# Patient Record
Sex: Female | Born: 1960 | Race: White | Hispanic: No | Marital: Married | Smoking: Former smoker
Health system: Southern US, Community
[De-identification: ages and names within clinical notes are randomized; demographics above are authoritative.]

## PROBLEM LIST (undated history)

## (undated) DIAGNOSIS — Z9221 Personal history of antineoplastic chemotherapy: Secondary | ICD-10-CM

## (undated) DIAGNOSIS — F419 Anxiety disorder, unspecified: Secondary | ICD-10-CM

## (undated) DIAGNOSIS — F4310 Post-traumatic stress disorder, unspecified: Secondary | ICD-10-CM

## (undated) DIAGNOSIS — E039 Hypothyroidism, unspecified: Secondary | ICD-10-CM

## (undated) DIAGNOSIS — Z923 Personal history of irradiation: Secondary | ICD-10-CM

## (undated) DIAGNOSIS — F329 Major depressive disorder, single episode, unspecified: Secondary | ICD-10-CM

## (undated) DIAGNOSIS — J449 Chronic obstructive pulmonary disease, unspecified: Secondary | ICD-10-CM

## (undated) DIAGNOSIS — R112 Nausea with vomiting, unspecified: Secondary | ICD-10-CM

## (undated) DIAGNOSIS — Z9889 Other specified postprocedural states: Secondary | ICD-10-CM

## (undated) DIAGNOSIS — E785 Hyperlipidemia, unspecified: Secondary | ICD-10-CM

## (undated) DIAGNOSIS — Z803 Family history of malignant neoplasm of breast: Secondary | ICD-10-CM

## (undated) DIAGNOSIS — Z800 Family history of malignant neoplasm of digestive organs: Secondary | ICD-10-CM

## (undated) HISTORY — DX: Family history of malignant neoplasm of digestive organs: Z80.0

## (undated) HISTORY — DX: Chronic obstructive pulmonary disease, unspecified: J44.9

## (undated) HISTORY — DX: Hyperlipidemia, unspecified: E78.5

## (undated) HISTORY — DX: Hypothyroidism, unspecified: E03.9

## (undated) HISTORY — DX: Post-traumatic stress disorder, unspecified: F43.10

## (undated) HISTORY — PX: BREAST LUMPECTOMY: SHX2

## (undated) HISTORY — DX: Anxiety disorder, unspecified: F41.9

## (undated) HISTORY — DX: Family history of malignant neoplasm of breast: Z80.3

## (undated) HISTORY — DX: Major depressive disorder, single episode, unspecified: F32.9

## (undated) HISTORY — PX: APPENDECTOMY: SHX54

---

## 1996-01-14 DIAGNOSIS — E039 Hypothyroidism, unspecified: Secondary | ICD-10-CM

## 1996-01-14 HISTORY — DX: Hypothyroidism, unspecified: E03.9

## 1996-10-13 DIAGNOSIS — F329 Major depressive disorder, single episode, unspecified: Secondary | ICD-10-CM

## 1996-10-13 DIAGNOSIS — F419 Anxiety disorder, unspecified: Secondary | ICD-10-CM

## 1996-10-13 DIAGNOSIS — F32A Depression, unspecified: Secondary | ICD-10-CM

## 1996-10-13 HISTORY — DX: Anxiety disorder, unspecified: F41.9

## 1996-10-13 HISTORY — DX: Major depressive disorder, single episode, unspecified: F32.9

## 1996-10-13 HISTORY — DX: Depression, unspecified: F32.A

## 1998-09-15 DIAGNOSIS — E785 Hyperlipidemia, unspecified: Secondary | ICD-10-CM

## 1998-09-15 HISTORY — DX: Hyperlipidemia, unspecified: E78.5

## 1999-10-14 ENCOUNTER — Encounter: Payer: Self-pay | Admitting: Family Medicine

## 1999-10-14 LAB — CONVERTED CEMR LAB: Pap Smear: NORMAL

## 1999-11-04 ENCOUNTER — Other Ambulatory Visit: Admission: RE | Admit: 1999-11-04 | Discharge: 1999-11-04 | Payer: Self-pay | Admitting: Family Medicine

## 2001-01-13 ENCOUNTER — Encounter: Payer: Self-pay | Admitting: Family Medicine

## 2001-01-13 LAB — CONVERTED CEMR LAB: Pap Smear: NORMAL

## 2001-01-15 ENCOUNTER — Other Ambulatory Visit: Admission: RE | Admit: 2001-01-15 | Discharge: 2001-01-15 | Payer: Self-pay | Admitting: Family Medicine

## 2002-01-13 ENCOUNTER — Encounter: Payer: Self-pay | Admitting: Family Medicine

## 2002-01-13 LAB — CONVERTED CEMR LAB: Pap Smear: NORMAL

## 2002-01-17 ENCOUNTER — Other Ambulatory Visit: Admission: RE | Admit: 2002-01-17 | Discharge: 2002-01-17 | Payer: Self-pay | Admitting: Family Medicine

## 2003-03-16 ENCOUNTER — Encounter: Payer: Self-pay | Admitting: Family Medicine

## 2003-03-16 LAB — HM MAMMOGRAPHY: HM Mammogram: NORMAL

## 2003-03-16 LAB — CONVERTED CEMR LAB: Pap Smear: NORMAL

## 2003-03-26 ENCOUNTER — Other Ambulatory Visit: Admission: RE | Admit: 2003-03-26 | Discharge: 2003-03-26 | Payer: Self-pay | Admitting: Family Medicine

## 2004-04-15 ENCOUNTER — Encounter: Payer: Self-pay | Admitting: Family Medicine

## 2004-04-15 LAB — CONVERTED CEMR LAB: Pap Smear: NORMAL

## 2004-04-21 ENCOUNTER — Other Ambulatory Visit: Admission: RE | Admit: 2004-04-21 | Discharge: 2004-04-21 | Payer: Self-pay | Admitting: Family Medicine

## 2004-10-19 ENCOUNTER — Ambulatory Visit: Payer: Self-pay | Admitting: Family Medicine

## 2005-04-21 ENCOUNTER — Ambulatory Visit: Payer: Self-pay | Admitting: Family Medicine

## 2005-08-12 ENCOUNTER — Ambulatory Visit: Payer: Self-pay | Admitting: Family Medicine

## 2005-08-22 ENCOUNTER — Ambulatory Visit: Payer: Self-pay | Admitting: Family Medicine

## 2005-09-14 ENCOUNTER — Ambulatory Visit: Payer: Self-pay | Admitting: Family Medicine

## 2005-09-15 DIAGNOSIS — J449 Chronic obstructive pulmonary disease, unspecified: Secondary | ICD-10-CM

## 2005-09-15 HISTORY — DX: Chronic obstructive pulmonary disease, unspecified: J44.9

## 2005-09-26 ENCOUNTER — Ambulatory Visit: Payer: Self-pay | Admitting: Family Medicine

## 2005-10-10 ENCOUNTER — Ambulatory Visit: Payer: Self-pay | Admitting: Family Medicine

## 2005-10-10 ENCOUNTER — Other Ambulatory Visit: Admission: RE | Admit: 2005-10-10 | Discharge: 2005-10-10 | Payer: Self-pay | Admitting: Family Medicine

## 2005-10-10 ENCOUNTER — Encounter: Payer: Self-pay | Admitting: Family Medicine

## 2005-10-13 ENCOUNTER — Encounter: Payer: Self-pay | Admitting: Family Medicine

## 2005-10-13 LAB — CONVERTED CEMR LAB: Pap Smear: NORMAL

## 2006-03-21 ENCOUNTER — Ambulatory Visit: Payer: Self-pay | Admitting: Family Medicine

## 2006-06-14 ENCOUNTER — Ambulatory Visit: Payer: Self-pay | Admitting: Family Medicine

## 2006-06-29 ENCOUNTER — Ambulatory Visit: Payer: Self-pay | Admitting: Family Medicine

## 2006-07-26 ENCOUNTER — Ambulatory Visit: Payer: Self-pay | Admitting: Family Medicine

## 2006-10-17 ENCOUNTER — Ambulatory Visit: Payer: Self-pay | Admitting: Family Medicine

## 2006-11-07 ENCOUNTER — Encounter: Payer: Self-pay | Admitting: Family Medicine

## 2006-11-07 DIAGNOSIS — F329 Major depressive disorder, single episode, unspecified: Secondary | ICD-10-CM | POA: Insufficient documentation

## 2006-11-07 DIAGNOSIS — E039 Hypothyroidism, unspecified: Secondary | ICD-10-CM | POA: Insufficient documentation

## 2006-11-07 DIAGNOSIS — J449 Chronic obstructive pulmonary disease, unspecified: Secondary | ICD-10-CM | POA: Insufficient documentation

## 2006-11-07 DIAGNOSIS — R519 Headache, unspecified: Secondary | ICD-10-CM | POA: Insufficient documentation

## 2006-11-07 DIAGNOSIS — E785 Hyperlipidemia, unspecified: Secondary | ICD-10-CM | POA: Insufficient documentation

## 2006-11-07 DIAGNOSIS — F411 Generalized anxiety disorder: Secondary | ICD-10-CM | POA: Insufficient documentation

## 2006-11-07 DIAGNOSIS — J4489 Other specified chronic obstructive pulmonary disease: Secondary | ICD-10-CM | POA: Insufficient documentation

## 2006-11-07 DIAGNOSIS — R51 Headache: Secondary | ICD-10-CM

## 2006-11-07 DIAGNOSIS — F32A Depression, unspecified: Secondary | ICD-10-CM | POA: Insufficient documentation

## 2007-01-01 ENCOUNTER — Telehealth (INDEPENDENT_AMBULATORY_CARE_PROVIDER_SITE_OTHER): Payer: Self-pay | Admitting: *Deleted

## 2007-03-25 ENCOUNTER — Inpatient Hospital Stay (HOSPITAL_COMMUNITY): Admission: EM | Admit: 2007-03-25 | Discharge: 2007-03-26 | Payer: Self-pay | Admitting: Emergency Medicine

## 2007-03-25 ENCOUNTER — Ambulatory Visit: Payer: Self-pay | Admitting: Internal Medicine

## 2007-03-26 ENCOUNTER — Encounter: Payer: Self-pay | Admitting: Family Medicine

## 2007-03-29 ENCOUNTER — Encounter (INDEPENDENT_AMBULATORY_CARE_PROVIDER_SITE_OTHER): Payer: Self-pay | Admitting: *Deleted

## 2007-03-29 ENCOUNTER — Telehealth (INDEPENDENT_AMBULATORY_CARE_PROVIDER_SITE_OTHER): Payer: Self-pay | Admitting: Internal Medicine

## 2007-06-18 ENCOUNTER — Telehealth: Payer: Self-pay | Admitting: Family Medicine

## 2007-06-21 ENCOUNTER — Telehealth: Payer: Self-pay | Admitting: Family Medicine

## 2007-06-28 ENCOUNTER — Ambulatory Visit: Payer: Self-pay | Admitting: Family Medicine

## 2007-07-04 LAB — CONVERTED CEMR LAB
ALT: 17 units/L (ref 0–35)
AST: 26 units/L (ref 0–37)
Albumin: 4.2 g/dL (ref 3.5–5.2)
Alkaline Phosphatase: 88 units/L (ref 39–117)
BUN: 10 mg/dL (ref 6–23)
Basophils Absolute: 0.1 10*3/uL (ref 0.0–0.1)
Basophils Relative: 1 % (ref 0–1)
CO2: 28 meq/L (ref 19–32)
Calcium: 9.2 mg/dL (ref 8.4–10.5)
Chloride: 105 meq/L (ref 96–112)
Creatinine, Ser: 0.8 mg/dL (ref 0.40–1.20)
Eosinophils Absolute: 0.1 10*3/uL — ABNORMAL LOW (ref 0.2–0.7)
Eosinophils Relative: 1 % (ref 0–5)
FSH: 31.1 milliintl units/mL
Folate: 17.2 ng/mL
Free T4: 1.13 ng/dL (ref 0.89–1.80)
Glucose, Bld: 72 mg/dL (ref 70–99)
HCT: 41.8 % (ref 36.0–46.0)
Hemoglobin: 13.8 g/dL (ref 12.0–15.0)
Iron: 45 ug/dL (ref 42–145)
LH: 13.1 milliintl units/mL
Lymphocytes Relative: 35 % (ref 12–46)
Lymphs Abs: 1.9 10*3/uL (ref 0.7–4.0)
MCHC: 33 g/dL (ref 30.0–36.0)
MCV: 95.2 fL (ref 78.0–100.0)
Monocytes Absolute: 0.3 10*3/uL (ref 0.1–1.0)
Monocytes Relative: 6 % (ref 3–12)
Neutro Abs: 3.2 10*3/uL (ref 1.7–7.7)
Neutrophils Relative %: 57 % (ref 43–77)
Platelets: 279 10*3/uL (ref 150–400)
Potassium: 4.4 meq/L (ref 3.5–5.3)
Prolactin: 3.7 ng/mL
RBC: 4.39 M/uL (ref 3.87–5.11)
RDW: 12.5 % (ref 11.5–15.5)
Sodium: 141 meq/L (ref 135–145)
T3, Free: 2.8 pg/mL (ref 2.3–4.2)
TSH: 2.091 microintl units/mL (ref 0.350–5.50)
Total Bilirubin: 0.2 mg/dL — ABNORMAL LOW (ref 0.3–1.2)
Total Protein: 7.3 g/dL (ref 6.0–8.3)
Vitamin B-12: 441 pg/mL (ref 211–911)
WBC: 5.5 10*3/uL (ref 4.0–10.5)

## 2007-08-13 ENCOUNTER — Telehealth: Payer: Self-pay | Admitting: Family Medicine

## 2007-08-21 ENCOUNTER — Telehealth: Payer: Self-pay | Admitting: Family Medicine

## 2007-09-18 ENCOUNTER — Telehealth: Payer: Self-pay | Admitting: Family Medicine

## 2007-10-11 ENCOUNTER — Telehealth: Payer: Self-pay | Admitting: Family Medicine

## 2007-11-05 ENCOUNTER — Telehealth: Payer: Self-pay | Admitting: Family Medicine

## 2007-11-30 ENCOUNTER — Telehealth: Payer: Self-pay | Admitting: Family Medicine

## 2007-12-11 ENCOUNTER — Encounter: Payer: Self-pay | Admitting: Family Medicine

## 2007-12-11 ENCOUNTER — Ambulatory Visit: Payer: Self-pay | Admitting: Family Medicine

## 2007-12-11 ENCOUNTER — Other Ambulatory Visit: Admission: RE | Admit: 2007-12-11 | Discharge: 2007-12-11 | Payer: Self-pay | Admitting: Family Medicine

## 2007-12-11 LAB — CONVERTED CEMR LAB: Pap Smear: NORMAL

## 2007-12-12 LAB — CONVERTED CEMR LAB
ALT: 16 units/L (ref 0–35)
AST: 21 units/L (ref 0–37)
Albumin: 4.2 g/dL (ref 3.5–5.2)
Alkaline Phosphatase: 81 units/L (ref 39–117)
BUN: 10 mg/dL (ref 6–23)
Bilirubin, Direct: 0.1 mg/dL (ref 0.0–0.3)
CO2: 22 meq/L (ref 19–32)
Calcium: 8.9 mg/dL (ref 8.4–10.5)
Chloride: 108 meq/L (ref 96–112)
Cholesterol: 183 mg/dL (ref 0–200)
Creatinine, Ser: 0.66 mg/dL (ref 0.40–1.20)
Glucose, Bld: 79 mg/dL (ref 70–99)
HDL: 59 mg/dL (ref >39)
Indirect Bilirubin: 0.3 mg/dL (ref 0.0–0.9)
LDL Cholesterol: 110 mg/dL — ABNORMAL HIGH (ref 0–99)
Potassium: 4 meq/L (ref 3.5–5.3)
Sodium: 141 meq/L (ref 135–145)
TSH: 0.616 microintl units/mL (ref 0.350–5.50)
Total Bilirubin: 0.4 mg/dL (ref 0.3–1.2)
Total CHOL/HDL Ratio: 3.1
Total Protein: 6.9 g/dL (ref 6.0–8.3)
Triglycerides: 72 mg/dL (ref <150)
VLDL: 14 mg/dL (ref 0–40)

## 2007-12-17 ENCOUNTER — Encounter (INDEPENDENT_AMBULATORY_CARE_PROVIDER_SITE_OTHER): Payer: Self-pay | Admitting: *Deleted

## 2007-12-24 ENCOUNTER — Telehealth: Payer: Self-pay | Admitting: Family Medicine

## 2008-01-01 ENCOUNTER — Telehealth: Payer: Self-pay | Admitting: Family Medicine

## 2008-01-29 ENCOUNTER — Telehealth: Payer: Self-pay | Admitting: Family Medicine

## 2008-02-28 ENCOUNTER — Telehealth: Payer: Self-pay | Admitting: Family Medicine

## 2008-04-10 ENCOUNTER — Telehealth: Payer: Self-pay | Admitting: Internal Medicine

## 2008-05-01 ENCOUNTER — Telehealth: Payer: Self-pay | Admitting: Family Medicine

## 2008-05-09 ENCOUNTER — Telehealth: Payer: Self-pay | Admitting: Internal Medicine

## 2008-05-19 ENCOUNTER — Ambulatory Visit: Payer: Self-pay | Admitting: Family Medicine

## 2008-06-13 ENCOUNTER — Telehealth: Payer: Self-pay | Admitting: Internal Medicine

## 2008-06-17 ENCOUNTER — Telehealth: Payer: Self-pay | Admitting: Family Medicine

## 2008-07-01 ENCOUNTER — Telehealth: Payer: Self-pay | Admitting: Family Medicine

## 2008-07-16 ENCOUNTER — Telehealth: Payer: Self-pay | Admitting: Family Medicine

## 2008-08-19 ENCOUNTER — Telehealth: Payer: Self-pay | Admitting: Family Medicine

## 2008-09-10 ENCOUNTER — Telehealth: Payer: Self-pay | Admitting: Family Medicine

## 2008-09-18 ENCOUNTER — Telehealth: Payer: Self-pay | Admitting: Family Medicine

## 2008-10-17 ENCOUNTER — Telehealth: Payer: Self-pay | Admitting: Family Medicine

## 2008-10-24 ENCOUNTER — Telehealth: Payer: Self-pay | Admitting: Family Medicine

## 2008-11-04 ENCOUNTER — Ambulatory Visit: Payer: Self-pay | Admitting: Family Medicine

## 2008-11-04 DIAGNOSIS — N951 Menopausal and female climacteric states: Secondary | ICD-10-CM | POA: Insufficient documentation

## 2008-11-17 ENCOUNTER — Telehealth: Payer: Self-pay | Admitting: Family Medicine

## 2008-12-01 ENCOUNTER — Telehealth: Payer: Self-pay | Admitting: Family Medicine

## 2008-12-17 ENCOUNTER — Telehealth (INDEPENDENT_AMBULATORY_CARE_PROVIDER_SITE_OTHER): Payer: Self-pay | Admitting: *Deleted

## 2008-12-17 ENCOUNTER — Telehealth: Payer: Self-pay | Admitting: Family Medicine

## 2009-03-09 ENCOUNTER — Telehealth: Payer: Self-pay | Admitting: Family Medicine

## 2009-05-05 ENCOUNTER — Telehealth: Payer: Self-pay | Admitting: Family Medicine

## 2009-05-05 ENCOUNTER — Ambulatory Visit: Payer: Self-pay | Admitting: Family Medicine

## 2009-05-05 DIAGNOSIS — N390 Urinary tract infection, site not specified: Secondary | ICD-10-CM | POA: Insufficient documentation

## 2009-05-05 LAB — CONVERTED CEMR LAB
Bilirubin Urine: NEGATIVE
Glucose, Urine, Semiquant: NEGATIVE
Ketones, urine, test strip: NEGATIVE
Nitrite: NEGATIVE
Protein, U semiquant: 100
Specific Gravity, Urine: 1.02
Urobilinogen, UA: 0.2
pH: 6

## 2009-05-06 ENCOUNTER — Encounter: Payer: Self-pay | Admitting: Family Medicine

## 2009-05-07 ENCOUNTER — Ambulatory Visit: Payer: Self-pay | Admitting: Family Medicine

## 2009-05-07 ENCOUNTER — Other Ambulatory Visit: Admission: RE | Admit: 2009-05-07 | Discharge: 2009-05-07 | Payer: Self-pay | Admitting: Family Medicine

## 2009-05-07 ENCOUNTER — Encounter: Payer: Self-pay | Admitting: Family Medicine

## 2009-05-07 DIAGNOSIS — R55 Syncope and collapse: Secondary | ICD-10-CM | POA: Insufficient documentation

## 2009-05-07 LAB — HM PAP SMEAR

## 2009-05-07 LAB — CONVERTED CEMR LAB: Pap Smear: NORMAL

## 2009-05-11 LAB — CONVERTED CEMR LAB
ALT: 12 units/L (ref 0–35)
AST: 18 units/L (ref 0–37)
Albumin: 3.7 g/dL (ref 3.5–5.2)
Alkaline Phosphatase: 98 units/L (ref 39–117)
BUN: 12 mg/dL (ref 6–23)
Basophils Absolute: 0.1 10*3/uL (ref 0.0–0.1)
Basophils Relative: 1 % (ref 0.0–3.0)
Bilirubin, Direct: 0 mg/dL (ref 0.0–0.3)
CO2: 31 meq/L (ref 19–32)
Calcium: 9 mg/dL (ref 8.4–10.5)
Chloride: 103 meq/L (ref 96–112)
Cholesterol: 243 mg/dL — ABNORMAL HIGH (ref 0–200)
Creatinine, Ser: 0.8 mg/dL (ref 0.4–1.2)
Direct LDL: 161.1 mg/dL
Eosinophils Absolute: 0.1 10*3/uL (ref 0.0–0.7)
Eosinophils Relative: 1.6 % (ref 0.0–5.0)
Free T4: 0.8 ng/dL (ref 0.6–1.6)
GFR calc non Af Amer: 81.04 mL/min (ref >60)
Glucose, Bld: 82 mg/dL (ref 70–99)
HCT: 41 % (ref 36.0–46.0)
HDL: 61.1 mg/dL (ref >39.00)
Hemoglobin: 13.6 g/dL (ref 12.0–15.0)
Lymphocytes Relative: 23.3 % (ref 12.0–46.0)
Lymphs Abs: 1.7 10*3/uL (ref 0.7–4.0)
MCHC: 33.3 g/dL (ref 30.0–36.0)
MCV: 95.7 fL (ref 78.0–100.0)
Monocytes Absolute: 0.4 10*3/uL (ref 0.1–1.0)
Monocytes Relative: 5.9 % (ref 3.0–12.0)
Neutro Abs: 4.8 10*3/uL (ref 1.4–7.7)
Neutrophils Relative %: 68.2 % (ref 43.0–77.0)
Platelets: 309 10*3/uL (ref 150.0–400.0)
Potassium: 4.4 meq/L (ref 3.5–5.1)
RBC: 4.28 M/uL (ref 3.87–5.11)
RDW: 12.3 % (ref 11.5–14.6)
Sodium: 138 meq/L (ref 135–145)
TSH: 0.4 microintl units/mL (ref 0.35–5.50)
Total Bilirubin: 0.4 mg/dL (ref 0.3–1.2)
Total CHOL/HDL Ratio: 4
Total Protein: 6.9 g/dL (ref 6.0–8.3)
Triglycerides: 116 mg/dL (ref 0.0–149.0)
VLDL: 23.2 mg/dL (ref 0.0–40.0)
WBC: 7.1 10*3/uL (ref 4.5–10.5)

## 2009-05-18 ENCOUNTER — Telehealth: Payer: Self-pay | Admitting: Family Medicine

## 2009-05-19 ENCOUNTER — Telehealth: Payer: Self-pay | Admitting: Family Medicine

## 2009-06-04 ENCOUNTER — Telehealth: Payer: Self-pay | Admitting: Family Medicine

## 2009-06-11 ENCOUNTER — Telehealth: Payer: Self-pay | Admitting: Family Medicine

## 2009-06-19 ENCOUNTER — Telehealth: Payer: Self-pay | Admitting: Family Medicine

## 2009-06-22 ENCOUNTER — Telehealth: Payer: Self-pay | Admitting: Family Medicine

## 2009-06-23 ENCOUNTER — Encounter (INDEPENDENT_AMBULATORY_CARE_PROVIDER_SITE_OTHER): Payer: Self-pay | Admitting: *Deleted

## 2009-07-17 ENCOUNTER — Encounter: Payer: Self-pay | Admitting: Family Medicine

## 2009-08-28 ENCOUNTER — Encounter: Payer: Self-pay | Admitting: Family Medicine

## 2009-11-17 ENCOUNTER — Telehealth: Payer: Self-pay | Admitting: Family Medicine

## 2009-11-23 ENCOUNTER — Telehealth: Payer: Self-pay | Admitting: Family Medicine

## 2009-11-26 ENCOUNTER — Ambulatory Visit: Payer: Self-pay | Admitting: Family Medicine

## 2009-11-26 LAB — CONVERTED CEMR LAB
Bilirubin Urine: NEGATIVE
Blood in Urine, dipstick: NEGATIVE
Glucose, Urine, Semiquant: NEGATIVE
Ketones, urine, test strip: NEGATIVE
Nitrite: NEGATIVE
Protein, U semiquant: NEGATIVE
Specific Gravity, Urine: 1.015
Urobilinogen, UA: 0.2
pH: 6

## 2009-11-27 ENCOUNTER — Encounter: Payer: Self-pay | Admitting: Family Medicine

## 2009-12-15 ENCOUNTER — Telehealth: Payer: Self-pay | Admitting: Family Medicine

## 2010-01-07 ENCOUNTER — Emergency Department (HOSPITAL_COMMUNITY): Admission: EM | Admit: 2010-01-07 | Discharge: 2010-01-07 | Payer: Self-pay | Admitting: Emergency Medicine

## 2010-03-23 ENCOUNTER — Encounter (INDEPENDENT_AMBULATORY_CARE_PROVIDER_SITE_OTHER): Payer: Self-pay | Admitting: *Deleted

## 2010-03-31 ENCOUNTER — Emergency Department (HOSPITAL_COMMUNITY): Admission: EM | Admit: 2010-03-31 | Discharge: 2010-03-31 | Payer: Self-pay | Admitting: Emergency Medicine

## 2010-06-16 ENCOUNTER — Ambulatory Visit: Payer: Self-pay | Admitting: Family Medicine

## 2010-06-17 LAB — CONVERTED CEMR LAB
Direct LDL: 159.2 mg/dL
Free T4: 0.98 ng/dL (ref 0.60–1.60)
TSH: 0.37 microintl units/mL (ref 0.35–5.50)

## 2010-09-14 NOTE — Progress Notes (Signed)
Summary: please call patient re fiorinal script  Phone Note Call from Patient Call back at Home Phone [phone] Call back at [phone]   Caller: Patient Call For: dr Almedia Cordell Summary of Call: Pt is asking for you to call her regarding her fiorinal prescription. Initial call taken by: Lowella Petties,  June 17, 2008 4:43 PM  Follow-up for Phone Call        Discussed with pt Dr Karle Starch message.Marland KitchenMarland KitchenFiorinal is tricky medicine and is habit forming....we've discussed this before. Try to use as little as possible. Follow-up by: Shaune Leeks MD,  June 18, 2008 7:44 AM

## 2010-09-14 NOTE — Assessment & Plan Note (Signed)
Summary: CPX   Vital Signs:  Patient Profile:   50 Years Old Female Height:     63 inches Weight:      108 pounds Temp:     98.4 degrees F tympanic Pulse rate:   88 / minute Pulse rhythm:   regular BP sitting:   100 / 60  (left arm) Cuff size:   regular  Vitals Entered ByMarland Kitchen Providence Crosby (December 11, 2007 1:57 PM)                 Chief Complaint:  check up// .  History of Present Illness: Here for Comp Exam, hasn't had labs...has stopped therapy due to feeling going over and over just wasn't helpful. Is stressed "to hte gills" with fast pulse rate.  Typicallly has had rates in 90s, hasn't had any chest pain. She has lost a lot of weight and doesn't eat right althoug she is doing better. She is not having nausea and vomiting now. Had been having diarrhea every other day, now improved. Still has occas nervousness but doing better. Her migraines are still there.  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 Does lots with [employer] which allows her to sing a lot...now two years since accident of her son. Having some problems with her mother  driving her crazy...can't handle the controlloing activity of her mother.     Prior Medications Reviewed Using: Patient Recall  Current Allergies (reviewed today): ! * CODIENE   Family History:    Father Unknown    Mother A 41 Htn     1/2Brother A 32  Social History:    ReMarried lives with husb   Risk Factors:  Passive smoke exposure:  no Drug use:  no HIV high-risk behavior:  no Caffeine use:  3 drinks per day Alcohol use:  no Exercise:  no Seatbelt use:  100 %   Review of Systems  General      Complains of weight loss.      probably sec depress and chronic N/V  Eyes      Denies blurring, discharge, double vision, eye irritation, eye pain, halos, itching, light sensitivity, red eye, vision loss-1 eye, and vision loss-both eyes.  ENT      Denies decreased hearing, difficulty swallowing, ear discharge, earache,  hoarseness, nasal congestion, nosebleeds, postnasal drainage, ringing in ears, sinus pressure, and sore throat.  CV      Denies bluish discoloration of lips or nails, chest pain or discomfort, difficulty breathing at night, difficulty breathing while lying down, fainting, fatigue, leg cramps with exertion, lightheadness, near fainting, palpitations, shortness of breath with exertion, swelling of feet, swelling of hands, and weight gain.      tachycardia  Resp      Denies chest discomfort, chest pain with inspiration, cough, coughing up blood, excessive snoring, hypersomnolence, morning headaches, pleuritic, shortness of breath, sputum productive, and wheezing.      allergies  GI      Denies abdominal pain, bloody stools, change in bowel habits, constipation, dark tarry stools, diarrhea, excessive appetite, gas, hemorrhoids, indigestion, loss of appetite, nausea, vomiting, vomiting blood, and yellowish skin color.      resolved diarrhea  GU      Denies abnormal vaginal bleeding, decreased libido, discharge, dysuria, genital sores, hematuria, incontinence, nocturia, urinary frequency, and urinary hesitancy.  MS      Denies joint pain, joint redness, joint swelling, loss of strength, low back pain, mid back pain, muscle aches, muscle ,  cramps, muscle weakness, stiffness, and thoracic pain.  Derm      Denies changes in color of skin, changes in nail beds, dryness, excessive perspiration, flushing, hair loss, insect bite(s), itching, lesion(s), poor wound healing, and rash.  Neuro      Complains of headaches.      feq h/a's daily  migraines around her period.     Impression & Recommendations:  Problem # 1:  HEALTH MAINTENANCE EXAM (ICD-V70.0) Assessment: Comment Only  Problem # 2:  HYPOTHYROIDISM (ICD-244.9) Assessment: Unchanged Will recheck today. Her updated medication list for this problem includes:    Synthroid 88 Mcg Tabs (Levothyroxine sodium) .Marland Kitchen... Take 1 tablet by mouth  once a day  Orders: Venipuncture (32440) TLB-TSH (Thyroid Stimulating Hormone) (84443-TSH)  Labs Reviewed: TSH: 2.091 (06/28/2007)      Problem # 3:  HYPERLIPIDEMIA (ICD-272.4) Assessment: Unchanged Will recheck Orders: Venipuncture (10272) TLB-Hepatic/Liver Function Pnl (80076-HEPATIC) TLB-Lipid Panel (80061-LIPID)  Labs Reviewed: SGOT: 26 (06/28/2007)   SGPT: 17 (06/28/2007)   Problem # 4:  HEADACHE (ICD-784.0) Assessment: Unchanged  Her updated medication list for this problem includes:    Fiorinal 50-325-40 Mg Caps (Butalbital-aspirin-caffeine) .Marland Kitchen... Take 1 capsule by mouth every four hours  Orders: TLB-BMP (Basic Metabolic Panel-BMET) (80048-METABOL)   Problem # 5:  DEPRESSION (ICD-311) Assessment: Improved Cont meds. Her updated medication list for this problem includes:    Diazepam 10 Mg Tabs (Diazepam) .Marland Kitchen... Take 1 tablet by mouth twice a day    Prozac 20 Mg Caps (Fluoxetine hcl) ..... One tab by mouth qd   Problem # 6:  ANXIETY (ICD-300.00) Assessment: Unchanged Stable. Her updated medication list for this problem includes:    Diazepam 10 Mg Tabs (Diazepam) .Marland Kitchen... Take 1 tablet by mouth twice a day    Prozac 20 Mg Caps (Fluoxetine hcl) ..... One tab by mouth qd   Complete Medication List: 1)  Diazepam 10 Mg Tabs (Diazepam) .... Take 1 tablet by mouth twice a day 2)  Fiorinal 50-325-40 Mg Caps (Butalbital-aspirin-caffeine) .... Take 1 capsule by mouth every four hours 3)  Synthroid 88 Mcg Tabs (Levothyroxine sodium) .... Take 1 tablet by mouth once a day 4)  Beconase Aq 42 Mcg/spray Susp (Beclomethasone diprop monohyd) .... Prn 5)  Prozac 20 Mg Caps (Fluoxetine hcl) .... One tab by mouth qd   Patient Instructions: 1)  RTC as needed. 2)  Will report labs tomm via phone tree.    ]

## 2010-09-14 NOTE — Progress Notes (Signed)
  Phone Note Refill Request Message from:  Fax from Pharmacy on September 18, 2007 2:39 PM  Refills Requested: Medication #1:  Benny Lennert 50-325-40 MG CAPS Take 1 capsule by mouth every four hours   Last Refilled: 08/21/2007 #30 AND NO REFILLS   Method Requested: Fax to Local Pharmacy Initial call taken by: Providence Crosby,  September 18, 2007 2:40 PM

## 2010-09-14 NOTE — Letter (Signed)
Summary: Discharge Summary  Discharge Summary   Imported By: Eleonore Chiquito 03/27/2007 10:26:02  _____________________________________________________________________  External Attachment:    Type:   Image     Comment:   External Document  Appended Document: Discharge Summary      Current Allergies: ! * CODIENE  Past Surgical History:    appendectomy 9 yoa    NSVDx1    Abd U/S 10/18/2006 nml    HOSP Atypical CP Depression 8/10-8/11/08        Complete Medication List: 1)  Diazepam 10 Mg Tabs (Diazepam) .... Take 1 tablet by mouth twice a day 2)  Fiorinal 50-325-40 Mg Caps (Butalbital-aspirin-caffeine) .... Take 1 capsule by mouth every four hours 3)  Synthroid 88 Mcg Tabs (Levothyroxine sodium) .... Take 1 tablet by mouth once a day 4)  Beconase Aq 42 Mcg/spray Susp (Beclomethasone diprop monohyd) .... Prn 5)  Prozac 40 Mg Caps (Fluoxetine hcl) .... Take 1 capsule by mouth once a day

## 2010-09-14 NOTE — Progress Notes (Signed)
Summary: Pt having another bladder infections  Phone Note Call from Patient   Caller: Patient Call For: [phone] Reason for Call: Acute Illness Details for Reason: Bladder infection  Summary of Call: Pt called, says bladder infection, was in the office on 2 to 3 weeks ago.  Says she been feeling better, says the feeling is coming back.   pts says she does not feel like coming into the office. Pharmacy [facility] in [place].  Call back # [phone] Initial call taken by: Daine Gip,  May 18, 2009 12:21 PM  Follow-up for Phone Call        Due to frequency of recurrence, would like her to see Urology. Treat infection with Bactrim DS one two times a day for ten days.  Follow-up by: Shaune Leeks MD,  May 18, 2009 12:44 PM  Additional Follow-up for Phone Call Additional follow up Details #1::        Patient notified that rx has been sent to the pharmacy and that Shirlee Limerick will be in touch with her to schedule the appt with the urologist. Additional Follow-up by: Sydell Axon LPN,  May 18, 2009 12:58 PM    Additional Follow-up for Phone Call Additional follow up Details #2::    Appt made with Dr Isabel Caprice on 06/05/2009 at 10:45. Carlton Adam  May 18, 2009 3:32 PM  Follow-up by: Carlton Adam,  May 18, 2009 3:32 PM  New/Updated Medications: BACTRIM DS 800-160 MG TABS (SULFAMETHOXAZOLE-TRIMETHOPRIM) one tab by mouth two times a day Prescriptions: BACTRIM DS 800-160 MG TABS (SULFAMETHOXAZOLE-TRIMETHOPRIM) one tab by mouth two times a day  #20 x 0   Entered and Authorized by:   Shaune Leeks MD   Signed by:   Shaune Leeks MD on 05/18/2009   Method used:   Electronically to        [facility] [address] (retail)       [address]       [place], [place]  [address]       Ph: [phone]       Fax: [phone]   RxID:   [id number]

## 2010-09-14 NOTE — Progress Notes (Signed)
  Phone Note From Pharmacy   Caller: [facility] [place]// [phone] Summary of Call: refill for prozac generic 40 mg #90 with 2 refills faxed to above pharmacy/ signed per dr Bellah Alia Initial call taken by: Providence Crosby,  June 18, 2007 10:35 AM

## 2010-09-14 NOTE — Progress Notes (Signed)
Summary: RX VALIUM  Phone Note Refill Request Message from:  [facility] on Dec 24, 2007 12:18 PM  Refills Requested: Medication #1:  DIAZEPAM 10 MG TABS Take 1 tablet by mouth twice a day   Last Refilled: 11/28/2007 #[facility] [address]// [phone]  Initial call taken by: Providence Crosby,  Dec 24, 2007 12:19 PM  Follow-up for Phone Call        PRESCRIBTION CALLED TO PHARMACY Follow-up by: Providence Crosby,  Dec 24, 2007 1:39 PM      Prescriptions: DIAZEPAM 10 MG TABS (DIAZEPAM) Take 1 tablet by mouth twice a day  #60 x 1   Entered and Authorized by:   Shaune Leeks MD   Signed by:   Shaune Leeks MD on 12/24/2007   Method used:   Printed then faxed to ...         RxID:   [id number]  Shaune Leeks MD  Dec 24, 2007 1:19 PM

## 2010-09-14 NOTE — Progress Notes (Signed)
Summary: Change in Prozac  Phone Note From Pharmacy Call back at [phone]   Caller: [facility] Call For: Dr. Hetty Ely  Summary of Call: Pt has been getting Prozac 40 mg prescribed to take daily.  Pt has been taking it every other day.  Pt now request that it be changed to Prozac 20 mg, one daily.  Pharmacist is calling to request a new rx if this is okay with you. Initial call taken by: Sydell Axon,  September 18, 2007 11:58 AM  Follow-up for Phone Call        Prozac 20mg  once daily 30/12 RF Follow-up by: Shaune Leeks MD,  September 18, 2007 2:04 PM  Additional Follow-up for Phone Call Additional follow up Details #1::        Rx called to pharmacy as instructed. Additional Follow-up by: Sydell Axon,  September 18, 2007 2:59 PM    New/Updated Medications: PROZAC 20 MG  CAPS (FLUOXETINE HCL) one tab by mouth qd   Prescriptions: PROZAC 20 MG  CAPS (FLUOXETINE HCL) one tab by mouth qd  #30 x 12   Entered and Authorized by:   Shaune Leeks MD   Signed by:   Shaune Leeks MD on 09/18/2007   Method used:   Print then Give to Patient   RxID:   [id number]

## 2010-09-14 NOTE — Progress Notes (Signed)
Summary: refill request on fiorinal  Phone Note Refill Request Message from:  Fax from Pharmacy on Dec 17, 2008 8:52 AM  Refills Requested: Medication #1:  FIORINAL 50-325-40 MG CAPS Take 1 capsule by mouth every four hours   Last Refilled: 11/17/2008 #50. [facility] EA#[phone]  Initial call taken by: Silas Sacramento CMA,  Dec 17, 2008 8:53 AM  Follow-up for Phone Call        med called in. Follow-up by: Silas Sacramento CMA,  Dec 17, 2008 2:06 PM      Prescriptions: Benny Lennert 50-325-40 MG CAPS (BUTALBITAL-ASPIRIN-CAFFEINE) Take 1 capsule by mouth every four hours  #50 x 2   Entered and Authorized by:   Shaune Leeks MD   Signed by:   Shaune Leeks MD on 12/17/2008   Method used:   Telephoned to ...       [facility] [address]* (retail)       [address]       [place], [place]  [address]       Ph: [phone]       Fax: [phone]   RxID:   [id number]

## 2010-09-14 NOTE — Progress Notes (Signed)
Summary: ? UTI  Phone Note Call from Patient Call back at Home Phone [phone]   Caller: Patient Call For: Shaune Leeks MD Summary of Call: Pt states she has a UTI, urine is full of blood.  She has an appt with you on thursday for a physical and doesnt want to come in today and again on thursday.  She is asking that an antibiotic be called to [facility] in [place].  She said that you know that she gets these all the time. Initial call taken by: Lowella Petties CMA,  May 05, 2009 10:02 AM  Follow-up for Phone Call        I at least need a urine but would prefer to see the pt. Resistant bugs are becoming a problem. Follow-up by: Shaune Leeks MD,  May 05, 2009 10:26 AM  Additional Follow-up for Phone Call Additional follow up Details #1::        Advised pt, she will come in for urine sample. Additional Follow-up by: Lowella Petties CMA,  May 05, 2009 11:14 AM

## 2010-09-14 NOTE — Progress Notes (Signed)
Summary: pt reports sulfa allergy  Phone Note From Pharmacy   Caller: [facility] [address] [phone]* Summary of Call: Bactrim was sent in for the pt but she reported a sulfa allergy to the pharmacist at [facility], please change to something else. Initial call taken by: Lowella Petties CMA,  May 18, 2009 4:44 PM   New Allergies: ! BACTRIM DS (SULFAMETHOXAZOLE-TRIMETHOPRIM) New/Updated Medications: MACROBID 100 MG CAPS (NITROFURANTOIN MONOHYD MACRO) one tab by mouth two times a day New Allergies: ! BACTRIM DS (SULFAMETHOXAZOLE-TRIMETHOPRIM)Prescriptions: MACROBID 100 MG CAPS (NITROFURANTOIN MONOHYD MACRO) one tab by mouth two times a day  #20 x 0   Entered and Authorized by:   Shaune Leeks MD   Signed by:   Shaune Leeks MD on 05/18/2009   Method used:   Electronically to        [facility] [address]* (retail)       [address]       [place], [place]  [address]       Ph: [phone]       Fax: [phone]   RxID:   [id number]

## 2010-09-14 NOTE — Progress Notes (Signed)
Summary: refill fiorinal  Phone Note Refill Request Message from:  [facility] on September 18, 2008 12:29 PM  Refills Requested: Medication #1:  FIORINAL 50-325-40 MG CAPS Take 1 capsule by mouth every four hours   Last Refilled: 08/19/2008 [facility] [address]  [phone]  Initial call taken by: Providence Crosby,  September 18, 2008 12:29 PM  Follow-up for Phone Call        patient prescrib called to pharmacy Follow-up by: Providence Crosby,  September 18, 2008 3:03 PM      Prescriptions: Benny Lennert 45-409-81 MG CAPS (BUTALBITAL-ASPIRIN-CAFFEINE) Take 1 capsule by mouth every four hours  #50 x 0   Entered and Authorized by:   Shaune Leeks MD   Signed by:   Shaune Leeks MD on 09/18/2008   Method used:   Telephoned to ...       [facility] [address]* (retail)       [address]       [place], [place]  [address]       Ph: [phone]       Fax: [phone]   RxID:   [id number]

## 2010-09-14 NOTE — Letter (Signed)
Summary: Results Follow up Letter  [place] at [facility]  [address] [place], [place] [address]   Phone: [phone]  Fax: [phone]    06/23/2009 MRN: [id number]  Jessica Barnes [address] [place], [place]  [address]  Dear Ms. Escoto,  The following are the results of your recent test(s):  Test         Result    Pap Smear:        Normal __X___  Not Normal _____ Comments:Please repeat in one year. ______________________________________________________ Cholesterol: LDL(Bad cholesterol):         Your goal is less than:         HDL (Good cholesterol):       Your goal is more than: Comments:  ______________________________________________________ Mammogram:        Normal _____  Not Normal _____ Comments:  ___________________________________________________________________ Hemoccult:        Normal _____  Not normal _______ Comments:    _____________________________________________________________________ Other Tests:    We routinely do not discuss normal results over the telephone.  If you desire a copy of the results, or you have any questions about this information we can discuss them at your next office visit.   Sincerely,     Laurita Quint, MD

## 2010-09-14 NOTE — Progress Notes (Signed)
Summary: refill fiorinal  Phone Note Refill Request Message from:  Fax from Pharmacy on August 19, 2008 3:37 PM  Refills Requested: Medication #1:  Jessica Barnes 50-325-40 MG CAPS Take 1 capsule by mouth every four hours   Last Refilled: 07/16/[facility] [address] [phone] CARE OF ALREADY  Initial call taken by: Providence Crosby,  August 19, 2008 3:38 PM

## 2010-09-14 NOTE — Progress Notes (Signed)
Summary: has another UTI  Phone Note Call from Patient Call back at Home Phone [phone]   Caller: Patient Call For: Shaune Leeks MD Summary of Call: Pt states she has another UTI and is asking that something be called to [facility] in [place].  She didnt go to her urology appt because she had to have oral surgery done.  She is taking amox now prior to having a root canal next week.  She said an infection wont show up on the urinalysis because she is taking the amox.  She has burning with urination, urgency, discomfort.  Also, she is asking if she can switch from fiorinal to fioricet to avoid the asa content, she says she had discussed this with you at last visit. Initial call taken by: Lowella Petties CMA,  June 11, 2009 11:26 AM  Follow-up for Phone Call        Will prescribe Cipro  two times a day for ten days for UTI (Since she just had Marcobid at the beginning of the month) and I EXPECT HER TO GO TO UROLOGY WITHIN THE MONTH. She was given Fioricet last time. Will refill with Fioricet, but not due until 4 Nov.  Follow-up by: Shaune Leeks MD,  June 11, 2009 1:38 PM  Additional Follow-up for Phone Call Additional follow up Details #1::        Advised pt.  She said the pharmacy did give her fiorinal last time, she says that could have been their mistake. Additional Follow-up by: Lowella Petties CMA,  June 11, 2009 1:47 PM    New/Updated Medications: CIPROFLOXACIN HCL 500 MG TABS (CIPROFLOXACIN HCL) one tab by mouth two times a day for ten days for recurrent UTI Prescriptions: CIPROFLOXACIN HCL 500 MG TABS (CIPROFLOXACIN HCL) one tab by mouth two times a day for ten days for recurrent UTI  #20 x 0   Entered and Authorized by:   Shaune Leeks MD   Signed by:   Shaune Leeks MD on 06/11/2009   Method used:   Electronically to        [facility] [address]* (retail)       [address]       [place], [place]  [address]       Ph:  [phone]       Fax: [phone]   RxID:   [id number]

## 2010-09-14 NOTE — Letter (Signed)
Summary: Jessica Barnes letter  [place] at [facility]  [address] [place], [place] [address]   Phone: [phone]  Fax: [phone]       03/23/2010 MRN: [id number]  Jessica Barnes [address] [place], [place]  [address]  Dear Ms. Jessica [facility] [place] [place] announce the retirement of Arta Silence, M.D., from full-time practice at the [facility] office effective February 11, 2010 and his plans of returning part-time.  It is important to Dr. Hetty Ely and to our practice that you understand that [facility] has seven physicians in our office for your health care needs.  We will continue to offer the same exceptional care that you have today.    Dr. Hetty Ely has spoken to many of you about his plans for retirement and returning part-time in the fall.   We will continue to work with you through the transition to schedule appointments for you in the office and meet the high standards that  is committed to.   Again, it is with great pleasure that we share the news that Dr. Hetty Ely will return to [facility] at [facility] in October of 2011 with a reduced schedule.    If you have any questions, or would like to request an appointment with one of our physicians, please call us at [phone] and press the option for Scheduling an appointment.  We take pleasure in providing you with excellent patient care and look forward to seeing you at your next office visit.  Our [facility] Physicians are:  Tillman Abide, M.D. Laurita Quint, M.D. Roxy Manns, M.D. Kerby Nora, M.D. Hannah Beat, M.D. Ruthe Mannan, M.D. We proudly welcomed Raechel Ache, M.D. and Eustaquio Boyden, M.D. to the practice in July/August 2011.  Sincerely,   [facility]

## 2010-09-14 NOTE — Assessment & Plan Note (Signed)
Summary: PERSONAL/CLE   Vital Signs:  Patient Profile:   50 Years Old Female Height:     63 inches Weight:      110 pounds Temp:     98.3 degrees F oral Pulse rate:   60 / minute Pulse rhythm:   regular BP sitting:   100 / 60  (left arm) Cuff size:   regular  Vitals Entered By: Providence Crosby (May 19, 2008 12:21 PM)                 Chief Complaint:  personnal.  History of Present Illness: Pt here for Anxiety, having a bit of relapse from  posttraumatic stress disorder and will be seen Wednesday and feels she needs medication change. She is either very anxiuos or very low on her  Prozac. She takes Xanax when gets anxious and then has episodes of being real down. (Is going to be a grandmother) She goes throught periods of anger not being able to get over this on her own. There are days she doesn't get out of bed, others when she doesn't do much. Her husband has been working a lot but had yesterday free and the two of them got to spend time together so yesterday was a good day. She is not trustful enough to see a psychiatrist and wants to change/adjust meds. She has been on Prozac for a long time and is invested in that,  she is possibly not trustful enough to try something else but is willing to possibly add something.    Prior Medications Reviewed Using: Patient Recall  Current Allergies (reviewed today): ! * CODIENE      Physical Exam  General:     Well-developed,well-nourished,in no acute distress; alert,appropriate and cooperative throughout examination Head:     Normocephalic and atraumatic without obvious abnormalities. No apparent alopecia or balding. Eyes:     Conjunctiva clear bilaterally.  Ears:     External ear exam shows no significant lesions or deformities.  Otoscopic examination reveals clear canals, tympanic membranes are intact bilaterally without bulging, retraction, inflammation or discharge. Hearing is grossly normal bilaterally. Nose:  External nasal examination shows no deformity or inflammation. Nasal mucosa are pink and moist without lesions or exudates. Mouth:     Oral mucosa and oropharynx without lesions or exudates.  Teeth in good repair. Neck:     No deformities, masses, or tenderness noted. Chest Wall:     No deformities, masses, or tenderness noted. Lungs:     Normal respiratory effort, chest expands symmetrically. Lungs are clear to auscultation, no crackles or wheezes. Heart:     Normal rate and regular rhythm. S1 and S2 normal without gallop, murmur, click, rub or other extra sounds. Abdomen:     Bowel sounds positive,abdomen soft and non-tender without masses, organomegaly or hernias noted.    Impression & Recommendations:  Problem # 1:  DEPRESSION (ICD-311) Assessment: Deteriorated Will try adding Wellbutrin. Her updated medication list for this problem includes:    Diazepam 10 Mg Tabs (Diazepam) .Marland Kitchen... Take 1 tablet by mouth twice a day    Prozac 20 Mg Caps (Fluoxetine hcl) ..... One tab by mouth qd    Wellbutrin 75 Mg Tabs (Bupropion hcl) ..... One tab by mouth once a day for one week and then twice a day.   Problem # 2:  ANXIETY (ICD-300.00) Assessment: Unchanged Contiunue Diazepam as needed. Her updated medication list for this problem includes:    Diazepam 10 Mg Tabs (Diazepam) .Marland KitchenMarland KitchenMarland KitchenMarland Kitchen  Take 1 tablet by mouth twice a day    Prozac 20 Mg Caps (Fluoxetine hcl) ..... One tab by mouth qd    Wellbutrin 75 Mg Tabs (Bupropion hcl) ..... One tab by mouth once a day for one week and then twice a day.   Problem # 3:  HYPOTHYROIDISM (ICD-244.9) Assessment: Unchanged Will recheck next time. Her updated medication list for this problem includes:    Synthroid 88 Mcg Tabs (Levothyroxine sodium) .Marland Kitchen... Take 1 tablet by mouth once a day  Labs Reviewed: TSH: 0.616 (12/11/2007)    Chol: 183 (12/11/2007)   HDL: 59 (12/11/2007)   LDL: 110 (12/11/2007)   TG: 72 (12/11/2007)   Complete Medication List: 1)   Diazepam 10 Mg Tabs (Diazepam) .... Take 1 tablet by mouth twice a day 2)  Fiorinal 50-325-40 Mg Caps (Butalbital-aspirin-caffeine) .... Take 1 capsule by mouth every four hours 3)  Synthroid 88 Mcg Tabs (Levothyroxine sodium) .... Take 1 tablet by mouth once a day 4)  Beconase Aq 42 Mcg/spray Susp (Beclomethasone diprop monohyd) .... Prn 5)  Prozac 20 Mg Caps (Fluoxetine hcl) .... One tab by mouth qd 6)  Wellbutrin 75 Mg Tabs (Bupropion hcl) .... One tab by mouth once a day for one week and then twice a day.   Patient Instructions: 1)  RTC 3 weeks. 2)  Start counselling as scheduled   Prescriptions: WELLBUTRIN 75 MG TABS (BUPROPION HCL) one tab by mouth once a day for one week and then twice a day.  #60 x 12   Entered and Authorized by:   Shaune Leeks MD   Signed by:   Shaune Leeks MD on 05/19/2008   Method used:   Print then Give to Patient   RxID:   [id number]  ]

## 2010-09-14 NOTE — Assessment & Plan Note (Signed)
Summary: CPX//CYD WANTS LABS DONE AT THIS TIME   Vital Signs:  Patient profile:   50 year old female Height:      63.5 inches Weight:      122 pounds Temp:     98.4 degrees F oral Pulse rate:   64 / minute Pulse rhythm:   regular BP sitting:   100 / 70  (left arm) Cuff size:   regular  Vitals Entered By: Sydell Axon LPN (May 07, 2009 9:30 AM) CC: 30 Minute checkup with pap   History of Present Illness: Pt here for Comp Exam, having sifgnificant bladder irritation from UTI. She otherwise is doing ok. Continues with headaches.  Finally got ordained and has done a wedding.   Preventive Screening-Counseling & Management  Alcohol-Tobacco     Alcohol drinks/day: 0     Smoking Status: quit     Year Quit: 09/13/2007     Pack years: 5     Passive Smoke Exposure: no  Caffeine-Diet-Exercise     Caffeine use/day: 3     Does Patient Exercise: no  Problems Prior to Update: 1)  Uti  (ICD-599.0) 2)  Perimenopausal Status  (ICD-V49.81) 3)  Health Maintenance Exam  (ICD-V70.0) 4)  Hypothyroidism  (ICD-244.9) 5)  Hyperthyroidism  (ICD-242.90) 6)  Hyperlipidemia  (ICD-272.4) 7)  Headache  (ICD-784.0) 8)  Depression  (ICD-311) 9)  COPD  (ICD-496) 10)  Anxiety  (ICD-300.00)  Medications Prior to Update: 1)  Diazepam 10 Mg Tabs (Diazepam) .... Take 1 Tablet By Mouth Twice A Day 2)  Fiorinal 50-325-40 Mg Caps (Butalbital-Aspirin-Caffeine) .... Take 1 Capsule By Mouth Every Four Hours 3)  Synthroid 88 Mcg  Tabs (Levothyroxine Sodium) .... Take 1 Tablet By Mouth Once A Day 4)  Beconase Aq 42 Mcg/spray  Susp (Beclomethasone Diprop Monohyd) .... Prn 5)  Prozac 40 Mg Caps (Fluoxetine Hcl) .... One Tab By Mouth Once Daily 6)  Ciprofloxacin Hcl 500 Mg Tabs (Ciprofloxacin Hcl) .... One Tab By Mouth Two Times A Day  Allergies: 1)  ! * Codiene  Past History:  Past Medical History: Last updated: 11/07/2006 Anxiety 10/13/1996 COPD by XRay 09/15/2005 Depression 10/13/1996 Headache  migaine 27 yoa Hyperlipidemia 09/15/1998 Hypothyroidism 01/14/1996  Past Surgical History: Last updated: 04/01/2007 appendectomy 9 yoa NSVDx1 Abd U/S 10/18/2006 nml HOSP Atypical CP Depression 8/10-8/11/08  Family History: Last updated: 12/11/2007 Father Unknown Mother A 39 Htn  1/2Brother A 32  Social History: Last updated: 12/11/2007 ReMarried lives with husb  Risk Factors: Alcohol Use: 0 (05/07/2009) Caffeine Use: 3 (05/07/2009) Exercise: no (05/07/2009)  Risk Factors: Smoking Status: quit (05/07/2009) Passive Smoke Exposure: no (05/07/2009)  Review of Systems General:  Complains of loss of appetite and weakness; denies chills, fatigue, fever, malaise, sleep disorder, sweats, and weight loss; mild off and on.. Eyes:  Complains of blurring; denies discharge, double vision, eye irritation, eye pain, halos, itching, light sensitivity, red eye, vision loss-1 eye, and vision loss-both eyes; now needs reading glasses.. ENT:  Denies decreased hearing, difficulty swallowing, ear discharge, earache, hoarseness, nasal congestion, nosebleeds, postnasal drainage, ringing in ears, sinus pressure, and sore throat. CV:  Denies bluish discoloration of lips or nails, chest pain or discomfort, difficulty breathing at night, difficulty breathing while lying down, fainting, fatigue, leg cramps with exertion, lightheadness, near fainting, palpitations, shortness of breath with exertion, swelling of feet, swelling of hands, and weight gain. Resp:  Complains of cough; denies chest discomfort, chest pain with inspiration, coughing up blood, excessive snoring, hypersomnolence, morning headaches, pleuritic,  shortness of breath, sputum productive, and wheezing; chronic. GI:  Denies abdominal pain, bloody stools, change in bowel habits, constipation, dark tarry stools, diarrhea, excessive appetite, gas, hemorrhoids, indigestion, loss of appetite, nausea, vomiting, vomiting blood, and yellowish skin color;  has IBS, goes from diarrhea to constipation.. GU:  Complains of dysuria and urinary frequency; acute UTI presently. MS:  Complains of joint pain; denies joint redness, joint swelling, loss of strength, low back pain, mid back pain, muscle aches, muscle , cramps, muscle weakness, stiffness, and thoracic pain; knees chronically.. Derm:  Denies changes in color of skin, changes in nail beds, dryness, excessive perspiration, flushing, hair loss, insect bite(s), itching, lesion(s), poor wound healing, and rash. Neuro:  Denies brief paralysis, difficulty with concentration, disturbances in coordination, falling down, headaches, inability to speak, memory loss, numbness, poor balance, seizures, sensation of room spinning, tingling, tremors, visual disturbances, and weakness.  Physical Exam  General:  Well-developed,well-nourished,in no acute distress; alert,appropriate and cooperative throughout examination Head:  Normocephalic and atraumatic without obvious abnormalities. No apparent alopecia or balding. Eyes:  Conjunctiva clear bilaterally.  Ears:  External ear exam shows no significant lesions or deformities.  Otoscopic examination reveals clear canals, tympanic membranes are intact bilaterally without bulging, retraction, inflammation or discharge. Hearing is grossly normal bilaterally. Nose:  External nasal examination shows no deformity or inflammation. Nasal mucosa are pink and moist without lesions or exudates. Mouth:  Oral mucosa and oropharynx without lesions or exudates.  Teeth in good repair. Neck:  No deformities, masses, or tenderness noted. Chest Wall:  No deformities, masses, or tenderness noted. Breasts:  No mass, nodules, thickening, tenderness, bulging, retraction, inflamation, nipple discharge or skin changes noted.   Lungs:  Normal respiratory effort, chest expands symmetrically. Lungs are clear to auscultation, no crackles or wheezes. Heart:  Normal rate and regular rhythm. S1 and S2  normal without gallop, murmur, click, rub or other extra sounds. Abdomen:  Bowel sounds positive,abdomen soft and non-tender without masses, organomegaly or hernias noted. No suprapubic tenderness. Rectal:  No external abnormalities noted. Normal sphincter tone. No rectal masses or tenderness. G neg. Genitalia:  Pelvic Exam:        External: normal female genitalia without lesions or masses        Vagina: normal without lesions or masses        Cervix: normal without lesions or masses        Adnexa: normal bimanual exam without masses or fullness        Uterus: normal by palpation        Pap smear: performed Msk:  No deformity or scoliosis noted of thoracic or lumbar spine.   Pulses:  R and L carotid,radial,femoral,dorsalis pedis and posterior tibial pulses are full and equal bilaterally Extremities:  No clubbing, cyanosis, edema, or deformity noted with normal full range of motion of all joints.   Neurologic:  No cranial nerve deficits noted. Station and gait are normal. Plantar reflexes are down-going bilaterally. DTRs are symmetrical throughout. Sensory, motor and coordinative functions appear intact. Skin:  Intact without suspicious lesions or rashes Cervical Nodes:  No lymphadenopathy noted Axillary Nodes:  No palpable lymphadenopathy Inguinal Nodes:  No significant adenopathy Psych:  Cognition and judgment appear intact. Alert and cooperative with normal attention span and concentration. No apparent delusions, illusions, hallucinations   Impression & Recommendations:  Problem # 1:  HEALTH MAINTENANCE EXAM (ICD-V70.0)  Problem # 2:  HEADACHE (ICD-784.0) Assessment: Unchanged Still a big problem. Requests referral to [facility]. Her updated medication  list for this problem includes:    Fioricet 50-325-40 Mg Tabs (Butalbital-apap-caffeine) .Marland Kitchen... 1 tab by mouth every 4 hrs as needed  for headache    Tylenol Extra Strength 500 Mg Tabs (Acetaminophen) .Marland Kitchen... As needed  Orders: Neurology  Referral (Neuro) TLB-BMP (Basic Metabolic Panel-BMET) (80048-METABOL) TLB-CBC Platelet - w/Differential (85025-CBCD) TLB-Hepatic/Liver Function Pnl (80076-HEPATIC) TLB-TSH (Thyroid Stimulating Hormone) (84443-TSH)  Problem # 3:  HYPOTHYROIDISM (ICD-244.9) Assessment: Unchanged Will recheck today via labs...missed her appt. Her updated medication list for this problem includes:    Synthroid 88 Mcg Tabs (Levothyroxine sodium) .Marland Kitchen... Take 1 tablet by mouth once a day  Orders: TLB-TSH (Thyroid Stimulating Hormone) (84443-TSH) TLB-T4 (Thyrox), Free [phone])  Labs Reviewed: TSH: 0.616 (12/11/2007)    Chol: 183 (12/11/2007)   HDL: 59 (12/11/2007)   LDL: 110 (12/11/2007)   TG: 72 (12/11/2007)  Problem # 4:  UTI (ICD-599.0) Assessment: Improved Improving, cont Cipro, Culture confirms sensitivity. Her updated medication list for this problem includes:    Ciprofloxacin Hcl 500 Mg Tabs (Ciprofloxacin hcl) ..... One tab by mouth two times a day  Problem # 5:  HYPERLIPIDEMIA (ICD-272.4) Assessment: Unchanged Will recheck today fasting. Orders: Venipuncture (40981) TLB-Lipid Panel (80061-LIPID)  Problem # 6:  DEPRESSION (ICD-311) Assessment: Unchanged Stable. Her updated medication list for this problem includes:    Diazepam 10 Mg Tabs (Diazepam) .Marland Kitchen... Take 1 tablet by mouth twice a day    Prozac 40 Mg Caps (Fluoxetine hcl) ..... One tab by mouth once daily  Problem # 7:  ANXIETY (ICD-300.00) Assessment: Unchanged Stable, has her ups and downs. Her updated medication list for this problem includes:    Diazepam 10 Mg Tabs (Diazepam) .Marland Kitchen... Take 1 tablet by mouth twice a day    Prozac 40 Mg Caps (Fluoxetine hcl) ..... One tab by mouth once daily  Complete Medication List: 1)  Diazepam 10 Mg Tabs (Diazepam) .... Take 1 tablet by mouth twice a day 2)  Fioricet 50-325-40 Mg Tabs (Butalbital-apap-caffeine) .Marland Kitchen.. 1 tab by mouth every 4 hrs as needed  for headache 3)  Synthroid 88 Mcg  Tabs (Levothyroxine sodium) .... Take 1 tablet by mouth once a day 4)  Beconase Aq 42 Mcg/spray Susp (Beclomethasone diprop monohyd) .... Use one spray daily as needed 5)  Prozac 40 Mg Caps (Fluoxetine hcl) .... One tab by mouth once daily 6)  Ciprofloxacin Hcl 500 Mg Tabs (Ciprofloxacin hcl) .... One tab by mouth two times a day 7)  Tylenol Extra Strength 500 Mg Tabs (Acetaminophen) .... As needed  Patient Instructions: 1)  Refer to Neurology at [facility]. 2)  RTC as needed. 3)  Will report labs via [place].  Current Allergies (reviewed today): ! * CODIENE

## 2010-09-14 NOTE — Progress Notes (Signed)
Summary: refill request for diazepam  Phone Note Refill Request Message from:  Fax from Pharmacy  Refills Requested: Medication #1:  DIAZEPAM 10 MG TABS Take 1 tablet by mouth twice a day   Last Refilled: 10/23/2009 Faxed request from [facility], phone [phone].  Initial call taken by: Lowella Petties CMA,  November 23, 2009 3:41 PM  Follow-up for Phone Call        Called to [facility]. Follow-up by: Lowella Petties CMA,  November 23, 2009 5:12 PM    Prescriptions: DIAZEPAM 10 MG TABS (DIAZEPAM) Take 1 tablet by mouth twice a day  #60 x 5   Entered and Authorized by:   Shaune Leeks MD   Signed by:   Shaune Leeks MD on 11/23/2009   Method used:   Telephoned to ...       [facility] [address] (retail)       [address]       [place], [place]  [address]       Ph: [phone]       Fax: [phone]   RxID:   [id number]

## 2010-09-14 NOTE — Progress Notes (Signed)
Summary: uti symptoms  Phone Note Call from Patient Call back at Home Phone [phone]   Caller: Patient Call For: Shaune Leeks MD Summary of Call: Patient says that yesterday she started having burning while urinating and feeling very uncomftorable. She said this morning the symptoms were a little worse so she used at home test strips and it was positive for uti. She wants to know if she can have Macrobid called in to [facility] [address]. She is also asking about referall to Urologist. Please advise.  Initial call taken by: Melody Comas,  Dec 15, 2009 10:58 AM  Follow-up for Phone Call        She just had Macrobid, don't want to use again that quickly for risk of resistance. Use Keflex. Drop off urine here for culture before she starts antibiotic. ABSOLUTELY needs Urology workup. Pls do it all.  Patient notified of rx and will be coming by for urine sample.  Melody Comas  Dec 15, 2009 1:09 PM   Additional Follow-up for Phone Call Additional follow up Details #1::        [facility] Urology on 01/04/2010 at 1:15pm. Additional Follow-up by: Carlton Adam,  Dec 16, 2009 3:54 PM    New/Updated Medications: KEFLEX 250 MG CAPS (CEPHALEXIN) one tab by mouth 4 times a day. PYRIDIUM 200 MG TABS (PHENAZOPYRIDINE HCL) one tab by mouth three times a day Prescriptions: PYRIDIUM 200 MG TABS (PHENAZOPYRIDINE HCL) one tab by mouth three times a day  #15 x 0   Entered and Authorized by:   Shaune Leeks MD   Signed by:   Shaune Leeks MD on 12/15/2009   Method used:   Electronically to        [facility] [address] (retail)       [address]       [place], [place]  [address]       Ph: [phone]       Fax: [phone]   RxID:   [id number] KEFLEX 250 MG CAPS (CEPHALEXIN) one tab by mouth 4 times a day.  #40 x 0   Entered and Authorized by:   Shaune Leeks MD   Signed by:   Shaune Leeks MD on 12/15/2009   Method used:    Electronically to        [facility] [address] (retail)       [address]       [place], [place]  [address]       Ph: [phone]       Fax: [phone]   RxID:   [id number]

## 2010-09-14 NOTE — Progress Notes (Signed)
Summary: Fiorinal  Phone Note Refill Request Message from:  Fax from Pharmacy on June 19, 2009 1:03 PM  Refills Requested: Medication #1:  FIORICET 50-325-40 MG TABS 1 tab by mouth every 4 hrs as needed  for headache [facility], [place]  Phone:   [phone]   Method Requested: Telephone to Pharmacy Initial call taken by: Delilah Shan CMA (AAMA),  June 19, 2009 1:03 PM  Follow-up for Phone Call        got #60 just 1 month ago This drug causes rebound headaches when taken too frequently This will have to wait till next week for Dr Hetty Ely to review Follow-up by: Cindee Salt MD,  June 19, 2009 3:24 PM  Additional Follow-up for Phone Call Additional follow up Details #1::        Spoke with patient and advised results. Pt states she only got #50, and pt states nobody can tell her about her headaches. I told her what Dr. Alphonsus Sias said, and I called the pharmacy and he said she only got 50, and he will give her the additional 10. Spoke with pt again and advised results. DeShannon Katrinka Blazing CMA Duncan Dull)  June 19, 2009 3:31 PM     Additional Follow-up for Phone Call Additional follow up Details #2::    Noted. Follow-up by: Shaune Leeks MD,  June 19, 2009 4:59 PM

## 2010-09-14 NOTE — Progress Notes (Signed)
  Phone Note From Pharmacy   Caller: Jessica Barnes [phone] Summary of Call: rx signed by dr Consuello Lassalle for fiorinal #30 with3 refills faxed to [facility] Initial call taken by: Providence Crosby,  June 21, 2007 1:55 PM

## 2010-09-14 NOTE — Progress Notes (Signed)
Summary: rx valium  Phone Note Refill Request Call back at [facility] [phone] Message from:  [employer] on November 05, 2007 12:26 PM  Refills Requested: Medication #1:  DIAZEPAM 10 MG TABS Take 1 tablet by mouth twice a day   Last Refilled: 10/01/2007 #60// prescription called into [facility] [address]   Method Requested: called into pharmacy Initial call taken by: Providence Crosby,  November 05, 2007 12:27 PM      Prescriptions: DIAZEPAM 10 MG TABS (DIAZEPAM) Take 1 tablet by mouth twice a day  #60 x 1   Entered and Authorized by:   Shaune Leeks MD   Signed by:   Shaune Leeks MD on 11/05/2007   Method used:   Electronically sent to ...       [address]       [address]       [place], [place]  [address]       Ph: [phone]       Fax: [phone]   RxID:   [id number]

## 2010-09-14 NOTE — Assessment & Plan Note (Signed)
Summary: NEEDS THYROID CHECKED/DLO   Vital Signs:  Patient profile:   50 year old female Weight:      126 pounds Temp:     97.8 degrees F oral Pulse rate:   76 / minute Pulse rhythm:   regular BP sitting:   118 / 72  (left arm) Cuff size:   regular  Vitals Entered By: Sydell Axon LPN (June 16, 2010 3:14 PM) CC:  Her last Fior   History of Present Illness: Pt here for recheck. She has begun menpopause and is now going to a hormone clinic in [place] and is getting Estradiol and testosterone combo implants  and then takes progesterone orally. She had bloodwork done and her hormones were "Depleted." This all happened on her husband's birthday during active relations which were very painful and triggered going to the clinic. The [facility] clinic [place] and gets implants monthly and it is expensive...because of the biohormone. She is again having periods. She has slightly more energy. Her friends who go there feel much better(normal again)...significantly better than she does. She is not having hot flashes anymore. She has all the sxs listed for hormone depletion (a long list on the clinic wall) Her headaches continue.  She has been to [facility] to see Neurology for headaches...she was seen twice and given fioricet. She was also put on multiple antidepressants. She got her last Fioricet refill for 90 pills a few days ago. She is using 90 a month! She is not bothered by that much Fioricet but will not take more than 4 a day. If she takes 4 and still has her headache, she realizes that more Fioricet will not help.  She says the Neurology Clinic  was basically a pain clinic.  Her son is on Cymbalta but she has trembling with it. She also needs a script for Valium. She does not take this regularly. She actually allows others to use this medication, her husband has palpitations and sometimes finds relief with the Valium, altho "it makes him talk a lot."  Problems Prior to Update: 1)  Syncope   (ICD-780.2) 2)  Uti  (ICD-599.0) 3)  Perimenopausal Status  (ICD-V49.81) 4)  Health Maintenance Exam  (ICD-V70.0) 5)  Hypothyroidism  (ICD-244.9) 6)  Hyperlipidemia  (ICD-272.4) 7)  Headache  (ICD-784.0) 8)  Depression  (ICD-311) 9)  COPD  (ICD-496) 10)  Anxiety  (ICD-300.00)  Medications Prior to Update: 1)  Diazepam 10 Mg Tabs (Diazepam) .... Take 1 Tablet By Mouth Twice A Day 2)  Fioricet 50-325-40 Mg Tabs (Butalbital-Apap-Caffeine) .Marland Kitchen.. 1 Tab By Mouth Every 4 Hrs As Needed  For Headache 3)  Synthroid 88 Mcg  Tabs (Levothyroxine Sodium) .... Take 1 Tablet By Mouth Once A Day 4)  Beconase Aq 42 Mcg/spray  Susp (Beclomethasone Diprop Monohyd) .... Use One Spray Daily As Needed 5)  Prozac 40 Mg Caps (Fluoxetine Hcl) .... One Tab By Mouth Once Daily 6)  Tylenol Extra Strength 500 Mg Tabs (Acetaminophen) .... As Needed 7)  Macrobid 100 Mg Caps (Nitrofurantoin Monohyd Macro) .... One Tab By Mouth Two Times A Day 8)  Keflex 250 Mg Caps (Cephalexin) .... One Tab By Mouth 4 Times A Day. 9)  Pyridium 200 Mg Tabs (Phenazopyridine Hcl) .... One Tab By Mouth Three Times A Day  Allergies: 1)  ! * Codiene 2)  ! Bactrim Ds (Sulfamethoxazole-Trimethoprim)  Physical Exam  General:  Well-developed,well-nourished,in no acute distress; alert,appropriate and cooperative throughout examination Head:  Normocephalic and  atraumatic without obvious abnormalities. No apparent alopecia or balding. Eyes:  Conjunctiva clear bilaterally.  Ears:  External ear exam shows no significant lesions or deformities.  Otoscopic examination reveals clear canals, tympanic membranes are intact bilaterally without bulging, retraction, inflammation or discharge. Hearing is grossly normal bilaterally. Nose:  External nasal examination shows no deformity or inflammation. Nasal mucosa are pink and moist without lesions or exudates. Mouth:  Oral mucosa and oropharynx without lesions or exudates.  Teeth in good repair. Neck:  No  deformities, masses, or tenderness noted. Lungs:  Normal respiratory effort, chest expands symmetrically. Lungs are clear to auscultation, no crackles or wheezes. Heart:  Normal rate and regular rhythm. S1 and S2 normal without gallop, murmur, click, rub or other extra sounds.   Impression & Recommendations:  Problem # 1:  PERIMENOPAUSAL STATUS (ICD-V49.81) Assessment Deteriorated Now actually floridly menopausal...on HRT. Cont w/ trmt..will probalby affect headaches one way or the other.  Problem # 2:  HYPOTHYROIDISM (ICD-244.9) Assessment: Unchanged Will check labs. Cont curr replacement. Her updated medication list for this problem includes:    Synthroid 88 Mcg Tabs (Levothyroxine sodium) .Marland Kitchen... Take 1 tablet by mouth once a day  Orders: Venipuncture ([address]) TLB-TSH (Thyroid Stimulating Hormone) (84443-TSH) TLB-Cholesterol, Direct LDL (83721-DIRLDL) TLB-T4 (Thyrox), Free [phone])  Labs Reviewed: TSH: 0.40 (05/07/2009)    Chol: 243 (05/07/2009)   HDL: 61.10 (05/07/2009)   LDL: 110 (12/11/2007)   TG: 116.0 (05/07/2009)  Problem # 3:  HEADACHE (ICD-784.0) Assessment: Unchanged  Going to headache clinic not helpful. She is still using lots of Fioricet. Not easily disabused of this practice. Will need script next month. Was only using 60 a month here until going to [facility]. Will try to get her down to that level at least. Will seek [facility] notes to assess what has been used and see if can try other alternatives. Her updated medication list for this problem includes:    Fioricet 50-325-40 Mg Tabs (Butalbital-apap-caffeine) .Marland Kitchen... 1 tab by mouth every 4 hrs as needed  for headache    Tylenol Extra Strength 500 Mg Tabs (Acetaminophen) .Marland Kitchen... As needed  Headache diary reviewed.  Problem # 4:  ANXIETY (ICD-300.00) Assessment: Improved Continues on Prozac as the only tolerable medication at this point. Will try to get notes from [facility] to ascertain everything she was on. Need to try  alternatives to this class also. Her updated medication list for this problem includes:    Diazepam 10 Mg Tabs (Diazepam) .Marland Kitchen... Take 1 tablet by mouth twice a day    Prozac 40 Mg Caps (Fluoxetine hcl) ..... One tab by mouth once daily  Complete Medication List: 1)  Diazepam 10 Mg Tabs (Diazepam) .... Take 1 tablet by mouth twice a day 2)  Fioricet 50-325-40 Mg Tabs (Butalbital-apap-caffeine) .Marland Kitchen.. 1 tab by mouth every 4 hrs as needed  for headache 3)  Synthroid 88 Mcg Tabs (Levothyroxine sodium) .... Take 1 tablet by mouth once a day 4)  Prozac 40 Mg Caps (Fluoxetine hcl) .... One tab by mouth once daily 5)  Tylenol Extra Strength 500 Mg Tabs (Acetaminophen) .... As needed  Patient Instructions: 1)  Pls get notes from [facility]. (Was seen twice and had many phone calls) 2)  spent with pt. Prescriptions: DIAZEPAM 10 MG TABS (DIAZEPAM) Take 1 tablet by mouth twice a day  #60 x 5   Entered and Authorized by:   Shaune Leeks MD   Signed by:   Shaune Leeks MD on 06/16/2010  Method used:   Print then Give to Patient   RxID:   [id number]    Orders Added: 1)  Venipuncture [36415] 2)  TLB-TSH (Thyroid Stimulating Hormone) [84443-TSH] 3)  TLB-Cholesterol, Direct LDL [83721-DIRLDL] 4)  TLB-T4 (Thyrox), Free [25956-LO7F] 5)  Est. Patient Level IV [64332]    Current Allergies (reviewed today): ! * CODIENE ! BACTRIM DS (SULFAMETHOXAZOLE-TRIMETHOPRIM)

## 2010-09-14 NOTE — Progress Notes (Signed)
Summary: rx fiorinal  Phone Note Refill Request Message from:  [facility] on November 30, 2007 1:10 PM  Refills Requested: Medication #1:  FIORINAL 50-325-40 MG CAPS Take 1 capsule by mouth every four hours   Last Refilled: 10/31/2007 [facility] [address] [phone] gets 50 at a time  Initial call taken by: Providence Crosby,  November 30, 2007 1:11 PM  Follow-up for Phone Call        Rx called to pharmacy as instructed. Follow-up by: Sydell Axon,  November 30, 2007 4:25 PM      Prescriptions: Benny Lennert 50-325-40 MG CAPS (BUTALBITAL-ASPIRIN-CAFFEINE) Take 1 capsule by mouth every four hours  #50 x 0   Entered and Authorized by:   Kerby Nora MD   Signed by:   Kerby Nora MD on 11/30/2007   Method used:   Handwritten   RxID:   [id number]

## 2010-09-14 NOTE — Progress Notes (Signed)
Summary: refill fiorinal for Dr. Lyndel Pleasure patient  Phone Note Refill Request Message from:  [facility] on October 17, 2008 7:46 AM  Refills Requested: Medication #1:  Benny Lennert 50-325-40 MG CAPS Take 1 capsule by mouth every four hours   Last Refilled: 09/18/2008 [facility] [address] [phone]  Initial call taken by: Providence Crosby,  October 17, 2008 7:46 AM  Follow-up for Phone Call        ok, 1 month supply Follow-up by: Hannah Beat MD,  October 17, 2008 9:26 AM      Prescriptions: Benny Lennert 50-325-40 MG CAPS (BUTALBITAL-ASPIRIN-CAFFEINE) Take 1 capsule by mouth every four hours  #50 x 0   Entered by:   Providence Crosby   Authorized by:   Hannah Beat MD   Signed by:   Providence Crosby on 10/17/2008   Method used:   Historical   RxID:   [id number]

## 2010-09-14 NOTE — Progress Notes (Signed)
  Phone Note Refill Request  on August 21, 2007 8:23 AM  Refills Requested: Medication #1:  Benny Lennert 50-325-40 MG CAPS Take 1 capsule by mouth every four hours   Last Refilled: 07/26/2007 fiorinal #30 with no refills   Method Requested: Fax to Local Pharmacy Initial call taken by: Providence Crosby,  August 21, 2007 8:24 AM

## 2010-09-14 NOTE — Progress Notes (Signed)
Summary: Rx Prozac  Phone Note Refill Request Call back at [phone] Message from:  [facility] on November 17, 2009 2:28 PM  Refills Requested: Medication #1:  PROZAC 40 MG CAPS one tab by mouth once daily   Last Refilled: 10/16/2009 Received faxed refill request, please advise   Method Requested: Telephone to Pharmacy Initial call taken by: Linde Gillis CMA Duncan Dull),  November 17, 2009 2:29 PM    Prescriptions: PROZAC 40 MG CAPS (FLUOXETINE HCL) one tab by mouth once daily  #30 x 12   Entered and Authorized by:   Shaune Leeks MD   Signed by:   Shaune Leeks MD on 11/17/2009   Method used:   Electronically to        [facility] [address] (retail)       [address]       [place], [place]  [address]       Ph: [phone]       Fax: [phone]   RxID:   [id number]

## 2010-09-14 NOTE — Progress Notes (Signed)
Summary: Rx Fiorinal  Phone Note Refill Request Call back at [phone] Message from:  [facility] [address] on July 16, 2008 10:40 AM  Refills Requested: Medication #1:  Benny Lennert 50-325-40 MG CAPS Take 1 capsule by mouth every four hours   Last Refilled: 06/13/2008 Received e-script.   Method Requested: Telephone to Pharmacy Initial call taken by: Sydell Axon,  July 16, 2008 10:41 AM  Follow-up for Phone Call        Rx called to pharmacy//TALKED WITH CHRIS AT [facility] Follow-up by: Providence Crosby,  July 16, 2008 2:17 PM      Prescriptions: Benny Lennert 11-914-78 MG CAPS (BUTALBITAL-ASPIRIN-CAFFEINE) Take 1 capsule by mouth every four hours  #50 x 0   Entered and Authorized by:   Shaune Leeks MD   Signed by:   Shaune Leeks MD on 07/16/2008   Method used:   Telephoned to ...       [facility] [address] (retail)       [address]       [place], [place]  [address]       Ph: [phone]       Fax: [phone]   RxID:   [id number]

## 2010-09-14 NOTE — Progress Notes (Signed)
Summary: refill fiorinal  Phone Note Refill Request Message from:  [facility] on March 09, 2009 2:36 PM  Refills Requested: Medication #1:  Benny Lennert 50-325-40 MG CAPS Take 1 capsule by mouth every four hours   Last Refilled: 01/28/2009 [facility] [address]    [phone]  Initial call taken by: Providence Crosby LPN,  March 09, 2009 2:37 PM  Follow-up for Phone Call        prescription called to [facility] pharmacy line Follow-up by: Providence Crosby LPN,  March 09, 2009 3:09 PM    Prescriptions: Benny Lennert 09-811-91 MG CAPS (BUTALBITAL-ASPIRIN-CAFFEINE) Take 1 capsule by mouth every four hours  #50 x 2   Entered and Authorized by:   Shaune Leeks MD   Signed by:   Shaune Leeks MD on 03/09/2009   Method used:   Telephoned to ...       [facility] [address] (retail)       [address]       [place], [place]  [address]       Ph: [phone]       Fax: [phone]   RxID:   [id number]

## 2010-09-14 NOTE — Progress Notes (Signed)
Summary: refill request for fioricet  Phone Note Refill Request Message from:  Fax from Pharmacy  Refills Requested: Medication #1:  FIORICET 50-325-40 MG TABS 1 tab by mouth every 4 hrs as needed  for headache   Last Refilled: 05/19/2009 Faxed request from [facility],phone [phone].  Initial call taken by: Lowella Petties CMA,  June 22, 2009 4:27 PM    Prescriptions: FIORICET 50-325-40 MG TABS [facility]) 1 tab by mouth every 4 hrs as needed  for headache  #60 x 0   Entered and Authorized by:   Shaune Leeks MD   Signed by:   Shaune Leeks MD on 06/22/2009   Method used:   Telephoned to ...       [facility] [address]* (retail)       [address]       [place], [place]  [address]       Ph: [phone]       Fax: [phone]   RxID:   [id number]  Telephoned in. Shaune Leeks MD  June 22, 2009 5:07 PM

## 2010-09-14 NOTE — Progress Notes (Signed)
Summary: fyi  Phone Note From Other Clinic Call back at [phone]   Caller: wanda - [address] Call For: schaller Summary of Call: pt was in hosp on 03/25/07 for chest pain, Dr Debby Bud saw her and ordered a nuclearr stress test it was scheduled for 03/30/07, pt called and canceled appt today. she says she is feeling better she is going out of town and when she returns she might reschedule. Initial call taken by: Liane Comber,  March 29, 2007 4:23 PM  Follow-up for Phone Call        noted Follow-up by: Gildardo Griffes FNP,  March 29, 2007 4:56 PM

## 2010-09-14 NOTE — Progress Notes (Signed)
Summary: Butalbital Compound  Phone Note Refill Request Message from:  Fax from Pharmacy on May 19, 2009 4:02 PM  Refills Requested: Medication #1:  FIORICET 50-325-40 MG TABS 1 tab by mouth every 4 hrs as needed  for headache Channing Mutters  Phone:   [phone]   Method Requested: Telephone to Pharmacy Initial call taken by: Delilah Shan CMA (AAMA),  May 19, 2009 4:03 PM  Follow-up for Phone Call        Rx called to pharmacy Follow-up by: Sydell Axon LPN,  May 19, 2009 5:32 PM    Prescriptions: FIORICET 50-325-40 MG TABS [facility]) 1 tab by mouth every 4 hrs as needed  for headache  #60 x 0   Entered and Authorized by:   Shaune Leeks MD   Signed by:   Shaune Leeks MD on 05/19/2009   Method used:   Telephoned to ...       [facility] [address]* (retail)       [address]       [place], [place]  [address]       Ph: [phone]       Fax: [phone]   RxID:   [id number]

## 2010-09-14 NOTE — Progress Notes (Signed)
Summary: Rx Fiorinal  Phone Note Call from Patient   Caller: Patient Call For: Shaune Leeks MD Summary of Call: Patient called to advise that she has requested that her pharmacy request a refill on her Fiorinal today because she has a bad migraine and requested that you refill this before the end of the day for her. Initial call taken by: Sydell Axon,  November 17, 2008 4:11 PM  Follow-up for Phone Call        prescribtion called to [facility]ist at [facility]/ Follow-up by: Providence Crosby,  November 17, 2008 4:46 PM      Prescriptions: Benny Lennert 50-325-40 MG CAPS (BUTALBITAL-ASPIRIN-CAFFEINE) Take 1 capsule by mouth every four hours  #50 x 0   Entered and Authorized by:   Shaune Leeks MD   Signed by:   Shaune Leeks MD on 11/17/2008   Method used:   Telephoned to ...       [facility] [address]* (retail)       [address]       [place], [place]  [address]       Ph: [phone]       Fax: [phone]   RxID:   [id number]

## 2010-09-14 NOTE — Progress Notes (Signed)
Summary:   PROZAC  Phone Note Refill Request Message from:  [facility] [phone] on October 24, 2008 11:37 AM  Refills Requested: Medication #1:  PROZAC 20 MG  CAPS one tab by mouth qd   Last Refilled: 09/16/2008 [facility] request   Method Requested: Telephone to Pharmacy Initial call taken by: Mervin Hack CMA,  October 24, 2008 11:37 AM      Prescriptions: PROZAC 20 MG  CAPS (FLUOXETINE HCL) one tab by mouth qd  #30 x 12   Entered and Authorized by:   Hannah Beat MD   Signed by:   Hannah Beat MD on 10/24/2008   Method used:   Electronically to        [facility] [address] (retail)       [address]       [place], [place]  [address]       Ph: [phone]       Fax: [phone]   RxID:   [id number]

## 2010-09-14 NOTE — Letter (Signed)
Summary: Dr.Heather Adkins,Neurology,Note  Dr.Heather Adkins,Neurology,Note   Imported By: Beau Fanny 09/08/2009 13:58:32  _____________________________________________________________________  External Attachment:    Type:   Image     Comment:   External Document

## 2010-09-14 NOTE — Progress Notes (Signed)
Summary: refill fiorinal for dr. Lyndel Pleasure patient  Phone Note Refill Request Message from:  [facility] on June 13, 2008 11:12 AM  Refills Requested: Medication #1:  Benny Lennert 50-325-40 MG CAPS Take 1 capsule by mouth every four hours   Last Refilled: 05/09/2008 #50  at [address] [phone]  Initial call taken by: Providence Crosby,  June 13, 2008 11:13 AM  Follow-up for Phone Call        okay #30 x 0 let her know that she is taking too many If taking more than 1 daily on average, you can actually create a chronic headache state please try to limit to no more than 1-2 days per week Follow-up by: Cindee Salt MD,  June 13, 2008 12:22 PM  Additional Follow-up for Phone Call Additional follow up Details #1::        prescribtion called to chris at [facility] pharmacy along with instructions Additional Follow-up by: Providence Crosby,  June 13, 2008 12:33 PM

## 2010-09-14 NOTE — Assessment & Plan Note (Signed)
Summary: ACU FOR UTI/PER LAURIE   Vital Signs:  Patient profile:   50 year old female Weight:      127 pounds BMI:     22.22 Temp:     98.6 degrees F oral Pulse rate:   72 / minute Pulse rhythm:   regular BP sitting:   122 / 82  (left arm) Cuff size:   regular  Vitals Entered By: Sydell Axon LPN (November 26, 2009 4:27 PM) CC: ? UTI, urine urgency and frequency   History of Present Illness: Pt seen as acute walkin. Is here for frequency and inability to empty. She has had this for many years and then they went away for a while for unknown reasons. Now they have recurred again slightly more frequently. She took Cystex last night.  She has been treated in the remote past by Urology with prophylaxis for three months and possibly that is when the infections stopped for a while. She was supposed to go to Urology last Fall but never made it due to multiple doctor visits and trmts.  Problems Prior to Update: 1)  Syncope  (ICD-780.2) 2)  Uti  (ICD-599.0) 3)  Perimenopausal Status  (ICD-V49.81) 4)  Health Maintenance Exam  (ICD-V70.0) 5)  Hypothyroidism  (ICD-244.9) 6)  Hyperlipidemia  (ICD-272.4) 7)  Headache  (ICD-784.0) 8)  Depression  (ICD-311) 9)  COPD  (ICD-496) 10)  Anxiety  (ICD-300.00)  Medications Prior to Update: 1)  Diazepam 10 Mg Tabs (Diazepam) .... Take 1 Tablet By Mouth Twice A Day 2)  Fioricet 50-325-40 Mg Tabs (Butalbital-Apap-Caffeine) .Marland Kitchen.. 1 Tab By Mouth Every 4 Hrs As Needed  For Headache 3)  Synthroid 88 Mcg  Tabs (Levothyroxine Sodium) .... Take 1 Tablet By Mouth Once A Day 4)  Beconase Aq 42 Mcg/spray  Susp (Beclomethasone Diprop Monohyd) .... Use One Spray Daily As Needed 5)  Prozac 40 Mg Caps (Fluoxetine Hcl) .... One Tab By Mouth Once Daily 6)  Ciprofloxacin Hcl 500 Mg Tabs (Ciprofloxacin Hcl) .... One Tab By Mouth Two Times A Day For Ten Days For Recurrent Uti 7)  Tylenol Extra Strength 500 Mg Tabs (Acetaminophen) .... As Needed  Allergies: 1)  ! *  Codiene 2)  ! Bactrim Ds (Sulfamethoxazole-Trimethoprim)  Physical Exam  General:  Well-developed,well-nourished,in no acute distress; alert,appropriate and cooperative throughout examination Head:  Normocephalic and atraumatic without obvious abnormalities. No apparent alopecia or balding. Eyes:  Conjunctiva clear bilaterally.  Abdomen:  No suprapubic tenderness to palpation. Msk:  No CVAT.   Impression & Recommendations:  Problem # 1:  UTI (ICD-599.0) Assessment New Presumed UTI altho minimal WBCs, lots of Bact. Will treat and culture. The following medications were removed from the medication list:    Ciprofloxacin Hcl 500 Mg Tabs (Ciprofloxacin hcl) ..... One tab by mouth two times a day for ten days for recurrent uti Her updated medication list for this problem includes:    Macrobid 100 Mg Caps (Nitrofurantoin monohyd macro) ..... One tab by mouth two times a day  Orders: Specimen Handling ([address]) T-Culture, Urine ([address]-09811) UA Dipstick W/ Micro (manual) ([address])  Complete Medication List: 1)  Diazepam 10 Mg Tabs (Diazepam) .... Take 1 tablet by mouth twice a day 2)  Fioricet 50-325-40 Mg Tabs (Butalbital-apap-caffeine) .Marland Kitchen.. 1 tab by mouth every 4 hrs as needed  for headache 3)  Synthroid 88 Mcg Tabs (Levothyroxine sodium) .... Take 1 tablet by mouth once a day 4)  Beconase Aq 42 Mcg/spray Susp (Beclomethasone diprop monohyd) .... Use one  spray daily as needed 5)  Prozac 40 Mg Caps (Fluoxetine hcl) .... One tab by mouth once daily 6)  Tylenol Extra Strength 500 Mg Tabs (Acetaminophen) .... As needed 7)  Macrobid 100 Mg Caps (Nitrofurantoin monohyd macro) .... One tab by mouth two times a day  Patient Instructions: 1)  Will culture urine.  2)  If cult pos, will treat pt for three weeks.  3)  Will give Macrobid for one week now.  4)  If cult neg. will get U/S  torule out pelvic pathology and have her see Urology. Prescriptions: MACROBID 100 MG CAPS (NITROFURANTOIN  MONOHYD MACRO) one tab by mouth two times a day  #14 x 0   Entered and Authorized by:   Shaune Leeks MD   Signed by:   Shaune Leeks MD on 11/26/2009   Method used:   Electronically to        [facility] [address]* (retail)       [address]       [place], [place]  [address]       Ph: [phone]       Fax: [phone]   RxID:   [id number]   Current Allergies (reviewed today): ! * CODIENE ! BACTRIM DS (SULFAMETHOXAZOLE-TRIMETHOPRIM)  Laboratory Results   Urine Tests  Date/Time Received: November 26, 2009 4:28 PM  Date/Time Reported: November 26, 2009 4:28 PM   Routine Urinalysis   Color: yellow Appearance: Hazy Glucose: negative   (Normal Range: Negative) Bilirubin: negative   (Normal Range: Negative) Ketone: negative   (Normal Range: Negative) Spec. Gravity: 1.015   (Normal Range: 1.003-1.035) Blood: negative   (Normal Range: Negative) pH: 6.0   (Normal Range: 5.0-8.0) Protein: negative   (Normal Range: Negative) Urobilinogen: 0.2   (Normal Range: 0-1) Nitrite: negative   (Normal Range: Negative) Leukocyte Esterace: trace   (Normal Range: Negative)  Urine Microscopic WBC/HPF: 0-2 RBC/HPF: 0-1 Bacteria/HPF: 4+ Epithelial/HPF: Rare

## 2010-09-14 NOTE — Progress Notes (Signed)
Summary: REFILL DIAZEPAM  Phone Note Refill Request Message from:  Fax from Pharmacy on September 10, 2008 1:07 PM  Refills Requested: Medication #1:  DIAZEPAM 10 MG TABS Take 1 tablet by mouth twice a day   Last Refilled: 07/29/2008 [facility] [phone]  Initial call taken by: Providence Crosby,  September 10, 2008 1:08 PM  Follow-up for Phone Call        prescribtion called to chris at [facility]// pharmacist Follow-up by: Providence Crosby,  September 10, 2008 5:26 PM      Prescriptions: DIAZEPAM 10 MG TABS (DIAZEPAM) Take 1 tablet by mouth twice a day  #60 x 1   Entered and Authorized by:   Shaune Leeks MD   Signed by:   Shaune Leeks MD on 09/10/2008   Method used:   Telephoned to ...       [facility] [address]* (retail)       [address]       [place], [place]  [address]       Ph: [phone]       Fax: [phone]   RxID:   [id number]  Shaune Leeks MD  September 10, 2008 5:17 PM

## 2010-09-14 NOTE — Progress Notes (Signed)
Summary: RX FIORINAL FOR DR.SCHALLERS PATIENT.  Phone Note Refill Request Message from:  Fax from Pharmacy on April 10, 2008 4:13 PM  Refills Requested: Medication #1:  Jessica Barnes 50-325-40 MG CAPS Take 1 capsule by mouth every four hours   Last Refilled: 03/17/2008 [facility]   [phone] FAX// PHONE [phone]  Initial call taken by: Providence Crosby,  April 10, 2008 4:14 PM  Follow-up for Phone Call        okay #50 x 0 Follow-up by: Cindee Salt MD,  April 10, 2008 5:17 PM  Additional Follow-up for Phone Call Additional follow up Details #1::        PRESCRIPTION FAXED TO [facility] Additional Follow-up by: Providence Crosby,  April 10, 2008 5:29 PM      Prescriptions: Jessica Barnes 50-325-40 MG CAPS (BUTALBITAL-ASPIRIN-CAFFEINE) Take 1 capsule by mouth every four hours  #50 x 0   Entered by:   Providence Crosby   Authorized by:   Cindee Salt MD   Signed by:   Providence Crosby on 04/10/2008   Method used:   Print then Give to Patient   RxID:   [id number]

## 2010-09-14 NOTE — Assessment & Plan Note (Signed)
Summary: Leaving urine for UA, culture per Dr. Hetty Ely  Nurse Visit   History of Present Illness: Pt left urine for U/A  due to dysuria and blood.    Impression & Recommendations:  Problem # 1:  UTI (ICD-599.0) Assessment New  Will send for U/C.   Her updated medication list for this problem includes:    Ciprofloxacin Hcl 500 Mg Tabs (Ciprofloxacin hcl) ..... One tab by mouth two times a day  Orders: UA Dipstick W/ Micro (manual) (25366) Specimen Handling (99000) T-Culture, Urine ([id number])  Complete Medication List: 1)  Diazepam 10 Mg Tabs (Diazepam) .... Take 1 tablet by mouth twice a day 2)  Fiorinal 50-325-40 Mg Caps (Butalbital-aspirin-caffeine) .... Take 1 capsule by mouth every four hours 3)  Synthroid 88 Mcg Tabs (Levothyroxine sodium) .... Take 1 tablet by mouth once a day 4)  Beconase Aq 42 Mcg/spray Susp (Beclomethasone diprop monohyd) .... Prn 5)  Prozac 40 Mg Caps (Fluoxetine hcl) .... One tab by mouth once daily 6)  Ciprofloxacin Hcl 500 Mg Tabs (Ciprofloxacin hcl) .... One tab by mouth two times a day    Allergies: 1)  ! * Codiene Laboratory Results   Urine Tests  Date/Time Received: May 05, 2009 12:02 PM  Date/Time Reported: May 05, 2009 12:02 PM   Routine Urinalysis   Color: orange Appearance: Cloudy Glucose: negative   (Normal Range: Negative) Bilirubin: negative   (Normal Range: Negative) Ketone: negative   (Normal Range: Negative) Spec. Gravity: 1.020   (Normal Range: 1.003-1.035) Blood: large   (Normal Range: Negative) pH: 6.0   (Normal Range: 5.0-8.0) Protein: 100   (Normal Range: Negative) Urobilinogen: 0.2   (Normal Range: 0-1) Nitrite: negative   (Normal Range: Negative) Leukocyte Esterace: moderate   (Normal Range: Negative)  Urine Microscopic WBC/HPF: TNTC RBC/HPF: 0-1 Bacteria/HPF: 2+ Epithelial/HPF: Rare    Comments: Patient's pharmacy is [facility].    Orders Added: 1)  UA Dipstick W/ Micro  (manual) [81000] 2)  Specimen Handling [99000] 3)  T-Culture, Urine [[id number]] Prescriptions: CIPROFLOXACIN HCL 500 MG TABS (CIPROFLOXACIN HCL) one tab by mouth two times a day  #20 x 0   Entered and Authorized by:   Shaune Leeks MD   Signed by:   Shaune Leeks MD on 05/05/2009   Method used:   Electronically to        [facility] [address] (retail)       [address]       [place], [place]  [address]       Ph: [phone]       Fax: [phone]   RxID:   [id number]  Patient notified that rx has been sent to pharmacy.

## 2010-09-14 NOTE — Progress Notes (Signed)
Summary: Fiorinal  Phone Note Refill Request Message from:  [facility] on August 19, 2008 2:17 PM  Refills Requested: Medication #1:  Charissa Bash MG CAPS Take 1 capsule by mouth every four hours   Last Refilled: 07/16/2008 e-scribe request   Method Requested: Electronic Initial call taken by: Mervin Hack CMA,  August 19, 2008 2:17 PM  Follow-up for Phone Call        Dr Lorenza Chick pt   Additional Follow-up for Phone Call Additional follow up Details #1::        PRESCRIBTION CALLED TO [facility] PHARMACIST  CHRIS Additional Follow-up by: Providence Crosby,  August 19, 2008 4:50 PM      Prescriptions: Benny Lennert 50-325-40 MG CAPS (BUTALBITAL-ASPIRIN-CAFFEINE) Take 1 capsule by mouth every four hours  #50 x 0   Entered and Authorized by:   Shaune Leeks MD   Signed by:   Shaune Leeks MD on 08/19/2008   Method used:   Telephoned to ...       [facility] [address] (retail)       [address]       [place], [place]  [address]       Ph: [phone]       Fax: [phone]   RxID:   [id number]

## 2010-09-14 NOTE — Progress Notes (Signed)
Summary: rx fiorinal  Phone Note Refill Request Message from:  [facility] on January 29, 2008 11:18 AM  Refills Requested: Medication #1:  FIORINAL 50-325-40 MG CAPS Take 1 capsule by mouth every four hours   Last Refilled: 01/01/2008 [facility] [facility] [address]   [phone]  Initial call taken by: Providence Crosby,  January 29, 2008 11:18 AM  Follow-up for Phone Call        prescription called to pharmacy Follow-up by: Providence Crosby,  January 29, 2008 2:13 PM      Prescriptions: Jessica Barnes 45-409-81 MG CAPS (BUTALBITAL-ASPIRIN-CAFFEINE) Take 1 capsule by mouth every four hours  #50 x 2   Entered and Authorized by:   Shaune Leeks MD   Signed by:   Shaune Leeks MD on 01/29/2008   Method used:   Print then Give to Patient   RxID:   [id number]  Shaune Leeks MD  January 29, 2008 1:51 PM

## 2010-09-15 ENCOUNTER — Telehealth: Payer: Self-pay | Admitting: Family Medicine

## 2010-09-22 NOTE — Progress Notes (Signed)
Summary: fioricet  Phone Note Refill Request Message from:  Fax from Pharmacy on September 15, 2010 10:02 AM  Refills Requested: Medication #1:  FIORICET 50-325-40 MG TABS 1 tab by mouth every 4 hrs as needed  for headache   Last Refilled: 06/14/2010 [facility] [address] [phone].   Initial call taken by: Melody Comas,  September 15, 2010 10:03 AM  Follow-up for Phone Call        Rx phoned to pharmacy. Follow-up by: Melody Comas,  September 15, 2010 1:08 PM    Prescriptions: FIORICET 50-325-40 MG TABS [facility]) 1 tab by mouth every 4 hrs as needed  for headache  #60 x 0   Entered and Authorized by:   Shaune Leeks MD   Signed by:   Shaune Leeks MD on 09/15/2010   Method used:   Telephoned to ...       [facility] [address] (retail)       [address]       [place], [place]  [address]       Ph: [phone]       Fax: [phone]   RxID:   [id number]

## 2010-10-15 ENCOUNTER — Telehealth (INDEPENDENT_AMBULATORY_CARE_PROVIDER_SITE_OTHER): Payer: Self-pay | Admitting: *Deleted

## 2010-10-18 ENCOUNTER — Telehealth: Payer: Self-pay | Admitting: Family Medicine

## 2010-10-21 NOTE — Progress Notes (Signed)
Summary: fioricet  Phone Note Refill Request Message from:  Fax from Pharmacy on October 15, 2010 9:52 AM  Refills Requested: Medication #1:  FIORICET 50-325-40 MG TABS 1 tab by mouth every 4 hrs as needed  for headache   Last Refilled: 09/15/2010 [facility] [address] [phone].  Initial call taken by: Melody Comas,  October 15, 2010 9:58 AM  Follow-up for Phone Call        Rx called to pharmacy. Follow-up by: Melody Comas,  October 15, 2010 4:51 PM    Prescriptions: FIORICET 50-325-40 MG TABS [facility]) 1 tab by mouth every 4 hrs as needed  for headache  #60 x 0   Entered and Authorized by:   Shaune Leeks MD   Signed by:   Shaune Leeks MD on 10/15/2010   Method used:   Telephoned to ...       [facility] [address] (retail)       [address]       [place], [place]  [address]       Ph: [phone]       Fax: [phone]   RxID:   [id number]

## 2010-10-26 NOTE — Progress Notes (Signed)
Summary: fioricet   Phone Note Refill Request Message from:  Fax from Pharmacy on October 18, 2010 11:13 AM  Refills Requested: Medication #1:  FIORICET 50-325-40 MG TABS 1 tab by mouth every 4 hrs as needed  for headache   Last Refilled: 09/15/2010 [facility] [phone].   Initial call taken by: Melody Comas,  October 18, 2010 11:15 AM  Follow-up for Phone Call        Rx phoned to pharmacy. Follow-up by: Melody Comas,  October 18, 2010 2:13 PM    Prescriptions: FIORICET 50-325-40 MG TABS [facility]) 1 tab by mouth every 4 hrs as needed  for headache  #60 x 0   Entered and Authorized by:   Shaune Leeks MD   Signed by:   Shaune Leeks MD on 10/18/2010   Method used:   Telephoned to ...       [facility] [address] (retail)       [address]       [place], [place]  [address]       Ph: [phone]       Fax: [phone]   RxID:   [id number]

## 2010-11-29 ENCOUNTER — Other Ambulatory Visit: Payer: Self-pay | Admitting: *Deleted

## 2010-11-29 MED ORDER — FLUOXETINE HCL 40 MG PO CAPS: 40.0000 mg | ORAL_CAPSULE | Freq: Every day | ORAL | Status: DC

## 2010-12-17 ENCOUNTER — Other Ambulatory Visit: Payer: Self-pay | Admitting: *Deleted

## 2010-12-18 MED ORDER — BUTALBITAL-APAP-CAFF-COD 50-325-40-30 MG PO CAPS: 1.0000 | ORAL_CAPSULE | ORAL | Status: DC | PRN

## 2010-12-21 MED ORDER — BUTALBITAL-APAP-CAFF-COD 50-325-40-30 MG PO CAPS: 1.0000 | ORAL_CAPSULE | ORAL | Status: AC | PRN

## 2010-12-21 NOTE — Telephone Encounter (Signed)
Thought I did this over the weekend. This may be refilled, 2 RF.

## 2010-12-21 NOTE — Telephone Encounter (Signed)
Can this be refilled? 

## 2010-12-21 NOTE — Telephone Encounter (Signed)
Rx called to pharmacy

## 2010-12-28 NOTE — Discharge Summary (Signed)
Jessica Barnes, Jessica Barnes             ACCOUNT NO.:  [id number]   MEDICAL RECORD NO.:  [id number]          PATIENT TYPE:  INP   LOCATION:  3702                         FACILITY:  [facility]   PHYSICIAN:  Rosalyn Gess. Norins, MD  DATE OF BIRTH:  03-Sep-1960   DATE OF ADMISSION:  03/25/2007  DATE OF DISCHARGE:  03/26/2007                               DISCHARGE SUMMARY   DISCHARGE DIAGNOSES:  1. Atypical chest pain.  Cardiac enzymes negative.  2. Anxiety/depression.  3. History of migraines.  4. Hypothyroid.   HISTORY OF PRESENT ILLNESS:  Jessica Barnes is a 50 year old female who  was admitted on March 26, 2007, with chief complaint of chest pain.  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  This lasted for approximately 35 minutes  while she was on the stage.  She then exited the stage and went to the  bedroom and reported feeling somewhat better.  She was transferred to  [facility] via EMS and noted some improvement with sublingual  nitroglycerin; however, this did cause headache.  She was admitted for  further evaluation and treatment.   PAST MEDICAL HISTORY:  1. Hypothyroidism.  2. Depression.  3. Migraines.   COURSE OF HOSPITALIZATION:  Atypical chest pain:  The patient was  admitted and underwent serial cardiac enzymes, which were negative.  She  maintained normal sinus rhythm on telemetry.  She had a chest x-ray  performed on admission, which showed no acute disease.  She has no clear  cardiac risk factors.  At this time, we plan to check a d-dimer.  If  this is normal, plan to discharge her home without patient's stress  test.  If d-dimer is positive, we will check a CT angio chest prior to  discharge.  Suspect the patient's symptoms are most likely related to  recent stress.  Continue Prozac and p.r.n. Valium as prior to admission.   MEDICATIONS AT  THE TIME OF DISCHARGE:  1. Synthroid 88 mcg p.o. daily.  2. Prozac 40 mg p.o. daily.  3. Fiorinal 1 tab p.o. every 4 hours p.r.n.  4. Prilosec OTC once daily.  5. Valium 5-10 mg p.o. every 8 hours as needed.   DISPOSITION:  Plan to discharge patient to home.   PERTINENT LABORATORY:  Hemoglobin 12.9, hematocrit 38, BUN 6, creatinine  0.67.   FOLLOWUP:  The patient is instructed to follow up with Dr. Laurita Quint in 1 to 2 weeks and contact the office for an appointment.  We  have requested [facility] to make arrangements for an outpatient  stress test.      Sandford Craze, NP      Rosalyn Gess. Norins, MD  Electronically Signed    MO/MEDQ  D:  03/26/2007  T:  03/26/2007  Job:  [id number]   cc:   Arta Silence, MD
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
## 2010-12-29 ENCOUNTER — Other Ambulatory Visit: Payer: Self-pay | Admitting: *Deleted

## 2010-12-29 MED ORDER — DIAZEPAM 10 MG PO TABS: ORAL_TABLET | ORAL | Status: DC

## 2010-12-30 NOTE — Telephone Encounter (Signed)
Rx called to pharmacy per Dr Hetty Ely.

## 2011-01-20 ENCOUNTER — Encounter: Payer: Self-pay | Admitting: Family Medicine

## 2011-01-24 ENCOUNTER — Other Ambulatory Visit: Payer: Self-pay | Admitting: *Deleted

## 2011-01-24 ENCOUNTER — Encounter: Payer: Self-pay | Admitting: Family Medicine

## 2011-01-24 ENCOUNTER — Telehealth: Payer: Self-pay | Admitting: *Deleted

## 2011-01-24 ENCOUNTER — Ambulatory Visit (INDEPENDENT_AMBULATORY_CARE_PROVIDER_SITE_OTHER): Payer: BC Managed Care – PPO | Admitting: Family Medicine

## 2011-01-24 DIAGNOSIS — Z780 Asymptomatic menopausal state: Secondary | ICD-10-CM

## 2011-01-24 DIAGNOSIS — R51 Headache: Secondary | ICD-10-CM

## 2011-01-24 DIAGNOSIS — F411 Generalized anxiety disorder: Secondary | ICD-10-CM

## 2011-01-24 DIAGNOSIS — F329 Major depressive disorder, single episode, unspecified: Secondary | ICD-10-CM

## 2011-01-24 DIAGNOSIS — D485 Neoplasm of uncertain behavior of skin: Secondary | ICD-10-CM

## 2011-01-24 MED ORDER — BUTALBITAL-APAP-CAFFEINE 50-325-40 MG PO TABS: ORAL_TABLET | ORAL | Status: DC

## 2011-01-24 MED ORDER — BUTALBITAL-APAP-CAFFEINE 50-325-40 MG PO TABS: 1.0000 | ORAL_TABLET | Freq: Four times a day (QID) | ORAL | Status: DC | PRN

## 2011-01-24 NOTE — Patient Instructions (Signed)
RTC if mole on scalp does not improve. May need to be excised.

## 2011-01-24 NOTE — Assessment & Plan Note (Signed)
Waxes and wanes. Has seen counseling for prolonged period in the past. Seems somewhat stable.

## 2011-01-24 NOTE — Telephone Encounter (Signed)
[facility] called to verify sig. Sig verified.

## 2011-01-24 NOTE — Progress Notes (Signed)
  Subjective:    Patient ID: Jessica Barnes, female    DOB: 1961/02/26, 50 y.o.   MRN: [id number]  HPI Pt here as acute visit for having a ST on the right side , head hurting in the occipital area. She continues to have headaches every day and continues with PTSD after his son's catastrophic accident and saw a therapist for 2 years after that. She continues to have problems. The son is still not back to normal. He is very different.  At times she is better but at other times she gets triggered with chest constriction. Everything in my life is "before the accident and after the accident." He has bad short term memory and some long term memory problems, he sweats a lot.  She is stressed a lot. She stopped all her hormone therapy because she couldn't be "controlled." She was having irregular bleeding and felt miserable on the Progesterone while taking the Estrogen, so she stopped it all. Her mother had bad problems with menopause. She was also given Testosterone which made her feel bad. All of these things were given to her via pellet therapy. She is willing to do OTC things or Holistic approaches.  She hasn't been doing things she used to like. Hasn't been to church, hasn't been to the [employer] .  She has just gotten a Chief of Staff. She also has IBS and significant anxiety. She does not think she could sit on a jury in her present state of mind.  She has also started back smoking. We discussed the risks in her particular situation of headaches and the anxiety.  She has two skin lesions she would like checked. She is having trouble sleeping with her hot flash cycling at night.  She needs more headache medication (Fioricet) although she thinks Fiorinal works better and she therefore prefers that. SHe has been seen at both headache clincs in [place] and the headache clinc./pain clinic at [facility] without success. Review of Systems  Constitutional: Negative for fever, chills, diaphoresis, activity change and  fatigue.  HENT: Negative for ear pain, congestion, rhinorrhea and postnasal drip.   Eyes: Negative for redness.  Respiratory: Negative for cough, chest tightness, shortness of breath and wheezing.   Cardiovascular: Negative for chest pain.  Neurological: Positive for weakness and headaches. Negative for dizziness, tremors, facial asymmetry, light-headedness and numbness.       Objective:   Physical Exam  Skin: Skin is warm and dry. No erythema.       Benign 5mm maculopapular mole on mid right abdomen, well circumscribed without umbilication. 8mm maculopaputlar lesion of the left occipitoparietal area of the scalp with umbilication and slightly indistinct borders.          Assessment & Plan:

## 2011-01-24 NOTE — Telephone Encounter (Signed)
Opened in error,

## 2011-01-24 NOTE — Assessment & Plan Note (Signed)
Continues with Prozac. Have nothing else to offer and she requires nothing else at this time.

## 2011-01-24 NOTE — Assessment & Plan Note (Signed)
Benign mole of the middle right abdomen. Will follow. Suspicious lesion of the scalp. Follow and leave alone for one full month and return if not impoved for excision.

## 2011-01-24 NOTE — Assessment & Plan Note (Signed)
Now in full blown menopause with hot flashes uncontrolled via menopause clinc. Declines hormones at all.  Suggested Melatonin for sleep, may continue Diphenhydramine if preferred.

## 2011-01-24 NOTE — Assessment & Plan Note (Signed)
Will  Give Fioricet 60/5. At the rate she actually takes this medication, she should not be taking Fioinal as it would be an unjustified risk with the Aspirin.

## 2011-05-30 LAB — CK TOTAL AND CKMB (NOT AT ARMC)
CK, MB: 1.2
Relative Index: INVALID
Total CK: 83

## 2011-05-30 LAB — COMPREHENSIVE METABOLIC PANEL
ALT: 19
ALT: 21
AST: 24
AST: 25
Albumin: 3.3 — ABNORMAL LOW
Albumin: 3.5
Alkaline Phosphatase: 86
Alkaline Phosphatase: 87
BUN: 6
BUN: 7
CO2: 28
CO2: 28
Calcium: 8.8
Calcium: 9
Chloride: 104
Chloride: 107
Creatinine, Ser: 0.55
Creatinine, Ser: 0.67
GFR calc Af Amer: >60
GFR calc Af Amer: >60
GFR calc non Af Amer: >60
GFR calc non Af Amer: >60
Glucose, Bld: 103 — ABNORMAL HIGH
Glucose, Bld: 96
Potassium: 3 — ABNORMAL LOW
Potassium: 3.9
Sodium: 139
Sodium: 140
Total Bilirubin: 0.3
Total Bilirubin: 0.4
Total Protein: 6.1
Total Protein: 6.3

## 2011-05-30 LAB — DIFFERENTIAL
Basophils Absolute: 0.1
Basophils Relative: 1
Eosinophils Absolute: 0.1
Eosinophils Relative: 1
Lymphocytes Relative: 32
Lymphs Abs: 3.2
Monocytes Absolute: 0.7
Monocytes Relative: 7
Neutro Abs: 6
Neutrophils Relative %: 60

## 2011-05-30 LAB — CARDIAC PANEL(CRET KIN+CKTOT+MB+TROPI)
CK, MB: 1
CK, MB: 1.1
Relative Index: INVALID
Relative Index: INVALID
Total CK: 72
Total CK: 75
Troponin I: 0.01
Troponin I: 0.01

## 2011-05-30 LAB — POCT CARDIAC MARKERS
CKMB, poc: <1 — ABNORMAL LOW
Myoglobin, poc: 55.7
Operator id: 192351
Troponin i, poc: <0.05

## 2011-05-30 LAB — I-STAT 8, (EC8 V) (CONVERTED LAB)
BUN: 8
Bicarbonate: 25.6 — ABNORMAL HIGH
Chloride: 103
Glucose, Bld: 101 — ABNORMAL HIGH
HCT: 40
Hemoglobin: 13.6
Operator id: 192351
Potassium: 3.3 — ABNORMAL LOW
Sodium: 139
TCO2: 27
pCO2, Ven: 42.3 — ABNORMAL LOW
pH, Ven: 7.389 — ABNORMAL HIGH

## 2011-05-30 LAB — POCT I-STAT CREATININE
Creatinine, Ser: 0.8
Operator id: 192351

## 2011-05-30 LAB — CBC
HCT: 38
HCT: 38.1
Hemoglobin: 12.8
Hemoglobin: 12.9
MCHC: 33.5
MCHC: 33.9
MCV: 92.3
MCV: 93
Platelets: 273
Platelets: 274
RBC: 4.09
RBC: 4.11
RDW: 12.9
RDW: 13
WBC: 10
WBC: 7.3

## 2011-05-30 LAB — D-DIMER, QUANTITATIVE: D-Dimer, Quant: 0.41

## 2011-05-30 LAB — TROPONIN I: Troponin I: 0.01

## 2011-07-03 ENCOUNTER — Other Ambulatory Visit: Payer: Self-pay | Admitting: Family Medicine

## 2011-07-05 NOTE — Telephone Encounter (Signed)
Diazepam refills called [facility] [place].

## 2011-07-28 ENCOUNTER — Other Ambulatory Visit: Payer: Self-pay | Admitting: Family Medicine

## 2011-07-28 NOTE — Telephone Encounter (Signed)
Received refill request electronically from pharmacy. Is it okay to refill medication? 

## 2011-09-04 ENCOUNTER — Other Ambulatory Visit: Payer: Self-pay | Admitting: Family Medicine

## 2012-01-11 ENCOUNTER — Encounter: Payer: Self-pay | Admitting: Family Medicine

## 2012-01-11 ENCOUNTER — Ambulatory Visit (INDEPENDENT_AMBULATORY_CARE_PROVIDER_SITE_OTHER): Payer: BC Managed Care – PPO | Admitting: Family Medicine

## 2012-01-11 VITALS — BP 126/82 | HR 84 | Temp 98.3°F | Wt 137.0 lb

## 2012-01-11 DIAGNOSIS — E039 Hypothyroidism, unspecified: Secondary | ICD-10-CM

## 2012-01-11 DIAGNOSIS — F3289 Other specified depressive episodes: Secondary | ICD-10-CM

## 2012-01-11 DIAGNOSIS — F329 Major depressive disorder, single episode, unspecified: Secondary | ICD-10-CM

## 2012-01-11 DIAGNOSIS — R51 Headache: Secondary | ICD-10-CM

## 2012-01-11 MED ORDER — BUTALBITAL-APAP-CAFFEINE 50-325-40 MG PO TABS: ORAL_TABLET | ORAL | Status: DC

## 2012-01-11 MED ORDER — FLUOXETINE HCL 40 MG PO CAPS: ORAL_CAPSULE | ORAL | Status: DC

## 2012-01-11 MED ORDER — LEVOTHYROXINE SODIUM 88 MCG PO TABS: 88.0000 ug | ORAL_TABLET | Freq: Every day | ORAL | Status: DC

## 2012-01-11 NOTE — Patient Instructions (Addendum)
Go to the lab on the way out.  We'll contact you with your lab report. Take care.   

## 2012-01-11 NOTE — Progress Notes (Signed)
Intermittent smoker, rare use.  We discussed.    H/o depression and anxiety.  S/p counseling after son's TBI.  She likely had PTSD with nightmares and flashbacks.  Mood is okay, "I'm better" in the last year.  Compliant with meds.  No Si/Hi.  Occ BZD use for insomnia, using a few month now, not daily.  Longstanding h/o HA s/o neuro eval and using fiorcet with some relief, until she ran out.  No prev help with imitrex or similar. She is perimenopausal and HAs are worse around her menses.  Photo and phonophbia.  Vision changes and nausea with HA.  fioricet did as much good as anything else.    Hypothyroid.  Compliant with meds, due for labs.  No neck mass or dysphagia.    PMH and SH reviewed  ROS: See HPI, otherwise noncontributory.  Meds, vitals, and allergies reviewed.    GEN: nad, alert and oriented HEENT: mucous membranes moist NECK: supple w/o LA CV: rrr.  PULM: ctab, no inc wob ABD: soft, +bs EXT: no edema SKIN: no acute rash

## 2012-01-12 ENCOUNTER — Encounter: Payer: Self-pay | Admitting: Family Medicine

## 2012-01-12 ENCOUNTER — Other Ambulatory Visit: Payer: Self-pay | Admitting: Family Medicine

## 2012-01-12 DIAGNOSIS — E039 Hypothyroidism, unspecified: Secondary | ICD-10-CM

## 2012-01-12 LAB — TSH: TSH: 0.25 u[IU]/mL — ABNORMAL LOW (ref 0.35–5.50)

## 2012-01-12 NOTE — Assessment & Plan Note (Signed)
Controlled, affect wnl today, continue current meds.  Has family support.  No SI/HI.  Okay for outpatient fu.  She agrees with plan.

## 2012-01-12 NOTE — Assessment & Plan Note (Signed)
S/p HA clinic eval.  Would continue current meds as this is the only thing that has made a difference so far.  She agrees.  No new symptoms to direct further w/u today. >25 min spent with face to face with patient, >50% counseling and/or coordinating care

## 2012-01-12 NOTE — Assessment & Plan Note (Signed)
Plan to continue current meds, with TSH pending.  Will adjust if needed. See notes on labs when resulted.

## 2012-02-03 ENCOUNTER — Other Ambulatory Visit: Payer: Self-pay | Admitting: *Deleted

## 2012-02-03 MED ORDER — DIAZEPAM 10 MG PO TABS: 10.0000 mg | ORAL_TABLET | Freq: Every evening | ORAL | Status: DC | PRN

## 2012-02-03 NOTE — Telephone Encounter (Signed)
Faxed request from [facility] [place], last filled 12/06/11.

## 2012-02-03 NOTE — Telephone Encounter (Signed)
plz phone in. 

## 2012-02-03 NOTE — Telephone Encounter (Signed)
Rx called in as directed.   

## 2012-02-14 ENCOUNTER — Other Ambulatory Visit: Payer: Self-pay | Admitting: Family Medicine

## 2012-02-14 NOTE — Telephone Encounter (Signed)
Received refill request electronically from pharmacy.  Last office visit 05//29/13. Is it okay to refill medication?

## 2012-02-14 NOTE — Telephone Encounter (Signed)
Electronic refill request

## 2012-02-14 NOTE — Telephone Encounter (Signed)
Sent 01/11/12.  Clarify with pharmacy.  Thanks.

## 2012-03-07 ENCOUNTER — Other Ambulatory Visit: Payer: Self-pay | Admitting: *Deleted

## 2012-03-07 MED ORDER — DIAZEPAM 10 MG PO TABS: 10.0000 mg | ORAL_TABLET | Freq: Every evening | ORAL | Status: DC | PRN

## 2012-03-07 NOTE — Telephone Encounter (Signed)
Rx called to pharmacy as instructed. 

## 2012-03-07 NOTE — Telephone Encounter (Signed)
Please call in

## 2012-03-07 NOTE — Telephone Encounter (Signed)
Received faxed refill request from pharmacy. Last office visit 01/11/12. Is it okay to refill medication?

## 2012-04-09 ENCOUNTER — Other Ambulatory Visit: Payer: Self-pay | Admitting: *Deleted

## 2012-04-09 MED ORDER — BUTALBITAL-APAP-CAFFEINE 50-325-40 MG PO TABS: ORAL_TABLET | ORAL | Status: DC

## 2012-04-09 NOTE — Telephone Encounter (Signed)
Please call in

## 2012-04-09 NOTE — Telephone Encounter (Signed)
[place]arm[place] and could not be transferred [place].  Rx has been filled twice.

## 2012-04-10 NOTE — Telephone Encounter (Signed)
Medication phoned to pharmacy.  

## 2012-04-23 ENCOUNTER — Other Ambulatory Visit: Payer: Self-pay | Admitting: *Deleted

## 2012-04-23 MED ORDER — DIAZEPAM 10 MG PO TABS: 10.0000 mg | ORAL_TABLET | Freq: Every evening | ORAL | Status: DC | PRN

## 2012-04-23 NOTE — Telephone Encounter (Signed)
Received faxed refill request from pharmacy. Last office visit 01/11/12. Is it okay to refill medication? 

## 2012-04-23 NOTE — Telephone Encounter (Signed)
Medication phoned to pharmacy.  

## 2012-04-23 NOTE — Telephone Encounter (Signed)
Please call in

## 2012-06-27 ENCOUNTER — Other Ambulatory Visit: Payer: Self-pay | Admitting: Family Medicine

## 2012-06-27 NOTE — Telephone Encounter (Signed)
Please call in

## 2012-06-27 NOTE — Telephone Encounter (Signed)
Patient last seen 12/2011.

## 2012-06-28 NOTE — Telephone Encounter (Signed)
Medication phoned to pharmacy.  

## 2012-07-17 ENCOUNTER — Other Ambulatory Visit: Payer: Self-pay | Admitting: *Deleted

## 2012-07-18 MED ORDER — DIAZEPAM 10 MG PO TABS: 10.0000 mg | ORAL_TABLET | Freq: Every evening | ORAL | Status: DC | PRN

## 2012-07-18 NOTE — Telephone Encounter (Signed)
Rx called to pharmacy as instructed. 

## 2012-07-18 NOTE — Telephone Encounter (Signed)
Please call in

## 2012-07-26 ENCOUNTER — Other Ambulatory Visit: Payer: Self-pay | Admitting: Family Medicine

## 2012-07-26 NOTE — Telephone Encounter (Signed)
Electronic refill request

## 2012-07-26 NOTE — Telephone Encounter (Signed)
Verify with patient.  She had rx with refill sent 11/13.  If needing this frequently (ie the prev rx and refill are used up), then fill x1 with no additional refills and schedule f/u OV to discuss.  Thanks.

## 2012-07-26 NOTE — Telephone Encounter (Signed)
LMOVM at home to return call.

## 2012-07-27 ENCOUNTER — Other Ambulatory Visit: Payer: Self-pay | Admitting: *Deleted

## 2012-07-27 NOTE — Telephone Encounter (Signed)
Pt left v/m requesting call back [phone].

## 2012-07-27 NOTE — Telephone Encounter (Signed)
Phoned patient.  She said that her Rx in November did not have any refills on it.  I called the pharmacy and verified that information to be correct.  Pharmacist added a refill to that original Rx.

## 2012-07-27 NOTE — Telephone Encounter (Signed)
opened in error

## 2012-07-29 NOTE — Telephone Encounter (Signed)
Thanks.  Then I believe this rx should be cancelled out but the med should remain on the med list.  Please do so in a way that won't affect her current rx at the pharmacy (I didn't want to deny it and affect the rx that was handled over the phone).  Thanks.

## 2012-08-28 ENCOUNTER — Other Ambulatory Visit: Payer: Self-pay | Admitting: Family Medicine

## 2012-08-28 NOTE — Telephone Encounter (Signed)
Rx phoned to pharmacy.  

## 2012-08-28 NOTE — Telephone Encounter (Signed)
Electronic refill request

## 2012-08-28 NOTE — Telephone Encounter (Signed)
Please call in.  Thanks.   

## 2012-10-16 ENCOUNTER — Other Ambulatory Visit: Payer: Self-pay | Admitting: *Deleted

## 2012-10-16 NOTE — Telephone Encounter (Signed)
Faxed refill request. Please advise.  

## 2012-10-17 MED ORDER — DIAZEPAM 10 MG PO TABS: 10.0000 mg | ORAL_TABLET | Freq: Every evening | ORAL | Status: DC | PRN

## 2012-10-17 NOTE — Telephone Encounter (Signed)
Please call in

## 2012-10-17 NOTE — Telephone Encounter (Signed)
Medication phoned to [facility] as instructed.

## 2012-11-22 ENCOUNTER — Other Ambulatory Visit: Payer: Self-pay | Admitting: Family Medicine

## 2012-11-22 NOTE — Telephone Encounter (Signed)
Electronic refill request.  Please advise. 

## 2012-11-23 ENCOUNTER — Other Ambulatory Visit: Payer: Self-pay | Admitting: *Deleted

## 2012-11-23 MED ORDER — BUTALBITAL-APAP-CAFFEINE 50-325-40 MG PO TABS: ORAL_TABLET | ORAL | Status: DC

## 2012-11-23 NOTE — Telephone Encounter (Signed)
Last filled 10/17/12, needs rx asap going out of town

## 2012-11-23 NOTE — Telephone Encounter (Signed)
plz phone in. 

## 2012-11-23 NOTE — Telephone Encounter (Signed)
Rx refill called in as directed. 

## 2013-01-02 ENCOUNTER — Other Ambulatory Visit: Payer: Self-pay | Admitting: Family Medicine

## 2013-01-02 NOTE — Telephone Encounter (Signed)
Ok to refill 

## 2013-01-02 NOTE — Telephone Encounter (Signed)
Please call in.  Needs CPE scheduled.  

## 2013-01-03 NOTE — Telephone Encounter (Signed)
Patient will schedule CPE as soon as she gets insurance, shouldn't be long.

## 2013-01-03 NOTE — Telephone Encounter (Signed)
Medication phoned to pharmacy.  

## 2013-02-03 ENCOUNTER — Other Ambulatory Visit: Payer: Self-pay | Admitting: Family Medicine

## 2013-02-04 NOTE — Telephone Encounter (Signed)
Electronic refill request. Patient has not been seen in the office for some time.  Please advise.

## 2013-02-04 NOTE — Telephone Encounter (Signed)
Sent, schedule CPE please.  Thanks.

## 2013-02-05 ENCOUNTER — Other Ambulatory Visit: Payer: Self-pay | Admitting: *Deleted

## 2013-02-05 NOTE — Telephone Encounter (Signed)
Pt left v/m returning call and request cb [phone].

## 2013-02-05 NOTE — Telephone Encounter (Signed)
Left detailed message on voicemail.  

## 2013-02-05 NOTE — Telephone Encounter (Signed)
Returned call, no answer. Left VM.

## 2013-02-13 ENCOUNTER — Other Ambulatory Visit: Payer: Self-pay | Admitting: Family Medicine

## 2013-02-13 NOTE — Telephone Encounter (Signed)
Please call in

## 2013-02-13 NOTE — Telephone Encounter (Signed)
Rx called to pharmacy

## 2013-03-05 ENCOUNTER — Other Ambulatory Visit: Payer: Self-pay | Admitting: *Deleted

## 2013-03-05 MED ORDER — DIAZEPAM 10 MG PO TABS: 10.0000 mg | ORAL_TABLET | Freq: Every evening | ORAL | Status: DC | PRN

## 2013-03-05 NOTE — Telephone Encounter (Signed)
Faxed refill request. Please advise.  

## 2013-03-05 NOTE — Telephone Encounter (Signed)
Please call in

## 2013-03-05 NOTE — Telephone Encounter (Signed)
Medication phoned to pharmacy.  

## 2013-03-26 ENCOUNTER — Other Ambulatory Visit: Payer: Self-pay | Admitting: Family Medicine

## 2013-03-27 ENCOUNTER — Other Ambulatory Visit: Payer: Self-pay | Admitting: Family Medicine

## 2013-03-27 NOTE — Telephone Encounter (Signed)
Please call in

## 2013-03-28 NOTE — Telephone Encounter (Signed)
Jessica Barnes with [facility] called to ck on refill status. Medication phoned to [facility] pharmacy as instructed.

## 2013-03-28 NOTE — Telephone Encounter (Signed)
Medication phoned to pharmacy.  

## 2013-04-16 ENCOUNTER — Encounter: Payer: Self-pay | Admitting: Radiology

## 2013-04-17 ENCOUNTER — Encounter: Payer: Self-pay | Admitting: Family Medicine

## 2013-04-17 ENCOUNTER — Ambulatory Visit (INDEPENDENT_AMBULATORY_CARE_PROVIDER_SITE_OTHER): Payer: Self-pay | Admitting: Family Medicine

## 2013-04-17 VITALS — BP 120/80 | HR 101 | Temp 98.4°F | Ht 63.5 in | Wt 137.0 lb

## 2013-04-17 DIAGNOSIS — F3289 Other specified depressive episodes: Secondary | ICD-10-CM

## 2013-04-17 DIAGNOSIS — F329 Major depressive disorder, single episode, unspecified: Secondary | ICD-10-CM

## 2013-04-17 DIAGNOSIS — E039 Hypothyroidism, unspecified: Secondary | ICD-10-CM

## 2013-04-17 DIAGNOSIS — R51 Headache: Secondary | ICD-10-CM

## 2013-04-17 DIAGNOSIS — Z23 Encounter for immunization: Secondary | ICD-10-CM

## 2013-04-17 MED ORDER — LEVOTHYROXINE SODIUM 88 MCG PO TABS: ORAL_TABLET | ORAL | Status: DC

## 2013-04-17 MED ORDER — BUTALBITAL-APAP-CAFFEINE 50-325-40 MG PO TABS: ORAL_TABLET | ORAL | Status: DC

## 2013-04-17 MED ORDER — FLUOXETINE HCL 40 MG PO CAPS: 80.0000 mg | ORAL_CAPSULE | Freq: Every day | ORAL | Status: DC

## 2013-04-17 NOTE — Progress Notes (Signed)
Annual exam deferred due to concurrent issues.    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  A person embezzled from the company.  They may lose the house and land, have had to sell a lot on personal items.   She is also out of work.  They lost their insurance.  Husband is trying to drive some to make some money in the meantime.  Predictably, her nerves have been worse in the meantime.  She doesn't sleep well.  "I'm trying to take it one day a time."   She is menopausal, with LMP in 11/13.  No SI/HI.  "I need to learn how to live this new normal.  I have depressive episodes, but I push through them."   Her son's history is noted. She is not taking valium daily.   She continues on SSRI.  She is more irritable and worried. "I don't feel like doing the things I used to do."   The headaches have gotten worse in the meantime. She prev had some relief from the meds, used at baseline. Prev with [facility] clinic eval.  Current meds at the only tolerated meds that had helped.    Hypothyroid.  Compliant with meds.  Weight is stable from last year.  No lump in neck, no dysphagia.  Due for TSH.   She wanted a flu shot.    PMH and SH reviewed  ROS: See HPI, otherwise noncontributory.  Meds, vitals, and allergies reviewed.   GEN: nad, alert and oriented, tearful, regains composure.  HEENT: mucous membranes moist NECK: supple w/o LA, no tmg CV: rrr PULM: ctab, no inc wob ABD: soft, +bs EXT: no edema SKIN: no acute rash

## 2013-04-17 NOTE — Assessment & Plan Note (Signed)
Continue current meds 

## 2013-04-17 NOTE — Assessment & Plan Note (Signed)
Inc prozac to 80mg  a day.  No SI/HI.  She'll update me if not improved. She agrees.  Sig social stressors.

## 2013-04-17 NOTE — Patient Instructions (Addendum)
Go to the lab on the way out.  We'll contact you with your lab report. Try the higher dose of prozac and see if that helps.  If not, then notify us.  We can push your colonoscopy, pap smear and mammogram back for now.   Let us know when you want to get those scheduled.  Take care.

## 2013-04-17 NOTE — Assessment & Plan Note (Signed)
Recheck TSH today, no TMG on exam.

## 2013-04-18 ENCOUNTER — Other Ambulatory Visit: Payer: Self-pay | Admitting: Family Medicine

## 2013-04-18 DIAGNOSIS — E039 Hypothyroidism, unspecified: Secondary | ICD-10-CM

## 2013-04-18 LAB — TSH: TSH: 0.22 u[IU]/mL — ABNORMAL LOW (ref 0.35–5.50)

## 2013-04-18 MED ORDER — LEVOTHYROXINE SODIUM 88 MCG PO TABS: ORAL_TABLET | ORAL | Status: DC

## 2013-05-07 ENCOUNTER — Telehealth: Payer: Self-pay

## 2013-05-07 NOTE — Telephone Encounter (Signed)
Have pt make appt with me this week to discuss change in medication for mood.

## 2013-05-07 NOTE — Telephone Encounter (Signed)
Patient called and I let her know that Dr.Bedsole wanted her to make an appointment.  Patient said she'd call back to schedule the appointment.

## 2013-05-07 NOTE — Telephone Encounter (Signed)
Pt left v/m; pt was seen 04/17/13, pt said she trembles all the time, pt needs med for anxiety. No S/I or H/I. Pt increased prozac as instructed. Not sleeping well and needs med for nerves to [facility]. Valium is not effective. Pt had spoke with Dr Para March at 04/17/13 visit.pt request cb.

## 2013-05-10 ENCOUNTER — Encounter: Payer: Self-pay | Admitting: Radiology

## 2013-05-13 ENCOUNTER — Encounter: Payer: Self-pay | Admitting: Family Medicine

## 2013-05-13 ENCOUNTER — Ambulatory Visit (INDEPENDENT_AMBULATORY_CARE_PROVIDER_SITE_OTHER): Payer: Self-pay | Admitting: Family Medicine

## 2013-05-13 VITALS — BP 142/84 | HR 83 | Temp 98.4°F | Wt 135.5 lb

## 2013-05-13 DIAGNOSIS — F411 Generalized anxiety disorder: Secondary | ICD-10-CM

## 2013-05-13 MED ORDER — VENLAFAXINE HCL 75 MG PO TABS: 75.0000 mg | ORAL_TABLET | Freq: Two times a day (BID) | ORAL | Status: DC

## 2013-05-13 NOTE — Patient Instructions (Signed)
Take 40mg  of prozac every other day for 1 week-MWFSun- then stop.  Start taking effexor once a day in the meantime.  After 1 week, increase 1 tab twice a day.  Call me with an update next week.  Take care.

## 2013-05-13 NOTE — Progress Notes (Signed)
Anxiety still continues. She isn't sleeping well.  Went to bed today at 5AM.  "I'm shaking inside."  SSRI was increased to 80mg  prozac w/o much relief.  "I just don't feel anything in life.  I just exist, I don't live."  "Small things seem too bid."  Lack of motivation noted.  Valium doesn't help much at all, not taken consistently at night.  Socially withdrawn.  No SI/HI.   She cut her prozac back to 40mg  a day for the last few days.    She has hot flashes that she attributes to menopause.  "there isn't ever a great time for menopause, but this isn't a good time at all."    Meds, vitals, and allergies reviewed.   ROS: See HPI.  Otherwise, noncontributory.  Speech wnl Flat affect No tremor rrr Judgement wnl

## 2013-05-14 ENCOUNTER — Other Ambulatory Visit: Payer: Self-pay | Admitting: *Deleted

## 2013-05-14 MED ORDER — DIAZEPAM 10 MG PO TABS: 10.0000 mg | ORAL_TABLET | Freq: Every evening | ORAL | Status: DC | PRN

## 2013-05-14 NOTE — Telephone Encounter (Signed)
Medication phoned to pharmacy.  

## 2013-05-14 NOTE — Assessment & Plan Note (Signed)
prozac 40mg  qod for 4 doses.  Start effexor qd in meantime, then BID when prozac stopped. Okay for outpatient f/u.  Continue prn BZD.  Report back with update next week, sooner prn. She agrees.

## 2013-05-14 NOTE — Telephone Encounter (Signed)
Please call in

## 2013-06-18 ENCOUNTER — Other Ambulatory Visit: Payer: Self-pay

## 2013-06-28 ENCOUNTER — Other Ambulatory Visit: Payer: Self-pay | Admitting: Family Medicine

## 2013-06-28 NOTE — Telephone Encounter (Signed)
Is she due for a follow up (see last OV note)?

## 2013-06-30 NOTE — Telephone Encounter (Signed)
Due for lab visit.

## 2013-07-01 NOTE — Telephone Encounter (Signed)
Patient advised.  She is waiting to try to get some insurance before another test or visit.

## 2013-07-09 ENCOUNTER — Other Ambulatory Visit: Payer: Self-pay | Admitting: Family Medicine

## 2013-07-10 ENCOUNTER — Other Ambulatory Visit: Payer: Self-pay | Admitting: *Deleted

## 2013-07-10 MED ORDER — BUTALBITAL-APAP-CAFFEINE 50-325-40 MG PO TABS: ORAL_TABLET | ORAL | Status: DC

## 2013-07-10 NOTE — Telephone Encounter (Signed)
Please call in.  Thanks.   

## 2013-07-10 NOTE — Telephone Encounter (Signed)
Ok to refill 

## 2013-07-10 NOTE — Telephone Encounter (Signed)
rx called into pharmacy

## 2013-07-12 ENCOUNTER — Other Ambulatory Visit: Payer: Self-pay | Admitting: Family Medicine

## 2013-07-12 NOTE — Telephone Encounter (Signed)
Electronic refill request.  Please advise. 

## 2013-07-13 NOTE — Telephone Encounter (Signed)
Was prev signed off.  Call in if not already done. If prev done, then close out this encounter.

## 2013-09-27 ENCOUNTER — Other Ambulatory Visit: Payer: Self-pay | Admitting: Family Medicine

## 2013-09-27 NOTE — Telephone Encounter (Signed)
Electronic refill request. Last filled 07/10/13.  Please advise.

## 2013-09-29 NOTE — Telephone Encounter (Signed)
Please call in

## 2013-09-30 NOTE — Telephone Encounter (Signed)
Medication phoned to pharmacy.  

## 2013-12-17 ENCOUNTER — Ambulatory Visit: Payer: Self-pay | Admitting: Family Medicine

## 2013-12-27 ENCOUNTER — Other Ambulatory Visit: Payer: Self-pay | Admitting: Family Medicine

## 2013-12-27 NOTE — Telephone Encounter (Signed)
Electronic refill request. Last Filled:   90 tablet 2RF on 09/27/13.  Please advise.

## 2013-12-28 NOTE — Telephone Encounter (Signed)
Please call in.  Thanks.   

## 2013-12-30 NOTE — Telephone Encounter (Signed)
Medication phoned to pharmacy.  

## 2014-01-07 ENCOUNTER — Encounter (HOSPITAL_COMMUNITY): Payer: Self-pay | Admitting: Emergency Medicine

## 2014-01-07 DIAGNOSIS — F17200 Nicotine dependence, unspecified, uncomplicated: Secondary | ICD-10-CM | POA: Insufficient documentation

## 2014-01-07 DIAGNOSIS — Y939 Activity, unspecified: Secondary | ICD-10-CM | POA: Insufficient documentation

## 2014-01-07 DIAGNOSIS — J4489 Other specified chronic obstructive pulmonary disease: Secondary | ICD-10-CM | POA: Insufficient documentation

## 2014-01-07 DIAGNOSIS — S99919A Unspecified injury of unspecified ankle, initial encounter: Secondary | ICD-10-CM | POA: Insufficient documentation

## 2014-01-07 DIAGNOSIS — S99929A Unspecified injury of unspecified foot, initial encounter: Secondary | ICD-10-CM | POA: Insufficient documentation

## 2014-01-07 DIAGNOSIS — J449 Chronic obstructive pulmonary disease, unspecified: Secondary | ICD-10-CM | POA: Insufficient documentation

## 2014-01-07 DIAGNOSIS — Y929 Unspecified place or not applicable: Secondary | ICD-10-CM | POA: Insufficient documentation

## 2014-01-07 DIAGNOSIS — IMO0002 Reserved for concepts with insufficient information to code with codable children: Secondary | ICD-10-CM | POA: Insufficient documentation

## 2014-01-07 DIAGNOSIS — S8990XA Unspecified injury of unspecified lower leg, initial encounter: Secondary | ICD-10-CM | POA: Insufficient documentation

## 2014-01-07 NOTE — ED Notes (Signed)
Pt hit right upper leg last night and today leg began to swell.

## 2014-01-08 ENCOUNTER — Emergency Department (HOSPITAL_COMMUNITY)
Admission: EM | Admit: 2014-01-08 | Discharge: 2014-01-08 | Discharge status: Against Medical Advice | Payer: 59 | Attending: Emergency Medicine | Admitting: Emergency Medicine

## 2014-01-08 ENCOUNTER — Emergency Department (HOSPITAL_COMMUNITY)
Admission: EM | Admit: 2014-01-08 | Discharge: 2014-01-08 | Disposition: A | Discharge status: Home / Self Care | Payer: 59 | Attending: Emergency Medicine | Admitting: Emergency Medicine

## 2014-01-08 ENCOUNTER — Encounter (HOSPITAL_COMMUNITY): Payer: Self-pay | Admitting: Emergency Medicine

## 2014-01-08 ENCOUNTER — Emergency Department (HOSPITAL_COMMUNITY): Payer: 59

## 2014-01-08 DIAGNOSIS — Z8709 Personal history of other diseases of the respiratory system: Secondary | ICD-10-CM | POA: Insufficient documentation

## 2014-01-08 DIAGNOSIS — J449 Chronic obstructive pulmonary disease, unspecified: Secondary | ICD-10-CM | POA: Insufficient documentation

## 2014-01-08 DIAGNOSIS — Z8679 Personal history of other diseases of the circulatory system: Secondary | ICD-10-CM | POA: Insufficient documentation

## 2014-01-08 DIAGNOSIS — R52 Pain, unspecified: Secondary | ICD-10-CM | POA: Insufficient documentation

## 2014-01-08 DIAGNOSIS — E785 Hyperlipidemia, unspecified: Secondary | ICD-10-CM | POA: Insufficient documentation

## 2014-01-08 DIAGNOSIS — J4489 Other specified chronic obstructive pulmonary disease: Secondary | ICD-10-CM | POA: Insufficient documentation

## 2014-01-08 DIAGNOSIS — F411 Generalized anxiety disorder: Secondary | ICD-10-CM | POA: Insufficient documentation

## 2014-01-08 DIAGNOSIS — M7989 Other specified soft tissue disorders: Secondary | ICD-10-CM | POA: Insufficient documentation

## 2014-01-08 DIAGNOSIS — F17200 Nicotine dependence, unspecified, uncomplicated: Secondary | ICD-10-CM | POA: Insufficient documentation

## 2014-01-08 DIAGNOSIS — M25551 Pain in right hip: Secondary | ICD-10-CM

## 2014-01-08 DIAGNOSIS — E039 Hypothyroidism, unspecified: Secondary | ICD-10-CM | POA: Insufficient documentation

## 2014-01-08 DIAGNOSIS — M25559 Pain in unspecified hip: Secondary | ICD-10-CM | POA: Insufficient documentation

## 2014-01-08 DIAGNOSIS — Z79899 Other long term (current) drug therapy: Secondary | ICD-10-CM | POA: Insufficient documentation

## 2014-01-08 DIAGNOSIS — E876 Hypokalemia: Secondary | ICD-10-CM | POA: Insufficient documentation

## 2014-01-08 LAB — I-STAT CHEM 8, ED
BUN: 4 mg/dL — ABNORMAL LOW (ref 6–23)
Calcium, Ion: 1.2 mmol/L (ref 1.12–1.23)
Chloride: 98 mEq/L (ref 96–112)
Creatinine, Ser: 0.8 mg/dL (ref 0.50–1.10)
Glucose, Bld: 89 mg/dL (ref 70–99)
HCT: 38 % (ref 36.0–46.0)
Hemoglobin: 12.9 g/dL (ref 12.0–15.0)
Potassium: 3 mEq/L — ABNORMAL LOW (ref 3.7–5.3)
Sodium: 143 mEq/L (ref 137–147)
TCO2: 27 mmol/L (ref 0–100)

## 2014-01-08 LAB — URINALYSIS, ROUTINE W REFLEX MICROSCOPIC
Bilirubin Urine: NEGATIVE
Glucose, UA: NEGATIVE mg/dL
Hgb urine dipstick: NEGATIVE
Ketones, ur: NEGATIVE mg/dL
Leukocytes, UA: NEGATIVE
Nitrite: NEGATIVE
Protein, ur: NEGATIVE mg/dL
Specific Gravity, Urine: 1.009 (ref 1.005–1.030)
Urobilinogen, UA: 0.2 mg/dL (ref 0.0–1.0)
pH: 6 (ref 5.0–8.0)

## 2014-01-08 MED ORDER — POTASSIUM CHLORIDE CRYS ER 20 MEQ PO TBCR
40.0000 meq | EXTENDED_RELEASE_TABLET | Freq: Once | ORAL | Status: AC
  Administered 2014-01-08: 40 meq via ORAL
  Filled 2014-01-08: qty 2

## 2014-01-08 MED ORDER — HYDROCODONE-ACETAMINOPHEN 5-325 MG PO TABS
2.0000 | ORAL_TABLET | Freq: Once | ORAL | Status: AC
  Administered 2014-01-08: 2 via ORAL
  Filled 2014-01-08: qty 2

## 2014-01-08 MED ORDER — KETOROLAC TROMETHAMINE 60 MG/2ML IM SOLN
60.0000 mg | Freq: Once | INTRAMUSCULAR | Status: AC
  Administered 2014-01-08: 60 mg via INTRAMUSCULAR
  Filled 2014-01-08: qty 2

## 2014-01-08 NOTE — ED Notes (Signed)
Pt reports last night she backed into dest hitting R hip. This afternoon she developed rapid swelling to R leg starting at foot and extending up leg. Pt has pain to leg with 1+ pitting edema. Pt denies sob. Pulses present distally. Leg does appear slightly red.

## 2014-01-08 NOTE — Discharge Instructions (Signed)
Take ibuprofen and Tylenol for pain use ice as needed. If swelling or leg does not prove over the next 2-3 days discuss a formal ultrasound of your leg with her primary Dr. Return for worsening symptoms, shortness of breath, chest pain if you pass out or new concerns  If you were given medicines take as directed.  If you are on coumadin or contraceptives realize their levels and effectiveness is altered by many different medicines.  If you have any reaction (rash, tongues swelling, other) to the medicines stop taking and see a physician.   Please follow up as directed and return to the ER or see a physician for new or worsening symptoms.  Thank you. Filed Vitals:   01/08/14 0315 01/08/14 0345 01/08/14 0400 01/08/14 0415  BP: 113/77 106/65 111/65 103/88  Pulse: 68 71 70 70  Temp:      TempSrc:      Resp:      Height:      Weight:      SpO2: 100% 100% 100% 100%

## 2014-01-08 NOTE — ED Provider Notes (Signed)
CSN: [id number]     Arrival date & time 01/08/14  0128 History   First MD Initiated Contact with Patient 01/08/14 [phone]     Chief Complaint  Patient presents with  . Leg Pain     (Consider location/radiation/quality/duration/timing/severity/associated sxs/prior Treatment) HPI Comments: 53 year old female with hypothyroidism, lipids, anxiety presents with right hip pain and right leg swelling. Patient back into a desk hitting her right hip and had pain ecchymosis with pain with palpation since it occurred last evening. Patient is currently worsening swelling from her lower extremity as the injury. No history of leg swelling, liver or cardiac or kidney history.Patient denies blood clot history, active cancer, recent major trauma or surgery, unilateral leg swelling/ pain, recent long travel, hemoptysis or oral contraceptives.   Patient is a 53 y.o. female presenting with leg pain. The history is provided by the patient.  Leg Pain Associated symptoms: no back pain, no fever and no neck pain     Past Medical History  Diagnosis Date  . Anxiety 10/13/96  . COPD (chronic obstructive pulmonary disease) 09/15/05    by x-ray  . Migraine     27 yoa  . Hyperlipidemia 09/15/98  . Hypothyroidism 01/14/96  . NSVD (normal spontaneous vaginal delivery)     x 1  . Chest pain     atypical, hosp for that and depression 8/10-8/11/08  . Depression 10/13/96    hospital 8/10-8/11/08.  notable history: son with TBI after MVA  . PTSD (post-traumatic stress disorder)     likely PTSD after son's MVA/TBI in 2007, s/p counseling   Past Surgical History  Procedure Laterality Date  . Appendectomy      53 years of age   Family History  Problem Relation Age of Onset  . Hypertension Mother   . Colon cancer Father   . Thyroid disease Sister   . Colon cancer Paternal Aunt    History  Substance Use Topics  . Smoking status: Current Some Day Smoker -- 0.50 packs/day for 8 years    Types: Cigarettes  . Smokeless  tobacco: Never Used     Comment: smokes off and on (months in between)  . Alcohol Use: Yes     Comment: very rare, wine   OB History   Grav Para Term Preterm Abortions TAB SAB Ect Mult Living                 Review of Systems  Constitutional: Negative for fever and chills.  Respiratory: Negative for shortness of breath.   Cardiovascular: Positive for leg swelling. Negative for chest pain.  Gastrointestinal: Negative for vomiting and abdominal pain.  Genitourinary: Positive for frequency. Negative for dysuria and flank pain.  Musculoskeletal: Positive for arthralgias. Negative for back pain, neck pain and neck stiffness.  Skin: Negative for rash.  Neurological: Negative for light-headedness and headaches.      Allergies  Sulfamethoxazole-trimethoprim and Codeine  Home Medications   Prior to Admission medications   Medication Sig Start Date End Date Taking? Authorizing Provider  acetaminophen (TYLENOL) 500 MG tablet Take 1,000 mg by mouth every 6 (six) hours as needed for mild pain or headache.   Yes Historical Provider, MD  butalbital-acetaminophen-caffeine (FIORICET, ESGIC) 50-325-40 MG per tablet Take 1 tablet by mouth every 4 (four) hours as needed for headache.   Yes Historical Provider, MD  diazepam (VALIUM) 10 MG tablet Take 1 tablet (10 mg total) by mouth at bedtime as needed for anxiety. 05/14/13  Yes Tonia Ghent,  MD  dimenhyDRINATE (DRAMAMINE) 50 MG tablet Take 50 mg by mouth every 8 (eight) hours as needed.   Yes Historical Provider, MD  FLUoxetine (PROZAC) 40 MG capsule Take 40 mg by mouth every morning.   Yes Historical Provider, MD  levothyroxine (SYNTHROID, LEVOTHROID) 88 MCG tablet 1 tab a day except for 1/2 tab on Sundays 04/18/13  Yes Tonia Ghent, MD  loperamide (IMODIUM A-D) 2 MG tablet Take 4 mg by mouth 4 (four) times daily as needed for diarrhea or loose stools.   Yes Historical Provider, MD  nicotine (NICODERM CQ - DOSED IN MG/24 HOURS) 14 mg/24hr patch  Place 14 mg onto the skin daily as needed (to stop smoking).   Yes Historical Provider, MD   BP 103/88  Pulse 70  Temp(Src) 98.7 F (37.1 C) (Oral)  Resp 20  Ht 5\' 3"  (1.6 m)  Wt 132 lb (59.875 kg)  BMI 23.39 kg/m2  SpO2 100%  LMP 11/30/2011 Physical Exam  Nursing note and vitals reviewed. Constitutional: She appears well-developed and well-nourished. No distress.  HENT:  Head: Normocephalic and atraumatic.  Neck: Normal range of motion.  Pulmonary/Chest: Effort normal.  Musculoskeletal: She exhibits edema and tenderness.  Patient has tenderness and mild ecchymosis right lateral hip and pain with external range of motion. Patient has very mild swelling right lower extremity without signs of infection. Neurovascular intact. No pain medial aspect or calf.  Neurological: She is alert.  Skin: Skin is warm. No rash noted. No erythema. No pallor.  Psychiatric: She has a normal mood and affect.    ED Course  Procedures (including critical care time) Emergency Ultrasound Study:  Limited Duplex of right lower extremity veins  Performed by Dr. Reather Converse Indication: leg pain and/or swelling Visualization of saphenous-femoral junction, proximal femoral vein and popliteal vein regions in transverse plane with full compression visualized.  Interpretation no dvt visualized.  Images archived electronically.    Labs Review Labs Reviewed  URINALYSIS, ROUTINE W REFLEX MICROSCOPIC - Abnormal; Notable for the following:    APPearance HAZY (*)    All other components within normal limits  I-STAT CHEM 8, ED - Abnormal; Notable for the following:    Potassium 3.0 (*)    BUN 4 (*)    All other components within normal limits    Imaging Review No results found.   EKG Interpretation None      MDM   Final diagnoses:  Acute pain of right hip  Right leg swelling  Hypokalemia   dysuria  Clinically musculoskeletal injury. X-ray to rule out fracture. Toradol and Norco given for  pain. Patient very low risk for DVT. Bedside ultrasound done and no significant DVT appreciated from proximal femoral vein to popliteal vein on the right. Checking kidney function and discussed close followup outpatient for formal leg ultrasound and further evaluation if no improvement in 2-3 days. Oral potassium given for mild hypokalemia X-rays reviewed no acute fracture. Patient well-appearing in ER. Results and differential diagnosis were discussed with the patient/parent/guardian. Close follow up outpatient was discussed, comfortable with the plan.   Filed Vitals:   01/08/14 0315 01/08/14 0345 01/08/14 0400 01/08/14 0415  BP: 113/77 106/65 111/65 103/88  Pulse: 68 71 70 70  Temp:      TempSrc:      Resp:      Height:      Weight:      SpO2: 100% 100% 100% 100%         Mariea Clonts,  MD 01/08/14 1638

## 2014-01-08 NOTE — ED Notes (Signed)
Dr. Reather Converse at bedside with ultrasound of pt right leg.

## 2014-01-10 ENCOUNTER — Encounter: Payer: Self-pay | Admitting: Family Medicine

## 2014-01-10 ENCOUNTER — Ambulatory Visit (INDEPENDENT_AMBULATORY_CARE_PROVIDER_SITE_OTHER): Payer: 59 | Admitting: Family Medicine

## 2014-01-10 VITALS — BP 112/58 | HR 91 | Temp 98.3°F | Wt 132.2 lb

## 2014-01-10 DIAGNOSIS — M25569 Pain in unspecified knee: Secondary | ICD-10-CM

## 2014-01-10 DIAGNOSIS — F329 Major depressive disorder, single episode, unspecified: Secondary | ICD-10-CM

## 2014-01-10 DIAGNOSIS — E039 Hypothyroidism, unspecified: Secondary | ICD-10-CM

## 2014-01-10 DIAGNOSIS — F3289 Other specified depressive episodes: Secondary | ICD-10-CM

## 2014-01-10 DIAGNOSIS — R0789 Other chest pain: Secondary | ICD-10-CM

## 2014-01-10 MED ORDER — FLUOXETINE HCL 20 MG PO TABS: 20.0000 mg | ORAL_TABLET | Freq: Every day | ORAL | Status: DC

## 2014-01-10 NOTE — Patient Instructions (Signed)
Go to the lab on the way out.  We'll contact you with your lab report. Try taking zantac 150mg  a day for the presumed heartburn.  Take an extra 20mg  of prozac to equal 60mg  a day.  Take care.

## 2014-01-10 NOTE — Progress Notes (Signed)
Pre visit review using our clinic review tool, if applicable. No additional management support is needed unless otherwise documented below in the visit note.   She hit her R leg/hip. Then had R leg swelling the day after.  ER notes reviewed.  The swelling is better in the meantime overall.  The can get worse as the day goes on, unless elevated.  No clot seen on u/s done in the ER.  Her R foot prev had a bruise, resolved now.    Some SSCP.  Not exertional, can exert w/o sx.  Intermittent, irregular onset.  Can happen at rest or with exertion, but may have periods without sx during rest or exertion.  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 They can keep their home.  It has been rough for her and her husband.  She took some TUMS w/o much relief.   Hypothyroidism.  Due for repeat TSH.    Depression.  She changed back from effexor to prozac, she can tolerate prozac better, but 40mg  a day isn't fully controlling her anxiety.  She couldn't tolerate 80mg  of prozac a day. She hasn't tired 60mg  of prozac a day yet.  Her religious faith is comforting for her.  No SI/HI.   Meds, vitals, and allergies reviewed.   ROS: See HPI.  Otherwise, noncontributory.  GEN: nad, alert and oriented HEENT: mucous membranes moist NECK: supple w/o LA, no tmg CV: rrr.   PULM: ctab, no inc wob, chest wall ttp at the lower sternum ABD: soft, +bs EXT: no edema SKIN: no acute rash R leg ttp near the greater troch.  No pitting edema in the BLE Gait wnl Affect wnl

## 2014-01-11 ENCOUNTER — Other Ambulatory Visit: Payer: Self-pay | Admitting: Family Medicine

## 2014-01-11 DIAGNOSIS — M25569 Pain in unspecified knee: Secondary | ICD-10-CM | POA: Insufficient documentation

## 2014-01-11 DIAGNOSIS — R0789 Other chest pain: Secondary | ICD-10-CM | POA: Insufficient documentation

## 2014-01-11 DIAGNOSIS — E039 Hypothyroidism, unspecified: Secondary | ICD-10-CM

## 2014-01-11 LAB — TSH: TSH: 0.083 u[IU]/mL — ABNORMAL LOW (ref 0.350–4.500)

## 2014-01-11 MED ORDER — LEVOTHYROXINE SODIUM 88 MCG PO TABS: ORAL_TABLET | ORAL | Status: DC

## 2014-01-11 NOTE — Assessment & Plan Note (Signed)
likely heartbutn/GERD related, worsened with upheaval at home.  Can use OTC H2 vs PPI and report back as needed. She agrees.  This is not at all likely to be cardiac given her hx.

## 2014-01-11 NOTE — Assessment & Plan Note (Signed)
Sig upheaval. Okay for outpatient f/u.  Inc prozac to 40+20 = 60mg  a day and report back as needed.  She agrees.

## 2014-01-11 NOTE — Assessment & Plan Note (Signed)
See notes on labs. 

## 2014-01-11 NOTE — Assessment & Plan Note (Signed)
No need for imaging at this point, highly unlikely to be any dx other than a benign contusion/MSK injury that should resolve.

## 2014-01-29 ENCOUNTER — Other Ambulatory Visit: Payer: Self-pay

## 2014-03-27 ENCOUNTER — Other Ambulatory Visit: Payer: Self-pay | Admitting: Family Medicine

## 2014-03-27 NOTE — Telephone Encounter (Signed)
Electronic refill request.  I think the last fill was 01/08/14, not sure.  Please advise.

## 2014-03-27 NOTE — Telephone Encounter (Signed)
Looks to be before that.  Please call in.  Thanks.

## 2014-03-28 ENCOUNTER — Other Ambulatory Visit: Payer: Self-pay | Admitting: Family Medicine

## 2014-03-28 NOTE — Telephone Encounter (Signed)
Medication phoned to pharmacy.  

## 2014-05-13 ENCOUNTER — Other Ambulatory Visit: Payer: Self-pay | Admitting: Family Medicine

## 2014-05-13 NOTE — Telephone Encounter (Signed)
Received refill request electronically from pharmacy. Last office visit 01/10/14. Request from pharmacy does not match medication sheet. Is it okay to refill as requested?

## 2014-05-14 NOTE — Telephone Encounter (Signed)
Please verify this with patient.  I thought she was on 60mg  a day.  Let me know. Thanks.

## 2014-05-14 NOTE — Telephone Encounter (Signed)
Spoke to patient and was advised that she is now only taking 40 mg daily.

## 2014-05-14 NOTE — Telephone Encounter (Signed)
Sent!

## 2014-05-29 ENCOUNTER — Other Ambulatory Visit: Payer: Self-pay | Admitting: Family Medicine

## 2014-05-29 NOTE — Telephone Encounter (Signed)
Received refill request electronically from pharmacy. Last refill 03/28/14 #90/1, last office visit 01/10/14. Is it okay to refill medication?

## 2014-05-30 ENCOUNTER — Other Ambulatory Visit: Payer: Self-pay | Admitting: Family Medicine

## 2014-05-30 MED ORDER — BUTALBITAL-APAP-CAFFEINE 50-325-40 MG PO TABS: ORAL_TABLET | ORAL | Status: DC

## 2014-05-30 NOTE — Telephone Encounter (Signed)
Rx printed and will be faxed to requested pharmacy before end of day, today.

## 2014-05-30 NOTE — Telephone Encounter (Signed)
Please call in.  Thanks.   

## 2014-07-16 ENCOUNTER — Ambulatory Visit (INDEPENDENT_AMBULATORY_CARE_PROVIDER_SITE_OTHER): Payer: 59 | Admitting: Family Medicine

## 2014-07-16 ENCOUNTER — Ambulatory Visit: Payer: 59 | Admitting: Family Medicine

## 2014-07-16 ENCOUNTER — Encounter: Payer: Self-pay | Admitting: Family Medicine

## 2014-07-16 ENCOUNTER — Other Ambulatory Visit: Payer: Self-pay | Admitting: Family Medicine

## 2014-07-16 VITALS — BP 110/78 | HR 102 | Temp 98.9°F | Wt 132.5 lb

## 2014-07-16 DIAGNOSIS — E039 Hypothyroidism, unspecified: Secondary | ICD-10-CM

## 2014-07-16 DIAGNOSIS — N951 Menopausal and female climacteric states: Secondary | ICD-10-CM

## 2014-07-16 DIAGNOSIS — F32A Depression, unspecified: Secondary | ICD-10-CM

## 2014-07-16 DIAGNOSIS — F329 Major depressive disorder, single episode, unspecified: Secondary | ICD-10-CM

## 2014-07-16 LAB — TSH: TSH: 1.81 u[IU]/mL (ref 0.35–4.50)

## 2014-07-16 MED ORDER — ESTROGENS CONJUGATED 0.625 MG PO TABS: 0.6250 mg | ORAL_TABLET | Freq: Every day | ORAL | Status: DC

## 2014-07-16 MED ORDER — PROGESTERONE 200 MG PO CAPS: 200.0000 mg | ORAL_CAPSULE | Freq: Every day | ORAL | Status: DC

## 2014-07-16 MED ORDER — DIAZEPAM 10 MG PO TABS: 10.0000 mg | ORAL_TABLET | Freq: Every evening | ORAL | Status: DC | PRN

## 2014-07-16 NOTE — Progress Notes (Signed)
Pre visit review using our clinic review tool, if applicable. No additional management support is needed unless otherwise documented below in the visit note.  She is still on 40mg  of prozac.  Had been taking valium at night, occ use, not nightly.  "I used them when I needed them."    Due for f/u TSH.  D/w pt.  Dose was prev lowered. No ADE on meds.    "I think I'm in the heat of menopause.  My mother and grandmother had trouble with hot flashes."  Worst at night but had daytime sx also.   She had been on premarin 0.625 prev per gyn, with relief, then went off med and the sx returned.   She's been having sx for years.   More pain with sex recently, even with using topical meds/lube.   D/w pt about HRT risk, cancer risk.  She has no personal/FH of gyn/breast cancer, no DVT. She stopped smoking.   Meds, vitals, and allergies reviewed.   ROS: See HPI.  Otherwise, noncontributory.  nad ncat Mmm TM wnl Neck supple, no TMG rrr ctab abd soft Ext w/o edema.

## 2014-07-16 NOTE — Patient Instructions (Signed)
Go to the lab on the way out.  We'll contact you with your lab report (TSH). Restart the premarin and progesterone.  Notify me if that isn't helping.  We'll go from there.   You'll need to schedule a pap smear in a few months.  Call about a mammogram.

## 2014-07-17 ENCOUNTER — Encounter: Payer: Self-pay | Admitting: *Deleted

## 2014-07-17 NOTE — Assessment & Plan Note (Signed)
Sig troubles, in spite of SSRI use.  She had been on premarin 0.625 prev per gyn, with relief, then went off med and the sx returned.   She's been having sx for years.   More pain with sex recently, even with using topical meds/lube.   D/w pt about HRT risk, cancer risk.  She has no personal/FH of gyn/breast cancer, no DVT. She stopped smoking.  Restart premarin with progesterone rx.   She'll update me as needed.  >25 minutes spent in face to face time with patient, >50% spent in counselling or coordination of care.

## 2014-07-17 NOTE — Assessment & Plan Note (Signed)
See notes on labs. 

## 2014-07-17 NOTE — Assessment & Plan Note (Signed)
Continue ssri with prn BZD.  No ADE on meds.

## 2014-07-31 ENCOUNTER — Other Ambulatory Visit: Payer: Self-pay | Admitting: Family Medicine

## 2014-07-31 NOTE — Telephone Encounter (Signed)
Electronic refill request. Last Filled:    90 tablet 1RF on 05/30/2014  Please advise.

## 2014-08-01 NOTE — Telephone Encounter (Signed)
Medication phoned to pharmacy.  

## 2014-08-01 NOTE — Telephone Encounter (Signed)
Please call in.  Thanks.   

## 2014-09-18 ENCOUNTER — Other Ambulatory Visit: Payer: Self-pay | Admitting: Family Medicine

## 2014-09-18 NOTE — Telephone Encounter (Signed)
Please call in.  Thanks.   

## 2014-09-18 NOTE — Telephone Encounter (Signed)
Electronic refill request. Last Filled:    90 tablet 1 RF on 08/01/2014  Please advise.

## 2014-09-19 ENCOUNTER — Other Ambulatory Visit: Payer: Self-pay | Admitting: Family Medicine

## 2014-09-19 NOTE — Telephone Encounter (Signed)
Medication phoned to pharmacy.  

## 2014-10-22 ENCOUNTER — Other Ambulatory Visit: Payer: Self-pay | Admitting: Family Medicine

## 2014-10-22 NOTE — Telephone Encounter (Signed)
Sent, schedule a physical this fall.  Thanks.

## 2014-10-22 NOTE — Telephone Encounter (Signed)
Left message for patient to call and schedule physical

## 2014-10-22 NOTE — Telephone Encounter (Signed)
Last filled 05/15/15--#90 with 1 refill--last OV 07/2014, no upcoming OV--please advise

## 2014-10-25 ENCOUNTER — Emergency Department: Payer: Self-pay | Admitting: Emergency Medicine

## 2014-11-18 ENCOUNTER — Other Ambulatory Visit: Payer: Self-pay | Admitting: Family Medicine

## 2014-11-18 NOTE — Telephone Encounter (Signed)
Electronic refill request. Last Filled:    90 tablet 1 RF on 09/18/2014  Please advise.

## 2014-11-19 NOTE — Telephone Encounter (Signed)
Rx called to pharmacy as instructed. 

## 2014-11-19 NOTE — Telephone Encounter (Signed)
Please call in.  Thanks.   

## 2014-12-22 ENCOUNTER — Other Ambulatory Visit: Payer: Self-pay | Admitting: Family Medicine

## 2015-01-02 ENCOUNTER — Other Ambulatory Visit: Payer: Self-pay | Admitting: Family Medicine

## 2015-01-02 NOTE — Telephone Encounter (Signed)
Please call in.  Thanks.   

## 2015-01-02 NOTE — Telephone Encounter (Signed)
Last OV and last filled 07/16/2014--please advise

## 2015-01-02 NOTE — Telephone Encounter (Signed)
Rx called in to pharmacy. 

## 2015-01-08 ENCOUNTER — Ambulatory Visit: Payer: Self-pay | Admitting: Family Medicine

## 2015-01-13 ENCOUNTER — Ambulatory Visit: Payer: Self-pay | Admitting: Family Medicine

## 2015-01-16 ENCOUNTER — Telehealth: Payer: Self-pay

## 2015-01-16 NOTE — Telephone Encounter (Signed)
Left message on voicemail.

## 2015-01-22 ENCOUNTER — Other Ambulatory Visit: Payer: Self-pay | Admitting: Family Medicine

## 2015-01-22 NOTE — Telephone Encounter (Signed)
Electronic refill request. Last Filled:    90 tablet 1 RF on 11/19/2014  Please advise.

## 2015-01-22 NOTE — Telephone Encounter (Signed)
Medication phoned to pharmacy.  

## 2015-01-22 NOTE — Telephone Encounter (Signed)
Please call in.  Thanks.   

## 2015-02-02 ENCOUNTER — Other Ambulatory Visit: Payer: Self-pay | Admitting: Family Medicine

## 2015-03-10 ENCOUNTER — Encounter: Payer: Self-pay | Admitting: Family Medicine

## 2015-03-10 ENCOUNTER — Telehealth: Payer: Self-pay

## 2015-03-10 ENCOUNTER — Ambulatory Visit (INDEPENDENT_AMBULATORY_CARE_PROVIDER_SITE_OTHER): Payer: 59 | Admitting: Family Medicine

## 2015-03-10 VITALS — BP 100/68 | HR 70 | Temp 98.7°F | Wt 129.5 lb

## 2015-03-10 DIAGNOSIS — N951 Menopausal and female climacteric states: Secondary | ICD-10-CM

## 2015-03-10 MED ORDER — ESTROGENS CONJUGATED 0.9 MG PO TABS: 0.9000 mg | ORAL_TABLET | Freq: Every day | ORAL | Status: DC

## 2015-03-10 NOTE — Progress Notes (Signed)
Pre visit review using our clinic review tool, if applicable. No additional management support is needed unless otherwise documented below in the visit note.  She has much less hot flashes on HRT but has a lot of pain with intercourse, sleep changes, mood changes, all that changed about 1 month ago-prior to that she was doing well on meds.  No VB.  Not smoking, no FH/personal hx of DVT.  D/w pt about risks and benefits of HRT prev.    Meds, vitals, and allergies reviewed.   ROS: See HPI.  Otherwise, noncontributory.  nad ncat Mmm rrr ctab abd soft, not ttp Ext w/o edema.

## 2015-03-10 NOTE — Patient Instructions (Addendum)
Try the higher dose of premarin and see how that helps.  Keep taking the progesterone with it.  Let me know if you don't improve.   Take care. Good to see you.

## 2015-03-11 NOTE — Assessment & Plan Note (Signed)
Likely undertreated.  She has much less hot flashes on HRT but has a lot of pain with intercourse, sleep changes, mood changes, all that changed about 1 month ago-prior to that she was doing well on meds.  No VB.  Not smoking, no FH/personal hx of DVT.  D/w pt about risks and benefits of HRT prev.   Inc premarin to 0.9mg  and she'll update me as needed.

## 2015-03-11 NOTE — Telephone Encounter (Signed)
Error

## 2015-03-21 ENCOUNTER — Other Ambulatory Visit: Payer: Self-pay | Admitting: Family Medicine

## 2015-03-23 NOTE — Telephone Encounter (Signed)
Electronic refill request. Last Filled:    90 tablet 1 RF on 01/22/2015  Dr. Damita Dunnings out of office today,  Please advise.

## 2015-03-23 NOTE — Telephone Encounter (Signed)
This is correct.  Unfortunate situation for patient.  Had prev headache clinic eval. Current med is the only tolerated med that had helped.  Please call in.  Thanks.

## 2015-03-23 NOTE — Telephone Encounter (Signed)
This px I want to run by Dr Damita Dunnings first - it looks like she uses 31 per month? I will cc to him also

## 2015-03-23 NOTE — Telephone Encounter (Signed)
Medication phoned to pharmacy.  

## 2015-04-22 ENCOUNTER — Other Ambulatory Visit: Payer: Self-pay | Admitting: Family Medicine

## 2015-05-05 ENCOUNTER — Other Ambulatory Visit: Payer: Self-pay | Admitting: Family Medicine

## 2015-05-05 NOTE — Telephone Encounter (Signed)
Last refilled 03/23/15 for #90 with 1 refill. Last office visit 03/10/15. Ok to refill?

## 2015-05-06 NOTE — Telephone Encounter (Signed)
Patient notified as instructed by telephone and verbalized understanding. Was advised by patient that she has not gotten all that the pharmacy states that she has. Patient stated that she can not come in this week because she has had a migraine that she is getting over now. Advised patient that #30 will be called to the pharmacy today for her. Appointment scheduled for 05/13/15 with Dr. Damita Dunnings.  FYI Called Rx to [place] at [facility] and was advised that patient got #90 on 03/24/15, #30 on 04/17/15, 04/22/15 and 04/28/15.

## 2015-05-06 NOTE — Telephone Encounter (Signed)
Please call in #30, if needing that many needs OV set up to discuss options.

## 2015-05-06 NOTE — Telephone Encounter (Signed)
Received refill request electronically from pharmacy Last refill 03/23/15 #90/1 Last office visit 03/10/15 Is it okay to refill?

## 2015-05-06 NOTE — Telephone Encounter (Signed)
This is early.  She should have a refill left.

## 2015-05-06 NOTE — Telephone Encounter (Signed)
This is early. She should have a refill left.  See prev note.

## 2015-05-06 NOTE — Telephone Encounter (Signed)
Noted. Thanks.

## 2015-05-06 NOTE — Telephone Encounter (Signed)
Spoke to [place] at the pharmacy and was advised that the patient has used all of her refills up. Chelsea stated that the patient usually gets #30 at a time and got #30 on 04/28/15. Please advise.

## 2015-05-13 ENCOUNTER — Ambulatory Visit: Payer: Commercial Managed Care - PPO | Admitting: Family Medicine

## 2015-05-26 ENCOUNTER — Other Ambulatory Visit: Payer: Self-pay | Admitting: Family Medicine

## 2015-05-26 NOTE — Telephone Encounter (Signed)
Patient notified appointment needed prior to additional refills.  Follow up scheduled 05/29/15 at 2 pm.  Okay to fill until this time?

## 2015-05-27 NOTE — Telephone Encounter (Signed)
I sent it for now.  Thanks.

## 2015-05-29 ENCOUNTER — Encounter: Payer: Self-pay | Admitting: Family Medicine

## 2015-05-29 ENCOUNTER — Ambulatory Visit (INDEPENDENT_AMBULATORY_CARE_PROVIDER_SITE_OTHER): Payer: Commercial Managed Care - PPO | Admitting: Family Medicine

## 2015-05-29 VITALS — BP 138/86 | HR 90 | Temp 98.4°F | Wt 122.5 lb

## 2015-05-29 DIAGNOSIS — R51 Headache: Secondary | ICD-10-CM | POA: Diagnosis not present

## 2015-05-29 DIAGNOSIS — Z23 Encounter for immunization: Secondary | ICD-10-CM

## 2015-05-29 DIAGNOSIS — R519 Headache, unspecified: Secondary | ICD-10-CM

## 2015-05-29 MED ORDER — FLUOXETINE HCL 20 MG PO TABS: 60.0000 mg | ORAL_TABLET | Freq: Every day | ORAL | Status: DC

## 2015-05-29 MED ORDER — BUTALBITAL-APAP-CAFFEINE 50-325-40 MG PO TABS: ORAL_TABLET | ORAL | Status: DC

## 2015-05-29 MED ORDER — DIAZEPAM 10 MG PO TABS: 10.0000 mg | ORAL_TABLET | Freq: Every evening | ORAL | Status: DC | PRN

## 2015-05-29 NOTE — Assessment & Plan Note (Signed)
Initial prozac rx had a sig error, reprinted all 3 rxs, given to patient.  D/w pt about risk benefit.  It appears that HA are worse with high stress situations, not from inc in HRT.   D/w pt about options.  Continue prozac but inc to 60mg  a day, continue prn BZD for anxiety.  Continue fioricet prn.  Continue HRT for now.   At this point, given the longstanding nature of her HA and the prev failure with other tx, it is likely best to continue with HRT, prn fioricet and SSRI/BZD.   She agrees.   Okay for outpatient f/u.

## 2015-05-29 NOTE — Patient Instructions (Signed)
Up the prozac to 60mg  a day.  That may help some with the headaches and your mood.  Take care.  Update me as needed.  Glad to see you.

## 2015-05-29 NOTE — Progress Notes (Signed)
Pre visit review using our clinic review tool, if applicable. No additional management support is needed unless otherwise documented below in the visit note.  She is in a high stress situation with caring for her father.  Her dog died this week.  It's been tough for her recently.  D/w pt.    HRT.  Had talked with patient about risk/benefit prev.  She felt better with the higher dose in terms of hot flashes.  She hasn't been intimate in the meantime, with all of the upheaval.  She didn't want to even try to get off HRT at this point- d/w pt re: risk and benefits.    HA.  She had seen the HA clinic prev.  She had been needing fioricet more often than prev, that she had attributed to the higher stress and tearfulness.  Tylenol and ibuprofen didn't help.  L sided HA.  Throbbing.  Photophobia.  Fioricet was the only med she had been able to take with some relief.    Meds, vitals, and allergies reviewed.   ROS: See HPI.  Otherwise, noncontributory.  GEN: nad, alert and oriented HEENT: mucous membranes moist NECK: supple w/o LA CV: rrr.  PULM: ctab, no inc wob ABD: soft, +bs EXT: no edema

## 2015-06-23 ENCOUNTER — Other Ambulatory Visit: Payer: Self-pay | Admitting: Family Medicine

## 2015-07-30 ENCOUNTER — Other Ambulatory Visit: Payer: Self-pay | Admitting: Family Medicine

## 2015-07-30 NOTE — Telephone Encounter (Signed)
Please call in.  Thanks.   

## 2015-07-30 NOTE — Telephone Encounter (Signed)
Electronic refill request.  Last Filled:    90 tablet 1 05/29/2015  Please advise.

## 2015-07-30 NOTE — Telephone Encounter (Signed)
Medication phoned to pharmacy.  

## 2015-08-08 ENCOUNTER — Other Ambulatory Visit: Payer: Self-pay | Admitting: Family Medicine

## 2015-08-11 NOTE — Telephone Encounter (Signed)
Electronic refill request. Last Filled:    90 capsule 3 07/16/2014  Last office visit:   05/29/15  Please advise.

## 2015-08-12 NOTE — Telephone Encounter (Signed)
Sent. Thanks.   

## 2015-08-13 ENCOUNTER — Other Ambulatory Visit: Payer: Self-pay | Admitting: Family Medicine

## 2015-08-13 NOTE — Telephone Encounter (Signed)
Electronic refill request.  Last Filled:    30 tablet 1 05/29/2015

## 2015-08-14 NOTE — Telephone Encounter (Signed)
Please call in.  Thanks.   

## 2015-08-14 NOTE — Telephone Encounter (Signed)
Medication phoned to pharmacy.  

## 2015-08-24 ENCOUNTER — Other Ambulatory Visit: Payer: Self-pay | Admitting: Family Medicine

## 2015-09-07 ENCOUNTER — Other Ambulatory Visit: Payer: Self-pay | Admitting: Family Medicine

## 2015-09-17 ENCOUNTER — Other Ambulatory Visit: Payer: Self-pay | Admitting: Family Medicine

## 2015-09-17 NOTE — Telephone Encounter (Signed)
Please call in.  Thanks.   

## 2015-09-17 NOTE — Telephone Encounter (Signed)
Medication phoned to pharmacy.  

## 2015-09-17 NOTE — Telephone Encounter (Signed)
Electronic refill request. Last Filled:    90 tablet 1 07/30/2015  Last office visit:   05/29/15

## 2015-10-09 ENCOUNTER — Other Ambulatory Visit: Payer: Self-pay | Admitting: Family Medicine

## 2015-10-09 NOTE — Telephone Encounter (Signed)
Last filled 08/14/2015 with 1 refill-- no upcoming appts--last f/u appt 05/2015--please advise

## 2015-10-09 NOTE — Telephone Encounter (Signed)
Please call in.  Thanks.   

## 2015-10-09 NOTE — Telephone Encounter (Signed)
Medication phoned to pharmacy.  

## 2015-10-28 ENCOUNTER — Other Ambulatory Visit: Payer: Self-pay | Admitting: Family Medicine

## 2015-11-09 ENCOUNTER — Other Ambulatory Visit: Payer: Self-pay

## 2015-11-09 NOTE — Telephone Encounter (Signed)
Last filled 09/17/15 #90 with 1 refill--please advise

## 2015-11-10 MED ORDER — BUTALBITAL-APAP-CAFFEINE 50-325-40 MG PO TABS: ORAL_TABLET | ORAL | Status: DC

## 2015-11-10 NOTE — Telephone Encounter (Signed)
Medication phoned to pharmacy.  

## 2015-11-10 NOTE — Telephone Encounter (Signed)
Please call in.  Thanks.   

## 2015-11-30 ENCOUNTER — Telehealth: Payer: Self-pay | Admitting: Family Medicine

## 2015-11-30 NOTE — Telephone Encounter (Signed)
Pt's generic rx for Prozac 20mg  was written for tablets.  [facility] wants to know if they can fill the capsules instead of the tablets due to cost.  The capsules are much cheaper. Gerald Stabs with [facility]:  [phone]

## 2015-12-01 MED ORDER — FLUOXETINE HCL 20 MG PO CAPS: 60.0000 mg | ORAL_CAPSULE | Freq: Every day | ORAL | Status: DC

## 2015-12-01 NOTE — Telephone Encounter (Signed)
Sent as capsules.  Thanks.

## 2015-12-04 ENCOUNTER — Other Ambulatory Visit: Payer: Self-pay | Admitting: *Deleted

## 2015-12-04 NOTE — Telephone Encounter (Signed)
Faxed refill request. Last Filled:    90 tablet 1 11/10/2015  Please advise.

## 2015-12-06 MED ORDER — BUTALBITAL-APAP-CAFFEINE 50-325-40 MG PO TABS: ORAL_TABLET | ORAL | Status: DC

## 2015-12-06 NOTE — Telephone Encounter (Signed)
Please call in. She needs to try to taper frequency down as tolerated.   Or we need to talk about other options for her chronic conditions, HA and o/w.  Thanks.

## 2015-12-07 NOTE — Telephone Encounter (Signed)
Left detailed message on voicemail.  Medication phoned to pharmacy.  

## 2015-12-14 ENCOUNTER — Other Ambulatory Visit: Payer: Self-pay | Admitting: *Deleted

## 2015-12-14 DIAGNOSIS — E039 Hypothyroidism, unspecified: Secondary | ICD-10-CM

## 2015-12-14 MED ORDER — LEVOTHYROXINE SODIUM 88 MCG PO TABS: ORAL_TABLET | ORAL | Status: DC

## 2015-12-14 NOTE — Telephone Encounter (Signed)
Needs f/u tsh done.  Lab order in.  rx sent.  Thanks.

## 2015-12-14 NOTE — Telephone Encounter (Signed)
Faxed refill request.  No TSH in 2016.  Please advise.  Last Filled:    90 tablet 1 02/02/2015

## 2015-12-15 NOTE — Telephone Encounter (Signed)
Left detailed message on voicemail.  

## 2015-12-28 ENCOUNTER — Other Ambulatory Visit: Payer: Self-pay | Admitting: *Deleted

## 2015-12-28 MED ORDER — DIAZEPAM 10 MG PO TABS: 10.0000 mg | ORAL_TABLET | Freq: Every evening | ORAL | Status: DC | PRN

## 2015-12-28 NOTE — Telephone Encounter (Signed)
Faxed refill request. Last Filled:    30 tablet 1 10/09/2015  Last office visit:   05/29/15  Please advise.

## 2015-12-28 NOTE — Telephone Encounter (Signed)
Please call in.  Thanks.   

## 2015-12-29 NOTE — Telephone Encounter (Signed)
Medication phoned to pharmacy.  

## 2016-01-01 ENCOUNTER — Ambulatory Visit: Payer: Commercial Managed Care - PPO | Admitting: Family Medicine

## 2016-01-15 ENCOUNTER — Ambulatory Visit: Payer: Commercial Managed Care - PPO | Admitting: Family Medicine

## 2016-01-15 DIAGNOSIS — Z0289 Encounter for other administrative examinations: Secondary | ICD-10-CM

## 2016-01-20 ENCOUNTER — Ambulatory Visit: Payer: Commercial Managed Care - PPO | Admitting: Family Medicine

## 2016-01-28 ENCOUNTER — Ambulatory Visit: Payer: Commercial Managed Care - PPO | Admitting: Family Medicine

## 2016-02-10 ENCOUNTER — Ambulatory Visit (INDEPENDENT_AMBULATORY_CARE_PROVIDER_SITE_OTHER): Payer: Commercial Managed Care - PPO | Admitting: Family Medicine

## 2016-02-10 ENCOUNTER — Encounter: Payer: Self-pay | Admitting: Family Medicine

## 2016-02-10 VITALS — BP 112/74 | HR 82 | Temp 98.7°F | Ht 63.0 in | Wt 119.5 lb

## 2016-02-10 DIAGNOSIS — R519 Headache, unspecified: Secondary | ICD-10-CM

## 2016-02-10 DIAGNOSIS — E039 Hypothyroidism, unspecified: Secondary | ICD-10-CM

## 2016-02-10 DIAGNOSIS — F329 Major depressive disorder, single episode, unspecified: Secondary | ICD-10-CM

## 2016-02-10 DIAGNOSIS — N941 Unspecified dyspareunia: Secondary | ICD-10-CM | POA: Insufficient documentation

## 2016-02-10 DIAGNOSIS — R51 Headache: Secondary | ICD-10-CM | POA: Diagnosis not present

## 2016-02-10 DIAGNOSIS — F32A Depression, unspecified: Secondary | ICD-10-CM

## 2016-02-10 MED ORDER — BUTALBITAL-APAP-CAFFEINE 50-325-40 MG PO TABS: ORAL_TABLET | ORAL | Status: DC

## 2016-02-10 MED ORDER — DIAZEPAM 10 MG PO TABS: 10.0000 mg | ORAL_TABLET | Freq: Every evening | ORAL | Status: DC | PRN

## 2016-02-10 NOTE — Patient Instructions (Addendum)
You can call for a mammogram at these locations:  [facility].  [place] [facility] [place] [place] [address] [place]  Go to the lab on the way out.  We'll contact you with your lab report. Check on hep C virus testing coverage.   Take care.  Glad to see you.

## 2016-02-10 NOTE — Assessment & Plan Note (Signed)
Worse with recent stressors.  Continue fioricet for now.  No ADE on med.

## 2016-02-10 NOTE — Progress Notes (Signed)
Prescriptions called to [facility] as instructed.

## 2016-02-10 NOTE — Assessment & Plan Note (Signed)
Recheck TSH pending, no tmg on exam.

## 2016-02-10 NOTE — Assessment & Plan Note (Signed)
Continue SSRI and prn BZD.  No ADE on meds.  D/w pt about her situation at home, safe at home but clearly worried.

## 2016-02-10 NOTE — Progress Notes (Signed)
Pre visit review using our clinic review tool, if applicable. No additional management support is needed unless otherwise documented below in the visit note.  Due for pap and mammogram.  D/w pt.  She'll check on coverage.    She had a tetanus shot in the meantime, so she thinks she is up to date.   Hypothyroid.  Due for TSH.  Complaint.  No neck mass, no dysphagia.  TSH pending.    HIV and HCV testing d/w pt.  She'll check on coverage.   Headaches.  Still frequent.  She had seen the HA clinic prev. She had been needing fioricet more often than prev, that she had attributed to the higher stress and tearfulness. (family upheaval- she is safe at home but her son has a pending divorce and she is worried about him).  Tylenol and ibuprofen didn't help. L sided usually with the HA. Throbbing. Photophobia. Fioricet was the only med she had been able to take with some relief.   Hot flashes still controlled, still with dyspareunia and she is putting up with it, some better than prev but not resolved.    Meds, vitals, and allergies reviewed.   ROS: Per HPI unless specifically indicated in ROS section   nad Tearful but regains composure.   Neck supple, no LA No tmg rrr ctab abd soft, not ttp  Ext w/o edema Speech and judgement intact

## 2016-02-11 LAB — TSH: TSH: 0.56 u[IU]/mL (ref 0.35–4.50)

## 2016-02-23 ENCOUNTER — Telehealth: Payer: Self-pay | Admitting: Family Medicine

## 2016-02-23 NOTE — Telephone Encounter (Signed)
[facility]  Patient Name: Jessica Barnes  DOB: [birth year]    Initial Comment Caller states has sinuses and can hardly hold her head up. Wants to know if she can get antibiotics.   Nurse Assessment  Nurse: Wynetta Emery, RN, Baker Janus Date/Time Eilene Ghazi Time): 02/23/2016 4:55:24 PM  Confirm and document reason for call. If symptomatic, describe symptoms. You must click the next button to save text entered. ---Jessica Barnes has sinus pain pressure x 2 days and the pain is hard nasal congestion -- can't hardly hold her head up. onset two days ago  Has the patient traveled out of the country within the last 30 days? ---No  Does the patient have any new or worsening symptoms? ---Yes  Will a triage be completed? ---Yes  Related visit to physician within the last 2 weeks? ---No  Does the PT have any chronic conditions? (i.e. diabetes, asthma, etc.) ---No  Is this a behavioral health or substance abuse call? ---No     Guidelines    Guideline Title Affirmed Question Affirmed Notes  Sinus Pain or Congestion [1] Fever returns after gone for over 24 hours AND [2] symptoms worse or not improved    Final Disposition User   See Physician within 24 Hours [place], RN, Baker Janus    Comments  NOTE : 02-24-2016 530pm sinsus pain and pressure Dr. Damita Dunnings   Referrals  REFERRED TO PCP OFFICE   Disagree/Comply: Comply

## 2016-02-24 ENCOUNTER — Ambulatory Visit: Payer: Self-pay | Admitting: Family Medicine

## 2016-02-24 NOTE — Telephone Encounter (Signed)
Pt did schedule OV with you this afternoon---but canceled stating that she "feels better"

## 2016-02-24 NOTE — Telephone Encounter (Signed)
Noted. Thanks.

## 2016-04-04 ENCOUNTER — Other Ambulatory Visit: Payer: Self-pay | Admitting: Family Medicine

## 2016-04-04 NOTE — Telephone Encounter (Signed)
Please call in.  Thanks.   

## 2016-04-04 NOTE — Telephone Encounter (Signed)
Electronic refill request. Last Filled:    90 tablet 1 02/10/2016  Last office visit:   02/10/16   Please advise.

## 2016-04-05 NOTE — Telephone Encounter (Signed)
Medication phoned to pharmacy.  

## 2016-04-19 ENCOUNTER — Other Ambulatory Visit: Payer: Self-pay | Admitting: *Deleted

## 2016-04-19 NOTE — Telephone Encounter (Signed)
Faxed refill request. Last Filled:    30 tablet 1 02/10/2016  Last office visit:   02/10/16  Please advise.

## 2016-04-20 MED ORDER — DIAZEPAM 10 MG PO TABS
10.0000 mg | ORAL_TABLET | Freq: Every evening | ORAL | 1 refills | Status: DC | PRN
Start: 2016-04-20 — End: 2016-06-14

## 2016-04-20 NOTE — Telephone Encounter (Signed)
Diazepam called into [facility].

## 2016-04-20 NOTE — Telephone Encounter (Signed)
Please call in.  Thanks.   

## 2016-04-25 ENCOUNTER — Other Ambulatory Visit: Payer: Self-pay | Admitting: Family Medicine

## 2016-04-25 NOTE — Telephone Encounter (Signed)
Received refill request electronically Last refill 10/28/15 #30/3 No record of CPE/GYN care?  Okay to refill?

## 2016-04-26 ENCOUNTER — Encounter: Payer: Self-pay | Admitting: Family Medicine

## 2016-04-26 NOTE — Telephone Encounter (Signed)
Sent. Thanks.  Needs pap scheduled, if not full cpe.  Thanks.

## 2016-04-26 NOTE — Telephone Encounter (Signed)
Left message on voicemail for patient to call back. 

## 2016-04-29 NOTE — Telephone Encounter (Signed)
Left detailed message on voicemail.  

## 2016-06-01 ENCOUNTER — Other Ambulatory Visit: Payer: Self-pay | Admitting: Family Medicine

## 2016-06-01 NOTE — Telephone Encounter (Signed)
Received refill electronically Last refill 04/04/16 #90/1 Last office visit 02/10/16

## 2016-06-02 NOTE — Telephone Encounter (Signed)
Medication phoned to pharmacy.  

## 2016-06-02 NOTE — Telephone Encounter (Signed)
Please call in.  Thanks.   

## 2016-06-14 ENCOUNTER — Other Ambulatory Visit: Payer: Self-pay | Admitting: *Deleted

## 2016-06-14 NOTE — Telephone Encounter (Signed)
Faxed refill request. Last Filled:    30 tablet 1 04/20/2016  Last office visit:  02/10/16  Please advise.

## 2016-06-15 ENCOUNTER — Other Ambulatory Visit: Payer: Self-pay

## 2016-06-15 MED ORDER — DIAZEPAM 10 MG PO TABS: 10.0000 mg | ORAL_TABLET | Freq: Every evening | ORAL | 1 refills | Status: DC | PRN

## 2016-06-15 NOTE — Telephone Encounter (Signed)
See other request.  Thanks.  Deny this if needed, since covered with the other request.

## 2016-06-15 NOTE — Telephone Encounter (Signed)
Please call in.  Thanks.   

## 2016-06-15 NOTE — Telephone Encounter (Signed)
R called to pharmacy as instructed. 

## 2016-06-15 NOTE — Telephone Encounter (Signed)
Last refill 04/20/16 #30 +1, last OV 02/10/16. Ok to refill?

## 2016-07-14 ENCOUNTER — Ambulatory Visit (INDEPENDENT_AMBULATORY_CARE_PROVIDER_SITE_OTHER): Payer: Self-pay | Admitting: Family Medicine

## 2016-07-14 ENCOUNTER — Encounter: Payer: Self-pay | Admitting: Family Medicine

## 2016-07-14 ENCOUNTER — Other Ambulatory Visit (HOSPITAL_COMMUNITY)
Admission: RE | Admit: 2016-07-14 | Discharge: 2016-07-14 | Disposition: A | Discharge status: Home / Self Care | Payer: Self-pay | Source: Ambulatory Visit | Attending: Family Medicine | Admitting: Family Medicine

## 2016-07-14 VITALS — BP 102/62 | HR 80 | Temp 98.6°F | Ht 63.0 in | Wt 127.5 lb

## 2016-07-14 DIAGNOSIS — Z13220 Encounter for screening for lipoid disorders: Secondary | ICD-10-CM

## 2016-07-14 DIAGNOSIS — Z1151 Encounter for screening for human papillomavirus (HPV): Secondary | ICD-10-CM | POA: Insufficient documentation

## 2016-07-14 DIAGNOSIS — Z01419 Encounter for gynecological examination (general) (routine) without abnormal findings: Secondary | ICD-10-CM | POA: Insufficient documentation

## 2016-07-14 DIAGNOSIS — F32A Depression, unspecified: Secondary | ICD-10-CM

## 2016-07-14 DIAGNOSIS — F329 Major depressive disorder, single episode, unspecified: Secondary | ICD-10-CM

## 2016-07-14 DIAGNOSIS — E039 Hypothyroidism, unspecified: Secondary | ICD-10-CM

## 2016-07-14 DIAGNOSIS — Z131 Encounter for screening for diabetes mellitus: Secondary | ICD-10-CM

## 2016-07-14 DIAGNOSIS — N951 Menopausal and female climacteric states: Secondary | ICD-10-CM

## 2016-07-14 DIAGNOSIS — R51 Headache: Secondary | ICD-10-CM

## 2016-07-14 DIAGNOSIS — J029 Acute pharyngitis, unspecified: Secondary | ICD-10-CM

## 2016-07-14 DIAGNOSIS — R519 Headache, unspecified: Secondary | ICD-10-CM

## 2016-07-14 DIAGNOSIS — Z7189 Other specified counseling: Secondary | ICD-10-CM

## 2016-07-14 DIAGNOSIS — Z23 Encounter for immunization: Secondary | ICD-10-CM

## 2016-07-14 DIAGNOSIS — Z1211 Encounter for screening for malignant neoplasm of colon: Secondary | ICD-10-CM

## 2016-07-14 DIAGNOSIS — Z124 Encounter for screening for malignant neoplasm of cervix: Secondary | ICD-10-CM

## 2016-07-14 DIAGNOSIS — Z0000 Encounter for general adult medical examination without abnormal findings: Secondary | ICD-10-CM

## 2016-07-14 LAB — POCT RAPID STREP A (OFFICE): Rapid Strep A Screen: NEGATIVE

## 2016-07-14 MED ORDER — PROMETHAZINE HCL 25 MG PO TABS: 25.0000 mg | ORAL_TABLET | Freq: Three times a day (TID) | ORAL | 1 refills | Status: DC | PRN

## 2016-07-14 MED ORDER — LEVOTHYROXINE SODIUM 88 MCG PO TABS: ORAL_TABLET | ORAL | 1 refills | Status: DC

## 2016-07-14 MED ORDER — FLUOXETINE HCL 20 MG PO CAPS: 60.0000 mg | ORAL_CAPSULE | Freq: Every day | ORAL | 5 refills | Status: DC

## 2016-07-14 MED ORDER — FLUCONAZOLE 150 MG PO TABS: 150.0000 mg | ORAL_TABLET | Freq: Once | ORAL | 0 refills | Status: AC

## 2016-07-14 NOTE — Patient Instructions (Signed)
Go to the lab on the way out.  We'll contact you with your lab report. Take care.  Glad to see you.  Update me as needed.   

## 2016-07-14 NOTE — Progress Notes (Signed)
CPE- See plan.  Routine anticipatory guidance given to patient.  See health maintenance. Tetanus 2017 Flu d/w pt, encouraged.   PNA and shingles not due.  D/w pt.   DXA not due.   Mammogram encouraged.   D/w patient [id number] for colon cancer screening, including IFOB vs. colonoscopy.  Risks and benefits of both were discussed and patient voiced understanding.  Pt elects for: IFOB.  FH noted but she declined colonoscopy.   Living will d/w pt.  Husband designated if patient were incapacitated.   Diet and exercise d/w pt. Encouraged both.  Active.    ST for the last week.  RST neg.  No fevers known.  No vomiting.  Some cough, mild.   HRT.  She had a period when she was off HRT for a few days.  She is due for pelvic exam.  She had deferred/declined prev due to pelvic pain.  Her pain with intercourse is better now; she had sig pain with intercourse prev.   We have discussed this thoroughly before. She was having such significant menopausal symptoms and pain with intercourse that the benefit of hormone replacement clearly outweighed the risk for her. She is not having any adverse effect on medication other than some scant vaginal bleeding noted recently. This has resolved. It was after intercourse. She had missed a few days of her hormone replacement before this happened. No ongoing bleeding.  HA.  She has seen the HA clinic prev.  She had been taking fiorcet daily.  Tylenol and ibuprofen didn't help.  L sided HA.  Throbbing.  Photophobia.  nausea with the HA occ, with the worst HAs.  Fioricet was the only med she had been able to take with some relief.  no ADE on med.  She can function as is and didn't want to escalate her treatment, other than adding on phenergan.  Hypothyroidism. Complaint.  TSH wnl a few months ago.    Anxiety and depression. Still on prozac with PRN BZD use.  No ADE on med.  No SI/HI.   PMH and SH reviewed  Meds, vitals, and allergies reviewed.   ROS: Per HPI.  Unless  specifically indicated otherwise in HPI, the patient denies:  General: fever. Eyes: acute vision changes ENT: sore throat Cardiovascular: chest pain Respiratory: SOB GI: vomiting GU: dysuria Musculoskeletal: acute back pain Derm: acute rash Neuro: acute motor dysfunction Psych: worsening mood Endocrine: polydipsia Heme: bleeding Allergy: hayfever  GEN: nad, alert and oriented HEENT: mucous membranes moist, oropharynx with minimal cobblestoning but no exudates NECK: supple w/o LA CV: rrr. PULM: ctab, no inc wob ABD: soft, +bs EXT: no edema SKIN: no acute rash Normal introitus for age, no external lesions, no vaginal discharge, mucosa pink and moist, no vaginal or cervical lesions, no vaginal atrophy, no friaility or hemorrhage, normal uterus size and position, no adnexal masses or tenderness.  Chaperoned exam.  She appears to have a stenotic and closed cervix. Discussed with patient. Pap collected

## 2016-07-14 NOTE — Progress Notes (Signed)
Pre visit review using our clinic review tool, if applicable. No additional management support is needed unless otherwise documented below in the visit note. 

## 2016-07-15 ENCOUNTER — Other Ambulatory Visit: Payer: Self-pay | Admitting: Family Medicine

## 2016-07-15 LAB — LIPID PANEL
Cholesterol: 212 mg/dL — ABNORMAL HIGH (ref 0–200)
HDL: 78 mg/dL (ref >39.00)
LDL Cholesterol: 112 mg/dL — ABNORMAL HIGH (ref 0–99)
NonHDL: 133.57
Total CHOL/HDL Ratio: 3
Triglycerides: 110 mg/dL (ref 0.0–149.0)
VLDL: 22 mg/dL (ref 0.0–40.0)

## 2016-07-15 LAB — GLUCOSE, RANDOM: Glucose, Bld: 71 mg/dL (ref 70–99)

## 2016-07-15 NOTE — Telephone Encounter (Signed)
Electronic refill request. Last Filled:    90 tablet 1 06/02/2016  Last office visit:   07/14/2016   Please advise.

## 2016-07-16 NOTE — Telephone Encounter (Signed)
Please call in.  Thanks.   

## 2016-07-17 DIAGNOSIS — Z0000 Encounter for general adult medical examination without abnormal findings: Secondary | ICD-10-CM | POA: Insufficient documentation

## 2016-07-17 DIAGNOSIS — Z7189 Other specified counseling: Secondary | ICD-10-CM | POA: Insufficient documentation

## 2016-07-17 NOTE — Assessment & Plan Note (Signed)
No adverse effect on medication. No suicidal or homicidal intent. Reasonable control. Continue as is.

## 2016-07-17 NOTE — Assessment & Plan Note (Signed)
Previously discussed with patient. She had really difficult and painful intercourse and a lot of hot flashes. Clearly improved on hormone replacement. Risks and benefits discussed with patient and she wanted to continue. It appears that the episode of vaginal bleeding she had was likely related to painful intercourse with a presumed vaginal source for the bleeding. This was after she had missed a few days of hormone replacement. On exam today she has an unremarkable exam except for a stenotic cervical os that likely would preclude any uterine bleeding. I see no evidence of recent uterine bleeding to explain her symptoms. Discussed with patient. At this point I would not think she would need any further evaluation, unless she had recurrent symptoms.

## 2016-07-17 NOTE — Assessment & Plan Note (Signed)
Continue as is. Taking Fioricet. She did not want to escalate her treatment other than adding on Phenergan and this is reasonable. See history above.

## 2016-07-17 NOTE — Assessment & Plan Note (Signed)
Living will d/w pt.  Husband designated if patient were incapacitated.  

## 2016-07-17 NOTE — Assessment & Plan Note (Addendum)
Tetanus 2017 Flu d/w pt, encouraged.   PNA and shingles not due.  D/w pt.   DXA not due.   Mammogram encouraged.   D/w patient [id number] for colon cancer screening, including IFOB vs. colonoscopy.  Risks and benefits of both were discussed and patient voiced understanding.  Pt elects for: IFOB.  FH noted but she declined colonoscopy.   Living will d/w pt.  Husband designated if patient were incapacitated.   Diet and exercise d/w pt. Encouraged both.  Active.   Sore throat discussed with patient. Supportive care. Likely viral, d/w pt.

## 2016-07-17 NOTE — Assessment & Plan Note (Signed)
TSH previously normal. Continue as is.

## 2016-07-18 NOTE — Telephone Encounter (Signed)
Medication phoned to pharmacy.  

## 2016-07-19 LAB — CYTOLOGY - PAP
Adequacy: ABSENT
Diagnosis: NEGATIVE
HPV: NOT DETECTED

## 2016-08-01 ENCOUNTER — Other Ambulatory Visit: Payer: Self-pay | Admitting: Family Medicine

## 2016-08-01 NOTE — Telephone Encounter (Signed)
Electronic refill request. Last Filled:    30 tablet 1 06/15/2016  Please advise.

## 2016-08-02 NOTE — Telephone Encounter (Signed)
Medication phoned to pharmacy.  

## 2016-08-16 ENCOUNTER — Other Ambulatory Visit: Payer: Self-pay | Admitting: Family Medicine

## 2016-08-16 NOTE — Telephone Encounter (Signed)
Electronic refill request. Last Filled:    90 capsule 3 08/25/2015  Please advise.

## 2016-08-17 ENCOUNTER — Other Ambulatory Visit: Payer: Self-pay | Admitting: Family Medicine

## 2016-08-17 NOTE — Telephone Encounter (Signed)
Last refill 07/14/16 #30/1 Last office visit same date

## 2016-08-17 NOTE — Telephone Encounter (Signed)
Sent. Thanks.   

## 2016-08-18 NOTE — Telephone Encounter (Signed)
Sent. Thanks.   

## 2016-08-23 ENCOUNTER — Other Ambulatory Visit: Payer: Self-pay | Admitting: Family Medicine

## 2016-08-23 NOTE — Telephone Encounter (Signed)
Electronic refill request. Last Filled:    90 tablet 1 07/16/2016  Please advise.

## 2016-08-24 NOTE — Telephone Encounter (Signed)
Rx called to pharmacy as instructed. 

## 2016-08-24 NOTE — Telephone Encounter (Signed)
Please call in.  Thanks.   

## 2016-09-19 ENCOUNTER — Other Ambulatory Visit: Payer: Self-pay | Admitting: Family Medicine

## 2016-09-19 DIAGNOSIS — G43909 Migraine, unspecified, not intractable, without status migrainosus: Secondary | ICD-10-CM

## 2016-09-19 NOTE — Telephone Encounter (Signed)
Last office visit 07/14/2016.  Last refilled Phenergan: 08/18/2016 for #30 with 1 refill. Diazepam: 08/02/2016 for #60 with 1 refill.  Ok to refill?

## 2016-09-20 NOTE — Telephone Encounter (Signed)
Why is she needing 60 phenergan in a month?  Please clarify with patient.  Refill also early on the diazepam.

## 2016-09-20 NOTE — Telephone Encounter (Signed)
LMOM for pt to CB to discuss refill requests.

## 2016-09-21 NOTE — Telephone Encounter (Signed)
Spoke to patient and was advised that she just ordered the Diazepam while ordering other medication. Advised patient that the request is too early. Patient stated that she got her last refill on the Phenergan 2 days ago and has taken about 2. Patient stated that she does not take the Phenergan every day, but when she has a migraine she will take 1 or 2 at a time.

## 2016-09-21 NOTE — Telephone Encounter (Signed)
Pt returned call about refills. Please call cell at [phone]

## 2016-09-22 ENCOUNTER — Other Ambulatory Visit: Payer: Self-pay | Admitting: Family Medicine

## 2016-09-22 ENCOUNTER — Telehealth: Payer: Self-pay | Admitting: Family Medicine

## 2016-09-22 NOTE — Telephone Encounter (Signed)
Pt notified of Dr. Josefine Class recommendations and instructions. Pt said she isn't going back to the neurologist, pt said that she has been dealing with this since she was 67 and she has been back and fourth with neurologist and she isn't going back. Pt said she understands that Dr. Damita Dunnings can't fill meds early but she isn't seeing another neurologist

## 2016-09-22 NOTE — Telephone Encounter (Signed)
Called Patient to schedule Neurology consult .  Patient declined to go back to see neurologist does not wish [place] to schedule a Neurology appt at this time,

## 2016-09-22 NOTE — Telephone Encounter (Signed)
In that case, it's still too early on both.  Given that her headaches are not getting any better, and seem to be getting worse, then we need to get her back over to neurology for input.   I put in the referral.  She needs their advice.  Thanks.

## 2016-09-22 NOTE — Telephone Encounter (Signed)
Noted  

## 2016-09-22 NOTE — Telephone Encounter (Signed)
I need patient to decide what her course of action will be to improve her situation, since her HA frequency/severity is not improved as is.  I still suggest neuro input.  Thanks.

## 2016-09-22 NOTE — Telephone Encounter (Signed)
Left voicemail requesting pt to call the office back 

## 2016-10-14 ENCOUNTER — Other Ambulatory Visit: Payer: Self-pay | Admitting: Family Medicine

## 2016-10-17 NOTE — Telephone Encounter (Signed)
I need to know what her plan is for long-term headache management.  This is clearly not working if she is having recurrent daily headaches. I again suggest neuro referral/eval.

## 2016-10-17 NOTE — Telephone Encounter (Signed)
Last office visit 07/14/2016.  Last refilled 08/24/2016 for #90 with 1 refill.  Ok to refill?

## 2016-10-18 NOTE — Telephone Encounter (Signed)
Spoke to pt who states she has seen 4-5 neurologist over the last 36yrs shes had migraines. She states this is the only medication she can take without fatigue or drowsiness

## 2016-10-19 NOTE — Telephone Encounter (Signed)
Rx called to pharmacy as instructed. Patient notified by telephone.

## 2016-10-19 NOTE — Telephone Encounter (Signed)
If we have offered mult times, and if she declines referral, then at least we have tried. Seeing no absolute contraindication to med, then okay to continue.  Please call in. Thanks.

## 2016-10-31 ENCOUNTER — Other Ambulatory Visit: Payer: Self-pay | Admitting: Family Medicine

## 2016-10-31 NOTE — Telephone Encounter (Signed)
Received refill electronically Last refill 08/02/16 #60/1 Last office visit 07/14/16

## 2016-11-01 NOTE — Telephone Encounter (Signed)
Please call in.  Thanks.   

## 2016-11-01 NOTE — Telephone Encounter (Signed)
Rx called to pharmacy as instructed. 

## 2016-12-20 ENCOUNTER — Other Ambulatory Visit: Payer: Self-pay | Admitting: Family Medicine

## 2016-12-20 NOTE — Telephone Encounter (Signed)
Electronic refill request. Last office visit:   11/60/17 CPE Last Filled:    90 tablet 1 10/19/2016  Please advise.

## 2016-12-21 NOTE — Telephone Encounter (Signed)
Rx called to pharmacy as instructed. 

## 2016-12-21 NOTE — Telephone Encounter (Signed)
Please call in.  Thanks.   

## 2017-01-03 ENCOUNTER — Other Ambulatory Visit: Payer: Self-pay | Admitting: Family Medicine

## 2017-01-03 NOTE — Telephone Encounter (Signed)
Electronic refill request. Last office visit:   07/14/2016 Last Filled:     60 tablet 1 11/01/2016  Please advise.

## 2017-01-04 NOTE — Telephone Encounter (Signed)
Attempted to call in Rx; pharmacy line busy

## 2017-01-04 NOTE — Telephone Encounter (Signed)
Rx called to pharmacy as instructed. 

## 2017-01-04 NOTE — Telephone Encounter (Signed)
Please call in.  Thanks.   

## 2017-01-24 ENCOUNTER — Other Ambulatory Visit: Payer: Self-pay | Admitting: Family Medicine

## 2017-01-24 NOTE — Telephone Encounter (Signed)
Electronic refill request. Last office visit:   07/14/16 CPE Last Filled:    90 tablet 1 12/21/2016  Please advise.

## 2017-01-26 NOTE — Telephone Encounter (Signed)
Please call in.  Thanks.   

## 2017-01-26 NOTE — Telephone Encounter (Signed)
Medication phoned to pharmacy.  

## 2017-02-23 ENCOUNTER — Other Ambulatory Visit: Payer: Self-pay | Admitting: Family Medicine

## 2017-02-23 NOTE — Telephone Encounter (Signed)
Electronic refill request. Fioricet Last office visit:   07/14/16 Last Filled:    90 tablet 1 01/26/2017  Please advise.

## 2017-02-24 NOTE — Telephone Encounter (Signed)
She should have a refill.  If she has taken #180 in the last month, then she needs OV to discuss options since this clearly isn't working well for her.

## 2017-02-28 ENCOUNTER — Other Ambulatory Visit: Payer: Self-pay | Admitting: Family Medicine

## 2017-03-29 ENCOUNTER — Other Ambulatory Visit: Payer: Self-pay | Admitting: Family Medicine

## 2017-03-29 NOTE — Telephone Encounter (Signed)
Rx called to pharmacy as instructed. 

## 2017-03-29 NOTE — Telephone Encounter (Signed)
Please call in.  Thanks.   

## 2017-03-29 NOTE — Telephone Encounter (Signed)
Received refill electronically Last refill  01/26/17 #90/1 Last office visit 07/14/16

## 2017-04-18 ENCOUNTER — Other Ambulatory Visit: Payer: Self-pay | Admitting: Family Medicine

## 2017-04-25 ENCOUNTER — Other Ambulatory Visit: Payer: Self-pay | Admitting: Family Medicine

## 2017-05-29 ENCOUNTER — Other Ambulatory Visit: Payer: Self-pay | Admitting: Family Medicine

## 2017-05-29 NOTE — Telephone Encounter (Signed)
Electronic refill request. Fioricet Last office visit:   07/14/2016 Last Filled:    90 tablet 1 03/29/2017  Please advise.

## 2017-05-30 ENCOUNTER — Other Ambulatory Visit: Payer: Self-pay | Admitting: Family Medicine

## 2017-05-30 ENCOUNTER — Encounter: Payer: Self-pay | Admitting: *Deleted

## 2017-05-30 NOTE — Telephone Encounter (Signed)
Please call in.  Due for cpe when possible.  Thanks.

## 2017-05-30 NOTE — Telephone Encounter (Signed)
Medication phoned to pharmacy. Letter mailed.

## 2017-06-09 ENCOUNTER — Encounter: Payer: Self-pay | Admitting: Family Medicine

## 2017-06-12 ENCOUNTER — Other Ambulatory Visit: Payer: Self-pay | Admitting: Family Medicine

## 2017-06-14 ENCOUNTER — Other Ambulatory Visit: Payer: Self-pay | Admitting: Family Medicine

## 2017-07-10 ENCOUNTER — Other Ambulatory Visit: Payer: Self-pay | Admitting: Family Medicine

## 2017-07-18 ENCOUNTER — Encounter: Payer: Self-pay | Admitting: Family Medicine

## 2017-07-27 ENCOUNTER — Other Ambulatory Visit: Payer: Self-pay | Admitting: Family Medicine

## 2017-08-01 ENCOUNTER — Other Ambulatory Visit: Payer: Self-pay | Admitting: Family Medicine

## 2017-08-01 NOTE — Telephone Encounter (Signed)
Electronic refill request. Butalbital-acetaminophen-caffeine Last office visit:   07/14/16   Appt for CPE scheduled in January 2019 Last Filled:    90 tablet 1 05/30/2017  Please advise.

## 2017-08-02 NOTE — Telephone Encounter (Signed)
Please call in.  Thanks.   

## 2017-08-02 NOTE — Telephone Encounter (Signed)
Rx called to pharmacy as instructed. 

## 2017-08-21 ENCOUNTER — Other Ambulatory Visit: Payer: Self-pay | Admitting: Family Medicine

## 2017-08-21 NOTE — Telephone Encounter (Signed)
Electronic refill request. Diazepam Last office visit:   07/14/2016 Last Filled:    60 tablet 1 01/04/2017  Please advise.

## 2017-08-22 NOTE — Telephone Encounter (Signed)
Has OV pending.  Please call in.  Thanks.

## 2017-08-22 NOTE — Telephone Encounter (Signed)
Medication phoned to pharmacy.  

## 2017-09-05 ENCOUNTER — Encounter: Payer: Self-pay | Admitting: Family Medicine

## 2017-09-05 ENCOUNTER — Telehealth: Payer: Self-pay | Admitting: *Deleted

## 2017-09-05 NOTE — Telephone Encounter (Signed)
Copied from [place] [id number]. Topic: Quick Communication - Appointment Cancellation >> Sep 05, 2017  1:49 PM Ether Griffins B wrote: Patient called to cancel appointment scheduled for 09/05/17. Patient has rescheduled their appointment.    Route to department's PEC pool.  Should patient be charged a late cancellation fee?

## 2017-09-06 NOTE — Telephone Encounter (Signed)
Don't charge her.  Thanks.

## 2017-09-12 ENCOUNTER — Encounter: Payer: Self-pay | Admitting: Family Medicine

## 2017-09-21 ENCOUNTER — Other Ambulatory Visit: Payer: Self-pay | Admitting: Family Medicine

## 2017-10-03 ENCOUNTER — Other Ambulatory Visit: Payer: Self-pay | Admitting: Family Medicine

## 2017-10-03 NOTE — Telephone Encounter (Addendum)
Electronic refill request. Butalbital-acetaminophen-caffeine Last office visit:   07/14/2016 CPE scheduled 10/06/17  Last Filled:    90 tablet 1 08/02/2017  Please advise.

## 2017-10-04 NOTE — Telephone Encounter (Signed)
rx sent.  Thanks.  Has f/u scheduled.

## 2017-10-06 ENCOUNTER — Encounter: Payer: Self-pay | Admitting: *Deleted

## 2017-10-06 ENCOUNTER — Encounter: Payer: Self-pay | Admitting: Family Medicine

## 2017-10-09 ENCOUNTER — Other Ambulatory Visit: Payer: Self-pay | Admitting: Family Medicine

## 2017-10-25 ENCOUNTER — Other Ambulatory Visit: Payer: Self-pay | Admitting: Family Medicine

## 2017-10-25 NOTE — Telephone Encounter (Signed)
Patient wants to know if Levothyroxine Sodium 88 MCG can be refilled until her appt. She said she is not going to cancel this appt as she did the others due to sickness and taking care of her elderly dad

## 2017-10-27 NOTE — Telephone Encounter (Signed)
Sent but I will not refill any other meds until she keeps an appointment.

## 2017-11-02 ENCOUNTER — Other Ambulatory Visit: Payer: Self-pay | Admitting: Family Medicine

## 2017-11-02 NOTE — Telephone Encounter (Signed)
Electronic refill request. Fioricet Last office visit:   07/14/16   Appt scheduled:  11/20/17 for CPE Last Filled:    90 tablet 0 10/04/2017  Please advise.

## 2017-11-05 NOTE — Telephone Encounter (Signed)
Sent. Thanks.   

## 2017-11-09 ENCOUNTER — Other Ambulatory Visit: Payer: Self-pay | Admitting: Family Medicine

## 2017-11-13 ENCOUNTER — Other Ambulatory Visit: Payer: Self-pay | Admitting: Family Medicine

## 2017-11-13 NOTE — Telephone Encounter (Signed)
Electronic refill request Last office visit 07/14/16 Upcoming appointment scheduled 11/20/17 Numerous appointments cancelled

## 2017-11-14 NOTE — Telephone Encounter (Signed)
Has to keep OV. Sent enough to get to OV. Thanks.

## 2017-11-20 ENCOUNTER — Encounter: Payer: Self-pay | Admitting: Family Medicine

## 2017-11-20 ENCOUNTER — Ambulatory Visit (INDEPENDENT_AMBULATORY_CARE_PROVIDER_SITE_OTHER): Payer: BLUE CROSS/BLUE SHIELD | Admitting: Family Medicine

## 2017-11-20 VITALS — BP 112/76 | HR 90 | Temp 98.5°F | Ht 63.0 in | Wt 148.8 lb

## 2017-11-20 DIAGNOSIS — Z0000 Encounter for general adult medical examination without abnormal findings: Secondary | ICD-10-CM | POA: Diagnosis not present

## 2017-11-20 DIAGNOSIS — N941 Unspecified dyspareunia: Secondary | ICD-10-CM

## 2017-11-20 DIAGNOSIS — E785 Hyperlipidemia, unspecified: Secondary | ICD-10-CM

## 2017-11-20 DIAGNOSIS — E039 Hypothyroidism, unspecified: Secondary | ICD-10-CM

## 2017-11-20 DIAGNOSIS — G43909 Migraine, unspecified, not intractable, without status migrainosus: Secondary | ICD-10-CM

## 2017-11-20 DIAGNOSIS — F329 Major depressive disorder, single episode, unspecified: Secondary | ICD-10-CM

## 2017-11-20 DIAGNOSIS — Z7189 Other specified counseling: Secondary | ICD-10-CM

## 2017-11-20 DIAGNOSIS — F32A Depression, unspecified: Secondary | ICD-10-CM

## 2017-11-20 DIAGNOSIS — Z1211 Encounter for screening for malignant neoplasm of colon: Secondary | ICD-10-CM

## 2017-11-20 MED ORDER — FLUOXETINE HCL 20 MG PO CAPS: 60.0000 mg | ORAL_CAPSULE | Freq: Every day | ORAL | 3 refills | Status: DC

## 2017-11-20 MED ORDER — BUTALBITAL-APAP-CAFFEINE 50-325-40 MG PO TABS
ORAL_TABLET | ORAL | 3 refills | Status: DC
Start: 2017-11-20 — End: 2018-03-22

## 2017-11-20 MED ORDER — LEVOTHYROXINE SODIUM 88 MCG PO TABS: ORAL_TABLET | ORAL | 3 refills | Status: DC

## 2017-11-20 MED ORDER — ESTROGENS CONJUGATED 0.9 MG PO TABS: 0.9000 mg | ORAL_TABLET | Freq: Every day | ORAL | 3 refills | Status: DC

## 2017-11-20 MED ORDER — DIAZEPAM 10 MG PO TABS: 10.0000 mg | ORAL_TABLET | Freq: Every evening | ORAL | 1 refills | Status: DC | PRN

## 2017-11-20 MED ORDER — PROGESTERONE MICRONIZED 200 MG PO CAPS: 200.0000 mg | ORAL_CAPSULE | Freq: Every day | ORAL | 3 refills | Status: DC

## 2017-11-20 MED ORDER — PROMETHAZINE HCL 25 MG PO TABS: ORAL_TABLET | ORAL | 3 refills | Status: DC

## 2017-11-20 NOTE — Progress Notes (Signed)
CPE- See plan.  Routine anticipatory guidance given to patient.  See health maintenance.  The possibility exists that previously documented standard health maintenance information may have been brought forward from a previous encounter into this note.  If needed, that same information has been updated to reflect the current situation based on today's encounter.    Tetanus 2017 Flu 2018 PNA and shingles not due.  D/w pt.   DXA not due Mammogram encouraged, d/w pt.  See AVS.   Pap 2017 D/w patient HR:[id number] for colon cancer screening, including IFOB vs. colonoscopy.  Risks and benefits of both were discussed and patient voiced understanding.  Pt elects for: IFOB.  FH noted but she declined colonoscopy.   Living will d/w pt.  Husband designated if patient were incapacitated.   Diet and exercise d/w pt. Encouraged both.  Active.   D/w pt about HCV screening.  She'll consider.   D/w pt about HIV screening.  She'll consider.    Her father in law died.   Also her father was inpatient, then in rehab, now she is caring for him at home with 24/7 care.  We talked about her situation.  Per patient report, pt's mother is not in contact now, after dispute re: care of pt's father.  This has been a strain on the patient, d/w pt.  "he raised me.  I never thought I'd be changing my father's diapers."  Per patient, she is safe at home now.  Per patient, there is a restraining order against the patient's mother.  She was apologetic about missing appointment prev.  I told her I was glad to see her.    Migraines.   Still with migraines.  Still taking fioricet at baseline with some relief.  "I can function with that med."  Fioricet was the only med she had been able to take with some relief. No ADE on med.    Hypothyroidism.  No neck mass, no lumps.  No dysphagia.  Due for labs.    HRT.   Her pain with intercourse is better now; she had sig pain with intercourse prev.   We have discussed this thoroughly before. She  was having such significant menopausal symptoms and pain with intercourse that the benefit of hormone replacement clearly outweighed the risk for her. We talked about it again, she still has hot flashes but she wanted to continue with meds as is based on risk/benefit.  Pain with intercourse is better.  No bleeding.    Depression.  See above.  Still on prozac at baseline.  Using BZD prn.  No SI/HI.    PMH and SH reviewed  Meds, vitals, and allergies reviewed.   ROS: Per HPI.  Unless specifically indicated otherwise in HPI, the patient denies:  General: fever. Eyes: acute vision changes ENT: sore throat Cardiovascular: chest pain Respiratory: SOB GI: vomiting GU: dysuria Musculoskeletal: acute back pain Derm: acute rash Neuro: acute motor dysfunction Psych: worsening mood Endocrine: polydipsia Heme: bleeding Allergy: hayfever  GEN: nad, alert and oriented HEENT: mucous membranes moist NECK: supple w/o LA CV: rrr. PULM: ctab, no inc wob ABD: soft, +bs EXT: no edema SKIN: no acute rash

## 2017-11-20 NOTE — Patient Instructions (Addendum)
You can call for a mammogram at [facility] at [facility].  [place] to the lab on the way out.  We'll contact you with your lab report. Take care.  Glad to see you.  Thanks for your effort.

## 2017-11-21 LAB — COMPREHENSIVE METABOLIC PANEL
ALT: 14 U/L (ref 0–35)
AST: 18 U/L (ref 0–37)
Albumin: 4.1 g/dL (ref 3.5–5.2)
Alkaline Phosphatase: 99 U/L (ref 39–117)
BUN: 13 mg/dL (ref 6–23)
CO2: 24 mEq/L (ref 19–32)
Calcium: 9.1 mg/dL (ref 8.4–10.5)
Chloride: 102 mEq/L (ref 96–112)
Creatinine, Ser: 0.71 mg/dL (ref 0.40–1.20)
GFR: 90.02 mL/min (ref >60.00)
Glucose, Bld: 84 mg/dL (ref 70–99)
Potassium: 4.4 mEq/L (ref 3.5–5.1)
Sodium: 135 mEq/L (ref 135–145)
Total Bilirubin: 0.3 mg/dL (ref 0.2–1.2)
Total Protein: 7.7 g/dL (ref 6.0–8.3)

## 2017-11-21 LAB — LIPID PANEL
Cholesterol: 254 mg/dL — ABNORMAL HIGH (ref 0–200)
HDL: 68.6 mg/dL (ref >39.00)
NonHDL: 185.6
Total CHOL/HDL Ratio: 4
Triglycerides: 249 mg/dL — ABNORMAL HIGH (ref 0.0–149.0)
VLDL: 49.8 mg/dL — ABNORMAL HIGH (ref 0.0–40.0)

## 2017-11-21 LAB — LDL CHOLESTEROL, DIRECT: Direct LDL: 158 mg/dL

## 2017-11-21 LAB — TSH: TSH: 8.29 u[IU]/mL — ABNORMAL HIGH (ref 0.35–4.50)

## 2017-11-23 DIAGNOSIS — G43909 Migraine, unspecified, not intractable, without status migrainosus: Secondary | ICD-10-CM | POA: Insufficient documentation

## 2017-11-23 NOTE — Assessment & Plan Note (Signed)
Still with migraines.  Still taking fioricet at baseline with some relief.  "I can function with that med."  Fioricet was the only med she had been able to take with some relief. No ADE on med.

## 2017-11-23 NOTE — Assessment & Plan Note (Signed)
Her pain with intercourse is better now; she had sig pain with intercourse prev.   We have discussed this thoroughly before. She was having such significant menopausal symptoms and pain with intercourse that the benefit of hormone replacement clearly outweighed the risk for her. We talked about it again, she still has hot flashes but she wanted to continue with meds as is based on risk/benefit.  Pain with intercourse is better.  No bleeding.

## 2017-11-23 NOTE — Assessment & Plan Note (Signed)
Living will d/w pt.  Husband designated if patient were incapacitated.  

## 2017-11-23 NOTE — Assessment & Plan Note (Signed)
Tetanus 2017 Flu 2018 PNA and shingles not due.  D/w pt.   DXA not due Mammogram encouraged, d/w pt.  See AVS.   Pap 2017 D/w patient [id number] for colon cancer screening, including IFOB vs. colonoscopy.  Risks and benefits of both were discussed and patient voiced understanding.  Pt elects for: IFOB.  FH noted but she declined colonoscopy.   Living will d/w pt.  Husband designated if patient were incapacitated.   Diet and exercise d/w pt. Encouraged both.  Active.   D/w pt about HCV screening.  She'll consider.   D/w pt about HIV screening.  She'll consider.

## 2017-11-23 NOTE — Assessment & Plan Note (Signed)
No tmg on exam. See notes on labs.

## 2017-11-23 NOTE — Assessment & Plan Note (Signed)
See above.  Still on prozac at baseline.  Using BZD prn.  No SI/HI.   Continue as is.

## 2017-11-26 ENCOUNTER — Other Ambulatory Visit: Payer: Self-pay | Admitting: Family Medicine

## 2017-11-26 DIAGNOSIS — E039 Hypothyroidism, unspecified: Secondary | ICD-10-CM

## 2017-11-26 MED ORDER — LEVOTHYROXINE SODIUM 88 MCG PO TABS
ORAL_TABLET | ORAL | 3 refills | Status: DC
Start: 2017-11-26 — End: 2019-01-01

## 2017-11-27 ENCOUNTER — Encounter: Payer: Self-pay | Admitting: *Deleted

## 2018-01-30 ENCOUNTER — Other Ambulatory Visit: Payer: BLUE CROSS/BLUE SHIELD

## 2018-03-01 ENCOUNTER — Other Ambulatory Visit: Payer: Self-pay | Admitting: Family Medicine

## 2018-03-01 NOTE — Telephone Encounter (Signed)
Name of Medication: Diazepam Name of Pharmacy: [facility] or Written Date and Quantity:  30 tablet 1 11/20/2017  Last Office Visit and Type: 11/20/17 CPE Next Office Visit and Type: None Last Controlled Substance Agreement Date: None Last UDS: None

## 2018-03-02 NOTE — Telephone Encounter (Signed)
Sent. Thanks.   

## 2018-03-22 ENCOUNTER — Other Ambulatory Visit: Payer: Self-pay | Admitting: Family Medicine

## 2018-03-22 NOTE — Telephone Encounter (Signed)
Name of Medication: Fioricet Name of Pharmacy: [facility] or Written Date and Quantity:  90 tablet 3 11/20/2017  Last Office Visit and Type: 11/20/17 CPE Next Office Visit and Type: None Last Controlled Substance Agreement Date: None Last UDS: None

## 2018-03-23 NOTE — Telephone Encounter (Signed)
Sent. Thanks.   

## 2018-03-27 ENCOUNTER — Ambulatory Visit: Payer: Self-pay | Admitting: Internal Medicine

## 2018-03-28 ENCOUNTER — Encounter: Payer: Self-pay | Admitting: Internal Medicine

## 2018-03-28 ENCOUNTER — Ambulatory Visit (INDEPENDENT_AMBULATORY_CARE_PROVIDER_SITE_OTHER): Payer: BLUE CROSS/BLUE SHIELD | Admitting: Internal Medicine

## 2018-03-28 VITALS — BP 110/76 | HR 82 | Temp 98.2°F | Wt 151.0 lb

## 2018-03-28 DIAGNOSIS — R3989 Other symptoms and signs involving the genitourinary system: Secondary | ICD-10-CM | POA: Diagnosis not present

## 2018-03-28 DIAGNOSIS — R300 Dysuria: Secondary | ICD-10-CM

## 2018-03-28 DIAGNOSIS — R310 Gross hematuria: Secondary | ICD-10-CM

## 2018-03-28 LAB — POC URINALSYSI DIPSTICK (AUTOMATED)
Blood, UA: NEGATIVE
Glucose, UA: NEGATIVE
Ketones, UA: NEGATIVE
Nitrite, UA: POSITIVE
Protein, UA: POSITIVE — AB
Spec Grav, UA: >=1.03 — AB (ref 1.010–1.025)
Urobilinogen, UA: 2 E.U./dL — AB
pH, UA: 5.5 (ref 5.0–8.0)

## 2018-03-28 MED ORDER — CEPHALEXIN 500 MG PO CAPS: 500.0000 mg | ORAL_CAPSULE | Freq: Two times a day (BID) | ORAL | 0 refills | Status: DC

## 2018-03-28 NOTE — Patient Instructions (Signed)

## 2018-03-28 NOTE — Progress Notes (Signed)
HPI  Pt presents to the clinic today with c/o dysuria and bladder pressure. She reports this stated yesterday. She noticed a small amount of blood in her urine yesterday but none today. She denies urgency, frequency or burning. She denies vaginal complaints. She has tried AZO with some relief. She has had UTI's in the past and reports this feels the same.   Review of Systems  Past Medical History:  Diagnosis Date  . Anxiety 10/13/96  . Chest pain    atypical, hosp for that and depression 8/10-8/11/08  . COPD (chronic obstructive pulmonary disease) ([place]) 09/15/05   by x-ray  . Depression 10/13/96   hospital 8/10-8/11/08.  notable history: son with TBI after MVA  . Hyperlipidemia 09/15/98  . Hypothyroidism 01/14/96  . Migraine    27 yoa  . NSVD (normal spontaneous vaginal delivery)    x 1  . PTSD (post-traumatic stress disorder)    likely PTSD after son's MVA/TBI in 2007, s/p counseling    Family History  Problem Relation Age of Onset  . Hypertension Mother   . Colon cancer Father   . Thyroid disease Sister   . Colon cancer Paternal Aunt   . Breast cancer Neg Hx     Social History   Socioeconomic History  . Marital status: Married    Spouse name: Not on file  . Number of children: Not on file  . Years of education: Not on file  . Highest education level: Not on file  Occupational History  . Not on file  Social Needs  . Financial resource strain: Not on file  . Food insecurity:    Worry: Not on file    Inability: Not on file  . Transportation needs:    Medical: Not on file    Non-medical: Not on file  Tobacco Use  . Smoking status: Former Smoker    Packs/day: 0.50    Years: 8.00    Pack years: 4.00    Types: Cigarettes  . Smokeless tobacco: Never Used  . Tobacco comment: quit 04/15/2014  Substance and Sexual Activity  . Alcohol use: No    Alcohol/week: 0.0 standard drinks  . Drug use: No  . Sexual activity: Not on file  Lifestyle  . Physical activity:    Days per  week: Not on file    Minutes per session: Not on file  . Stress: Not on file  Relationships  . Social connections:    Talks on phone: Not on file    Gets together: Not on file    Attends religious service: Not on file    Active member of club or organization: Not on file    Attends meetings of clubs or organizations: Not on file    Relationship status: Not on file  . Intimate partner violence:    Fear of current or ex partner: Not on file    Emotionally abused: Not on file    Physically abused: Not on file    Forced sexual activity: Not on file  Other Topics Concern  . Not on file  Social History Narrative   Remarried, lives with husband   1 son, he had TBI and short term memory loss after prolonged illness    Allergies  Allergen Reactions  . Sulfamethoxazole-Trimethoprim     Lip and tongue edema  . Codeine     GI upset     Constitutional: Denies fever, malaise, fatigue, headache or abrupt weight changes.   GU: Pt reports blood  in urine, bladder pressure and pain with urination. Denies urgency, frequency, burning sensation,  odor or discharge.   No other specific complaints in a complete review of systems (except as listed in HPI above).    Objective:   Physical Exam  BP 110/76   Pulse 82   Temp 98.2 F (36.8 C) (Oral)   Wt 151 lb (68.5 kg)   LMP 11/30/2011   SpO2 100%   BMI 26.75 kg/m  Wt Readings from Last 3 Encounters:  03/28/18 151 lb (68.5 kg)  11/20/17 148 lb 12 oz (67.5 kg)  07/14/16 127 lb 8 oz (57.8 kg)    General: Appears her stated age, well developed, well nourished in NAD. Abdomen: Soft. Normal bowel sounds. No distention or masses noted.  Tender to palpation over the bladder area. No CVA tenderness.        Assessment & Plan:  Bladder Pressure, Dysuria, Blood in Urine secondary to {resumed UTI:  Urinalysis: trace leuks, AZO interferrence Will send urine culture eRx sent if for Keflex 500 mg BID x 5 days OK to take AZO OTC Drink plenty  of fluids  RTC as needed or if symptoms persist. Webb Silversmith, NP

## 2018-03-30 LAB — URINE CULTURE
MICRO NUMBER:: 90965765
SPECIMEN QUALITY:: ADEQUATE

## 2018-05-09 ENCOUNTER — Other Ambulatory Visit: Payer: Self-pay | Admitting: Family Medicine

## 2018-05-09 NOTE — Telephone Encounter (Signed)
Electronic refill request Last office visit 03/28/18/acute Last refill 03/02/18 #30/1

## 2018-05-10 NOTE — Telephone Encounter (Signed)
Sent. Thanks.   

## 2018-06-06 ENCOUNTER — Other Ambulatory Visit: Payer: Self-pay | Admitting: Family Medicine

## 2018-06-06 NOTE — Telephone Encounter (Signed)
Electronic refill request Diazepam Last office visit 03/28/18 acute Last refill 05/10/18 #30/1

## 2018-06-07 NOTE — Telephone Encounter (Signed)
She should still have a refill on this.  Please check with pharmacy.  Thanks.

## 2018-06-07 NOTE — Telephone Encounter (Signed)
Patient picked up Rx yesterday which was the original refill and they are requesting refills for next month.

## 2018-06-08 NOTE — Telephone Encounter (Signed)
Sent with fill on/after date.  Thanks.

## 2018-07-21 ENCOUNTER — Other Ambulatory Visit: Payer: Self-pay | Admitting: Family Medicine

## 2018-07-23 NOTE — Telephone Encounter (Signed)
Electronic refill request Fioricet Last refill 03/23/18 #90/3 Last office visit 03/28/18 acute No upcoming appointment scheduled

## 2018-07-24 ENCOUNTER — Other Ambulatory Visit: Payer: Self-pay | Admitting: *Deleted

## 2018-07-25 NOTE — Telephone Encounter (Signed)
Sent. Thanks.   

## 2018-09-11 ENCOUNTER — Other Ambulatory Visit: Payer: Self-pay | Admitting: Family Medicine

## 2018-09-11 NOTE — Telephone Encounter (Signed)
Electronic refill request. Diazepam Last office visit:   03/28/18 Acute Last Filled:    30 tablet 1 06/08/2018  Please advise.

## 2018-09-12 NOTE — Telephone Encounter (Signed)
Sent. Thanks.   

## 2018-11-05 ENCOUNTER — Other Ambulatory Visit: Payer: Self-pay | Admitting: Family Medicine

## 2018-11-05 NOTE — Telephone Encounter (Signed)
Electronic refill request. Diazepam Last office visit:   03/28/18 Acute Last Filled:    30 tablet 1 09/12/2018  Please advise.

## 2018-11-06 NOTE — Telephone Encounter (Signed)
Sent. Thanks.   

## 2018-11-20 ENCOUNTER — Other Ambulatory Visit: Payer: Self-pay | Admitting: Family Medicine

## 2018-11-21 NOTE — Telephone Encounter (Signed)
Electronic refill request Fioricet Last office visit 03/28/18 acute Last refill 07/25/18 #90/3

## 2018-11-21 NOTE — Telephone Encounter (Signed)
Sent. Thanks.   

## 2018-12-02 ENCOUNTER — Other Ambulatory Visit: Payer: Self-pay | Admitting: Family Medicine

## 2018-12-02 DIAGNOSIS — E039 Hypothyroidism, unspecified: Secondary | ICD-10-CM

## 2018-12-02 DIAGNOSIS — E785 Hyperlipidemia, unspecified: Secondary | ICD-10-CM

## 2018-12-03 ENCOUNTER — Telehealth: Payer: Self-pay | Admitting: Family Medicine

## 2018-12-03 NOTE — Telephone Encounter (Signed)
°  Called left voicemail for pt to call us back and schedule virtual visit with labs prior.           Jessica Barnes Female, 58 y.o., December 05, 1960 MRN:  [id number] Phone:  [phone] (H) ... PCP:  Tonia Ghent, MD Coverage:  Sherre Poot [employer] OTHER Message  Received: Yesterday  Message Contents  Tonia Ghent, MD  Genella Rife H        Needs web visit re: hypothyroidism, HLD, etc, with labs ahead of time. Labs ordered.   Needs labs visit.  Thanks.

## 2018-12-10 ENCOUNTER — Other Ambulatory Visit: Payer: Self-pay | Admitting: Family Medicine

## 2018-12-10 NOTE — Telephone Encounter (Signed)
Pharmacy requests refill on: [place]  LAST REFILL: #90, 3 refills on 11/20/17 LAST OV: 03/2018 acute/ 11/20/17 for annual exam NEXT OV: Not scheduled PHARMACY: [facility]

## 2018-12-11 NOTE — Telephone Encounter (Signed)
Sent.  Needs follow-up scheduled regarding HRT.  Thanks.

## 2018-12-11 NOTE — Telephone Encounter (Signed)
LVM pt needs virtual/phone follow up

## 2018-12-17 ENCOUNTER — Other Ambulatory Visit: Payer: Self-pay | Admitting: Family Medicine

## 2018-12-18 NOTE — Telephone Encounter (Signed)
Last CPE 11-20-17 No Future OV

## 2018-12-19 NOTE — Telephone Encounter (Signed)
Needs f/u scheduled via doxy re: current meds. Thanks.  rx sent.

## 2018-12-20 ENCOUNTER — Other Ambulatory Visit: Payer: Self-pay | Admitting: Family Medicine

## 2018-12-20 ENCOUNTER — Other Ambulatory Visit: Payer: Self-pay | Admitting: *Deleted

## 2018-12-21 NOTE — Telephone Encounter (Signed)
Pt is scheduled for 12/24/18 @ 2pm

## 2018-12-21 NOTE — Telephone Encounter (Signed)
Pt scheduled for 12/24/18 @ 2pm

## 2018-12-24 ENCOUNTER — Ambulatory Visit: Payer: BLUE CROSS/BLUE SHIELD | Admitting: Family Medicine

## 2018-12-24 DIAGNOSIS — N951 Menopausal and female climacteric states: Secondary | ICD-10-CM

## 2018-12-24 NOTE — Progress Notes (Signed)
Sent text to listed number.  Called same number, LMOVM.  Called alternate number in EMR, couldn't LMOVM.   No show for visit.

## 2018-12-25 NOTE — Assessment & Plan Note (Signed)
No show

## 2019-01-01 ENCOUNTER — Other Ambulatory Visit: Payer: Self-pay | Admitting: Family Medicine

## 2019-02-04 ENCOUNTER — Other Ambulatory Visit: Payer: Self-pay | Admitting: Family Medicine

## 2019-02-05 NOTE — Telephone Encounter (Signed)
Electronic refill request. Valium Last office visit:   12/24/2018 Last Filled:    30 tablet 1 11/06/2018  Please advise.

## 2019-02-06 NOTE — Telephone Encounter (Signed)
Sent.  Needs f/u scheduled.

## 2019-02-07 NOTE — Telephone Encounter (Signed)
Letter mailed

## 2019-03-21 ENCOUNTER — Ambulatory Visit (INDEPENDENT_AMBULATORY_CARE_PROVIDER_SITE_OTHER): Payer: Self-pay | Admitting: Family Medicine

## 2019-03-21 ENCOUNTER — Other Ambulatory Visit: Payer: Self-pay | Admitting: Family Medicine

## 2019-03-21 VITALS — Wt 142.0 lb

## 2019-03-21 DIAGNOSIS — N951 Menopausal and female climacteric states: Secondary | ICD-10-CM

## 2019-03-21 DIAGNOSIS — F329 Major depressive disorder, single episode, unspecified: Secondary | ICD-10-CM

## 2019-03-21 DIAGNOSIS — E039 Hypothyroidism, unspecified: Secondary | ICD-10-CM

## 2019-03-21 DIAGNOSIS — R519 Headache, unspecified: Secondary | ICD-10-CM

## 2019-03-21 DIAGNOSIS — F32A Depression, unspecified: Secondary | ICD-10-CM

## 2019-03-21 DIAGNOSIS — R51 Headache: Secondary | ICD-10-CM

## 2019-03-21 MED ORDER — DIAZEPAM 10 MG PO TABS: 10.0000 mg | ORAL_TABLET | Freq: Every evening | ORAL | 1 refills | Status: DC | PRN

## 2019-03-21 MED ORDER — FLUOXETINE HCL 20 MG PO CAPS: 60.0000 mg | ORAL_CAPSULE | Freq: Every day | ORAL | 1 refills | Status: DC

## 2019-03-21 MED ORDER — LEVOTHYROXINE SODIUM 88 MCG PO TABS: ORAL_TABLET | ORAL | 1 refills | Status: DC

## 2019-03-21 MED ORDER — PROGESTERONE MICRONIZED 200 MG PO CAPS: 200.0000 mg | ORAL_CAPSULE | Freq: Every day | ORAL | 1 refills | Status: DC

## 2019-03-21 MED ORDER — ESTROGENS CONJUGATED 0.9 MG PO TABS: 0.9000 mg | ORAL_TABLET | Freq: Every day | ORAL | 1 refills | Status: DC

## 2019-03-21 MED ORDER — BUTALBITAL-APAP-CAFFEINE 50-325-40 MG PO TABS: ORAL_TABLET | ORAL | 3 refills | Status: DC

## 2019-03-21 NOTE — Telephone Encounter (Signed)
Last refilled on 11/22/2018 for #90 with 3 refills. LOV was today 03/21/2019

## 2019-03-21 NOTE — Progress Notes (Signed)
Interactive audio and video telecommunications were attempted between this provider and patient, however failed, due to patient having technical difficulties OR patient did not have access to video capability.  We continued and completed visit with audio only.   Virtual Visit via Telephone Note  I connected with patient on 03/21/19  at 3:25 PM  by telephone and verified that I am speaking with the correct person using two identifiers.  Location of patient: home.    Location of MD: [facility] Name of referring provider (if blank then none associated): Names per persons and role in encounter:  MD: Earlyne Iba, Patient: name listed above.    I discussed the limitations, risks, security and privacy concerns of performing an evaluation and management service by telephone and the availability of in person appointments. I also discussed with the patient that there may be a patient responsible charge related to this service. The patient expressed understanding and agreed to proceed.  CC: follow up.    History of Present Illness:   Hypothyroidism.  No ADE on med.  Compliant.  Due for labs.   HA.  Improved with fioricet and then dramamine if needed.  Her HAs have been worse after her father was ill and died, with the higher stress level due to that.   Mood changes.  Still on prozac 60mg  a day. Taking valium prn.  Father died this year and it has been a sig upheaval for patient.  She doesn't have SI/HI.  They cared for him at home.  Her tearfulness is getting some better.   HRT.  Still on combination of progesterone and premarin and not having hot flashes as bad as she did off medication.  She is sleeping better with current meds.  If she skips a day, then she'll have hot flashes.  She is due for a mammogram, encouraged when possible.    Observations/Objective: No apparent distress Speech normal.  Assessment and Plan: Hypothyroidism.  No ADE on med.  Compliant.  Due for labs. Discussed  with patient.  We will call her to get this set up.  HA.  Improved with fioricet and then dramamine if needed.  Her HAs have been worse after her father was ill and died, with the higher stress level due to that.   Mood changes.  Still on prozac 60mg  a day. Taking valium prn.  Father died this year and it has been a sig upheaval for patient.  She doesn't have SI/HI.  They cared for him at home.  Her tearfulness is getting some better.   HRT.  Still on combination of progesterone and premarin and not having hot flashes as bad as she did off medication.  She is sleeping better with current meds.  If she skips a day, then she'll have hot flashes.  She is due for a mammogram, encouraged when possible.    Follow Up Instructions: See above.  I discussed the assessment and treatment plan with the patient. The patient was provided an opportunity to ask questions and all were answered. The patient agreed with the plan and demonstrated an understanding of the instructions.   The patient was advised to call back or seek an in-person evaluation if the symptoms worsen or if the condition fails to improve as anticipated.  I provided 20 minutes of non-face-to-face time during this encounter.   Jessica Stain, MD

## 2019-03-21 NOTE — Telephone Encounter (Signed)
Sent via OV note.

## 2019-03-25 NOTE — Assessment & Plan Note (Signed)
Improved with fioricet and then dramamine if needed.  Her HAs have been worse after her father was ill and died, with the higher stress level due to that.  See discussion of mood changes.

## 2019-03-25 NOTE — Assessment & Plan Note (Signed)
No ADE on med.  Compliant.  Due for labs. Discussed with patient.  We will call her to get this set up.

## 2019-03-25 NOTE — Assessment & Plan Note (Signed)
Still on combination of progesterone and premarin and not having hot flashes as bad as she did off medication.  She is sleeping better with current meds.  If she skips a day, then she'll have hot flashes.  She is due for a mammogram, encouraged when possible.   Risk and benefit of current medications discussed with patient and she wanted to continue as is for now.  We talked about gradual slow taper at some point in the future, when possible.

## 2019-03-25 NOTE — Assessment & Plan Note (Signed)
Still on prozac 60mg  a day. Taking valium prn.  Father died this year and it has been a sig upheaval for patient.  She doesn't have SI/HI.  They cared for him at home.  Her tearfulness is getting some better.  I would continue as is since she is making some progress.

## 2019-04-17 ENCOUNTER — Other Ambulatory Visit: Payer: Self-pay | Admitting: Family Medicine

## 2019-04-17 NOTE — Telephone Encounter (Signed)
RX was filled on 03/21/2019 for 6 months worth of refills. RX denied-has plenty at the pharmacy

## 2019-05-02 ENCOUNTER — Other Ambulatory Visit: Payer: Self-pay | Admitting: Family Medicine

## 2019-05-02 NOTE — Telephone Encounter (Signed)
Electronic refill request. Diazepam Last office visit:   03/21/2019 Last Filled:    30 tablet 1 03/21/2019  Please advise.

## 2019-05-03 NOTE — Telephone Encounter (Signed)
This refill is too early, can refill in early 05/2019.

## 2019-05-28 ENCOUNTER — Other Ambulatory Visit: Payer: Self-pay | Admitting: Family Medicine

## 2019-05-28 NOTE — Telephone Encounter (Signed)
Valium 10 mg #30 with 1 refill [place] last filled 03/21/2019, last OV 03/2019, no upcoming OV scheduled

## 2019-05-29 NOTE — Telephone Encounter (Signed)
Sent. Thanks.   

## 2019-07-22 ENCOUNTER — Other Ambulatory Visit: Payer: Self-pay | Admitting: Family Medicine

## 2019-07-22 NOTE — Telephone Encounter (Signed)
Electronic refill request. Fioricet Last office visit:   03/21/2019 Last Filled:    90 tablet 3 03/21/2019  Please advise.

## 2019-07-23 NOTE — Telephone Encounter (Signed)
Sent. Thanks.   

## 2019-07-29 ENCOUNTER — Other Ambulatory Visit: Payer: Self-pay

## 2019-07-29 ENCOUNTER — Encounter: Payer: Self-pay | Admitting: Family Medicine

## 2019-07-29 ENCOUNTER — Ambulatory Visit (INDEPENDENT_AMBULATORY_CARE_PROVIDER_SITE_OTHER): Payer: No Typology Code available for payment source | Admitting: Family Medicine

## 2019-07-29 VITALS — BP 118/80 | HR 92 | Temp 96.8°F | Ht 63.0 in | Wt 136.2 lb

## 2019-07-29 DIAGNOSIS — N6320 Unspecified lump in the left breast, unspecified quadrant: Secondary | ICD-10-CM | POA: Diagnosis not present

## 2019-07-29 NOTE — Progress Notes (Signed)
This visit occurred during the SARS-CoV-2 public health emergency.  Safety protocols were in place, including screening questions prior to the visit, additional usage of staff PPE, and extensive cleaning of exam room while observing appropriate contact time as indicated for disinfecting solutions.  Her husband needed surgery after a fracture.  Discussed.    D/w pt about HRT and hot flashes d/w pt.  She isn't having as much trouble as prev.  Pain with intercourse is better with current meds.  I asked her to think about taper as tolerated- she would likely need a slow taper.  No nipple discharge.    She had another issue of which I was unaware until the office visit today.  She noted lump in the L breast, laterally.  Noted in the last few months.  Minimally tender.  No recent mammogram.  No lesion on the R breast.  She has noticed some inversion of the nipple on the left breast but not on the right breast.  She has not had a recent mammogram.  Meds, vitals, and allergies reviewed.   ROS: Per HPI unless specifically indicated in ROS section   nad ncat rrr ctab Chaperoned exam. Inversion L nipple with L breast mass, lateral, no LA in the axilla.  R breast and axilla wnl.

## 2019-07-29 NOTE — Patient Instructions (Signed)
We'll call about getting a mammogram set up.  You may need an ultrasound- we'll get that done if needed based on the mammogram.  Take care.  Glad to see you.

## 2019-07-31 DIAGNOSIS — N6320 Unspecified lump in the left breast, unspecified quadrant: Secondary | ICD-10-CM | POA: Insufficient documentation

## 2019-07-31 NOTE — Assessment & Plan Note (Signed)
Discussed with patient.  No change in meds at this point but she is going to need a diagnostic mammogram and likely an ultrasound.  We will get this set up.  She discussed with our referral coordinator before leaving the office today.  We will get the follow-up imaging go from there.  She agrees.

## 2019-08-01 ENCOUNTER — Ambulatory Visit
Admission: RE | Admit: 2019-08-01 | Discharge: 2019-08-01 | Disposition: A | Discharge status: Home / Self Care | Payer: No Typology Code available for payment source | Source: Ambulatory Visit | Attending: Family Medicine | Admitting: Family Medicine

## 2019-08-01 ENCOUNTER — Other Ambulatory Visit: Payer: Self-pay | Admitting: Family Medicine

## 2019-08-01 DIAGNOSIS — N6320 Unspecified lump in the left breast, unspecified quadrant: Secondary | ICD-10-CM | POA: Diagnosis present

## 2019-08-01 DIAGNOSIS — R928 Other abnormal and inconclusive findings on diagnostic imaging of breast: Secondary | ICD-10-CM

## 2019-08-05 ENCOUNTER — Other Ambulatory Visit: Payer: Self-pay | Admitting: Family Medicine

## 2019-08-05 NOTE — Telephone Encounter (Signed)
Electronic refill request. Diazepam Last office visit:   07/29/2019 Last Filled:     30 tablet 1 05/29/2019   Please advise.

## 2019-08-06 ENCOUNTER — Ambulatory Visit
Admission: RE | Admit: 2019-08-06 | Discharge: 2019-08-06 | Disposition: A | Discharge status: Home / Self Care | Payer: No Typology Code available for payment source | Source: Ambulatory Visit | Attending: Family Medicine | Admitting: Family Medicine

## 2019-08-06 DIAGNOSIS — R928 Other abnormal and inconclusive findings on diagnostic imaging of breast: Secondary | ICD-10-CM | POA: Diagnosis not present

## 2019-08-06 DIAGNOSIS — N6320 Unspecified lump in the left breast, unspecified quadrant: Secondary | ICD-10-CM

## 2019-08-06 HISTORY — PX: BREAST BIOPSY: SHX20

## 2019-08-06 NOTE — Telephone Encounter (Signed)
Sent. Thanks.   

## 2019-08-07 ENCOUNTER — Encounter: Payer: Self-pay | Admitting: *Deleted

## 2019-08-07 DIAGNOSIS — C50912 Malignant neoplasm of unspecified site of left female breast: Secondary | ICD-10-CM

## 2019-08-07 NOTE — Progress Notes (Signed)
Called patient to establish navigation services.  She is newly diagnosed with invasive breast cancer.  Patient would like to talk to medical oncology first prior to seeing a surgeon.  Referral to med/onc placed.  Will call patient back with an appointment.  States she will pick up her educational literature at her appointment.

## 2019-08-07 NOTE — Progress Notes (Signed)
Patient scheduled to see Dr. Rogue Bussing on 08/13/19 @ 2:30.

## 2019-08-14 ENCOUNTER — Other Ambulatory Visit: Payer: Self-pay

## 2019-08-14 ENCOUNTER — Inpatient Hospital Stay: Payer: No Typology Code available for payment source | Attending: Internal Medicine | Admitting: Internal Medicine

## 2019-08-14 ENCOUNTER — Inpatient Hospital Stay: Payer: No Typology Code available for payment source

## 2019-08-14 ENCOUNTER — Encounter: Payer: Self-pay | Admitting: Internal Medicine

## 2019-08-14 ENCOUNTER — Telehealth: Payer: Self-pay | Admitting: Family Medicine

## 2019-08-14 DIAGNOSIS — Z170 Estrogen receptor positive status [ER+]: Secondary | ICD-10-CM | POA: Insufficient documentation

## 2019-08-14 DIAGNOSIS — F4310 Post-traumatic stress disorder, unspecified: Secondary | ICD-10-CM | POA: Diagnosis not present

## 2019-08-14 DIAGNOSIS — J449 Chronic obstructive pulmonary disease, unspecified: Secondary | ICD-10-CM | POA: Insufficient documentation

## 2019-08-14 DIAGNOSIS — E785 Hyperlipidemia, unspecified: Secondary | ICD-10-CM | POA: Diagnosis not present

## 2019-08-14 DIAGNOSIS — F419 Anxiety disorder, unspecified: Secondary | ICD-10-CM | POA: Insufficient documentation

## 2019-08-14 DIAGNOSIS — Z79899 Other long term (current) drug therapy: Secondary | ICD-10-CM | POA: Diagnosis not present

## 2019-08-14 DIAGNOSIS — F329 Major depressive disorder, single episode, unspecified: Secondary | ICD-10-CM | POA: Insufficient documentation

## 2019-08-14 DIAGNOSIS — Z803 Family history of malignant neoplasm of breast: Secondary | ICD-10-CM | POA: Insufficient documentation

## 2019-08-14 DIAGNOSIS — C50412 Malignant neoplasm of upper-outer quadrant of left female breast: Secondary | ICD-10-CM

## 2019-08-14 DIAGNOSIS — Z87891 Personal history of nicotine dependence: Secondary | ICD-10-CM | POA: Insufficient documentation

## 2019-08-14 DIAGNOSIS — E039 Hypothyroidism, unspecified: Secondary | ICD-10-CM | POA: Insufficient documentation

## 2019-08-14 LAB — CBC WITH DIFFERENTIAL/PLATELET
Abs Immature Granulocytes: 0.02 10*3/uL (ref 0.00–0.07)
Basophils Absolute: 0.1 10*3/uL (ref 0.0–0.1)
Basophils Relative: 1 %
Eosinophils Absolute: 0.2 10*3/uL (ref 0.0–0.5)
Eosinophils Relative: 3 %
HCT: 38.6 % (ref 36.0–46.0)
Hemoglobin: 12.3 g/dL (ref 12.0–15.0)
Immature Granulocytes: 0 %
Lymphocytes Relative: 23 %
Lymphs Abs: 1.7 10*3/uL (ref 0.7–4.0)
MCH: 29.9 pg (ref 26.0–34.0)
MCHC: 31.9 g/dL (ref 30.0–36.0)
MCV: 93.7 fL (ref 80.0–100.0)
Monocytes Absolute: 0.6 10*3/uL (ref 0.1–1.0)
Monocytes Relative: 8 %
Neutro Abs: 4.7 10*3/uL (ref 1.7–7.7)
Neutrophils Relative %: 65 %
Platelets: 377 10*3/uL (ref 150–400)
RBC: 4.12 MIL/uL (ref 3.87–5.11)
RDW: 12.3 % (ref 11.5–15.5)
WBC: 7.3 10*3/uL (ref 4.0–10.5)
nRBC: 0 % (ref 0.0–0.2)

## 2019-08-14 LAB — COMPREHENSIVE METABOLIC PANEL
ALT: 17 U/L (ref 0–44)
AST: 21 U/L (ref 15–41)
Albumin: 3.7 g/dL (ref 3.5–5.0)
Alkaline Phosphatase: 171 U/L — ABNORMAL HIGH (ref 38–126)
Anion gap: 10 (ref 5–15)
BUN: 11 mg/dL (ref 6–20)
CO2: 25 mmol/L (ref 22–32)
Calcium: 8.8 mg/dL — ABNORMAL LOW (ref 8.9–10.3)
Chloride: 103 mmol/L (ref 98–111)
Creatinine, Ser: 0.86 mg/dL (ref 0.44–1.00)
GFR calc Af Amer: >60 mL/min (ref >60)
GFR calc non Af Amer: >60 mL/min (ref >60)
Glucose, Bld: 101 mg/dL — ABNORMAL HIGH (ref 70–99)
Potassium: 3.4 mmol/L — ABNORMAL LOW (ref 3.5–5.1)
Sodium: 138 mmol/L (ref 135–145)
Total Bilirubin: 0.4 mg/dL (ref 0.3–1.2)
Total Protein: 7.6 g/dL (ref 6.5–8.1)

## 2019-08-14 LAB — PROTIME-INR
INR: 0.9 (ref 0.8–1.2)
Prothrombin Time: 12.3 seconds (ref 11.4–15.2)

## 2019-08-14 LAB — APTT: aPTT: 26 seconds (ref 24–36)

## 2019-08-14 MED ORDER — DIAZEPAM 10 MG PO TABS: 10.0000 mg | ORAL_TABLET | Freq: Every evening | ORAL | Status: DC | PRN

## 2019-08-14 MED ORDER — ESTROGENS CONJUGATED 0.45 MG PO TABS: 0.4500 mg | ORAL_TABLET | Freq: Every day | ORAL | 0 refills | Status: DC

## 2019-08-14 MED ORDER — PROGESTERONE MICRONIZED 100 MG PO CAPS: 100.0000 mg | ORAL_CAPSULE | Freq: Every day | ORAL | 0 refills | Status: DC

## 2019-08-14 MED ORDER — CITALOPRAM HYDROBROMIDE 40 MG PO TABS: 40.0000 mg | ORAL_TABLET | Freq: Every day | ORAL | 3 refills | Status: DC

## 2019-08-14 NOTE — Assessment & Plan Note (Addendum)
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  I had a long discussion with the patient in general regarding the treatment options of early stage breast cancer including-surgery; adjuvant radiation; role of adjuvant systemic therapy including-chemotherapy antihormone therapy. Patient will likely need lumpectomy with sentinel lymph node evaluation; followed by radiation.    #Discussed given pending HER-2/neu status-it is difficult to assess her treatment plan at this time.  If she is HER-2/neu positive-I would recommend neoadjuvant chemotherapy followed by surgery.  If she is HER-2/neu negative-I think is reasonable to proceed with surgery first.  The recommendation for chemotherapy will follow based upon on molecular testing [Oncotype]/lymph node status etc.   #Postmenopausal symptoms-patient reluctant with stopping estrogen/progesterone pills at this time.  Discussed with Dr. Kerrin Mo agreement tapering the hormone therapy over 1 week-given patient's extreme reluctance/anxiety of relapse of her symptoms.  Also recommend switching from Prozac to Celexa 40 mg a day.  His office will call regarding directions/prescriptions.   # Thank you Dr.Duncan for allowing me to participate in the care of your pleasant patient. Please do not hesitate to contact me with questions or concerns in the interim. Also discussed with Sheena, breast navigator.  Also discussed with the patient that she should choose to proceed with a surgeon of her choice as soon as possible.  We will also discuss at tumor conference on 12/31.   # DISPOSITION: [phone]/[id number].  # labs today- cbc/cmp # follow up TBD-Dr.B

## 2019-08-14 NOTE — Telephone Encounter (Signed)
I don't know the surgeons at [facility].  The oncology team may be able to suggest options.   She has rx for valium.  Would would take 1 tab at night for sleep and 1/2 tab in the day as needed for anxiety (sedation caution).  That plus the citalopram should help.  Thanks.

## 2019-08-14 NOTE — Telephone Encounter (Signed)
Please call patient.  I just had a conversation with Dr. Rogue Bussing. We are in agreement about the following plan given her recent diagnosis. It makes sense to taper her off of her hormone replacement.  The previous plan I had proposed (with a very slow taper) was prior to her diagnosis of cancer.   The goal would be to decrease her hormone replacement by 50% over the next week then stop.  I do not think she can cut her current medications in half so I sent a prescription for 7 pills of the lower dose on each medication.  She can take that for 1 week then stop. In the meantime I would stop Prozac and change over to citalopram 40 mg a day.  She can start citalopram the day after she takes her last dose of Prozac. Update me as needed.  Thanks.  I appreciate the help of all involved.

## 2019-08-14 NOTE — Telephone Encounter (Signed)
Patient returned phone call. I advised her of Dr. Josefine Class comments and recommendations regarding decreasing and stopping hormone replacement, and changing over from Prozac to Citalopram. Patient verbalized understanding.  Patient stated she would like Dr. Josefine Class input on who he recommends for a surgeon at [facility]? Also, patient states she would like a medication to help calm her nerves. Patient states her new cancer diagnoses has just happened so fast, and she is very overwhelmed and can not sleep. Patient is wondering if Dr. Damita Dunnings can prescribe her something to help with this? Please advise. Thank you!

## 2019-08-14 NOTE — Telephone Encounter (Signed)
Left message for pt to call office

## 2019-08-14 NOTE — Progress Notes (Signed)
[facility] NOTE  Patient Care Team: Tonia Ghent, [name], [name]  #  Oncology History Overview Note  # DEC 2020- LEFT BREAST- [place]; ER/PR >90%; Her 2-IHC-2+; FISH-pending.G-3; mammo/US- 1.9 x 1.6 x 2.3 cm lobular hypoechoic mass left breast 2 o'clock position   #   # SURVIVORSHIP:   DIAGNOSIS:   STAGE:         ;  GOALS:  CURRENT/MOST RECENT THERAPY :     Carcinoma of upper-outer quadrant of left breast in female, estrogen receptor positive ([place])  08/14/2019 Initial Diagnosis   Carcinoma of upper-outer quadrant of left breast in female, estrogen receptor positive ([place])      HISTORY OF PRESENTING ILLNESS:  Jessica Barnes 58 y.o.  female female with no prior history of breast cancer/or malignancies has been referred to [place] for further evaluation recommendations for new diagnosis of breast cancer.  Patient noted to have a lump in the left breast approximately 4 months ago.  However in the last 2 weeks or so it was slightly different.  This led to further evaluation with mammogram/ultrasound biopsy/pathology as above.  Family history of breast cancer: paternal grandmother/aunt/ aunt daughter ;   Family history of other cancers: maternal uncle- pancreatic cancer Menopause: 3 years Used OCP: years ago Used estrogen and progesterone therapy: on premarin for post- menopausal x 2-3 years History of Radiation to the chest:  Number of pregnancies: one Previous biopsy: none    Patient has history of chronic headaches.  Has chronic depression well controlled-on Prozac.  Review of Systems  Constitutional: Negative for chills, diaphoresis, fever, malaise/fatigue and weight loss.  HENT: Negative for nosebleeds and sore throat.   Eyes: Negative for double vision.  Respiratory: Negative for cough, hemoptysis, sputum production,  shortness of breath and wheezing.   Cardiovascular: Negative for chest pain, palpitations, orthopnea and leg swelling.  Gastrointestinal: Negative for abdominal pain, blood in stool, constipation, diarrhea, heartburn, melena, nausea and vomiting.  Genitourinary: Negative for dysuria, frequency and urgency.  Musculoskeletal: Negative for back pain and joint pain.  Skin: Negative.  Negative for itching and rash.  Neurological: Positive for headaches. Negative for dizziness, tingling, focal weakness and weakness.  Endo/Heme/Allergies: Does not bruise/bleed easily.  Psychiatric/Behavioral: Positive for depression. The patient is nervous/anxious. The patient does not have insomnia.      MEDICAL HISTORY:  Past Medical History:  Diagnosis Date  . Anxiety 10/13/96  . Chest pain    atypical, hosp for that and depression 8/10-8/11/08  . COPD (chronic obstructive pulmonary disease) ([place]) 09/15/05   by x-ray  . Depression 10/13/96   hospital 8/10-8/11/08.  notable history: son with TBI after MVA  . Hyperlipidemia 09/15/98  . Hypothyroidism 01/14/96  . Migraine    27 yoa  . NSVD (normal spontaneous vaginal delivery)    x 1  . PTSD (post-traumatic stress disorder)    likely PTSD after son's MVA/TBI in 2007, s/p counseling    SURGICAL HISTORY: Past Surgical History:  Procedure Laterality Date  . APPENDECTOMY     58 years of age  . BREAST BIOPSY Left 08/06/2019   [place] bx/ ribbon clip/ path pending    SOCIAL HISTORY: Social History   Socioeconomic History  . Marital status: Married    Spouse name: Not on file  . Number of children: Not on file  . Years of education: Not on  file  . Highest education level: Not on file  Occupational History  . Not on file  Tobacco Use  . Smoking status: Former Smoker    Packs/day: 0.50    Years: 8.00    Pack years: 4.00    Types: Cigarettes  . Smokeless tobacco: Never Used  . Tobacco comment: quit 04/15/2014  Substance and Sexual Activity  . Alcohol use:  No    Alcohol/week: 0.0 standard drinks  . Drug use: No  . Sexual activity: Not on file  Other Topics Concern  . Not on file  Social History Narrative   Remarried, lives with husband   1 son, he had TBI and short term memory loss after prolonged illness   Social Determinants of Health   Financial Resource Strain:   . Difficulty of Paying Living Expenses: Not on file  Food Insecurity:   . Worried About Charity fundraiser in the Last Year: Not on file  . Ran Out of Food in the Last Year: Not on file  Transportation Needs:   . Lack of Transportation (Medical): Not on file  . Lack of Transportation (Non-Medical): Not on file  Physical Activity:   . Days of Exercise per Week: Not on file  . Minutes of Exercise per Session: Not on file  Stress:   . Feeling of Stress : Not on file  Social Connections:   . Frequency of Communication with Friends and Family: Not on file  . Frequency of Social Gatherings with Friends and Family: Not on file  . Attends Religious Services: Not on file  . Active Member of Clubs or Organizations: Not on file  . Attends Archivist Meetings: Not on file  . Marital Status: Not on file  Intimate Partner Violence:   . Fear of Current or Ex-Partner: Not on file  . Emotionally Abused: Not on file  . Physically Abused: Not on file  . Sexually Abused: Not on file    FAMILY HISTORY: Family History  Problem Relation Age of Onset  . Hypertension Mother   . Colon cancer Father   . Thyroid disease Sister   . Colon cancer Paternal Aunt   . Breast cancer Neg Hx     ALLERGIES:  is allergic to sulfamethoxazole-trimethoprim and codeine.  MEDICATIONS:  Current Outpatient Medications  Medication Sig Dispense Refill  . acetaminophen (TYLENOL) 500 MG tablet Take 1,000 mg by mouth every 6 (six) hours as needed for mild pain or headache.    . butalbital-acetaminophen-caffeine (FIORICET) 50-325-40 MG tablet TAKE 1 TABLET BY MOUTH EVERY 4 TO 6 HOURS AS  NEEDED FOR HEADACHE 90 tablet 3  . diazepam (VALIUM) 10 MG tablet TAKE 1 TABLET BY MOUTH AT BEDTIME AS NEEDED FOR SLEEP 30 tablet 1  . dimenhyDRINATE (DRAMAMINE) 50 MG tablet Take 50 mg by mouth every 8 (eight) hours as needed.    Marland Kitchen levothyroxine (SYNTHROID) 88 MCG tablet TAKE 1 TABLET BY MOUTH DAILY EXCEPT ON SUNDAYS TAKE 1/2 TABLET 90 tablet 1  . loperamide (IMODIUM A-D) 2 MG tablet Take 4 mg by mouth 4 (four) times daily as needed for diarrhea or loose stools.    . citalopram (CELEXA) 40 MG tablet Take 1 tablet (40 mg total) by mouth daily. 90 tablet 3  . estrogens, conjugated, (PREMARIN) 0.45 MG tablet Take 1 tablet (0.45 mg total) by mouth daily. For 1 week then stop. 7 tablet 0  . progesterone (PROMETRIUM) 100 MG capsule Take 1 capsule (100 mg total) by mouth  daily. Then stop after 1 week. 7 capsule 0   No current facility-administered medications for this visit.      Marland Kitchen  PHYSICAL EXAMINATION: ECOG PERFORMANCE STATUS: 0 - Asymptomatic  Vitals:   08/14/19 1436  BP: 118/70  Pulse: 92  Temp: (!) 96.1 F (35.6 C)   Filed Weights   08/14/19 1436  Weight: 135 lb (61.2 kg)    Physical Exam  Constitutional: She is oriented to person, place, and time and well-developed, well-nourished, and in no distress.  HENT:  Head: Normocephalic and atraumatic.  Mouth/Throat: Oropharynx is clear and moist. No oropharyngeal exudate.  Eyes: Pupils are equal, round, and reactive to light.  Cardiovascular: Normal rate and regular rhythm.  Pulmonary/Chest: Effort normal and breath sounds normal. No respiratory distress. She has no wheezes.  Abdominal: Soft. Bowel sounds are normal. She exhibits no distension and no mass. There is no abdominal tenderness. There is no rebound and no guarding.  Musculoskeletal:        General: No tenderness or edema. Normal range of motion.     Cervical back: Normal range of motion and neck supple.  Neurological: She is alert and oriented to person, place, and time.   Skin: Skin is warm.  Right and left BREAST exam (in the presence of nurse)- no unusual skin changes or dominant masses felt in the right breast.  Left breast 2 o'clock position-approximately 2 cm mobile mass noted.  Puckering of the nipple noted.  Otherwise no skin changes no skin edema.   Psychiatric: Affect normal.  Anxious   LABORATORY DATA:  I have reviewed the data as listed Lab Results  Component Value Date   WBC 7.1 05/07/2009   HGB 12.9 01/08/2014   HCT 38.0 01/08/2014   MCV 95.7 05/07/2009   PLT 309.0 05/07/2009   No results for input(s): NA, K, CL, CO2, GLUCOSE, BUN, CREATININE, CALCIUM, GFRNONAA, GFRAA, PROT, ALBUMIN, AST, ALT, ALKPHOS, BILITOT, BILIDIR, IBILI in the last 8760 hours.  RADIOGRAPHIC STUDIES: I have personally reviewed the radiological images as listed and agreed with the findings in the report. US BREAST LTD UNI LEFT INC AXILLA  Result Date: 08/01/2019 CLINICAL DATA:  Patient presents for evaluation of palpable abnormality within the left breast. EXAM: DIGITAL DIAGNOSTIC BILATERAL MAMMOGRAM WITH CAD AND TOMO ULTRASOUND LEFT BREAST COMPARISON:  None ACR Breast Density Category c: The breast tissue is heterogeneously dense, which may obscure small masses. FINDINGS: Underlying the palpable marker within the upper-outer left breast is a 2.2 cm spiculated mass. No additional suspicious abnormalities identified within the left breast. Within the posterior aspect of the right breast superiorly there is a small density which appears to efface on additional imaging suggestive of dense fibroglandular tissue. Mammographic images were processed with CAD. On physical exam, there is a palpable mass within the upper outer left breast. Targeted ultrasound is performed, showing a 1.9 x 1.6 x 2.3 cm lobular hypoechoic mass left breast 2 o'clock position 3 cm from nipple. No left axillary adenopathy. No suspicious abnormality within the upper-outer right breast to correspond with  mammographic density. Dense tissue is visualized. IMPRESSION: Suspicious left breast mass 2 o'clock position 3 cm from nipple. RECOMMENDATION: Ultrasound-guided core needle biopsy left breast mass 2 o'clock position. I have discussed the findings and recommendations with the patient. If applicable, a reminder letter will be sent to the patient regarding the next appointment. BI-RADS CATEGORY  5: Highly suggestive of malignancy. Electronically Signed   By: Lovey Newcomer M.D.   On:  08/01/2019 12:32   US BREAST LTD UNI RIGHT INC AXILLA  Result Date: 08/01/2019 CLINICAL DATA:  Patient presents for evaluation of palpable abnormality within the left breast. EXAM: DIGITAL DIAGNOSTIC BILATERAL MAMMOGRAM WITH CAD AND TOMO ULTRASOUND LEFT BREAST COMPARISON:  None ACR Breast Density Category c: The breast tissue is heterogeneously dense, which may obscure small masses. FINDINGS: Underlying the palpable marker within the upper-outer left breast is a 2.2 cm spiculated mass. No additional suspicious abnormalities identified within the left breast. Within the posterior aspect of the right breast superiorly there is a small density which appears to efface on additional imaging suggestive of dense fibroglandular tissue. Mammographic images were processed with CAD. On physical exam, there is a palpable mass within the upper outer left breast. Targeted ultrasound is performed, showing a 1.9 x 1.6 x 2.3 cm lobular hypoechoic mass left breast 2 o'clock position 3 cm from nipple. No left axillary adenopathy. No suspicious abnormality within the upper-outer right breast to correspond with mammographic density. Dense tissue is visualized. IMPRESSION: Suspicious left breast mass 2 o'clock position 3 cm from nipple. RECOMMENDATION: Ultrasound-guided core needle biopsy left breast mass 2 o'clock position. I have discussed the findings and recommendations with the patient. If applicable, a reminder letter will be sent to the patient  regarding the next appointment. BI-RADS CATEGORY  5: Highly suggestive of malignancy. Electronically Signed   By: Lovey Newcomer M.D.   On: 08/01/2019 12:32   MM DIAG BREAST TOMO BILATERAL  Result Date: 08/01/2019 CLINICAL DATA:  Patient presents for evaluation of palpable abnormality within the left breast. EXAM: DIGITAL DIAGNOSTIC BILATERAL MAMMOGRAM WITH CAD AND TOMO ULTRASOUND LEFT BREAST COMPARISON:  None ACR Breast Density Category c: The breast tissue is heterogeneously dense, which may obscure small masses. FINDINGS: Underlying the palpable marker within the upper-outer left breast is a 2.2 cm spiculated mass. No additional suspicious abnormalities identified within the left breast. Within the posterior aspect of the right breast superiorly there is a small density which appears to efface on additional imaging suggestive of dense fibroglandular tissue. Mammographic images were processed with CAD. On physical exam, there is a palpable mass within the upper outer left breast. Targeted ultrasound is performed, showing a 1.9 x 1.6 x 2.3 cm lobular hypoechoic mass left breast 2 o'clock position 3 cm from nipple. No left axillary adenopathy. No suspicious abnormality within the upper-outer right breast to correspond with mammographic density. Dense tissue is visualized. IMPRESSION: Suspicious left breast mass 2 o'clock position 3 cm from nipple. RECOMMENDATION: Ultrasound-guided core needle biopsy left breast mass 2 o'clock position. I have discussed the findings and recommendations with the patient. If applicable, a reminder letter will be sent to the patient regarding the next appointment. BI-RADS CATEGORY  5: Highly suggestive of malignancy. Electronically Signed   By: Lovey Newcomer M.D.   On: 08/01/2019 12:32   MM CLIP PLACEMENT LEFT  Result Date: 08/06/2019 CLINICAL DATA:  Biopsy of a left breast mass. Evaluate clip placement. EXAM: DIAGNOSTIC LEFT MAMMOGRAM POST ULTRASOUND BIOPSY COMPARISON:  Previous  exam(s). FINDINGS: Mammographic images were obtained following ultrasound guided biopsy of a left breast mass. The biopsy marking clip is in expected position at the site of biopsy. IMPRESSION: Appropriate positioning of the ribbon shaped biopsy marking clip at the site of biopsy in the biopsied left breast mass. Final Assessment: Post Procedure Mammograms for Marker Placement Electronically Signed   By: Dorise Bullion III M.D   On: 08/06/2019 09:07   [place] LT BREAST BX  W LOC DEV 1ST LESION IMG BX SPEC US GUIDE  Addendum Date: 08/07/2019   ADDENDUM REPORT: 08/07/2019 11:18 ADDENDUM: PATHOLOGY revealed: A. BREAST, LEFT 2:00 3 CM FN; ULTRASOUND-GUIDED BIOPSY: - INVASIVE MAMMARY CARCINOMA, NO SPECIAL TYPE: 12 mm in this sample. Grade 2. Ductal carcinoma in situ: Present, nuclear grade 2, with focal comedonecrosis. Lymphovascular invasion: Not identified. Pathology results are CONCORDANT with imaging findings, per Dr. Dorise Bullion. I telephoned patient on 08/07/2019 and discussed biopsy results and recommendations stated below. All questions were answered. Patient denies significant pain or bleeding from the biopsy site. Biopsy site care instructions were reviewed and patient was instructed to call [facility] with any concerns or questions related to the biopsy. Recommendation: Surgical referral. Request for surgical referral was relayed to nurse navigators at [facility] by Electa Sniff RN on 08/07/2019. Addendum by Electa Sniff RN on 08/07/2019. Electronically Signed   By: Dorise Bullion III M.D   On: 08/07/2019 11:18   Result Date: 08/07/2019 CLINICAL DATA:  Left breast mass EXAM: ULTRASOUND GUIDED LEFT BREAST CORE NEEDLE BIOPSY COMPARISON:  Previous exam(s). FINDINGS: I met with the patient and we discussed the procedure of ultrasound-guided biopsy, including benefits and alternatives. We discussed the high likelihood of a successful procedure. We discussed the risks of the  procedure, including infection, bleeding, tissue injury, clip migration, and inadequate sampling. Informed written consent was given. The usual time-out protocol was performed immediately prior to the procedure. Lesion quadrant: 2 o'clock Using sterile technique and 1% Lidocaine as local anesthetic, under direct ultrasound visualization, a 12 gauge spring-loaded device was used to perform biopsy of the left breast mass using a lateral approach. At the conclusion of the procedure tissue marker clip was deployed into the biopsy cavity. Follow up 2 view mammogram was performed and dictated separately. IMPRESSION: Ultrasound guided biopsy of a left breast mass. No apparent complications. Electronically Signed: By: Dorise Bullion III M.D On: 08/06/2019 08:57    ASSESSMENT & PLAN:   Carcinoma of upper-outer quadrant of left breast in female, estrogen receptor positive ([place]) #Left breast cancer -ER/PR positive HER-2 pending ; stage I/stage II-early breast cancer.  I had a long discussion with the patient in general regarding the treatment options of early stage breast cancer including-surgery; adjuvant radiation; role of adjuvant systemic therapy including-chemotherapy antihormone therapy. Patient will likely need lumpectomy with sentinel lymph node evaluation; followed by radiation.    #Discussed given pending HER-2/neu status-it is difficult to assess her treatment plan at this time.  If she is HER-2/neu positive-I would recommend neoadjuvant chemotherapy followed by surgery.  If she is HER-2/neu negative-I think is reasonable to proceed with surgery first.  The recommendation for chemotherapy will follow based upon on molecular testing [Oncotype]/lymph node status etc.   #Postmenopausal symptoms-patient reluctant with stopping estrogen/progesterone pills at this time.  Discussed with Dr. Kerrin Mo agreement tapering the hormone therapy over 1 week-given patient's extreme reluctance/anxiety of relapse of her  symptoms.  Also recommend switching from Prozac to Celexa 40 mg a day.  His office will call regarding directions/prescriptions.   # Thank you Dr.Duncan for allowing me to participate in the care of your pleasant patient. Please do not hesitate to contact me with questions or concerns in the interim. Also discussed with Sheena, breast navigator.  Also discussed with the patient that she should choose to proceed with a surgeon of her choice as soon as possible.  We will also discuss at tumor conference on 12/31.   #  DISPOSITION: [phone]/cell.  # labs today- cbc/cmp # follow up TBD-Dr.B  All questions were answered. The patient/family knows to call the clinic with any problems, questions or concerns.    Cammie Sickle, MD 08/14/2019 3:57 PM

## 2019-08-15 ENCOUNTER — Encounter: Payer: Self-pay | Admitting: *Deleted

## 2019-08-15 ENCOUNTER — Other Ambulatory Visit: Payer: No Typology Code available for payment source

## 2019-08-15 NOTE — Telephone Encounter (Signed)
Patient advised.

## 2019-08-15 NOTE — Progress Notes (Signed)
Met patient and her husband yesterday during her initial medical oncology consult with Dr. Brahmanday.  Husband calls back today and states patient wants to go to [facility] for her care.  He asked for the name of physician there for a referral.  Discussed with Dr. Brahmanday and it was recommended for PCP to make the referral.  Reviewed this with the patients husband Jessica Barnes.  He asked about surgeons in [place].  I informed him of [facility].  They would like a referral to Dr. Wakefield.  I have sent an inbasket message to Sarah the breast referral coordinator to assist with scheduling the patient.  Informed Jessica Barnes I will call them early next week to follow up to see if patient has an appointment, and if they plan to return to the cancer center.  

## 2019-08-15 NOTE — Progress Notes (Signed)
Tumor Board Documentation  Jessica Barnes was presented by Dr Rogue Bussing at our Tumor Board on 08/15/2019, which included representatives from medical oncology, radiation oncology, navigation, pathology, radiology, surgical, surgical oncology, internal medicine, pharmacy, genetics, palliative care, research.  Jessica Barnes currently presents as a new patient, for Jessica Barnes, for new positive pathology with history of the following treatments: active survellience, surgical intervention(s).  Additionally, we reviewed previous medical and familial history, history of present illness, and recent lab results along with all available histopathologic and imaging studies. The tumor board considered available treatment options and made the following recommendations: Surgery, Chemotherapy, Additional screening Bone scan  The following procedures/referrals were also placed: No orders of the defined types were placed in this encounter.   Clinical Trial Status: not discussed   Staging used: Pathologic Stage  AJCC Staging:       Group: Stage 2  Breast Cancerand DCIS ER +, HER 2 pending  National site-specific guidelines   were discussed with respect to the case.  Tumor board is a meeting of clinicians from various specialty areas who evaluate and discuss patients for whom a multidisciplinary approach is being considered. Final determinations in the plan of care are those of the provider(s). The responsibility for follow up of recommendations given during tumor board is that of the provider.   Today's extended care, comprehensive team conference, Jessica Barnes was not present for the discussion and was not examined.   Multidisciplinary Tumor Board is a multidisciplinary case peer review process.  Decisions discussed in the Multidisciplinary Tumor Board reflect the opinions of the specialists present at the conference without having examined the patient.  Ultimately, treatment and diagnostic decisions rest with the  primary provider(s) and the patient.

## 2019-08-19 ENCOUNTER — Encounter: Payer: Self-pay | Admitting: *Deleted

## 2019-08-19 ENCOUNTER — Telehealth: Payer: Self-pay | Admitting: Internal Medicine

## 2019-08-19 DIAGNOSIS — C50412 Malignant neoplasm of upper-outer quadrant of left female breast: Secondary | ICD-10-CM

## 2019-08-19 DIAGNOSIS — Z170 Estrogen receptor positive status [ER+]: Secondary | ICD-10-CM

## 2019-08-19 NOTE — Progress Notes (Signed)
Received message from Judson Roch at Dr. Belva Chimes office that patient is scheduled to see him on 08/23/19 @ 8:30.  Called and notified patient.  Discussed someone would call her with an appointment for her bone scan.  She is to call with any questions or needs.

## 2019-08-19 NOTE — Telephone Encounter (Signed)
On 12/31-I spoke to patient regarding the results of the elevated alkaline phosphatase; recommend a bone scan ASAP/also CT of the abdomen pelvis. Pt currently awaiting appt with Surgeon.   Please schedule the scans ASAP; and follow up with- MD VIDEO VISIT 1-2 days later.

## 2019-08-21 ENCOUNTER — Other Ambulatory Visit: Payer: Self-pay | Admitting: Pathology

## 2019-08-21 ENCOUNTER — Encounter: Payer: Self-pay | Admitting: *Deleted

## 2019-08-21 ENCOUNTER — Encounter: Payer: Self-pay | Admitting: Internal Medicine

## 2019-08-21 NOTE — Progress Notes (Signed)
Called and informed patient that her Her2 is negative.  Encouraged her to keep her appointments for her bone scan and surgical consult.  She is to call with any questions or needs.

## 2019-08-22 ENCOUNTER — Telehealth: Payer: Self-pay | Admitting: Internal Medicine

## 2019-08-22 NOTE — Telephone Encounter (Signed)
Spoke to patient/husband regarding results of HER-2 testing-negative.  Given ER/PR positive HER-2/neu negative disease-recommend upfront surgery; decisions for chemotherapy will be taken post surgery.  Elevated alkaline phosphatase-awaiting bone scan/CT abdomen pelvis next week; follow-up as planned with me post imaging.  GB --------------------------------------------------------------------  Dr.Wakefield- FYI as pt has appt with you on 1/8.

## 2019-08-22 NOTE — Telephone Encounter (Signed)
Thanks for note. I think I will see her Monday now. We moved some of offices due to weather tonight and tomorrow. Will get her scheduled and await the scans.  My phone is [phone] if you need anything quicker than epic.   Matt

## 2019-08-26 ENCOUNTER — Other Ambulatory Visit: Payer: Self-pay | Admitting: General Surgery

## 2019-08-26 DIAGNOSIS — N63 Unspecified lump in unspecified breast: Secondary | ICD-10-CM

## 2019-08-26 LAB — SURGICAL PATHOLOGY

## 2019-08-28 ENCOUNTER — Ambulatory Visit
Admission: RE | Admit: 2019-08-28 | Discharge: 2019-08-28 | Disposition: A | Discharge status: Home / Self Care | Payer: No Typology Code available for payment source | Source: Ambulatory Visit | Attending: Internal Medicine | Admitting: Internal Medicine

## 2019-08-28 ENCOUNTER — Encounter: Payer: Self-pay | Admitting: *Deleted

## 2019-08-28 ENCOUNTER — Other Ambulatory Visit: Payer: Self-pay | Admitting: General Surgery

## 2019-08-28 ENCOUNTER — Other Ambulatory Visit: Payer: Self-pay

## 2019-08-28 ENCOUNTER — Encounter
Admission: RE | Admit: 2019-08-28 | Discharge: 2019-08-28 | Disposition: A | Discharge status: Home / Self Care | Payer: No Typology Code available for payment source | Source: Ambulatory Visit | Attending: Internal Medicine | Admitting: Internal Medicine

## 2019-08-28 DIAGNOSIS — Z170 Estrogen receptor positive status [ER+]: Secondary | ICD-10-CM | POA: Diagnosis not present

## 2019-08-28 DIAGNOSIS — C50412 Malignant neoplasm of upper-outer quadrant of left female breast: Secondary | ICD-10-CM | POA: Diagnosis present

## 2019-08-28 MED ORDER — TECHNETIUM TC 99M MEDRONATE IV KIT
22.0800 | PACK | Freq: Once | INTRAVENOUS | Status: AC | PRN
  Administered 2019-08-28: 22.08 via INTRAVENOUS

## 2019-08-28 NOTE — Progress Notes (Signed)
Patient's husband called to confirm if CT was only for the abdomen and pelvis.  Discussed with Dr Rogue Bussing and he confirmed CT of abdomen and pelvis only, along with her bone scan.  Bone scan is today and CT is on the 15th.  They are to call back with any questions or needs.

## 2019-08-29 ENCOUNTER — Other Ambulatory Visit: Payer: Self-pay | Admitting: General Surgery

## 2019-08-29 ENCOUNTER — Telehealth: Payer: Self-pay | Admitting: Genetic Counselor

## 2019-08-29 DIAGNOSIS — N63 Unspecified lump in unspecified breast: Secondary | ICD-10-CM

## 2019-08-29 NOTE — Telephone Encounter (Signed)
Received an urgent genetics referrals from Dr. Donne Hazel for breast cancer. Jessica Barnes has been cld and scheduled for genetics on 1/18 at 11am. Pt didn't have an email, but verified her mobile phone for the link to be sent to her for the visit. Pt has agreed to appt date and time.

## 2019-08-29 NOTE — Progress Notes (Signed)
I tried to reach pt/husband- unable to reach. Can't leave voicemail.   H/T- please reach out to pt/daughter- re: the need to discuss with me re: plan- x-rays left ribs/ T spine MRI. Please inform me when you get them on the phone.   GB

## 2019-08-30 ENCOUNTER — Other Ambulatory Visit: Payer: Self-pay

## 2019-08-30 ENCOUNTER — Ambulatory Visit
Admission: RE | Admit: 2019-08-30 | Discharge: 2019-08-30 | Disposition: A | Discharge status: Home / Self Care | Payer: No Typology Code available for payment source | Source: Ambulatory Visit | Attending: General Surgery | Admitting: General Surgery

## 2019-08-30 ENCOUNTER — Ambulatory Visit
Admission: RE | Admit: 2019-08-30 | Discharge: 2019-08-30 | Disposition: A | Discharge status: Home / Self Care | Payer: No Typology Code available for payment source | Source: Ambulatory Visit | Attending: Internal Medicine | Admitting: Internal Medicine

## 2019-08-30 ENCOUNTER — Ambulatory Visit: Payer: No Typology Code available for payment source

## 2019-08-30 DIAGNOSIS — N63 Unspecified lump in unspecified breast: Secondary | ICD-10-CM

## 2019-08-30 DIAGNOSIS — C50412 Malignant neoplasm of upper-outer quadrant of left female breast: Secondary | ICD-10-CM | POA: Diagnosis present

## 2019-08-30 DIAGNOSIS — Z170 Estrogen receptor positive status [ER+]: Secondary | ICD-10-CM | POA: Insufficient documentation

## 2019-08-30 MED ORDER — GADOBUTROL 1 MMOL/ML IV SOLN
6.0000 mL | Freq: Once | INTRAVENOUS | Status: AC | PRN
  Administered 2019-08-30: 6 mL via INTRAVENOUS

## 2019-08-30 MED ORDER — IOHEXOL 300 MG/ML  SOLN
75.0000 mL | Freq: Once | INTRAMUSCULAR | Status: AC | PRN
  Administered 2019-08-30: 75 mL via INTRAVENOUS

## 2019-09-02 ENCOUNTER — Inpatient Hospital Stay: Payer: No Typology Code available for payment source | Attending: Genetic Counselor | Admitting: Genetic Counselor

## 2019-09-02 ENCOUNTER — Other Ambulatory Visit: Payer: Self-pay | Admitting: General Surgery

## 2019-09-02 ENCOUNTER — Encounter: Payer: Self-pay | Admitting: Genetic Counselor

## 2019-09-02 DIAGNOSIS — N6459 Other signs and symptoms in breast: Secondary | ICD-10-CM

## 2019-09-02 DIAGNOSIS — Z800 Family history of malignant neoplasm of digestive organs: Secondary | ICD-10-CM | POA: Insufficient documentation

## 2019-09-02 DIAGNOSIS — C50412 Malignant neoplasm of upper-outer quadrant of left female breast: Secondary | ICD-10-CM | POA: Diagnosis not present

## 2019-09-02 DIAGNOSIS — N6320 Unspecified lump in the left breast, unspecified quadrant: Secondary | ICD-10-CM

## 2019-09-02 DIAGNOSIS — Z170 Estrogen receptor positive status [ER+]: Secondary | ICD-10-CM

## 2019-09-02 DIAGNOSIS — Z803 Family history of malignant neoplasm of breast: Secondary | ICD-10-CM

## 2019-09-02 NOTE — Progress Notes (Signed)
REFERRING PROVIDER: Cammie Sickle, MD [place],  [place] [id number]  PRIMARY PROVIDER:  Tonia Ghent, MD  PRIMARY REASON FOR VISIT:  1. Carcinoma of upper-outer quadrant of left breast in female, estrogen receptor positive ([place])   2. Family history of breast cancer   3. Family history of colon cancer   4. Family history of pancreatic cancer      HISTORY OF PRESENT ILLNESS:  I connected with  Jessica Barnes on 09/02/2019 at 11:00 AM EDT by MyChart video conference and verified that I am speaking with the correct person using two identifiers. The connection was choppy.  Patient location: Home Provider location: [facility]   Jessica Barnes, a 59 y.o. female, was seen for a [place] cancer genetics consultation at the request of Dr. Rogue Bussing due to a personal and family history of breast cancer and family history of colon and pancreatic cancer.  Jessica Barnes presents to clinic today to discuss the possibility of a hereditary predisposition to cancer, genetic testing, and to further clarify her future cancer risks, as well as potential cancer risks for family members.   In December 2020, at the age of 89, Jessica Barnes was diagnosed with cancer of the left breast. The treatment plan is still being determined.    CANCER HISTORY:  Oncology History Overview Note  # DEC 2020- LEFT BREAST- [place]; ER/PR >90%; Her 2-IHC-2+; FISH-pending.G-3; mammo/US- 1.9 x 1.6 x 2.3 cm lobular hypoechoic mass left breast 2 o'clock position   #   # SURVIVORSHIP:   DIAGNOSIS:   STAGE:         ;  GOALS:  CURRENT/MOST RECENT THERAPY :     Carcinoma of upper-outer quadrant of left breast in female, estrogen receptor positive ([place])  08/14/2019 Initial Diagnosis   Carcinoma of upper-outer quadrant of left breast in female, estrogen receptor positive ([place])      RISK FACTORS:  Menarche was at age 46.  First live birth at age 4.  Ovaries intact: yes.  Hysterectomy: no.    Menopausal status: postmenopausal.  HRT use: 4 years. Colonoscopy: no; not examined. Mammogram within the last year: yes. Number of breast biopsies: 1. Any excessive radiation exposure in the past: no  Past Medical History:  Diagnosis Date  . Anxiety 10/13/96  . Chest pain    atypical, hosp for that and depression 8/10-8/11/08  . COPD (chronic obstructive pulmonary disease) ([place]) 09/15/05   by x-ray  . Depression 10/13/96   hospital 8/10-8/11/08.  notable history: son with TBI after MVA  . Family history of breast cancer   . Family history of colon cancer   . Family history of pancreatic cancer   . Hyperlipidemia 09/15/98  . Hypothyroidism 01/14/96  . Migraine    27 yoa  . NSVD (normal spontaneous vaginal delivery)    x 1  . PTSD (post-traumatic stress disorder)    likely PTSD after son's MVA/TBI in 2007, s/p counseling    Past Surgical History:  Procedure Laterality Date  . APPENDECTOMY     59 years of age  . BREAST BIOPSY Left 08/06/2019   [place] bx/ ribbon clip/ path pending    Social History   Socioeconomic History  . Marital status: Married    Spouse name: Not on file  . Number of children: Not on file  . Years of education: Not on file  . Highest education level: Not on file  Occupational History  . Not on file  Tobacco Use  .  Smoking status: Former Smoker    Packs/day: 0.50    Years: 8.00    Pack years: 4.00    Types: Cigarettes  . Smokeless tobacco: Never Used  . Tobacco comment: quit 04/15/2014  Substance and Sexual Activity  . Alcohol use: No    Alcohol/week: 0.0 standard drinks  . Drug use: No  . Sexual activity: Not on file  Other Topics Concern  . Not on file  Social History Narrative   Remarried, lives with husband 1 son, he had TBI and short term memory loss after prolonged illness      #Patient lives in [place] with her husband; prior history of smoking/currently vapes.  No significant alcohol use.  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     Social Determinants of Health   Financial Resource Strain:   . Difficulty of Paying Living Expenses: Not on file  Food Insecurity:   . Worried About Charity fundraiser in the Last Year: Not on file  . Ran Out of Food in the Last Year: Not on file  Transportation Needs:   . Lack of Transportation (Medical): Not on file  . Lack of Transportation (Non-Medical): Not on file  Physical Activity:   . Days of Exercise per Week: Not on file  . Minutes of Exercise per Session: Not on file  Stress:   . Feeling of Stress : Not on file  Social Connections:   . Frequency of Communication with Friends and Family: Not on file  . Frequency of Social Gatherings with Friends and Family: Not on file  . Attends Religious Services: Not on file  . Active Member of Clubs or Organizations: Not on file  . Attends Archivist Meetings: Not on file  . Marital Status: Not on file     FAMILY HISTORY:  We obtained a detailed, 4-generation family history.  Significant diagnoses are listed below: Family History  Problem Relation Age of Onset  . Hypertension Mother   . Colon cancer Father        d. 23  . Thyroid disease Sister   . Breast cancer Paternal Aunt   . Breast cancer Paternal Grandmother   . Colon cancer Paternal Grandmother   . Breast cancer Cousin        paternal aunt's daughter  . Colon cancer Paternal Uncle        3 pat uncles with colon cancer at unknown ages  . Pancreatic cancer Maternal Uncle        d. 42  . Breast cancer Paternal Aunt     The patient has one son who is cancer free.  She has a maternal half brother and paternal half sister who are cancer free.    The patient's mother is living.  She had three brothers, one who had pancreatic cancer and died at 7.  Her parents died of non-cancer related issues.  The patient's father died of colon cancer.  The patient did not grow up with her biological father, so she does not know how many total siblings he had, but does  know that three brothers had colon cancer and died and two sisters had breast cancer.  One of the sisters had a daughter with breast cancer.  His mother had both breast and colon cancer.  Jessica Barnes is unaware of previous family history of genetic testing for hereditary cancer risks. Patient's maternal ancestors are of Caucasian descent, and paternal ancestors are of welsh descent. There is no reported Ashkenazi [place]  ancestry. There is no known consanguinity.    GENETIC COUNSELING ASSESSMENT: Jessica Barnes is a 59 y.o. female with a personal and family history of breast cancer and a family history of colon cancer which is somewhat suggestive of a hereditary cancer syndrome and predisposition to cancer given the number of people in the family with breast and colon cancer. We, therefore, discussed and recommended the following at today's visit.   DISCUSSION: We discussed that 5 - 10% of breast cancer is hereditary, with most cases associated with BRCA mutations.  There are other genes that can be associated with hereditary breast cancer syndromes, some are also associated with colon cancer.  These include Lynch syndrome genes and CHEK2 most commonly.  We discussed that testing is beneficial for several reasons including knowing how to follow individuals after completing their treatment, identifying whether potential treatment options such as PARP inhibitors would be beneficial, and understand if other family members could be at risk for cancer and allow them to undergo genetic testing.   We reviewed the characteristics, features and inheritance patterns of hereditary cancer syndromes. We also discussed genetic testing, including the appropriate family members to test, the process of testing, insurance coverage and turn-around-time for results. We discussed the implications of a negative, positive and/or variant of uncertain significant result. In order to get genetic test results in a timely manner so  that Jessica Barnes can use these genetic test results for surgical decisions, we recommended Jessica Barnes pursue genetic testing for the 9-gene STAT panel. Once complete, we recommend Jessica Barnes pursue reflex genetic testing to the common hereditary cancer gene panel. The Common Hereditary Gene Panel offered by [facility] includes sequencing and/or deletion duplication testing of the following 48 genes: APC, ATM, AXIN2, BARD1, BMPR1A, BRCA1, BRCA2, BRIP1, CDH1, CDK4, CDKN2A (p14ARF), CDKN2A (p16INK4a), CHEK2, CTNNA1, DICER1, EPCAM (Deletion/duplication testing only), GREM1 (promoter region deletion/duplication testing only), KIT, MEN1, MLH1, MSH2, MSH3, MSH6, MUTYH, NBN, NF1, NHTL1, PALB2, PDGFRA, PMS2, POLD1, POLE, PTEN, RAD50, RAD51C, RAD51D, RNF43, SDHB, SDHC, SDHD, SMAD4, SMARCA4. STK11, TP53, TSC1, TSC2, and VHL.  The following genes were evaluated for sequence changes only: SDHA and HOXB13 c.251G>A variant only.   Based on Jessica Barnes's personal and family history of cancer, she meets medical criteria for genetic testing. Despite that she meets criteria, she may still have an out of pocket cost.   PLAN: After considering the risks, benefits, and limitations, Jessica Barnes provided informed consent to pursue genetic testing.  A blood draw was scheduled for September 03, 2019 at [facility] and the blood sample will be sent to [facility] for analysis of the STAT and common hereditary cancer panel. Results should be available within approximately 2-3 weeks' time, at which point they will be disclosed by telephone to Jessica Barnes, as will any additional recommendations warranted by these results. Jessica Barnes will receive a summary of her genetic counseling visit and a copy of her results once available. This information will also be available in Epic.   Lastly, we encouraged Jessica Barnes to remain in contact with cancer genetics annually so that we can continuously update  the family history and inform her of any changes in cancer genetics and testing that may be of benefit for this family.   Jessica Barnes questions were answered to her satisfaction today. Our contact information was provided should additional questions or concerns arise. Thank you for the referral and allowing [place] to share in the care of your patient.   Santiago Glad  Merton Border, [place], [facility] Licensed, Certified Genetic Counselor [email or web address] phone: [phone]  The patient was seen for a total of 50 minutes in face-to-face genetic counseling.  This patient was discussed with Drs. Magrinat, Lindi Adie and/or Burr Medico who agrees with the above.    _______________________________________________________________________ For Office Staff:  Number of people involved in session: 2 Was an Intern/ student involved with case: no

## 2019-09-03 ENCOUNTER — Other Ambulatory Visit: Payer: Self-pay

## 2019-09-03 ENCOUNTER — Telehealth: Payer: Self-pay | Admitting: Family Medicine

## 2019-09-03 ENCOUNTER — Inpatient Hospital Stay: Payer: No Typology Code available for payment source

## 2019-09-03 ENCOUNTER — Inpatient Hospital Stay: Payer: No Typology Code available for payment source | Attending: Internal Medicine | Admitting: Internal Medicine

## 2019-09-03 DIAGNOSIS — Z170 Estrogen receptor positive status [ER+]: Secondary | ICD-10-CM | POA: Diagnosis not present

## 2019-09-03 DIAGNOSIS — C50412 Malignant neoplasm of upper-outer quadrant of left female breast: Secondary | ICD-10-CM | POA: Diagnosis not present

## 2019-09-03 DIAGNOSIS — R748 Abnormal levels of other serum enzymes: Secondary | ICD-10-CM | POA: Diagnosis not present

## 2019-09-03 NOTE — Assessment & Plan Note (Addendum)
#  Left breast cancer -ER/PR positive HER-2-negative.  Staging pending.  MRI shows left breast superficial enhancement-awaiting ultrasound-guided biopsy on 1/22; also shows approximately 2 cm left breast mass as noted on the previous mammogram.  No lesions noted on the right breast.  Patient awaiting genetic testing [done on 1/19]-to confirm a surgical options.  #Elevated alkaline phosphatase-172; otherwise normal LFTs.  Bone scan-left rib uptake/T3 uptake-recommended further work-up with x-ray/MRI thoracic spine.  CT scan liver-2.5 cm area of hypoattenuation suggestive of focal fatty sparing-however recommended MRI for further evaluation.  Discussed the findings with the patient and husband detail.  Clinically still feel this is early stage cancer and concern for metastatic disease is quite small.  I would recommend repeating alkaline phosphatase/and if still elevated recommend imaging as discussed above.  Patient will have repeat labs on 1/22.   #Significant anxiety-interrupting daily life.  Patient currently on Valium 10 mg a day.  Also on Celexa 40 mg a day.  Discussed with patient's PCP.  I have asked the patient to reach out to PCPs office for further management.  # # DISPOSITION: # labs- on 1/22 around 11 or so [pt has appt at [facility] at 8:30] # follow up TBD- Dr.B

## 2019-09-03 NOTE — Progress Notes (Signed)
I connected with Jessica Barnes on 09/03/19 at  3:15 PM EST by video enabled telemedicine visit and verified that I am speaking with the correct person using two identifiers.  I discussed the limitations, risks, security and privacy concerns of performing an evaluation and management service by telemedicine and the availability of in-person appointments. I also discussed with the patient that there may be a patient responsible charge related to this service. The patient expressed understanding and agreed to proceed.    Other persons participating in the visit and their role in the encounter: RN/medical reconciliation Patient's location: home Provider's location: office  Oncology History Overview Note  # DEC 2020- LEFT BREAST- [place]; ER/PR >90%; Her 2-IHC-2+; FISH-pending.G-3; mammo/US- 1.9 x 1.6 x 2.3 cm lobular hypoechoic mass left breast 2 o'clock position   #   # SURVIVORSHIP:   DIAGNOSIS:   STAGE:         ;  GOALS:  CURRENT/MOST RECENT THERAPY :     Carcinoma of upper-outer quadrant of left breast in female, estrogen receptor positive ([place])  08/14/2019 Initial Diagnosis   Carcinoma of upper-outer quadrant of left breast in female, estrogen receptor positive ([place])      Chief Complaint: Breast cancer  History of present illness:Jessica Barnes 59 y.o.  female with recent diagnosis of breast cancer is here for follow-up reviewed the results of the imaging results.  In the interim patient was evaluated by surgery; also underwent MRI breasts.    Patient denies any worsening bone pain.  Any back pain.  No nausea no vomiting.   Patient complains of significant anxiety; difficulty sleeping at night.   Observation/objective: Bone scan/CT scan abdomen pelvis/MRI breast-see below  Assessment and plan: Carcinoma of upper-outer quadrant of left breast in female, estrogen receptor positive ([place]) #Left breast cancer -ER/PR positive HER-2-negative.  Staging pending.  MRI shows  left breast superficial enhancement-awaiting ultrasound-guided biopsy on 1/22; also shows approximately 2 cm left breast mass as noted on the previous mammogram.  No lesions noted on the right breast.  Patient awaiting genetic testing [done on 1/19]-to confirm a surgical options.  #Elevated alkaline phosphatase-172; otherwise normal LFTs.  Bone scan-left rib uptake/T3 uptake-recommended further work-up with x-ray/MRI thoracic spine.  CT scan liver-2.5 cm area of hypoattenuation suggestive of focal fatty sparing-however recommended MRI for further evaluation.  Discussed the findings with the patient and husband detail.  Clinically still feel this is early stage cancer and concern for metastatic disease is quite small.  I would recommend repeating alkaline phosphatase/and if still elevated recommend imaging as discussed above.  Patient will have repeat labs on 1/22.   #Significant anxiety-interrupting daily life.  Patient currently on Valium 10 mg a day.  Also on Celexa 40 mg a day.  Discussed with patient's PCP.  I have asked the patient to reach out to PCPs office for further management.  # # DISPOSITION: # labs- on 1/22 around 11 or so [pt has appt at [facility] at 8:30] # follow up TBD- Dr.B  Follow-up instructions:  I discussed the assessment and treatment plan with the patient.  The patient was provided an opportunity to ask questions and all were answered.  The patient agreed with the plan and demonstrated understanding of instructions.  The patient was advised to call back or seek an in person evaluation if the symptoms worsen or if the condition fails to improve as anticipated.  Dr. Charlaine Dalton Stratton at [facility] 09/03/2019 4:24 PM

## 2019-09-03 NOTE — Telephone Encounter (Signed)
I spoke with pt; pt said she is nervous most of the time with the cancer dx and all the testing that is still to be done. Pt said she was OK in the beginning but now it is real. Pt said it is a huge change knowing she has CA and oncologist called after bone scan and spot was found on liver. Pt may sleep 3 hrs a night. Pt said nothing is normal and pt is scared. Sometimes pt wakes up and is trembling. Pt does not want to feel this way and she knows this is just the beginning. Pt said no SI/HI. Pt hopes to get thru this until new antidepressant kicks in and knows a definite plan. Pt has valium and usually does not take them every day but since all of this pt does need the Valium. Pt is presently taking valium 10 mg one at hs and 1/2 during the day to take the edge off. Pt wants to know if valium can be increased. Pt has approx 29 valium refill today but pt is going to need more until can get thru this. [facility]. Pt request cb.

## 2019-09-03 NOTE — Telephone Encounter (Signed)
Please triage patient re: anxiety.  Recent cancer dx.  Thanks.

## 2019-09-05 ENCOUNTER — Encounter: Payer: Self-pay | Admitting: *Deleted

## 2019-09-05 MED ORDER — BUSPIRONE HCL 5 MG PO TABS: 5.0000 mg | ORAL_TABLET | Freq: Two times a day (BID) | ORAL | 2 refills | Status: DC

## 2019-09-05 NOTE — Telephone Encounter (Signed)
I get her point and I am sympathetic to her situation.  I would not change the Valium yet.  I would add on BuSpar 5 mg twice a day for now.  Have her take that preemptively and see if that helps.  After several days, if tolerated and if needed, increase to 10 mg twice a day.  Continue citalopram at baseline and then use Valium as needed.  See if that helps and please update me in about 1 week.  We can refill her Valium prescription later on.  Thanks.

## 2019-09-05 NOTE — Progress Notes (Signed)
Patients husband called.  Confirmed tomorrow appointment time.  Reviewed current  Plan of care as discussed with Dr. Bradd Canary this week.

## 2019-09-06 ENCOUNTER — Ambulatory Visit
Admission: RE | Admit: 2019-09-06 | Discharge: 2019-09-06 | Disposition: A | Discharge status: Home / Self Care | Payer: No Typology Code available for payment source | Source: Ambulatory Visit | Attending: General Surgery | Admitting: General Surgery

## 2019-09-06 ENCOUNTER — Other Ambulatory Visit: Payer: Self-pay

## 2019-09-06 ENCOUNTER — Other Ambulatory Visit: Payer: Self-pay | Admitting: Internal Medicine

## 2019-09-06 ENCOUNTER — Other Ambulatory Visit: Payer: Self-pay | Admitting: *Deleted

## 2019-09-06 ENCOUNTER — Inpatient Hospital Stay: Payer: No Typology Code available for payment source

## 2019-09-06 DIAGNOSIS — N6320 Unspecified lump in the left breast, unspecified quadrant: Secondary | ICD-10-CM

## 2019-09-06 DIAGNOSIS — R748 Abnormal levels of other serum enzymes: Secondary | ICD-10-CM

## 2019-09-06 DIAGNOSIS — C50412 Malignant neoplasm of upper-outer quadrant of left female breast: Secondary | ICD-10-CM

## 2019-09-06 DIAGNOSIS — N6459 Other signs and symptoms in breast: Secondary | ICD-10-CM

## 2019-09-06 DIAGNOSIS — Z170 Estrogen receptor positive status [ER+]: Secondary | ICD-10-CM

## 2019-09-06 HISTORY — PX: BREAST BIOPSY: SHX20

## 2019-09-06 LAB — HEPATIC FUNCTION PANEL
ALT: 18 U/L (ref 0–44)
AST: 27 U/L (ref 15–41)
Albumin: 3.8 g/dL (ref 3.5–5.0)
Alkaline Phosphatase: 174 U/L — ABNORMAL HIGH (ref 38–126)
Bilirubin, Direct: <0.1 mg/dL (ref 0.0–0.2)
Total Bilirubin: 0.4 mg/dL (ref 0.3–1.2)
Total Protein: 7.6 g/dL (ref 6.5–8.1)

## 2019-09-06 LAB — GAMMA GT: GGT: 179 U/L — ABNORMAL HIGH (ref 7–50)

## 2019-09-06 NOTE — Telephone Encounter (Signed)
Advised pt of PCP msg and educated pt on medication. Verified med when to pts chosen pharmacy. Advised to f/u in a week for an update or sooner if needed. Pt verbalized understanding.

## 2019-09-09 ENCOUNTER — Telehealth: Payer: Self-pay | Admitting: Internal Medicine

## 2019-09-09 LAB — ALKALINE PHOSPHATASE, ISOENZYMES
Alk Phos Bone Fract: 22 % (ref 14–68)
Alk Phos Liver Fract: 78 % (ref 18–85)
Alk Phos: 206 IU/L — ABNORMAL HIGH (ref 39–117)
Intestinal %: 0 % (ref 0–18)

## 2019-09-09 NOTE — Telephone Encounter (Signed)
On 1/22-discussed the results of the repeated alkaline phosphatase elevated; however GGT elevated-suggestive of liver origin.  Await alkaline phosphatase fractionation; however s/p breast biopsy on 1/22.  Also awaiting genetic testing results.  Will discuss with GI regarding further work-up/GI referral.  However this should not hold up the surgical plans.   ----------------------------------------------------------------  FYI-Dr.Wakefield

## 2019-09-10 ENCOUNTER — Other Ambulatory Visit: Payer: Self-pay | Admitting: Anatomic Pathology & Clinical Pathology

## 2019-09-10 ENCOUNTER — Telehealth: Payer: Self-pay | Admitting: Internal Medicine

## 2019-09-10 ENCOUNTER — Other Ambulatory Visit: Payer: Self-pay | Admitting: *Deleted

## 2019-09-10 DIAGNOSIS — Z170 Estrogen receptor positive status [ER+]: Secondary | ICD-10-CM

## 2019-09-10 DIAGNOSIS — R748 Abnormal levels of other serum enzymes: Secondary | ICD-10-CM

## 2019-09-10 DIAGNOSIS — R937 Abnormal findings on diagnostic imaging of other parts of musculoskeletal system: Secondary | ICD-10-CM

## 2019-09-10 DIAGNOSIS — K769 Liver disease, unspecified: Secondary | ICD-10-CM

## 2019-09-10 DIAGNOSIS — C50412 Malignant neoplasm of upper-outer quadrant of left female breast: Secondary | ICD-10-CM

## 2019-09-10 NOTE — Telephone Encounter (Signed)
Referral for Dr. Vicente Males entered in epic.

## 2019-09-10 NOTE — Telephone Encounter (Signed)
Spoke to patient husband regarding results of the blood work-alkaline phosphatase-likely from liver.  However recommend MRI-liver/thoracic spine for further delineation.  Discussed regarding referral to GI.  In agreement.  Discussed stating that imaging/work-up for alkaline phosphatase should not hold off her immediate surgical plans at this time.   Discussed with Dr. Parke Simmers kindly agrees to evaluate the patient given her elevated alkaline phosphatase.   C- Please schedule MRI ASAP/ referral to Dr.Anna.  Thanks  GB

## 2019-09-11 ENCOUNTER — Other Ambulatory Visit: Payer: Self-pay | Admitting: General Surgery

## 2019-09-11 ENCOUNTER — Encounter: Payer: Self-pay | Admitting: Genetic Counselor

## 2019-09-11 ENCOUNTER — Ambulatory Visit: Payer: Self-pay | Admitting: Genetic Counselor

## 2019-09-11 ENCOUNTER — Telehealth: Payer: Self-pay | Admitting: Genetic Counselor

## 2019-09-11 DIAGNOSIS — Z1379 Encounter for other screening for genetic and chromosomal anomalies: Secondary | ICD-10-CM

## 2019-09-11 DIAGNOSIS — Z170 Estrogen receptor positive status [ER+]: Secondary | ICD-10-CM

## 2019-09-11 DIAGNOSIS — C50412 Malignant neoplasm of upper-outer quadrant of left female breast: Secondary | ICD-10-CM

## 2019-09-11 NOTE — Progress Notes (Signed)
HPI:  [name] was previously seen in the [facility] due to a personal and family history of cancer and concerns regarding a hereditary predisposition to cancer. Please refer to our prior cancer genetics clinic note for more information regarding our discussion, assessment and recommendations, at the time. [name] recent genetic test results were disclosed to her, as were recommendations warranted by these results. These results and recommendations are discussed in more detail below.  CANCER HISTORY:  Oncology History Overview Note  # DEC 2020- LEFT BREAST- [place]; ER/PR >90%; Her 2-IHC-2+; FISH-pending.G-3; mammo/US- 1.9 x 1.6 x 2.3 cm lobular hypoechoic mass left breast 2 o'clock position   #   # SURVIVORSHIP:   DIAGNOSIS:   STAGE:         ;  GOALS:  CURRENT/MOST RECENT THERAPY :     Carcinoma of upper-outer quadrant of left breast in female, estrogen receptor positive ([place])  08/14/2019 Initial Diagnosis   Carcinoma of upper-outer quadrant of left breast in female, estrogen receptor positive ([place])   09/10/2019 Genetic Testing   Negative genetic testing on the STAT and common hereditary cancer panel.  The Common Hereditary Gene Panel offered by [facility] includes sequencing and/or deletion duplication testing of the following 48 genes: APC, ATM, AXIN2, BARD1, BMPR1A, BRCA1, BRCA2, BRIP1, CDH1, CDK4, CDKN2A (p14ARF), CDKN2A (p16INK4a), CHEK2, CTNNA1, DICER1, EPCAM (Deletion/duplication testing only), GREM1 (promoter region deletion/duplication testing only), KIT, MEN1, MLH1, MSH2, MSH3, MSH6, MUTYH, NBN, NF1, NHTL1, PALB2, PDGFRA, PMS2, POLD1, POLE, PTEN, RAD50, RAD51C, RAD51D, RNF43, SDHB, SDHC, SDHD, SMAD4, SMARCA4. STK11, TP53, TSC1, TSC2, and VHL.  The following genes were evaluated for sequence changes only: SDHA and HOXB13 c.251G>A variant only. The report date is September 10, 2019.     FAMILY HISTORY:  We obtained a detailed, 4-generation family  history.  Significant diagnoses are listed below: Family History  Problem Relation Age of Onset  . Hypertension Mother   . Colon cancer Father        d. 23  . Thyroid disease Sister   . Breast cancer Paternal Aunt   . Breast cancer Paternal Grandmother   . Colon cancer Paternal Grandmother   . Breast cancer Cousin        paternal aunt's daughter  . Colon cancer Paternal Uncle        3 pat uncles with colon cancer at unknown ages  . Pancreatic cancer Maternal Uncle        d. 87  . Breast cancer Paternal Aunt     The patient has one son who is cancer free.  She has a maternal half brother and paternal half sister who are cancer free.    The patient's mother is living.  She had three brothers, one who had pancreatic cancer and died at 53.  Her parents died of non-cancer related issues.  The patient's father died of colon cancer.  The patient did not grow up with her biological father, so she does not know how many total siblings he had, but does know that three brothers had colon cancer and died and two sisters had breast cancer.  One of the sisters had a daughter with breast cancer.  His mother had both breast and colon cancer.  [name] is unaware of previous family history of genetic testing for hereditary cancer risks. Patient's maternal ancestors are of Caucasian descent, and paternal ancestors are of welsh descent. There is no reported Ashkenazi Jewish ancestry. There is no known consanguinity.  GENETIC TEST RESULTS: Genetic testing reported out on September 10, 2019 through the common hereditary cancer panel found no pathogenic mutations. The Common Hereditary Gene Panel offered by [facility] includes sequencing and/or deletion duplication testing of the following 48 genes: APC, ATM, AXIN2, BARD1, BMPR1A, BRCA1, BRCA2, BRIP1, CDH1, CDK4, CDKN2A (p14ARF), CDKN2A (p16INK4a), CHEK2, CTNNA1, DICER1, EPCAM (Deletion/duplication testing only), GREM1 (promoter region  deletion/duplication testing only), KIT, MEN1, MLH1, MSH2, MSH3, MSH6, MUTYH, NBN, NF1, NHTL1, PALB2, PDGFRA, PMS2, POLD1, POLE, PTEN, RAD50, RAD51C, RAD51D, RNF43, SDHB, SDHC, SDHD, SMAD4, SMARCA4. STK11, TP53, TSC1, TSC2, and VHL.  The following genes were evaluated for sequence changes only: SDHA and HOXB13 c.251G>A variant only. The test report has been scanned into EP[employer] and is located under the Molecular Pathology section of the Results Review tab.  A portion of the result report is included below for reference.     We discussed with Ms. Camero that because current genetic testing is not perfect, it is possible there may be a gene mutation in one of these genes that current testing cannot detect, but that chance is small.  We also discussed, that there could be another gene that has not yet been discovered, or that we have not yet tested, that is responsible for the cancer diagnoses in the family. It is also possible there is a hereditary cause for the cancer in the family that Ms. Wolfert did not inherit and therefore was not identified in her testing.  Therefore, it is important to remain in touch with cancer genetics in the future so that we can continue to offer [name] the most up to date genetic testing.   ADDITIONAL GENETIC TESTING: We discussed with [name] that there are other genes that are associated with increased cancer risk that can be analyzed. Should [name] wish to pursue additional genetic testing, we are happy to discuss and coordinate this testing, at any time.    CANCER SCREENING RECOMMENDATIONS: [name] test result is considered negative (normal).  This means that we have not identified a hereditary cause for her personal and family history of cancer at this time. Most cancers happen by chance and this negative test suggests that her cancer may fall into this category.    While reassuring, this does not definitively rule out a hereditary  predisposition to cancer. It is still possible that there could be genetic mutations that are undetectable by current technology. There could be genetic mutations in genes that have not been tested or identified to increase cancer risk.  Therefore, it is recommended she continue to follow the cancer management and screening guidelines provided by her oncology and primary healthcare provider.   An individual's cancer risk and medical management are not determined by genetic test results alone. Overall cancer risk assessment incorporates additional factors, including personal medical history, family history, and any available genetic information that may result in a personalized plan for cancer prevention and surveillance  RECOMMENDATIONS FOR FAMILY MEMBERS:  Individuals in this family might be at some increased risk of developing cancer, over the general population risk, simply due to the family history of cancer.  We recommended women in this family have a yearly mammogram beginning at age 73, or 64 years younger than the earliest onset of cancer, an annual clinical breast exam, and perform monthly breast self-exams. Women in this family should also have a gynecological exam as recommended by their primary provider. All family members should have a colonoscopy by age 48.  FOLLOW-UP: Lastly,  we discussed with Ms. Low that cancer genetics is a rapidly advancing field and it is possible that new genetic tests will be appropriate for her and/or her family members in the future. We encouraged her to remain in contact with cancer genetics on an annual basis so we can update her personal and family histories and let her know of advances in cancer genetics that may benefit this family.   Our contact number was provided. Ms. Schriner questions were answered to her satisfaction, and she knows she is welcome to call us at anytime with additional questions or concerns.   Roma Kayser, [place], [facility] Licensed,  Certified Genetic Counselor [email or web address]

## 2019-09-11 NOTE — Telephone Encounter (Signed)
Revealed negative genetic testing.  Discussed that we do not know why she has breast cancer or why there is cancer in the family. It could be due to a different gene that we are not testing, or maybe our current technology may not be able to pick something up.  It will be important for her to keep in contact with genetics to keep up with whether additional testing may be needed. 

## 2019-09-12 ENCOUNTER — Telehealth: Payer: Self-pay | Admitting: Internal Medicine

## 2019-09-12 NOTE — Telephone Encounter (Signed)
On 1/27-spoke to patient husband regarding the results of the recent repeat breast biopsy positive for malignancy [as per pathology appears morphologically distinct-awaiting breast panel.].  Discussed with the patient husband that this should not change any upfront surgical management at this time.  Patient awaiting appointment with Dr. Donne Hazel.  Patient awaiting evaluation with GI regarding elevated GGT/also awaiting MRI liver and spine-again should not hold up surgical plans.  As per discussion with Dr.Wakefield-plan lumpectomy with sentinel lymph node biopsy.   Sheena/Anne-please make sure patient has an appointment with me 7 to 10 days post surgery.   FYI-Dr.Wakefield.

## 2019-09-13 ENCOUNTER — Telehealth: Payer: Self-pay

## 2019-09-13 ENCOUNTER — Ambulatory Visit (INDEPENDENT_AMBULATORY_CARE_PROVIDER_SITE_OTHER): Payer: No Typology Code available for payment source | Admitting: Gastroenterology

## 2019-09-13 ENCOUNTER — Encounter: Payer: Self-pay | Admitting: Gastroenterology

## 2019-09-13 DIAGNOSIS — R748 Abnormal levels of other serum enzymes: Secondary | ICD-10-CM | POA: Diagnosis not present

## 2019-09-13 NOTE — Progress Notes (Signed)
[name]  [address]  [place]  [place], [place] [address]  Main: [phone]  Fax: [phone]   Gastroenterology Consultation Referring Provider: Dr. Rogue Bussing  primary Care Physician:  Tonia Ghent, MD Primary Gastroenterologist:  Dr. [name]  Reason for Consultation:     Elevated Alkaline phosphotase         HPI:   Virtual Visit via video  Note  I connected with patient on 09/13/19 at 10:30 AM EST by video  and verified that I am speaking with the correct person using two identifiers.   I discussed the limitations, risks, security and privacy concerns of performing an evaluation and management service by video and the availability of in person appointments. I also discussed with the patient that there may be a patient responsible charge related to this service. The patient expressed understanding and agreed to proceed.  Location of the patient: Home Location of provider: Home Participating persons: Patient and provider only   History of Present Illness: Chief Complaint  Patient presents with  . New Patient (Initial Visit)  . abdnornal lab    is having no symptoms . Patient does have IBS sometimes            HPI:   Jessica Barnes is a 59 y.o. y/o female referred for elevated alkaline phosphatase by Dr. Rogue Bussing. She sees him at the cancer center for breast cancer.  Alkaline phosphatase was completely normal 1 year back but when recently checked about a month back was 171 with normal AST and ALT and total bilirubin.  INR was 0.9.  CBC demonstrated a hemoglobin of 12.3 g with a platelet count of 377 alkaline phosphatase isoenzymes were fractionated and were within normal limits as the individual components but elevated in total.  Gamma GT was elevated at 179.  CT scan of the abdomen pelvis in January 2021 demonstrated no metastatic disease in the abdomen.  Possible focal steatosis was noted.  Also mentioned that if there is a strong concern for  hepatic metastatic pre and postcontrast abdominal MRI could confirm this.  No family history or personal history of liver disease.No history of excess alcohol consumption. No illegal drug use or blood transfusion, no Armed forces logistics/support/administrative officer.   Started Celexa very recently. Primary care doctor Buspar since 1 week. No herbal medication or dietary supplements.   Takes Tyelnol for heahaches sometimes upto 9 tablets a day in addition to advil . She has seen neurologists for the same.   Denies any weight gain.    Past Medical History:  Diagnosis Date  . Anxiety 10/13/96  . Chest pain    atypical, hosp for that and depression 8/10-8/11/08  . COPD (chronic obstructive pulmonary disease) ([facility]) 09/15/05   by x-ray  . Depression 10/13/96   hospital 8/10-8/11/08.  notable history: son with TBI after MVA  . Family history of breast cancer   . Family history of colon cancer   . Family history of pancreatic cancer   . Hyperlipidemia 09/15/98  . Hypothyroidism 01/14/96  . Migraine    27 yoa  . NSVD (normal spontaneous vaginal delivery)    x 1  . PTSD (post-traumatic stress disorder)    likely PTSD after son's MVA/TBI in 2007, s/p counseling    Past Surgical History:  Procedure Laterality Date  . APPENDECTOMY     59 years of age  . BREAST BIOPSY Left 08/06/2019   [place] bx/ ribbon clip/ path pending  . BREAST BIOPSY Left  09/06/2019   [place] path pending heart clip    Prior to Admission medications   Medication Sig Start Date End Date Taking? Authorizing Provider  acetaminophen (TYLENOL) 500 MG tablet Take 1,000 mg by mouth every 6 (six) hours as needed for mild pain or headache.   Yes [provider]  busPIRone (BUSPAR) 5 MG tablet Take 1-2 tablets (5-10 mg total) by mouth 2 (two) times daily. 09/05/19  Yes Tonia Ghent, MD  butalbital-acetaminophen-caffeine (FIORICET) [phone] MG tablet TAKE 1 TABLET BY MOUTH EVERY 4 TO 6 HOURS AS NEEDED FOR HEADACHE 07/23/19  Yes Tonia Ghent, MD  citalopram  (CELEXA) 40 MG tablet Take 1 tablet (40 mg total) by mouth daily. 08/14/19  Yes Tonia Ghent, MD  diazepam (VALIUM) 10 MG tablet Take 1 tablet (10 mg total) by mouth at bedtime as needed (and 1/2 tab in the day as needed for anxiety). for sleep 08/14/19  Yes Tonia Ghent, MD  dimenhyDRINATE (DRAMAMINE) 50 MG tablet Take 50 mg by mouth every 8 (eight) hours as needed.   Yes [provider]  levothyroxine (SYNTHROID) 88 MCG tablet TAKE 1 TABLET BY MOUTH DAILY EXCEPT ON SUNDAYS TAKE 1/2 TABLET 08/05/19  Yes Tonia Ghent, MD  loperamide (IMODIUM A-D) 2 MG tablet Take 4 mg by mouth 4 (four) times daily as needed for diarrhea or loose stools.   Yes [provider]    Family History  Problem Relation Age of Onset  . Hypertension Mother   . Colon cancer Father        d. 23  . Thyroid disease Sister   . Breast cancer Paternal Aunt   . Breast cancer Paternal Grandmother   . Colon cancer Paternal Grandmother   . Breast cancer Cousin        paternal aunt's daughter  . Colon cancer Paternal Uncle        3 pat uncles with colon cancer at unknown ages  . Pancreatic cancer Maternal Uncle        d. 39  . Breast cancer Paternal Aunt      Social History   Tobacco Use  . Smoking status: Former Smoker    Packs/day: 0.50    Years: 8.00    Pack years: 4.00    Types: Cigarettes  . Smokeless tobacco: Never Used  . Tobacco comment: quit 04/15/2014  Substance Use Topics  . Alcohol use: No    Alcohol/week: 0.0 standard drinks  . Drug use: No    Allergies as of 09/13/2019 - Review Complete 09/13/2019  Allergen Reaction Noted  . Sulfamethoxazole-trimethoprim  05/18/2009  . Codeine  03/29/2007    Review of Systems:    All systems reviewed and negative except where noted in HPI. General Appearance:    Alert, cooperative, no distress, appears stated age  Head:    Normocephalic, without obvious abnormality, atraumatic  Eyes:    PERRL, conjunctiva/corneas clear,  Ears:     Grossly normal hearing    Neurologic:   Grossly appears normal     Observations/Objective:  Labs: CBC    Component Value Date/Time   WBC 7.3 08/14/2019 1544   RBC 4.12 08/14/2019 1544   HGB 12.3 08/14/2019 1544   HCT 38.6 08/14/2019 1544   PLT 377 08/14/2019 1544   MCV 93.7 08/14/2019 1544   MCH 29.9 08/14/2019 1544   MCHC 31.9 08/14/2019 1544   RDW 12.3 08/14/2019 1544   LYMPHSABS 1.7 08/14/2019 1544   MONOABS 0.6 08/14/2019  1544   EOSABS 0.2 08/14/2019 1544   BASOSABS 0.1 08/14/2019 1544   CMP     Component Value Date/Time   NA 138 08/14/2019 1544   K 3.4 (L) 08/14/2019 1544   CL 103 08/14/2019 1544   CO2 25 08/14/2019 1544   GLUCOSE 101 (H) 08/14/2019 1544   BUN 11 08/14/2019 1544   CREATININE 0.86 08/14/2019 1544   CALCIUM 8.8 (L) 08/14/2019 1544   PROT 7.6 09/06/2019 1054   ALBUMIN 3.8 09/06/2019 1054   AST 27 09/06/2019 1054   ALT 18 09/06/2019 1054   ALKPHOS 174 (H) 09/06/2019 1054   BILITOT 0.4 09/06/2019 1054   GFRNONAA >60 08/14/2019 1544   GFRAA >60 08/14/2019 1544    Imaging Studies: NM Bone Scan Whole Body  Result Date: 08/29/2019 CLINICAL DATA:  LEFT breast cancer, elevated alkaline phosphatase EXAM: NUCLEAR MEDICINE WHOLE BODY BONE SCAN TECHNIQUE: Whole body anterior and posterior images were obtained approximately 3 hours after intravenous injection of radiopharmaceutical. RADIOPHARMACEUTICALS:  22.08 mCi Technetium-69mMDP IV COMPARISON:  None Radiographic correlation: None FINDINGS: Mild uptake at the shoulders, sternoclavicular joints, hips, knees, and laterally in the cervical spine bilaterally, pattern typically degenerative. Additional abnormal tracer uptake is seen at the posterior LEFT fifth rib and at the T3 vertebral body, nonspecific, metastatic disease not excluded. Abnormal uptake is also identified within the RIGHT mandible, typically related to dental disease though metastatic lesion is not excluded. No additional sites of worrisome  osseous tracer uptake. Expected urinary tract and soft tissue distribution of tracer. IMPRESSION: Scattered degenerative type uptake with additional nonspecific increased tracer accumulation at the posterior LEFT fifth rib and the T3 vertebral body; recommend radiographic correlation of the LEFT ribs and for MR imaging of the thoracic spine for further assessment. Nonspecific uptake RIGHT mandible as discussed above. Electronically Signed   By: MLavonia DanaM.D.   On: 08/29/2019 08:18   CT ABDOMEN PELVIS W CONTRAST  Result Date: 08/30/2019 CLINICAL DATA:  Elevated liver function tests. Recently diagnosed with left-sided breast cancer. EXAM: CT ABDOMEN AND PELVIS WITH CONTRAST TECHNIQUE: Multidetector CT imaging of the abdomen and pelvis was performed using the standard protocol following bolus administration of intravenous contrast. CONTRAST:  765mOMNIPAQUE IOHEXOL 300 MG/ML  SOLN COMPARISON:  None. FINDINGS: Lower chest: Clear lung bases. Normal heart size without pericardial or pleural effusion. Hepatobiliary: Focal steatosis adjacent the falciform ligament. There is also hypoattenuation within the pericholecystic posterior aspect of segment 4A, including at maximally 2.5 cm on 26/2. Normal gallbladder, without biliary ductal dilatation. Pancreas: Mild fatty replacement involving the pancreatic head. No duct dilatation or acute inflammation. Spleen: Normal in size, without focal abnormality. Adrenals/Urinary Tract: Normal adrenal glands. Normal kidneys, without hydronephrosis. Normal urinary bladder. Stomach/Bowel: Normal stomach, without wall thickening. Normal colon and terminal ileum. Normal small bowel. Vascular/Lymphatic: Aortic atherosclerosis. No abdominopelvic adenopathy. Reproductive: Normal uterus and adnexa. Other: No significant free fluid. No evidence of omental or peritoneal disease. Musculoskeletal: No acute osseous abnormality. IMPRESSION: 1. No acute process or evidence of metastatic disease  in the abdomen or pelvis. 2. Hypoattenuation within the posterior aspect of segment 4A, strongly favored to represent focal steatosis. If there is a strong clinical concern of hepatic metastasis (i.e. High-grade or aggressive primary) pre and post contrast abdominal MRI could confirm this as a benign finding. 3.  Aortic Atherosclerosis (ICD10-I70.0). 4. Please note that the bone scan abnormalities were not included on this abdominopelvic CT. Electronically Signed   By: KyAdria Devon.  On: 08/30/2019 08:31   MR BREAST BILATERAL W WO CONTRAST INC CAD  Result Date: 08/30/2019 CLINICAL DATA:  59 year old female with newly diagnosed invasive LEFT breast cancer. LABS:  None performed today EXAM: BILATERAL BREAST MRI WITH AND WITHOUT CONTRAST TECHNIQUE: Multiplanar, multisequence MR images of both breasts were obtained prior to and following the intravenous administration of 6 ml of Gadavist Three-dimensional MR images were rendered by post-processing of the original MR data on an independent workstation. The three-dimensional MR images were interpreted, and findings are reported in the following complete MRI report for this study. Three dimensional images were evaluated at the independent DynaCad workstation COMPARISON:  Prior mammogram and ultrasound FINDINGS: Breast composition: b. Scattered fibroglandular tissue. Background parenchymal enhancement: Mild Right breast: No mass or abnormal enhancement. Left breast: A 2.2 x 2.4 x 2.6 cm (transverse x AP x CC) irregular enhancing mass within the UPPER-OUTER LEFT breast, mid-posterior depth, is identified, containing biopsy clip artifact and compatible with biopsy-proven malignancy. There is UPPER-OUTER LEFT breast skin retraction and nipple retraction noted. An indeterminate 0.6 x 0.7 cm mildly enhancing oval mass within the anterior breast immediately underlying the UPPER OUTER skin retraction is identified with persistent kinetics (series 10: Image 45). This mass  lies 1.5 cm anteroinferior to the biopsy-proven malignancy. Lymph nodes: No abnormal appearing lymph nodes. Ancillary findings:  None. IMPRESSION: 1. 2.2 x 2.4 x 2.6 cm biopsy-proven malignancy within the UPPER-OUTER LEFT breast with skin and nipple retraction noted. 2. Indeterminate 0.7 cm mildly enhancing oval mass within the anterior UPPER-OUTER LEFT breast, immediately underlying skin retraction and lies 1.5 cm anteroinferior to the biopsy-proven malignancy. Recommend 2nd-look ultrasound with possible biopsy. 3. No MR evidence of abnormal lymph nodes or RIGHT breast malignancy. RECOMMENDATION: 1. 2nd-look LEFT breast ultrasound and possible biopsy of 0.7 cm oval mass within the anterior UPPER-OUTER LEFT breast immediately underlying skin retraction. If this area is not identified sonographically, consider MR guided biopsy if breast conservation is desired. BI-RADS CATEGORY  4: Suspicious. Electronically Signed   By: Margarette Canada M.D.   On: 08/30/2019 12:27   US BREAST LTD UNI LEFT INC AXILLA  Result Date: 09/06/2019 CLINICAL DATA:  Recent diagnosis of left breast cancer. A second mass was found on MRI of the breast in the left breast for which second-look ultrasound was recommended. EXAM: ULTRASOUND OF THE left BREAST COMPARISON:  Previous exam(s). FINDINGS: Targeted ultrasound is performed, showing a 7 mm irregular hypoechoic lesion subjacent to the skin at the left breast 2 o'clock subareolar region. This likely correlates to the second mass found on the MRI of the breast. IMPRESSION: Suspicious findings. RECOMMENDATION: Ultrasound-guided core biopsy. I have discussed the findings and recommendations with the patient. If applicable, a reminder letter will be sent to the patient regarding the next appointment. BI-RADS CATEGORY  4: Suspicious. Electronically Signed   By: Abelardo Diesel M.D.   On: 09/06/2019 09:35   MM CLIP PLACEMENT LEFT  Result Date: 09/06/2019 CLINICAL DATA:  Status post  ultrasound-guided core biopsy left breast mass 2 o'clock retroareolar region. EXAM: DIAGNOSTIC left MAMMOGRAM POST ultrasound BIOPSY COMPARISON:  Previous exam(s). FINDINGS: Mammographic images were obtained following left breast ultrasound guided biopsy of mass at 2 o'clock subareolar region. The biopsy marking clip is in expected position at the site of biopsy. IMPRESSION: Appropriate positioning of the heart shaped biopsy marking clip at the site of biopsy in the mass of concern left breast 2 o'clock subareolar region. Final Assessment: Post Procedure Mammograms for Marker Placement Electronically  Signed   By: Abelardo Diesel M.D.   On: 09/06/2019 10:47   [place] LT BREAST BX W LOC DEV 1ST LESION IMG BX SPEC US GUIDE  Addendum Date: 09/11/2019   ADDENDUM REPORT: 09/11/2019 11:00 ADDENDUM: PATHOLOGY revealed: A. BREAST, LEFT AT 2:00, RETROAREOLAR; ULTRASOUND-GUIDED CORE BIOPSY: - INVASIVE MAMMARY CARCINOMA, WITH TUBULAR FEATURES. 5 mm in this sample. Grade 1. Ductal carcinoma in situ: Not identified. Lymphovascular invasion: Not identified. Pathology results are CONCORDANT with imaging findings, per Dr. Abelardo Diesel. Pathology results and recommendations below were discussed with patient by telephone on 09/10/2019. Patient reported biopsy site doing well with slight tenderness at the site. Post biopsy care instructions were reviewed and questions were answered. Patient was instructed to call [facility] if any concerns or questions arise related to the biopsy. Recommendation: Surgical referral. Request for surgical referral was relayed to nurse navigators at [facility] by Electa Sniff RN on 09/10/2019. I spoke with Elmo Putt (Dr. Georgia Dom nurse) on 09/11/2019; she will arrange appointment date and time, and contact patient today or tomorrow with information. Addendum by Electa Sniff RN on 09/11/2019. Electronically Signed   By: Abelardo Diesel M.D.   On: 09/11/2019 11:00   Result Date:  09/11/2019 CLINICAL DATA:  Mass at left breast 2 o'clock subareolar region for biopsy. EXAM: ULTRASOUND GUIDED left BREAST CORE NEEDLE BIOPSY COMPARISON:  Previous exam(s). FINDINGS: I met with the patient and we discussed the procedure of ultrasound-guided biopsy, including benefits and alternatives. We discussed the high likelihood of a successful procedure. We discussed the risks of the procedure, including infection, bleeding, tissue injury, clip migration, and inadequate sampling. Informed written consent was given. The usual time-out protocol was performed immediately prior to the procedure. Lesion quadrant: Upper-outer quadrant Using sterile technique and 1% Lidocaine as local anesthetic, under direct ultrasound visualization, a 14 gauge spring-loaded device was used to perform biopsy of mass at left breast 2 o'clock subareolar region using a lateral approach. At the conclusion of the procedure heart tissue marker clip was deployed into the biopsy cavity. Follow up 2 view mammogram was performed and dictated separately. IMPRESSION: Ultrasound guided biopsy of left breast.  No apparent complications. Electronically Signed: By: Abelardo Diesel M.D. On: 09/06/2019 09:36    Assessment and Plan:   ZAHARAH AMIR is a 59 y.o. y/o female has been referred for elevated alkaline phosphatase levels which are isolated.  Normal transaminases.  This was completely normal about a year back.  Gamma GT is elevated suggesting that it is of liver etiology.  She has never had a full liver work-up for autoimmune and viral hepatitis.  Recent CT scan of the abdomen shows hepatic steatosis but the radiologist has mentioned that if we need to rule out metastasis for sure would require an MRI.There are a few spots on her ribs that have lit up on the Bone scan which may also contribute to the elevated Alk phos     Plan :   1. Obtain full liver work-up for autoimmune and genetic disorders. 2. Await MRI of the liver which  has been ordered on 09/19/2019. 3. Cut down on Tyelenol/Advil to no more than 3 tablets a day   Office  visit in 2-3 weeks to discuss the results and next steps    I discussed the assessment and treatment plan with the patient. The patient was provided an opportunity to ask questions and all were answered. The patient agreed with the plan and demonstrated an understanding  of the instructions.   The patient was advised to call back or seek an in-person evaluation if the symptoms worsen or if the condition fails to improve as anticipated.  I provided 15 minutes of face-to-face time during this encounter.   Dr [name] MD,MRCP [facility]) Gastroenterology/Hepatology Pager: [phone]   Speech recognition software was used to dictate the above note.

## 2019-09-13 NOTE — Telephone Encounter (Signed)
Called pt to pre-chart for today's e-visit with Dr. Anna  Unable to contact. LVM to return call 

## 2019-09-16 ENCOUNTER — Other Ambulatory Visit: Payer: Self-pay | Admitting: General Surgery

## 2019-09-16 ENCOUNTER — Other Ambulatory Visit: Payer: Self-pay

## 2019-09-16 DIAGNOSIS — Z170 Estrogen receptor positive status [ER+]: Secondary | ICD-10-CM

## 2019-09-16 DIAGNOSIS — C50412 Malignant neoplasm of upper-outer quadrant of left female breast: Secondary | ICD-10-CM

## 2019-09-17 ENCOUNTER — Other Ambulatory Visit: Payer: No Typology Code available for payment source

## 2019-09-18 ENCOUNTER — Encounter: Payer: Self-pay | Admitting: *Deleted

## 2019-09-18 NOTE — Progress Notes (Signed)
Called patient to follow up on her surgical appointment.  She is scheduled for surgery on 10/01/19.  Requested follow up appointment with Dr. Rogue Bussing 7-10 days later.  Talked for a long time.  Offered support.  No needs at this time.

## 2019-09-19 ENCOUNTER — Other Ambulatory Visit: Payer: Self-pay

## 2019-09-19 ENCOUNTER — Ambulatory Visit
Admission: RE | Admit: 2019-09-19 | Discharge: 2019-09-19 | Disposition: A | Discharge status: Home / Self Care | Payer: No Typology Code available for payment source | Source: Ambulatory Visit | Attending: Internal Medicine | Admitting: Internal Medicine

## 2019-09-19 ENCOUNTER — Encounter: Payer: Self-pay | Admitting: *Deleted

## 2019-09-19 ENCOUNTER — Telehealth: Payer: Self-pay | Admitting: Internal Medicine

## 2019-09-19 DIAGNOSIS — K769 Liver disease, unspecified: Secondary | ICD-10-CM | POA: Diagnosis present

## 2019-09-19 DIAGNOSIS — R937 Abnormal findings on diagnostic imaging of other parts of musculoskeletal system: Secondary | ICD-10-CM | POA: Diagnosis present

## 2019-09-19 DIAGNOSIS — C50412 Malignant neoplasm of upper-outer quadrant of left female breast: Secondary | ICD-10-CM | POA: Insufficient documentation

## 2019-09-19 DIAGNOSIS — Z170 Estrogen receptor positive status [ER+]: Secondary | ICD-10-CM | POA: Diagnosis present

## 2019-09-19 MED ORDER — GADOBUTROL 1 MMOL/ML IV SOLN
6.0000 mL | Freq: Once | INTRAVENOUS | Status: AC | PRN
  Administered 2019-09-19: 16:00:00 6 mL via INTRAVENOUS

## 2019-09-19 NOTE — Telephone Encounter (Signed)
I spoke to patient's husband regarding the results of the MRI spine/MRI liver-negative for malignancy.  Reassured imaging negative for any metastatic disease.  Agree with proceeding with surgery as planned on February 16 as per husband.    Defer to GI regarding further work-up for elevated alkaline phosphatase.  Patient follow-up with me a week or so post surgery-to review the treatment plan.  FYI-Drs. Wakefield/Anna/Duncan.

## 2019-09-19 NOTE — Progress Notes (Signed)
Called patient and her left her a message with her follow up appointment with Dr. Rogue Bussing on 10/08/19 @10 :78.

## 2019-09-20 NOTE — Telephone Encounter (Signed)
Noted.  I appreciate the help of all involved. 

## 2019-09-23 ENCOUNTER — Inpatient Hospital Stay: Payer: No Typology Code available for payment source

## 2019-09-24 ENCOUNTER — Other Ambulatory Visit: Payer: Self-pay

## 2019-09-24 ENCOUNTER — Encounter: Payer: Self-pay | Admitting: Diagnostic Radiology

## 2019-09-24 ENCOUNTER — Encounter (HOSPITAL_BASED_OUTPATIENT_CLINIC_OR_DEPARTMENT_OTHER): Payer: Self-pay | Admitting: General Surgery

## 2019-09-24 LAB — SURGICAL PATHOLOGY

## 2019-09-27 ENCOUNTER — Other Ambulatory Visit (HOSPITAL_COMMUNITY)
Admission: RE | Admit: 2019-09-27 | Discharge: 2019-09-27 | Disposition: A | Discharge status: Home / Self Care | Payer: No Typology Code available for payment source | Source: Ambulatory Visit | Attending: General Surgery | Admitting: General Surgery

## 2019-09-27 DIAGNOSIS — Z01812 Encounter for preprocedural laboratory examination: Secondary | ICD-10-CM | POA: Diagnosis not present

## 2019-09-27 DIAGNOSIS — Z20822 Contact with and (suspected) exposure to covid-19: Secondary | ICD-10-CM | POA: Diagnosis not present

## 2019-09-27 LAB — SARS CORONAVIRUS 2 (TAT 6-24 HRS): SARS Coronavirus 2: NEGATIVE

## 2019-09-30 ENCOUNTER — Other Ambulatory Visit: Payer: Self-pay

## 2019-09-30 ENCOUNTER — Ambulatory Visit
Admission: RE | Admit: 2019-09-30 | Discharge: 2019-09-30 | Disposition: A | Discharge status: Home / Self Care | Payer: No Typology Code available for payment source | Source: Ambulatory Visit | Attending: General Surgery | Admitting: General Surgery

## 2019-09-30 DIAGNOSIS — Z170 Estrogen receptor positive status [ER+]: Secondary | ICD-10-CM

## 2019-09-30 DIAGNOSIS — C50412 Malignant neoplasm of upper-outer quadrant of left female breast: Secondary | ICD-10-CM

## 2019-09-30 MED ORDER — ENSURE PRE-SURGERY PO LIQD: 296.0000 mL | Freq: Once | ORAL | Status: DC

## 2019-09-30 NOTE — Progress Notes (Signed)

## 2019-10-01 ENCOUNTER — Ambulatory Visit (HOSPITAL_BASED_OUTPATIENT_CLINIC_OR_DEPARTMENT_OTHER): Payer: No Typology Code available for payment source | Admitting: Anesthesiology

## 2019-10-01 ENCOUNTER — Encounter (HOSPITAL_BASED_OUTPATIENT_CLINIC_OR_DEPARTMENT_OTHER)
Admission: RE | Disposition: A | Discharge status: Home / Self Care | Payer: Self-pay | Source: Home / Self Care | Attending: General Surgery

## 2019-10-01 ENCOUNTER — Ambulatory Visit
Admission: RE | Admit: 2019-10-01 | Discharge: 2019-10-01 | Disposition: A | Discharge status: Home / Self Care | Payer: No Typology Code available for payment source | Source: Ambulatory Visit | Attending: General Surgery | Admitting: General Surgery

## 2019-10-01 ENCOUNTER — Ambulatory Visit (HOSPITAL_BASED_OUTPATIENT_CLINIC_OR_DEPARTMENT_OTHER)
Admission: RE | Admit: 2019-10-01 | Discharge: 2019-10-01 | Disposition: A | Discharge status: Home / Self Care | Payer: No Typology Code available for payment source | Attending: General Surgery | Admitting: General Surgery

## 2019-10-01 ENCOUNTER — Other Ambulatory Visit: Payer: Self-pay

## 2019-10-01 ENCOUNTER — Encounter (HOSPITAL_COMMUNITY)
Admission: RE | Admit: 2019-10-01 | Discharge: 2019-10-01 | Disposition: A | Discharge status: Home / Self Care | Payer: No Typology Code available for payment source | Source: Ambulatory Visit | Attending: General Surgery | Admitting: General Surgery

## 2019-10-01 ENCOUNTER — Encounter (HOSPITAL_BASED_OUTPATIENT_CLINIC_OR_DEPARTMENT_OTHER): Payer: Self-pay | Admitting: General Surgery

## 2019-10-01 DIAGNOSIS — Z800 Family history of malignant neoplasm of digestive organs: Secondary | ICD-10-CM | POA: Insufficient documentation

## 2019-10-01 DIAGNOSIS — Z170 Estrogen receptor positive status [ER+]: Secondary | ICD-10-CM

## 2019-10-01 DIAGNOSIS — C50412 Malignant neoplasm of upper-outer quadrant of left female breast: Secondary | ICD-10-CM | POA: Diagnosis present

## 2019-10-01 DIAGNOSIS — G43909 Migraine, unspecified, not intractable, without status migrainosus: Secondary | ICD-10-CM | POA: Diagnosis not present

## 2019-10-01 DIAGNOSIS — F329 Major depressive disorder, single episode, unspecified: Secondary | ICD-10-CM | POA: Insufficient documentation

## 2019-10-01 DIAGNOSIS — Z803 Family history of malignant neoplasm of breast: Secondary | ICD-10-CM | POA: Insufficient documentation

## 2019-10-01 DIAGNOSIS — J449 Chronic obstructive pulmonary disease, unspecified: Secondary | ICD-10-CM | POA: Diagnosis not present

## 2019-10-01 DIAGNOSIS — C773 Secondary and unspecified malignant neoplasm of axilla and upper limb lymph nodes: Secondary | ICD-10-CM | POA: Diagnosis not present

## 2019-10-01 DIAGNOSIS — F419 Anxiety disorder, unspecified: Secondary | ICD-10-CM | POA: Insufficient documentation

## 2019-10-01 DIAGNOSIS — E039 Hypothyroidism, unspecified: Secondary | ICD-10-CM | POA: Insufficient documentation

## 2019-10-01 DIAGNOSIS — Z79890 Hormone replacement therapy (postmenopausal): Secondary | ICD-10-CM | POA: Insufficient documentation

## 2019-10-01 DIAGNOSIS — Z79899 Other long term (current) drug therapy: Secondary | ICD-10-CM | POA: Insufficient documentation

## 2019-10-01 HISTORY — PX: BREAST LUMPECTOMY WITH RADIOACTIVE SEED AND SENTINEL LYMPH NODE BIOPSY: SHX6550

## 2019-10-01 HISTORY — DX: Nausea with vomiting, unspecified: R11.2

## 2019-10-01 HISTORY — DX: Other specified postprocedural states: Z98.890

## 2019-10-01 SURGERY — BREAST LUMPECTOMY WITH RADIOACTIVE SEED AND SENTINEL LYMPH NODE BIOPSY
Anesthesia: Regional | Site: Breast | Laterality: Left

## 2019-10-01 MED ORDER — CLONIDINE HCL (ANALGESIA) 100 MCG/ML EP SOLN
EPIDURAL | Status: DC | PRN
  Administered 2019-10-01: 100 ug

## 2019-10-01 MED ORDER — HYDROMORPHONE HCL 1 MG/ML IJ SOLN
INTRAMUSCULAR | Status: AC
  Filled 2019-10-01: qty 0.5

## 2019-10-01 MED ORDER — ACETAMINOPHEN 500 MG PO TABS
1000.0000 mg | ORAL_TABLET | ORAL | Status: AC
  Administered 2019-10-01: 1000 mg via ORAL

## 2019-10-01 MED ORDER — EPHEDRINE SULFATE-NACL 50-0.9 MG/10ML-% IV SOSY
PREFILLED_SYRINGE | INTRAVENOUS | Status: DC | PRN
  Administered 2019-10-01 (×2): 10 mg via INTRAVENOUS

## 2019-10-01 MED ORDER — FENTANYL CITRATE (PF) 250 MCG/5ML IJ SOLN
INTRAMUSCULAR | Status: DC | PRN
  Administered 2019-10-01 (×4): 25 ug via INTRAVENOUS

## 2019-10-01 MED ORDER — FENTANYL CITRATE (PF) 100 MCG/2ML IJ SOLN
INTRAMUSCULAR | Status: AC
  Filled 2019-10-01: qty 2

## 2019-10-01 MED ORDER — KETOROLAC TROMETHAMINE 30 MG/ML IJ SOLN: 30.0000 mg | Freq: Once | INTRAMUSCULAR | Status: DC | PRN

## 2019-10-01 MED ORDER — PROMETHAZINE HCL 25 MG/ML IJ SOLN
INTRAMUSCULAR | Status: AC
  Filled 2019-10-01: qty 1

## 2019-10-01 MED ORDER — KETOROLAC TROMETHAMINE 15 MG/ML IJ SOLN
INTRAMUSCULAR | Status: AC
  Filled 2019-10-01: qty 1

## 2019-10-01 MED ORDER — MIDAZOLAM HCL 2 MG/2ML IJ SOLN
INTRAMUSCULAR | Status: AC
  Filled 2019-10-01: qty 2

## 2019-10-01 MED ORDER — ONDANSETRON HCL 4 MG/2ML IJ SOLN
INTRAMUSCULAR | Status: AC
  Filled 2019-10-01: qty 2

## 2019-10-01 MED ORDER — CEFAZOLIN SODIUM-DEXTROSE 2-4 GM/100ML-% IV SOLN
2.0000 g | INTRAVENOUS | Status: AC
  Administered 2019-10-01: 2 g via INTRAVENOUS

## 2019-10-01 MED ORDER — ROPIVACAINE HCL 5 MG/ML IJ SOLN
INTRAMUSCULAR | Status: DC | PRN
  Administered 2019-10-01: 30 mL via PERINEURAL

## 2019-10-01 MED ORDER — PROPOFOL 10 MG/ML IV BOLUS
INTRAVENOUS | Status: DC | PRN
  Administered 2019-10-01: 150 mg via INTRAVENOUS

## 2019-10-01 MED ORDER — LIDOCAINE 2% (20 MG/ML) 5 ML SYRINGE
INTRAMUSCULAR | Status: AC
  Filled 2019-10-01: qty 5

## 2019-10-01 MED ORDER — LACTATED RINGERS IV SOLN: INTRAVENOUS | Status: DC

## 2019-10-01 MED ORDER — GABAPENTIN 100 MG PO CAPS
ORAL_CAPSULE | ORAL | Status: AC
  Filled 2019-10-01: qty 1

## 2019-10-01 MED ORDER — HYDROMORPHONE HCL 1 MG/ML IJ SOLN
0.2500 mg | INTRAMUSCULAR | Status: DC | PRN
  Administered 2019-10-01 (×2): 0.25 mg via INTRAVENOUS

## 2019-10-01 MED ORDER — MIDAZOLAM HCL 2 MG/2ML IJ SOLN
1.0000 mg | INTRAMUSCULAR | Status: DC | PRN
  Administered 2019-10-01: 2 mg via INTRAVENOUS

## 2019-10-01 MED ORDER — DEXAMETHASONE SODIUM PHOSPHATE 10 MG/ML IJ SOLN
INTRAMUSCULAR | Status: DC | PRN
  Administered 2019-10-01: 4 mg via INTRAVENOUS

## 2019-10-01 MED ORDER — ACETAMINOPHEN 500 MG PO TABS
ORAL_TABLET | ORAL | Status: AC
  Filled 2019-10-01: qty 2

## 2019-10-01 MED ORDER — PROPOFOL 10 MG/ML IV BOLUS
INTRAVENOUS | Status: AC
  Filled 2019-10-01: qty 20

## 2019-10-01 MED ORDER — KETOROLAC TROMETHAMINE 15 MG/ML IJ SOLN
15.0000 mg | INTRAMUSCULAR | Status: AC
  Administered 2019-10-01: 15 mg via INTRAVENOUS

## 2019-10-01 MED ORDER — PHENYLEPHRINE 40 MCG/ML (10ML) SYRINGE FOR IV PUSH (FOR BLOOD PRESSURE SUPPORT)
PREFILLED_SYRINGE | INTRAVENOUS | Status: AC
  Filled 2019-10-01: qty 10

## 2019-10-01 MED ORDER — FENTANYL CITRATE (PF) 100 MCG/2ML IJ SOLN
50.0000 ug | INTRAMUSCULAR | Status: DC | PRN
  Administered 2019-10-01: 50 ug via INTRAVENOUS

## 2019-10-01 MED ORDER — PROMETHAZINE HCL 25 MG/ML IJ SOLN
6.2500 mg | INTRAMUSCULAR | Status: DC | PRN
  Administered 2019-10-01: 6.25 mg via INTRAVENOUS

## 2019-10-01 MED ORDER — BUPIVACAINE HCL (PF) 0.25 % IJ SOLN
INTRAMUSCULAR | Status: DC | PRN
  Administered 2019-10-01: 7 mL

## 2019-10-01 MED ORDER — OXYCODONE HCL 5 MG PO TABS: 5.0000 mg | ORAL_TABLET | Freq: Four times a day (QID) | ORAL | 0 refills | Status: DC | PRN

## 2019-10-01 MED ORDER — LIDOCAINE 2% (20 MG/ML) 5 ML SYRINGE
INTRAMUSCULAR | Status: DC | PRN
  Administered 2019-10-01: 20 mg via INTRAVENOUS

## 2019-10-01 MED ORDER — ONDANSETRON HCL 4 MG/2ML IJ SOLN
INTRAMUSCULAR | Status: DC | PRN
  Administered 2019-10-01: 4 mg via INTRAVENOUS

## 2019-10-01 MED ORDER — GABAPENTIN 100 MG PO CAPS
100.0000 mg | ORAL_CAPSULE | ORAL | Status: AC
  Administered 2019-10-01: 100 mg via ORAL

## 2019-10-01 MED ORDER — CEFAZOLIN SODIUM-DEXTROSE 2-4 GM/100ML-% IV SOLN
INTRAVENOUS | Status: AC
  Filled 2019-10-01: qty 100

## 2019-10-01 SURGICAL SUPPLY — 51 items
APPLIER CLIP 9.375 MED OPEN (MISCELLANEOUS) ×2
BINDER BREAST LRG (GAUZE/BANDAGES/DRESSINGS) IMPLANT
BINDER BREAST MEDIUM (GAUZE/BANDAGES/DRESSINGS) IMPLANT
BLADE SURG 15 STRL LF DISP TIS (BLADE) ×1 IMPLANT
BLADE SURG 15 STRL SS (BLADE) ×1
CANISTER SUCT 1200ML W/VALVE (MISCELLANEOUS) ×2 IMPLANT
CHLORAPREP W/TINT 26 (MISCELLANEOUS) ×2 IMPLANT
CLIP APPLIE 9.375 MED OPEN (MISCELLANEOUS) ×1 IMPLANT
CLIP VESOCCLUDE SM WIDE 6/CT (CLIP) ×2 IMPLANT
COVER BACK TABLE 60X90IN (DRAPES) ×2 IMPLANT
COVER MAYO STAND STRL (DRAPES) ×2 IMPLANT
COVER PROBE W GEL 5X96 (DRAPES) ×2 IMPLANT
DERMABOND ADVANCED (GAUZE/BANDAGES/DRESSINGS) ×1
DERMABOND ADVANCED .7 DNX12 (GAUZE/BANDAGES/DRESSINGS) ×1 IMPLANT
DRAPE LAPAROSCOPIC ABDOMINAL (DRAPES) ×2 IMPLANT
DRAPE UTILITY XL STRL (DRAPES) ×2 IMPLANT
ELECT COATED BLADE 2.86 ST (ELECTRODE) ×2 IMPLANT
ELECT REM PT RETURN 9FT ADLT (ELECTROSURGICAL) ×2
ELECTRODE REM PT RTRN 9FT ADLT (ELECTROSURGICAL) ×1 IMPLANT
GLOVE BIO SURGEON STRL SZ7 (GLOVE) ×6 IMPLANT
GLOVE BIO SURGEON STRL SZ7.5 (GLOVE) ×2 IMPLANT
GLOVE BIOGEL PI IND STRL 7.5 (GLOVE) ×2 IMPLANT
GLOVE BIOGEL PI IND STRL 8 (GLOVE) ×1 IMPLANT
GLOVE BIOGEL PI INDICATOR 7.5 (GLOVE) ×2
GLOVE BIOGEL PI INDICATOR 8 (GLOVE) ×1
GOWN STRL REUS W/ TWL LRG LVL3 (GOWN DISPOSABLE) ×4 IMPLANT
GOWN STRL REUS W/TWL LRG LVL3 (GOWN DISPOSABLE) ×4
HEMOSTAT ARISTA ABSORB 3G PWDR (HEMOSTASIS) IMPLANT
KIT MARKER MARGIN INK (KITS) ×2 IMPLANT
NEEDLE HYPO 25X1 1.5 SAFETY (NEEDLE) ×2 IMPLANT
NS IRRIG 1000ML POUR BTL (IV SOLUTION) ×2 IMPLANT
PACK BASIN DAY SURGERY FS (CUSTOM PROCEDURE TRAY) ×2 IMPLANT
PENCIL SMOKE EVACUATOR (MISCELLANEOUS) ×2 IMPLANT
RETRACTOR ONETRAX LX 90X20 (MISCELLANEOUS) ×2 IMPLANT
SLEEVE SCD COMPRESS KNEE MED (MISCELLANEOUS) ×2 IMPLANT
SPONGE LAP 4X18 RFD (DISPOSABLE) ×2 IMPLANT
STRIP CLOSURE SKIN 1/2X4 (GAUZE/BANDAGES/DRESSINGS) ×2 IMPLANT
SUT ETHILON 2 0 FS 18 (SUTURE) IMPLANT
SUT MNCRL AB 4-0 PS2 18 (SUTURE) ×2 IMPLANT
SUT MON AB 5-0 PS2 18 (SUTURE) ×2 IMPLANT
SUT SILK 2 0 SH (SUTURE) ×2 IMPLANT
SUT VIC AB 2-0 SH 27 (SUTURE) ×3
SUT VIC AB 2-0 SH 27XBRD (SUTURE) ×3 IMPLANT
SUT VIC AB 3-0 SH 27 (SUTURE) ×1
SUT VIC AB 3-0 SH 27X BRD (SUTURE) ×1 IMPLANT
SUT VIC AB 5-0 PS2 18 (SUTURE) IMPLANT
SYR CONTROL 10ML LL (SYRINGE) ×2 IMPLANT
TOWEL GREEN STERILE FF (TOWEL DISPOSABLE) ×2 IMPLANT
TRAY FAXITRON CT DISP (TRAY / TRAY PROCEDURE) ×2 IMPLANT
TUBE CONNECTING 20X1/4 (TUBING) ×2 IMPLANT
YANKAUER SUCT BULB TIP NO VENT (SUCTIONS) ×2 IMPLANT

## 2019-10-01 NOTE — Progress Notes (Signed)
Assisted Dr. Ellender with left, ultrasound guided, pectoralis block. Side rails up, monitors on throughout procedure. See vital signs in flow sheet. Tolerated Procedure well. 

## 2019-10-01 NOTE — Discharge Instructions (Signed)
[place] Office Phone Number [phone]  BREAST BIOPSY/ PARTIAL MASTECTOMY: POST OP INSTRUCTIONS Take 400 mg of ibuprofen every 8 hours(Next dose of Ibuprofen may be given at 8pm) or 650 mg tylenol every 6 hours for next 72 hours then as needed. (Next dose of tylenol may be given at 4:45pm)Use ice several times daily also. Always review your discharge instruction sheet given to you by the facility where your surgery was performed.  IF YOU HAVE DISABILITY OR FAMILY LEAVE FORMS, YOU MUST BRING THEM TO THE OFFICE FOR PROCESSING.  DO NOT GIVE THEM TO YOUR DOCTOR.  1. A prescription for pain medication may be given to you upon discharge.  Take your pain medication as prescribed, if needed.  If narcotic pain medicine is not needed, then you may take acetaminophen (Tylenol), naprosyn (Alleve) or ibuprofen (Advil) as needed. 2. Take your usually prescribed medications unless otherwise directed 3. If you need a refill on your pain medication, please contact your pharmacy.  They will contact our office to request authorization.  Prescriptions will not be filled after 5pm or on week-ends. 4. You should eat very light the first 24 hours after surgery, such as soup, crackers, pudding, etc.  Resume your normal diet the day after surgery. 5. Most patients will experience some swelling and bruising in the breast.  Ice packs and a good support bra will help.  Wear the breast binder provided or a sports bra for 72 hours day and night.  After that wear a sports bra during the day until you return to the office. Swelling and bruising can take several days to resolve.  6. It is common to experience some constipation if taking pain medication after surgery.  Increasing fluid intake and taking a stool softener will usually help or prevent this problem from occurring.  A mild laxative (Milk of Magnesia or Miralax) should be taken according to package directions if there are no bowel movements after 48  hours. 7. Unless discharge instructions indicate otherwise, you may remove your bandages 48 hours after surgery and you may shower at that time.  You may have steri-strips (small skin tapes) in place directly over the incision.  These strips should be left on the skin for 7-10 days and will come off on their own.  If your surgeon used skin glue on the incision, you may shower in 24 hours.  The glue will flake off over the next 2-3 weeks.  Any sutures or staples will be removed at the office during your follow-up visit. 8. ACTIVITIES:  You may resume regular daily activities (gradually increasing) beginning the next day.  Wearing a good support bra or sports bra minimizes pain and swelling.  You may have sexual intercourse when it is comfortable. a. You may drive when you no longer are taking prescription pain medication, you can comfortably wear a seatbelt, and you can safely maneuver your car and apply brakes. b. RETURN TO WORK:  ______________________________________________________________________________________ 9. You should see your doctor in the office for a follow-up appointment approximately two weeks after your surgery.  Your doctor's nurse will typically make your follow-up appointment when she calls you with your pathology report.  Expect your pathology report 3-4 business days after your surgery.  You may call to check if you do not hear from [place] after three days. 10. OTHER INSTRUCTIONS: _______________________________________________________________________________________________ _____________________________________________________________________________________________________________________________________ _____________________________________________________________________________________________________________________________________ _____________________________________________________________________________________________________________________________________  WHEN TO CALL DR  WAKE[place]: 1. Fever over 101.0 2. Nausea and/or vomiting. 3. Extreme swelling or bruising. 4.  Continued bleeding from incision. 5. Increased pain, redness, or drainage from the incision.  The clinic staff is available to answer your questions during regular business hours.  Please don't hesitate to call and ask to speak to one of the nurses for clinical concerns.  If you have a medical emergency, go to the nearest emergency room or call 911.  A surgeon from [facility] is always on call at the hospital.  For further questions, please visit [email or web address]      Post Anesthesia Home Care Instructions  Activity: Get plenty of rest for the remainder of the day. A responsible individual must stay with you for 24 hours following the procedure.  For the next 24 hours, DO NOT: -Drive a car -Paediatric nurse -Drink alcoholic beverages -Take any medication unless instructed by your physician -Make any legal decisions or sign important papers.  Meals: Start with liquid foods such as gelatin or soup. Progress to regular foods as tolerated. Avoid greasy, spicy, heavy foods. If nausea and/or vomiting occur, drink only clear liquids until the nausea and/or vomiting subsides. Call your physician if vomiting continues.  Special Instructions/Symptoms: Your throat may feel dry or sore from the anesthesia or the breathing tube placed in your throat during surgery. If this causes discomfort, gargle with warm salt water. The discomfort should disappear within 24 hours.  If you had a scopolamine patch placed behind your ear for the management of post- operative nausea and/or vomiting:  1. The medication in the patch is effective for 72 hours, after which it should be removed.  Wrap patch in a tissue and discard in the trash. Wash hands thoroughly with soap and water. 2. You may remove the patch earlier than 72 hours if you experience unpleasant side effects which may include  dry mouth, dizziness or visual disturbances. 3. Avoid touching the patch. Wash your hands with soap and water after contact with the patch.      Call your surgeon if you experience:   1.  Fever over 101.0. 2.  Inability to urinate. 3.  Nausea and/or vomiting. 4.  Extreme swelling or bruising at the surgical site. 5.  Continued bleeding from the incision. 6.  Increased pain, redness or drainage from the incision. 7.  Problems related to your pain medication. 8.  Any problems and/or concerns

## 2019-10-01 NOTE — H&P (Signed)
41 yof referred by Dr Damita Dunnings for new left breast cancer. she has fh in a paternal gm, paternal aunt and paternal cousin. Has colon cancer on fathers side as well. did not grow up with that side of family so this has taken some investigating. she has no prior breast history. she noted a left breast mass a few months ago. she was caring for her dying father and did not have this evaluated. no discharge. she noted some skin changes and that led her to have this looked at. she had mm/us. she has c density breasts, in luoq there is a 2.2 cm mass on mm and on [place] is 2.3x1.6x1.9 cm in size. ax nodes are negative on [place]. 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 there is no lvi. She had mrii with additional lesion about 1.5 cm away . 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   Past Surgical History  Appendectomy   Diagnostic Studies History  Mammogram  within last year  Allergies Sulfa Antibiotics   Medication History Tylenol (325MG  Tablet, Oral) Active. Advil (200MG  Tablet, Oral) Active. Fioricet (50-325-40MG  Tablet, Oral) Active. CeleXA (40MG  Tablet, Oral) Active. Levothroid (88MCG Tablet, Oral) Active. Medications Reconciled  Social History  Current tobacco use  vapes No alcohol use   Pregnancy / Birth History Contraceptive History  Oral contraceptives.  Other Problems  Migraine Headache  Thyroid Disease   Review of Systems Breast Present- Breast Mass. Not Present- Breast Pain, Nipple Discharge and Skin Changes.  Physical Exam  General Mental Status-Alert. Orientation-Oriented X3. Head and Neck Trachea-midline. Thyroid Gland Characteristics - normal size and consistency. Eye Sclera/Conjunctiva - Bilateral-No scleral icterus. Chest and Lung Exam Chest and lung exam reveals -quiet, even and easy respiratory effort with no use  of accessory muscles. Breast Nipples-No Discharge. Note: luoq 2.5 cm mass mobile, ? nac changes its flatter Cardiovascular Cardiovascular examination reveals -normal heart sounds, regular rate and rhythm with no murmurs. Neurologic Neurologic evaluation reveals -alert and oriented x 3 with no impairment of recent or remote memory. Lymphatic Head & Neck General Head & Neck Lymphatics: Bilateral - Description - Normal. Axillary General Axillary Region: Bilateral - Description - Normal. Note: no [place] adenopathy   Assessment & Plan Rolm Bookbinder MD; 08/26/2019 2:16 PM) BREAST CANCER OF UPPER-OUTER QUADRANT OF LEFT FEMALE BREAST (C50.412) Left breast seed guided bracketed/double lumpectomy, left ax sn biopsy We discussed the staging and pathophysiology of breast cancer. We discussed all of the different options for treatment for breast cancer including surgery, chemotherapy, radiation therapy, Herceptin, and antiestrogen therapy. We discussed a sentinel lymph node biopsy as she does not appear to having lymph node involvement right now. We discussed the performance of that with injection of radioactive tracer. We discussed that there is a chance of having a positive node with a sentinel lymph node biopsy and we will await the permanent pathology to make any other first further decisions in terms of her treatment. We discussed up to a 5% risk lifetime of chronic shoulder pain as well as lymphedema associated with a sentinel lymph node biopsy. We discussed the options for treatment of the breast cancer which included lumpectomy versus a mastectomy. We discussed the performance of the lumpectomy with radioactive seed placement. We discussed a 5-10% chance of a positive margin requiring reexcision in the operating room. We also discussed that she will likely need radiation therapy if she  undergoes lumpectomy. The breast cannot undergo more radiation therapy in the same breast after lumpectomy  in the future. We discussed mastectomy and the postoperative care for that as well. Mastectomy can be followed by reconstruction. The decision for lumpectomy vs mastectomy has no impact on decision for chemotherapy. Most mastectomy patients will not need radiation therapy. We discussed that there is no difference in her survival whether she undergoes lumpectomy with radiation therapy or antiestrogen therapy versus a mastectomy. There is also no real difference between her recurrence in the breast. We discussed the risks of operation including bleeding, infection, possible reoperation. She understands her further therapy will be based on what her stages at the time of her operation.

## 2019-10-01 NOTE — H&P (View-Only) (Signed)
71 yof referred by Dr Damita Dunnings for new left breast cancer. she has fh in a paternal gm, paternal aunt and paternal cousin. Has colon cancer on fathers side as well. did not grow up with that side of family so this has taken some investigating. she has no prior breast history. she noted a left breast mass a few months ago. she was caring for her dying father and did not have this evaluated. no discharge. she noted some skin changes and that led her to have this looked at. she had mm/us. she has c density breasts, in luoq there is a 2.2 cm mass on mm and on [place] is 2.3x1.6x1.9 cm in size. ax nodes are negative on [place]. 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 there is no lvi. She had mrii with additional lesion about 1.5 cm away . 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   Past Surgical History  Appendectomy   Diagnostic Studies History  Mammogram  within last year  Allergies Sulfa Antibiotics   Medication History Tylenol (325MG  Tablet, Oral) Active. Advil (200MG  Tablet, Oral) Active. Fioricet (50-325-40MG  Tablet, Oral) Active. CeleXA (40MG  Tablet, Oral) Active. Levothroid (88MCG Tablet, Oral) Active. Medications Reconciled  Social History  Current tobacco use  vapes No alcohol use   Pregnancy / Birth History Contraceptive History  Oral contraceptives.  Other Problems  Migraine Headache  Thyroid Disease   Review of Systems Breast Present- Breast Mass. Not Present- Breast Pain, Nipple Discharge and Skin Changes.  Physical Exam  General Mental Status-Alert. Orientation-Oriented X3. Head and Neck Trachea-midline. Thyroid Gland Characteristics - normal size and consistency. Eye Sclera/Conjunctiva - Bilateral-No scleral icterus. Chest and Lung Exam Chest and lung exam reveals -quiet, even and easy respiratory effort with no use  of accessory muscles. Breast Nipples-No Discharge. Note: luoq 2.5 cm mass mobile, ? nac changes its flatter Cardiovascular Cardiovascular examination reveals -normal heart sounds, regular rate and rhythm with no murmurs. Neurologic Neurologic evaluation reveals -alert and oriented x 3 with no impairment of recent or remote memory. Lymphatic Head & Neck General Head & Neck Lymphatics: Bilateral - Description - Normal. Axillary General Axillary Region: Bilateral - Description - Normal. Note: no [place] adenopathy   Assessment & Plan Rolm Bookbinder MD; 08/26/2019 2:16 PM) BREAST CANCER OF UPPER-OUTER QUADRANT OF LEFT FEMALE BREAST (C50.412) Left breast seed guided bracketed/double lumpectomy, left ax sn biopsy We discussed the staging and pathophysiology of breast cancer. We discussed all of the different options for treatment for breast cancer including surgery, chemotherapy, radiation therapy, Herceptin, and antiestrogen therapy. We discussed a sentinel lymph node biopsy as she does not appear to having lymph node involvement right now. We discussed the performance of that with injection of radioactive tracer. We discussed that there is a chance of having a positive node with a sentinel lymph node biopsy and we will await the permanent pathology to make any other first further decisions in terms of her treatment. We discussed up to a 5% risk lifetime of chronic shoulder pain as well as lymphedema associated with a sentinel lymph node biopsy. We discussed the options for treatment of the breast cancer which included lumpectomy versus a mastectomy. We discussed the performance of the lumpectomy with radioactive seed placement. We discussed a 5-10% chance of a positive margin requiring reexcision in the operating room. We also discussed that she will likely need radiation therapy if she  undergoes lumpectomy. The breast cannot undergo more radiation therapy in the same breast after lumpectomy  in the future. We discussed mastectomy and the postoperative care for that as well. Mastectomy can be followed by reconstruction. The decision for lumpectomy vs mastectomy has no impact on decision for chemotherapy. Most mastectomy patients will not need radiation therapy. We discussed that there is no difference in her survival whether she undergoes lumpectomy with radiation therapy or antiestrogen therapy versus a mastectomy. There is also no real difference between her recurrence in the breast. We discussed the risks of operation including bleeding, infection, possible reoperation. She understands her further therapy will be based on what her stages at the time of her operation.

## 2019-10-01 NOTE — Anesthesia Postprocedure Evaluation (Signed)
Anesthesia Post Note  Patient: Jessica Barnes  Procedure(s) Performed: LEFT BREAST LUMPECTOMY WITH BRACKETED RADIOACTIVE SEEDS AND LEFT AXILLARY SENTINEL LYMPH NODE BIOPSY (Left Breast)     Patient location during evaluation: PACU Anesthesia Type: Regional and General Level of consciousness: awake and alert Pain management: pain level controlled Vital Signs Assessment: post-procedure vital signs reviewed and stable Respiratory status: spontaneous breathing, nonlabored ventilation, respiratory function stable and patient connected to nasal cannula oxygen Cardiovascular status: blood pressure returned to baseline and stable Postop Assessment: no apparent nausea or vomiting Anesthetic complications: no    Last Vitals:  Vitals:   10/01/19 1445 10/01/19 1515  BP: 117/81 113/60  Pulse: 96   Resp: 18 20  Temp: 36.6 C (!) 36.2 C  SpO2:  100%    Last Pain:  Vitals:   10/01/19 1515  TempSrc: Tympanic  PainSc: 5                  [name]

## 2019-10-01 NOTE — Interval H&P Note (Signed)
History and Physical Interval Note:  10/01/2019 12:12 PM  Jessica Barnes  has presented today for surgery, with the diagnosis of LEFT BREAST CANCER.  The various methods of treatment have been discussed with the patient and family. After consideration of risks, benefits and other options for treatment, the patient has consented to  Procedure(s): LEFT BREAST LUMPECTOMY WITH BRACKETED RADIOACTIVE SEEDS AND LEFT AXILLARY SENTINEL LYMPH NODE BIOPSY (Left) as a surgical intervention.  The patient's history has been reviewed, patient examined, no change in status, stable for surgery.  I have reviewed the patient's chart and labs.  Questions were answered to the patient's satisfaction.     Rolm Bookbinder

## 2019-10-01 NOTE — Anesthesia Procedure Notes (Signed)
Anesthesia Regional Block: Pectoralis block   Pre-Anesthetic Checklist: ,, timeout performed, Correct Patient, Correct Site, Correct Laterality, Correct Procedure, Correct Position, site marked, Risks and benefits discussed,  Surgical consent,  Pre-op evaluation,  At surgeon's request and post-op pain management  Laterality: Left  Prep: chloraprep       Needles:  Injection technique: Single-shot  Needle Type: Echogenic Stimulator Needle     Needle Length: 9cm  Needle Gauge: 21     Additional Needles:   Procedures:,,,, ultrasound used (permanent image in chart),,,,  Narrative:  Start time: 10/01/2019 11:20 AM End time: 10/01/2019 11:30 AM Injection made incrementally with aspirations every 5 mL.  Performed by: Personally  Anesthesiologist: Murvin Natal, MD  Additional Notes: Functioning IV was confirmed and monitors were applied.  A timeout was performed. Sterile prep, hand hygiene and sterile gloves were used. A 36mm 21ga Arrow echogenic stimulator needle was used. Negative aspiration and negative test dose prior to incremental administration of local anesthetic. The patient tolerated the procedure well.  Ultrasound guidance: relevent anatomy identified, needle position confirmed, local anesthetic spread visualized around nerve(s), vascular puncture avoided.  Image printed for medical record.

## 2019-10-01 NOTE — Anesthesia Preprocedure Evaluation (Addendum)
Anesthesia Evaluation  Patient identified by MRN, date of birth, ID band Patient awake    Reviewed: Allergy & Precautions, NPO status , Patient's Chart, lab work & pertinent test results  History of Anesthesia Complications (+) PONV and history of anesthetic complications  Airway Mallampati: II  TM Distance: >3 FB Neck ROM: Full    Dental  (+) Chipped,    Pulmonary COPD, Patient abstained from smoking., former smoker,    Pulmonary exam normal breath sounds clear to auscultation       Cardiovascular negative cardio ROS Normal cardiovascular exam Rhythm:Regular Rate:Normal     Neuro/Psych  Headaches, PSYCHIATRIC DISORDERS Anxiety Depression PTSD (post-traumatic stress disorder)   GI/Hepatic negative GI ROS, Neg liver ROS,   Endo/Other  Hypothyroidism   Renal/GU negative Renal ROS     Musculoskeletal negative musculoskeletal ROS (+)   Abdominal   Peds  Hematology negative hematology ROS (+)   Anesthesia Other Findings LEFT BREAST CANCER  Reproductive/Obstetrics                            Anesthesia Physical Anesthesia Plan  ASA: II  Anesthesia Plan: General and Regional   Post-op Pain Management: GA combined w/ Regional for post-op pain   Induction: Intravenous  PONV Risk Score and Plan: 4 or greater and Ondansetron, Dexamethasone, Midazolam and Treatment may vary due to age or medical condition  Airway Management Planned: LMA  Additional Equipment:   Intra-op Plan:   Post-operative Plan: Extubation in OR  Informed Consent: I have reviewed the patients History and Physical, chart, labs and discussed the procedure including the risks, benefits and alternatives for the proposed anesthesia with the patient or authorized representative who has indicated his/her understanding and acceptance.     Dental advisory given  Plan Discussed with: CRNA  Anesthesia Plan Comments:         Anesthesia Quick Evaluation

## 2019-10-01 NOTE — Op Note (Signed)
Preoperative diagnosis:clinical stage II left breast cancer Postoperative diagnosis: Same as above Procedure:Leftbreastseed bracketed lumpectomy Leftdeep axillary sentinel node biopsy Surgeon: Dr. Serita Grammes Anesthesia: General  Specimens: 1.Leftbreast tissue marked with paint, containing seed and clip 2. Additional subareolar tissue marked with paint and final subareolar margin is marked short stitch superior, long stitch lateral double stitch deep 3.Leftdeep axillary nodes with highest count of 209 Estimated blood [id number] cc Complications: None Drains: None Sponge and count was correct at completion Disposition to recovery in stable condition  Indications:58 yof referred for new left breast cancer.  she noted a left breast mass a few months ago. she was caring for her dying father and did not have this evaluated. no discharge. she noted some skin changes and that led her to have this looked at. she had mm/us. she has c density breasts, in luoq there is a 2.2 cm mass on mm and on [place] is 2.3x1.6x1.9 cm in size. ax nodes are negative on [place]. 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 there is no lvi. She had mrii with additional lesion about 1.5 cm away . 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   Procedure: After informed consent was obtained shewas placed under general anesthesia without complication.She had undergone a pectoral block and injected with technetium in standard periareolar fashion.She was given antibiotics. SCDs were in place. She was then prepped and draped in the standard sterile surgical fashion. A surgical timeout was then performed.  I then identified the seeds in thesubareolar and uoqof the left breast.Imade aperiareolar incision to hide the scar later.Iinfiltrated Marcaine.I then tunneled to the seed using the  neoprobe for guidance. I then removed the seed and the surrounding tissue with an attempt to get a clear margin. The seedand clip wereon the specimen mammogram. I did take additional margins as listed above as the anterior seed came out with incision.  I was able to identify the subareolar mass and remove this as well. The anterior margin is the nipple/areolar complex now.  The smaller mass is part of the larger mass for margin purposes and the anterior margin of the small mass is only one that will matter.  I then placed clips in the cavity. I closed the breast tissuewith 2-0 vicryl, 3-0 vicryl and 5-0 monocryl.  I then placed glue and steristrips.  I then located the sentinel nodesin the low axilla. Ienteredthe axillary fascia. I then identified the sentinel node with a count of209  I excised this.The background radioactivity was less than 10% and there were no palpable nodes present.I then obtained hemostasis. I closed the fascia with 2-0 vicryl. I closed this with 2-0 vicryl 3-0 vicryl and 4-0 monocryl. She had a binder placed. She was extubated and transferred to recovery stable

## 2019-10-01 NOTE — Anesthesia Procedure Notes (Signed)
Procedure Name: LMA Insertion Date/Time: 10/01/2019 12:32 PM Performed by: Myna Bright, CRNA Pre-anesthesia Checklist: Patient identified, Emergency Drugs available, Suction available and Patient being monitored Patient Re-evaluated:Patient Re-evaluated prior to induction Oxygen Delivery Method: Circle system utilized Preoxygenation: Pre-oxygenation with 100% oxygen Induction Type: IV induction Ventilation: Mask ventilation without difficulty LMA: LMA inserted LMA Size: 4.0 Tube type: Oral Number of attempts: 1 Placement Confirmation: positive ETCO2 and breath sounds checked- equal and bilateral Tube secured with: Tape Dental Injury: Teeth and Oropharynx as per pre-operative assessment

## 2019-10-01 NOTE — Transfer of Care (Signed)
Immediate Anesthesia Transfer of Care Note  Patient: Shakeyra Jong Kassabian  Procedure(s) Performed: LEFT BREAST LUMPECTOMY WITH BRACKETED RADIOACTIVE SEEDS AND LEFT AXILLARY SENTINEL LYMPH NODE BIOPSY (Left Breast)  Patient Location: PACU  Anesthesia Type:GA combined with regional for post-op pain  Level of Consciousness: awake, alert , oriented and patient cooperative  Airway & Oxygen Therapy: Patient Spontanous Breathing and Patient connected to face mask oxygen  Post-op Assessment: Report given to RN, Post -op Vital signs reviewed and stable and Patient moving all extremities  Post vital signs: Reviewed and stable  Last Vitals:  Vitals Value Taken Time  BP 108/79 10/01/19 1345  Temp    Pulse 110 10/01/19 1348  Resp 14 10/01/19 1348  SpO2 100 % 10/01/19 1348  Vitals shown include unvalidated device data.  Last Pain:  Vitals:   10/01/19 1037  TempSrc: Tympanic  PainSc: 2          Complications: No apparent anesthesia complications

## 2019-10-02 ENCOUNTER — Encounter: Payer: Self-pay | Admitting: *Deleted

## 2019-10-03 ENCOUNTER — Other Ambulatory Visit: Payer: Self-pay

## 2019-10-07 ENCOUNTER — Inpatient Hospital Stay: Payer: No Typology Code available for payment source | Attending: Internal Medicine

## 2019-10-07 ENCOUNTER — Telehealth: Payer: Self-pay | Admitting: Internal Medicine

## 2019-10-07 ENCOUNTER — Other Ambulatory Visit: Payer: Self-pay | Admitting: Family Medicine

## 2019-10-07 DIAGNOSIS — C50412 Malignant neoplasm of upper-outer quadrant of left female breast: Secondary | ICD-10-CM | POA: Insufficient documentation

## 2019-10-07 DIAGNOSIS — Z800 Family history of malignant neoplasm of digestive organs: Secondary | ICD-10-CM | POA: Insufficient documentation

## 2019-10-07 DIAGNOSIS — J449 Chronic obstructive pulmonary disease, unspecified: Secondary | ICD-10-CM | POA: Insufficient documentation

## 2019-10-07 DIAGNOSIS — Z170 Estrogen receptor positive status [ER+]: Secondary | ICD-10-CM

## 2019-10-07 DIAGNOSIS — E785 Hyperlipidemia, unspecified: Secondary | ICD-10-CM | POA: Insufficient documentation

## 2019-10-07 DIAGNOSIS — F329 Major depressive disorder, single episode, unspecified: Secondary | ICD-10-CM | POA: Insufficient documentation

## 2019-10-07 DIAGNOSIS — E039 Hypothyroidism, unspecified: Secondary | ICD-10-CM | POA: Insufficient documentation

## 2019-10-07 DIAGNOSIS — Z87891 Personal history of nicotine dependence: Secondary | ICD-10-CM | POA: Insufficient documentation

## 2019-10-07 DIAGNOSIS — F419 Anxiety disorder, unspecified: Secondary | ICD-10-CM | POA: Insufficient documentation

## 2019-10-07 DIAGNOSIS — Z79899 Other long term (current) drug therapy: Secondary | ICD-10-CM | POA: Insufficient documentation

## 2019-10-07 DIAGNOSIS — Z803 Family history of malignant neoplasm of breast: Secondary | ICD-10-CM | POA: Insufficient documentation

## 2019-10-07 DIAGNOSIS — R748 Abnormal levels of other serum enzymes: Secondary | ICD-10-CM | POA: Insufficient documentation

## 2019-10-07 DIAGNOSIS — I700 Atherosclerosis of aorta: Secondary | ICD-10-CM | POA: Insufficient documentation

## 2019-10-07 NOTE — Telephone Encounter (Signed)
Electronic refill request. Diazepam Last office visit:   07/29/2019 Last Filled:   #30  1 RF on 08/06/2019 Please advise.

## 2019-10-07 NOTE — Telephone Encounter (Signed)
Spoke to Dr. Cheryl Flash the discrepancy in the pathology as discussed at the tumor conference on 2/22 at [facility] [#3.7 cm-margin lateral ? positive versus close; # 8 mm lesion subareolar-positive as mentioned pathology-however additional resection done as per Dr. Mariea Stable negative margins].   We will enter a formal consult for [facility] pathology-discussed with Dr. Reuel Derby.  We will discuss the findings with the patient-consultation on 2/23.  I would recommend adjuvant chemotherapy based on the findings; and also adjuvant radiation.  We will recommend holding off axillary lymph node dissection.  Discussed with Dr. Donne Hazel.

## 2019-10-08 ENCOUNTER — Other Ambulatory Visit: Payer: Self-pay

## 2019-10-08 ENCOUNTER — Inpatient Hospital Stay (HOSPITAL_BASED_OUTPATIENT_CLINIC_OR_DEPARTMENT_OTHER): Payer: No Typology Code available for payment source | Admitting: Internal Medicine

## 2019-10-08 DIAGNOSIS — F329 Major depressive disorder, single episode, unspecified: Secondary | ICD-10-CM | POA: Diagnosis not present

## 2019-10-08 DIAGNOSIS — Z170 Estrogen receptor positive status [ER+]: Secondary | ICD-10-CM | POA: Diagnosis not present

## 2019-10-08 DIAGNOSIS — R748 Abnormal levels of other serum enzymes: Secondary | ICD-10-CM | POA: Diagnosis not present

## 2019-10-08 DIAGNOSIS — C50412 Malignant neoplasm of upper-outer quadrant of left female breast: Secondary | ICD-10-CM | POA: Diagnosis present

## 2019-10-08 DIAGNOSIS — E785 Hyperlipidemia, unspecified: Secondary | ICD-10-CM | POA: Diagnosis not present

## 2019-10-08 DIAGNOSIS — E039 Hypothyroidism, unspecified: Secondary | ICD-10-CM | POA: Diagnosis not present

## 2019-10-08 DIAGNOSIS — I700 Atherosclerosis of aorta: Secondary | ICD-10-CM | POA: Diagnosis not present

## 2019-10-08 DIAGNOSIS — J449 Chronic obstructive pulmonary disease, unspecified: Secondary | ICD-10-CM | POA: Diagnosis not present

## 2019-10-08 DIAGNOSIS — F419 Anxiety disorder, unspecified: Secondary | ICD-10-CM | POA: Diagnosis not present

## 2019-10-08 DIAGNOSIS — Z79899 Other long term (current) drug therapy: Secondary | ICD-10-CM | POA: Diagnosis not present

## 2019-10-08 DIAGNOSIS — Z800 Family history of malignant neoplasm of digestive organs: Secondary | ICD-10-CM | POA: Diagnosis not present

## 2019-10-08 DIAGNOSIS — Z87891 Personal history of nicotine dependence: Secondary | ICD-10-CM | POA: Diagnosis not present

## 2019-10-08 DIAGNOSIS — Z803 Family history of malignant neoplasm of breast: Secondary | ICD-10-CM | POA: Diagnosis not present

## 2019-10-08 LAB — SLIDE CONSULT, PATHOLOGY ARMC

## 2019-10-08 MED ORDER — ONDANSETRON HCL 8 MG PO TABS: ORAL_TABLET | ORAL | 1 refills | Status: DC

## 2019-10-08 MED ORDER — LIDOCAINE-PRILOCAINE 2.5-2.5 % EX CREA: 1.0000 "application " | TOPICAL_CREAM | CUTANEOUS | 0 refills | Status: DC | PRN

## 2019-10-08 MED ORDER — PROCHLORPERAZINE MALEATE 10 MG PO TABS: 10.0000 mg | ORAL_TABLET | Freq: Four times a day (QID) | ORAL | 1 refills | Status: DC | PRN

## 2019-10-08 NOTE — Assessment & Plan Note (Addendum)
#  Left breast cancer-2 separate primaries- A] T2N1-ER/PR positive HER-2 negative; ?  Lateral margin positive; B] T1N1 ER/PR positive HER-2 negative; superior/inferior positive margins [see discussion below regarding margin status]  #Given the stage II breast cancer-lymph node involvement I would recommend adjuvant chemotherapy.  Would recommend holding of axillary lymph node dissection; as patient be candidate for radiation.  #Recommend Adriamycin -Cytoxan followed by Taxol weekly.  Given patient's clinical status I would recommend AC every 3 weeks followed by Taxol weekly.  # Discussed the potential side effects including but not limited to-increasing fatigue, nausea vomiting, diarrhea, hair loss, sores in the mouth, increase risk of infection and also neuropathy. Also discussed regarding potential side effects of heart failure/leukemia with Adriamycin.  We will get a 2D echo.  #Margin status-discrepant as per review of pathology at [facility]; awaiting formal [facility] pathology consultation.  Discussed after final review, will discuss with Dr. Donne Hazel regarding next plan.  Patient will need a port.  #Elevated alkaline phosphatase-160-200s-MRI including thoracic spine/liver negative for malignancy.  S/p evaluation with GI.  Again recommended the patient to keep the appointment as planned with GI.   #Significant anxiety-currently stable on SSRI.   # DISPOSITION: anti-emetics/emla # Echo asap # chemo ed in 1 week/ # follow up TBD- Dr.B

## 2019-10-08 NOTE — Progress Notes (Signed)
[facility] [place] NOTE  Patient Care Team: Tonia Ghent, [name], [name]  #  Oncology History Overview Note  # DEC 2020- LEFT BREAST-TWO PRIM ARIES:   #A]-IMC; ER/PR >90%; Her 2-IHC-2+; FISH-NEG-2 ; MRI- 3.7 CM   #B] Bx- IMC; G-1; ER/PR-pos; her2 NEG.   # FEB 18th 2021- s/p Lumpectomy [Dr.Wakefield]  #A] 3.7 cm; negative margins [? POSITIVE LATERAL MARGIN- awaiting review at [facility]];  # B] 8 mm; POSITIVE SUPERIOR MARGIN/ INFERIOR MARGIN-  Used estrogen and progesterone therapy: on premarin for post- menopausal x 2-3 years- discontinued in JAN 2021.   # SURVIVORSHIP:   DIAGNOSIS: Breast cancer  STAGE:   Stage II      ;  GOALS: Cure  CURRENT/MOST RECENT THERAPY :AC- T    Carcinoma of upper-outer quadrant of left breast in female, estrogen receptor positive ([place])  08/14/2019 Initial Diagnosis   Carcinoma of upper-outer quadrant of left breast in female, estrogen receptor positive ([place])   09/10/2019 Genetic Testing   Negative genetic testing on the STAT and common hereditary cancer panel.  The Common Hereditary Gene Panel offered by [facility] includes sequencing and/or deletion duplication testing of the following 48 genes: APC, ATM, AXIN2, BARD1, BMPR1A, BRCA1, BRCA2, BRIP1, CDH1, CDK4, CDKN2A (p14ARF), CDKN2A (p16INK4a), CHEK2, CTNNA1, DICER1, EPCAM (Deletion/duplication testing only), GREM1 (promoter region deletion/duplication testing only), KIT, MEN1, MLH1, MSH2, MSH3, MSH6, MUTYH, NBN, NF1, NHTL1, PALB2, PDGFRA, PMS2, POLD1, POLE, PTEN, RAD50, RAD51C, RAD51D, RNF43, SDHB, SDHC, SDHD, SMAD4, SMARCA4. STK11, TP53, TSC1, TSC2, and VHL.  The following genes were evaluated for sequence changes only: SDHA and HOXB13 c.251G>A variant only. The report date is September 10, 2019.   10/09/2019 -  Chemotherapy   The patient had DOXORUBICIN HCL CHEMO IV  INJECTION 2 MG/ML, 60 mg/m2, Intravenous,  Once, 0 of 4 cycles PALONOSETRON HCL INJECTION 0.25 MG/5ML, 0.25 mg, Intravenous,  Once, 0 of 4 cycles PEGFILGRASTIM-JMDB 6 MG/0.6ML [place] SOSY, 6 mg, Subcutaneous,  Once, 0 of 4 cycles CYCLOPHOSPHAMIDE CHEMO IV INFUSION ORDERABLE, 600 mg/m2, Intravenous,  Once, 0 of 4 cycles PACLITAXEL CHEMO IV INFUSION ORDERABLE (</= 80 MG/M2), 80 mg/m2, Intravenous,  Once, 0 of 12 cycles FOSAPREPITANT IV INFUSION 150 MG, 150 mg, Intravenous,  Once, 0 of 4 cycles  for chemotherapy treatment.       HISTORY OF PRESENTING ILLNESS:  Jessica Barnes 59 y.o.  female with recent diagnosis of breast cancer is here status post lumpectomy with sentinel lymph node biopsy for follow-up.  In the interim patient was evaluated by GI for elevated alkaline phosphatase.  Extensive imaging negative for any metastatic disease.   Patient continues to feel anxious.  Overall improved.  She is currently on Celexa.  Complains of pain in the underarm at the site of sentinel lymph nodes biopsy.  She is quite under stress given the Covid pandemic.   Review of Systems  Constitutional: Negative for chills, diaphoresis, fever, malaise/fatigue and weight loss.  HENT: Negative for nosebleeds and sore throat.   Eyes: Negative for double vision.  Respiratory: Negative for cough, hemoptysis, sputum production, shortness of breath and wheezing.   Cardiovascular: Negative for chest pain, palpitations, orthopnea and leg swelling.  Gastrointestinal: Negative for abdominal pain, blood in stool, constipation, diarrhea, heartburn, melena, nausea and vomiting.  Genitourinary: Negative for dysuria, frequency and urgency.  Musculoskeletal: Negative for back pain and joint pain.  Skin: Negative.  Negative for itching and rash.  Neurological: Positive for headaches. Negative for dizziness, tingling, focal weakness and weakness.  Endo/Heme/Allergies: Does not bruise/bleed easily.   Psychiatric/Behavioral: Positive for depression. The patient is nervous/anxious. The patient does not have insomnia.      MEDICAL HISTORY:  Past Medical History:  Diagnosis Date  . Anxiety 10/13/96  . Chest pain    atypical, hosp for that and depression 8/10-8/11/08  . COPD (chronic obstructive pulmonary disease) ([place]) 09/15/05   by x-ray  . Depression 10/13/96   hospital 8/10-8/11/08.  notable history: son with TBI after MVA  . Family history of breast cancer   . Family history of colon cancer   . Family history of pancreatic cancer   . Hyperlipidemia 09/15/98  . Hypothyroidism 01/14/96  . Migraine    27 yoa  . NSVD (normal spontaneous vaginal delivery)    x 1  . PONV (postoperative nausea and vomiting)   . PTSD (post-traumatic stress disorder)    likely PTSD after son's MVA/TBI in 2007, s/p counseling    SURGICAL HISTORY: Past Surgical History:  Procedure Laterality Date  . APPENDECTOMY     59 years of age  . BREAST BIOPSY Left 08/06/2019   [place] bx/ ribbon clip/ path pending  . BREAST BIOPSY Left 09/06/2019   [place] path pending heart clip  . BREAST LUMPECTOMY WITH RADIOACTIVE SEED AND SENTINEL LYMPH NODE BIOPSY Left 10/01/2019   Procedure: LEFT BREAST LUMPECTOMY WITH BRACKETED RADIOACTIVE SEEDS AND LEFT AXILLARY SENTINEL LYMPH NODE BIOPSY;  Surgeon: Rolm Bookbinder, MD;  Location: [facility];  Service: General;  Laterality: Left;    SOCIAL HISTORY: Social History   Socioeconomic History  . Marital status: Married    Spouse name: Not on file  . Number of children: Not on file  . Years of education: Not on file  . Highest education level: Not on file  Occupational History  . Not on file  Tobacco Use  . Smoking status: Former Smoker    Packs/day: 0.50    Years: 8.00    Pack years: 4.00    Types: Cigarettes  . Smokeless tobacco: Never Used  . Tobacco comment: quit 04/15/2014  Substance and Sexual Activity  . Alcohol use: No    Alcohol/week: 0.0 standard  drinks  . Drug use: No  . Sexual activity: Not on file  Other Topics Concern  . Not on file  Social History Narrative   Remarried, lives with husband 1 son, he had TBI and short term memory loss after prolonged illness      #Patient lives in [place] with her husband; prior history of smoking/currently vapes.  No significant alcohol use.  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    Social Determinants of Health   Financial Resource Strain:   . Difficulty of Paying Living Expenses: Not on file  Food Insecurity:   . Worried About Charity fundraiser in the Last Year: Not on file  . Ran Out of Food in the Last Year: Not on file  Transportation Needs:   . Lack of Transportation (Medical): Not on file  . Lack of Transportation (Non-Medical): Not on file  Physical Activity:   . Days of Exercise per Week: Not on file  . Minutes of Exercise per Session: Not on file  Stress:   . Feeling of Stress : Not on file  Social Connections:   . Frequency of Communication with Friends and Family: Not on file  . Frequency of Social  Gatherings with Friends and Family: Not on file  . Attends Religious Services: Not on file  . Active Member of Clubs or Organizations: Not on file  . Attends Archivist Meetings: Not on file  . Marital Status: Not on file  Intimate Partner Violence:   . Fear of Current or Ex-Partner: Not on file  . Emotionally Abused: Not on file  . Physically Abused: Not on file  . Sexually Abused: Not on file    FAMILY HISTORY: Family History  Problem Relation Age of Onset  . Hypertension Mother   . Colon cancer Father        d. 33  . Thyroid disease Sister   . Breast cancer Paternal Aunt   . Breast cancer Paternal Grandmother   . Colon cancer Paternal Grandmother   . Breast cancer Cousin        paternal aunt's daughter  . Colon cancer Paternal Uncle        3 pat uncles with colon cancer at unknown ages  . Pancreatic cancer Maternal Uncle        d. 64  .  Breast cancer Paternal Aunt     ALLERGIES:  is allergic to sulfamethoxazole-trimethoprim and codeine.  MEDICATIONS:  Current Outpatient Medications  Medication Sig Dispense Refill  . acetaminophen (TYLENOL) 500 MG tablet Take 1,000 mg by mouth every 6 (six) hours as needed for mild pain or headache.    . busPIRone (BUSPAR) 5 MG tablet Take 1-2 tablets (5-10 mg total) by mouth 2 (two) times daily. (Patient taking differently: Take 5 mg by mouth daily. ) 60 tablet 2  . butalbital-acetaminophen-caffeine (FIORICET) 50-325-40 MG tablet TAKE 1 TABLET BY MOUTH EVERY 4 TO 6 HOURS AS NEEDED FOR HEADACHE 90 tablet 3  . citalopram (CELEXA) 40 MG tablet Take 1 tablet (40 mg total) by mouth daily. 90 tablet 3  . dimenhyDRINATE (DRAMAMINE) 50 MG tablet Take 50 mg by mouth every 8 (eight) hours as needed.    Marland Kitchen ibuprofen (ADVIL) 100 MG tablet Take 100 mg by mouth every 6 (six) hours as needed for fever.    . levothyroxine (SYNTHROID) 88 MCG tablet TAKE 1 TABLET BY MOUTH DAILY EXCEPT ON SUNDAYS TAKE 1/2 TABLET 90 tablet 1  . diazepam (VALIUM) 10 MG tablet TAKE 1 TABLET BY MOUTH AT BEDTIME AS NEEDED FOR SLEEP 30 tablet 1  . lidocaine-prilocaine (EMLA) cream Apply 1 application topically as needed. 30 g 0  . loperamide (IMODIUM A-D) 2 MG tablet Take 4 mg by mouth 4 (four) times daily as needed for diarrhea or loose stools.    . ondansetron (ZOFRAN) 8 MG tablet One pill every 8 hours as needed for nausea/vomitting. 40 tablet 1  . prochlorperazine (COMPAZINE) 10 MG tablet Take 1 tablet (10 mg total) by mouth every 6 (six) hours as needed for nausea or vomiting. 40 tablet 1   No current facility-administered medications for this visit.      Marland Kitchen  PHYSICAL EXAMINATION: ECOG PERFORMANCE STATUS: 0 - Asymptomatic  Vitals:   10/08/19 1054  BP: 96/78  Pulse: 98  Resp: 20  Temp: (!) 97.5 F (36.4 C)   Filed Weights   10/08/19 1054  Weight: 134 lb (60.8 kg)    Physical Exam  Constitutional: She is  oriented to person, place, and time and well-developed, well-nourished, and in no distress.  HENT:  Head: Normocephalic and atraumatic.  Mouth/Throat: Oropharynx is clear and moist. No oropharyngeal exudate.  Eyes: Pupils are equal, round, and  reactive to light.  Cardiovascular: Normal rate and regular rhythm.  Pulmonary/Chest: Effort normal and breath sounds normal. No respiratory distress. She has no wheezes.  Abdominal: Soft. Bowel sounds are normal. She exhibits no distension and no mass. There is no abdominal tenderness. There is no rebound and no guarding.  Musculoskeletal:        General: No tenderness or edema. Normal range of motion.     Cervical back: Normal range of motion and neck supple.  Neurological: She is alert and oriented to person, place, and time.  Skin: Skin is warm.   left BREAST exam (in the presence of husband)-well-healing.'s Steri-Strips in place.  No concerns for seroma/infection.  Left underarm-Steri-Strips in place healing.  No signs of infection.   Psychiatric: Affect normal.  Anxious   LABORATORY DATA:  I have reviewed the data as listed Lab Results  Component Value Date   WBC 7.3 08/14/2019   HGB 12.3 08/14/2019   HCT 38.6 08/14/2019   MCV 93.7 08/14/2019   PLT 377 08/14/2019   Recent Labs    08/14/19 1544 09/06/19 1054  NA 138  --   K 3.4*  --   CL 103  --   CO2 25  --   GLUCOSE 101*  --   BUN 11  --   CREATININE 0.86  --   CALCIUM 8.8*  --   GFRNONAA >60  --   GFRAA >60  --   PROT 7.6 7.6  ALBUMIN 3.7 3.8  AST 21 27  ALT 17 18  ALKPHOS 171* 174*  BILITOT 0.4 0.4  BILIDIR  --  <0.1  IBILI  --  NOT CALCULATED    RADIOGRAPHIC STUDIES: I have personally reviewed the radiological images as listed and agreed with the findings in the report. MR Thoracic Spine W Wo Contrast  Result Date: 09/19/2019 CLINICAL DATA:  T3 bone lesion seen on bone scan. History of breast cancer. EXAM: MRI THORACIC WITHOUT AND WITH CONTRAST TECHNIQUE:  Multiplanar and multiecho pulse sequences of the thoracic spine were obtained without and with intravenous contrast. CONTRAST:  36m GADAVIST GADOBUTROL 1 MMOL/ML IV SOLN COMPARISON:  None. FINDINGS: MRI THORACIC SPINE FINDINGS Alignment:  Physiologic. Vertebrae: No fracture, evidence of discitis, or aggressive bone lesion. Small hemangioma in the superior posterior aspect of the T4 and T11 vertebral body. Cord:  Normal signal and morphology. Paraspinal and other soft tissues: Negative. Disc levels: Disc spaces: Degenerative disease with disc height loss at T2-3, T3-4 and T4-5. Broad-based disc bulge at C6-7 T1-T2: No disc protrusion, foraminal stenosis or central canal stenosis. T2-T3: No disc protrusion, foraminal stenosis or central canal stenosis. T3-T4: Tiny central disc protrusion. No foraminal or central canal stenosis. T4-T5: No disc protrusion, foraminal stenosis or central canal stenosis. T5-T6: No disc protrusion, foraminal stenosis or central canal stenosis. T6-T7: No disc protrusion, foraminal stenosis or central canal stenosis. T7-T8: No disc protrusion, foraminal stenosis or central canal stenosis. T8-T9: No disc protrusion, foraminal stenosis or central canal stenosis. T9-T10: No disc protrusion, foraminal stenosis or central canal stenosis. T10-T11: No disc protrusion, foraminal stenosis or central canal stenosis. T11-T12: Mild broad-based disc bulge. No foraminal or central canal stenosis. IMPRESSION: 1.  No acute osseous injury of the thoracic spine. 2. No aggressive osseous lesion to suggest metastatic disease. Electronically Signed   By: HKathreen Devoid  On: 09/19/2019 16:24   MR LIVER W WO CONTRAST  Result Date: 09/19/2019 CLINICAL DATA:  Recent diagnosis of left breast cancer. CT demonstrating  probable focal steatosis in the posterior aspect of segment 4. EXAM: MRI ABDOMEN WITHOUT AND WITH CONTRAST TECHNIQUE: Multiplanar multisequence MR imaging of the abdomen was performed both before and  after the administration of intravenous contrast. CONTRAST:  21m GADAVIST GADOBUTROL 1 MMOL/ML IV SOLN COMPARISON:  CT 08/30/2019 FINDINGS: Lower chest: Normal heart size without pericardial or pleural effusion. Hyperenhancing inferior left breast nodule of 1.4 cm on 6/11 is presumably the primary. Hepatobiliary: The finding on CT, in the posterior aspect of segment 4A, is consistent with marked focal steatosis, including on 14/8. This results in T2 hyperintensity on non-fat saturated image 14/3, with T2 hypointensity on fat saturated image 15/5. No restricted diffusion or other suspicious findings in this area. There is correlate vague hypoenhancement in this area, consistent with altered perfusion. Normal gallbladder, without biliary ductal dilatation. Pancreas:  Normal, without mass or ductal dilatation. Spleen:  Normal in size, without focal abnormality. Adrenals/Urinary Tract: Normal adrenal glands. Normal kidneys, without hydronephrosis. Stomach/Bowel: Normal stomach and abdominal bowel loops. Vascular/Lymphatic: Aortic atherosclerosis. No retroperitoneal or retrocrural adenopathy. Other:  No ascites.  No evidence of omental or peritoneal disease. Musculoskeletal: No acute osseous abnormality. IMPRESSION: 1. Posterior segment 4A focal marked steatosis, without suspicious liver lesion. 2.  Aortic Atherosclerosis (ICD10-I70.0). Electronically Signed   By: KAbigail MiyamotoM.D.   On: 09/19/2019 16:12   NM Sentinel Node Inj-No Rpt (Breast)  Result Date: 10/01/2019 Sulfur colloid was injected by the nuclear medicine technologist for melanoma sentinel node.   MM Breast Surgical Specimen  Result Date: 10/01/2019 CLINICAL DATA:  Left lumpectomy for invasive mammary carcinoma in 2 o'clock position of the left breast, 3 cm from the nipple, with a ribbon shaped clip and invasive mammary carcinoma in the left breast in the 2 o'clock retroareolar region with a heart shaped clip. EXAM: SPECIMEN RADIOGRAPHS OF THE LEFT  BREAST COMPARISON:  Previous exam(s). FINDINGS: Status post excision of the left breast. In one of the specimens, the ribbon shaped biopsy marker clip, radioactive seed and spiculated mass are demonstrated. In another specimen, the heart shaped clip is demonstrated. In a separate specimen cup, the 2nd radioactive seed is demonstrated. These are all intact. IMPRESSION: Specimen radiographs of the left breast. Electronically Signed   By: SClaudie ReveringM.D.   On: 10/01/2019 13:56   MM LT RADIOACTIVE SEED LOC MAMMO GUIDE  Result Date: 09/30/2019 CLINICAL DATA:  Biopsy proven invasive mammary carcinoma in the left breast at 2 o'clock 3 cm from the nipple (ribbon shaped clip) and biopsy proven invasive mammary carcinoma in the left breast at 2 o'clock subareolar region (heart shaped clip). EXAM: MAMMOGRAPHIC GUIDED RADIOACTIVE SEED LOCALIZATIONS OF THE LEFT BREAST COMPARISON:  Previous exam(s). FINDINGS: Patient presents for radioactive seed localization prior to surgery. I met with the patient and we discussed the procedure of seed localization including benefits and alternatives. We discussed the high likelihood of a successful procedure. We discussed the risks of the procedure including infection, bleeding, tissue injury and further surgery. We discussed the low dose of radioactivity involved in the procedure. Informed, written consent was given. The usual time-out protocol was performed immediately prior to the procedure. Using mammographic guidance, sterile technique, 1% lidocaine and an I-125 radioactive seed, ribbon shaped was localized using a lateral to medial approach. The follow-up mammogram images confirm the seed in the expected location and were marked for Dr. WDonne Hazel Follow-up survey of the patient confirms presence of the radioactive seed. Order number of I-125 seed:  [id number] Total activity:  3.299 millicuries reference Date: 09/26/2019 The patient tolerated the procedure well and was released  from the [facility]. She was given instructions regarding seed removal. Patient presents for radioactive seed localization prior to surgery. I met with the patient and we discussed the procedure of seed localization including benefits and alternatives. We discussed the high likelihood of a successful procedure. We discussed the risks of the procedure including infection, bleeding, tissue injury and further surgery. We discussed the low dose of radioactivity involved in the procedure. Informed, written consent was given. The usual time-out protocol was performed immediately prior to the procedure. Using mammographic guidance, sterile technique, 1% lidocaine and an I-125 radioactive seed, heart shaped was localized using a lateral to medial approach. The follow-up mammogram images confirm the seed in the expected location and were marked for Dr. Donne Hazel. Follow-up survey of the patient confirms presence of the radioactive seed. Order number of I-125 seed:  [id number]. Total activity:  6.222 millicuries reference Date: 09/26/2019 The patient tolerated the procedure well and was released from the [facility]. She was given instructions regarding seed removal. IMPRESSION: Radioactive seed localizations of the left breast. No apparent complications. Electronically Signed   By: Lillia Mountain M.D.   On: 09/30/2019 15:04   MM LT RAD SEED EA ADD LESION LOC MAMMO  Result Date: 09/30/2019 CLINICAL DATA:  Biopsy proven invasive mammary carcinoma in the left breast at 2 o'clock 3 cm from the nipple (ribbon shaped clip) and biopsy proven invasive mammary carcinoma in the left breast at 2 o'clock subareolar region (heart shaped clip). EXAM: MAMMOGRAPHIC GUIDED RADIOACTIVE SEED LOCALIZATIONS OF THE LEFT BREAST COMPARISON:  Previous exam(s). FINDINGS: Patient presents for radioactive seed localization prior to surgery. I met with the patient and we discussed the procedure of seed localization including benefits and  alternatives. We discussed the high likelihood of a successful procedure. We discussed the risks of the procedure including infection, bleeding, tissue injury and further surgery. We discussed the low dose of radioactivity involved in the procedure. Informed, written consent was given. The usual time-out protocol was performed immediately prior to the procedure. Using mammographic guidance, sterile technique, 1% lidocaine and an I-125 radioactive seed, ribbon shaped was localized using a lateral to medial approach. The follow-up mammogram images confirm the seed in the expected location and were marked for Dr. Donne Hazel. Follow-up survey of the patient confirms presence of the radioactive seed. Order number of I-125 seed:  [id number]. Total activity:  4.174 millicuries reference Date: 09/26/2019 The patient tolerated the procedure well and was released from the [facility]. She was given instructions regarding seed removal. Patient presents for radioactive seed localization prior to surgery. I met with the patient and we discussed the procedure of seed localization including benefits and alternatives. We discussed the high likelihood of a successful procedure. We discussed the risks of the procedure including infection, bleeding, tissue injury and further surgery. We discussed the low dose of radioactivity involved in the procedure. Informed, written consent was given. The usual time-out protocol was performed immediately prior to the procedure. Using mammographic guidance, sterile technique, 1% lidocaine and an I-125 radioactive seed, heart shaped was localized using a lateral to medial approach. The follow-up mammogram images confirm the seed in the expected location and were marked for Dr. Donne Hazel. Follow-up survey of the patient confirms presence of the radioactive seed. Order number of I-125 seed:  [id number]. Total activity:  6.314 millicuries reference Date: 09/26/2019 The patient tolerated the procedure  well and [facility] She was given instructions regarding seed removal. IMPRESSION: Radioactive seed localizations of the left breast. No apparent complications. Electronically Signed   By: Lillia Mountain M.D.   On: 09/30/2019 15:04    ASSESSMENT & PLAN:   Carcinoma of upper-outer quadrant of left breast in female, estrogen receptor positive ([place]) #Left breast cancer-2 separate primaries- A] T2N1-ER/PR positive HER-2 negative; ?  Lateral margin positive; B] T1N1 ER/PR positive HER-2 negative; superior/inferior positive margins [see discussion below regarding margin status]  #Given the stage II breast cancer-lymph node involvement I would recommend adjuvant chemotherapy.  Would recommend holding of axillary lymph node dissection; as patient be candidate for radiation.  #Recommend Adriamycin -Cytoxan followed by Taxol weekly.  Given patient's clinical status I would recommend AC every 3 weeks followed by Taxol weekly.  # Discussed the potential side effects including but not limited to-increasing fatigue, nausea vomiting, diarrhea, hair loss, sores in the mouth, increase risk of infection and also neuropathy. Also discussed regarding potential side effects of heart failure/leukemia with Adriamycin.  We will get a 2D echo.  #Margin status-discrepant as per review of pathology at [facility]; awaiting formal [facility] pathology consultation.  Discussed after final review, will discuss with Dr. Donne Hazel regarding next plan.  Patient will need a port.  #Elevated alkaline phosphatase-160-200s-MRI including thoracic spine/liver negative for malignancy.  S/p evaluation with GI.  Again recommended the patient to keep the appointment as planned with GI.   #Significant anxiety-currently stable on SSRI.   # DISPOSITION: anti-emetics/emla # Echo asap # chemo ed in 1 week/ # follow up TBD- Dr.B  All questions were answered. The patient/family knows to call the clinic with any problems, questions  or concerns.    Cammie Sickle, MD 10/09/2019 8:22 AM

## 2019-10-08 NOTE — Telephone Encounter (Signed)
Sent. Thanks.   

## 2019-10-09 NOTE — Progress Notes (Signed)
START ON PATHWAY REGIMEN - Breast     Cycles 1 through 4: A cycle is every 21 days:     Doxorubicin      Cyclophosphamide    Cycles 5 through 16: A cycle is every 7 days:     Paclitaxel   **Always confirm dose/schedule in your pharmacy ordering system**  Patient Characteristics: Postoperative without Neoadjuvant Therapy (Pathologic Staging), Invasive Disease, Adjuvant Therapy, HER2 Negative/Unknown/Equivocal, ER Positive, Node Positive, Node Positive (1-3), Genomic Testing Not Performed, Chemotherapy Candidate Therapeutic Status: Postoperative without Neoadjuvant Therapy (Pathologic Staging) AJCC Grade: G2 AJCC N Category: pN1a AJCC M Category: cM0 ER Status: Positive (+) AJCC 8 Stage Grouping: IB HER2 Status: Negative (-) Oncotype Dx Recurrence Score: Not Appropriate AJCC T Category: pT2 PR Status: Positive (+) Has this patient completed genomic testing<= No - Did Not Order Test  Intent of Therapy: Curative Intent, Discussed with Patient

## 2019-10-10 ENCOUNTER — Ambulatory Visit (INDEPENDENT_AMBULATORY_CARE_PROVIDER_SITE_OTHER): Payer: No Typology Code available for payment source | Admitting: Gastroenterology

## 2019-10-10 ENCOUNTER — Other Ambulatory Visit: Payer: Self-pay

## 2019-10-10 VITALS — BP 113/52 | HR 93 | Temp 98.1°F | Ht 63.0 in | Wt 134.0 lb

## 2019-10-10 DIAGNOSIS — R748 Abnormal levels of other serum enzymes: Secondary | ICD-10-CM

## 2019-10-10 NOTE — Progress Notes (Signed)
[name] MD, MRCP(U.K) [address]  [place]  [place], [place] [address]  Main: [phone]  Fax: [phone]   Primary Care Physician: Tonia Ghent, MD  Primary Gastroenterologist:  Dr. [name]   No chief complaint on file.   HPI: Jessica Barnes is a 59 y.o. female    Summary of history :  Initially referred and seen on 09/13/2019 for abnormal labs and IBS symptoms. She sees Dr Rogue Bussing at the cancer center for breast cancer.  Alkaline phosphatase was completely normal 1 year back but when recently checked about a month prior to his initial visit in 08/2019  was 171 with normal AST and ALT and total bilirubin.  INR was 0.9.  CBC demonstrated a hemoglobin of 12.3 g with a platelet count of 377 alkaline phosphatase isoenzymes were fractionated and were within normal limits as the individual components but elevated in total.  Gamma GT was elevated at 179.  CT scan of the abdomen pelvis in January 2021 demonstrated no metastatic disease in the abdomen.  Possible focal steatosis was noted.  Also mentioned that there is a strong concern for hepatic metastatic pre and postcontrast abdominal MRI could confirm this.  No family history or personal history of liver disease.No history of excess alcohol consumption. No illegal drug use or blood transfusion, no Armed forces logistics/support/administrative officer.   Started Celexa very recently. Primary care doctor Buspar since 1 week. No herbal medication or dietary supplements.   Takes Tyelnol for heahaches sometimes upto 9 tablets a day in addition to advil . She has seen neurologists for the same.    Interval history   09/13/2019-10/10/2019  09/19/2019: MRI liver- hepatic steatosis otherwise normal   Did not obtain liver work up that was ordered   She is doing well no complaints.  Takes Fioricet up to 6 tablets a day when she has migraines   Current Outpatient Medications  Medication Sig Dispense Refill  . acetaminophen (TYLENOL) 500 MG  tablet Take 1,000 mg by mouth every 6 (six) hours as needed for mild pain or headache.    . busPIRone (BUSPAR) 5 MG tablet Take 1-2 tablets (5-10 mg total) by mouth 2 (two) times daily. (Patient taking differently: Take 5 mg by mouth daily. ) 60 tablet 2  . butalbital-acetaminophen-caffeine (FIORICET) 50-325-40 MG tablet TAKE 1 TABLET BY MOUTH EVERY 4 TO 6 HOURS AS NEEDED FOR HEADACHE 90 tablet 3  . citalopram (CELEXA) 40 MG tablet Take 1 tablet (40 mg total) by mouth daily. 90 tablet 3  . diazepam (VALIUM) 10 MG tablet TAKE 1 TABLET BY MOUTH AT BEDTIME AS NEEDED FOR SLEEP 30 tablet 1  . dimenhyDRINATE (DRAMAMINE) 50 MG tablet Take 50 mg by mouth every 8 (eight) hours as needed.    Marland Kitchen ibuprofen (ADVIL) 100 MG tablet Take 100 mg by mouth every 6 (six) hours as needed for fever.    . levothyroxine (SYNTHROID) 88 MCG tablet TAKE 1 TABLET BY MOUTH DAILY EXCEPT ON SUNDAYS TAKE 1/2 TABLET 90 tablet 1  . lidocaine-prilocaine (EMLA) cream Apply 1 application topically as needed. 30 g 0  . loperamide (IMODIUM A-D) 2 MG tablet Take 4 mg by mouth 4 (four) times daily as needed for diarrhea or loose stools.    . ondansetron (ZOFRAN) 8 MG tablet One pill every 8 hours as needed for nausea/vomitting. 40 tablet 1  . prochlorperazine (COMPAZINE) 10 MG tablet Take 1 tablet (10 mg total) by mouth every 6 (six) hours as needed for  nausea or vomiting. 40 tablet 1   No current facility-administered medications for this visit.    Allergies as of 10/10/2019 - Review Complete 10/01/2019  Allergen Reaction Noted  . Sulfamethoxazole-trimethoprim  05/18/2009  . Codeine  03/29/2007    ROS:  General: Negative for anorexia, weight loss, fever, chills, fatigue, weakness. ENT: Negative for hoarseness, difficulty swallowing , nasal congestion. CV: Negative for chest pain, angina, palpitations, dyspnea on exertion, peripheral edema.  Respiratory: Negative for dyspnea at rest, dyspnea on exertion, cough, sputum, wheezing.    GI: See history of present illness. GU:  Negative for dysuria, hematuria, urinary incontinence, urinary frequency, nocturnal urination.  Endo: Negative for unusual weight change.    Physical Examination:   LMP 11/30/2011   General: Well-nourished, well-developed in no acute distress.  Eyes: No icterus. Conjunctivae pink. Psych: Alert and cooperative, normal mood and affect.   Imaging Studies: MR Thoracic Spine W Wo Contrast  Result Date: 09/19/2019 CLINICAL DATA:  T3 bone lesion seen on bone scan. History of breast cancer. EXAM: MRI THORACIC WITHOUT AND WITH CONTRAST TECHNIQUE: Multiplanar and multiecho pulse sequences of the thoracic spine were obtained without and with intravenous contrast. CONTRAST:  37m GADAVIST GADOBUTROL 1 MMOL/ML IV SOLN COMPARISON:  None. FINDINGS: MRI THORACIC SPINE FINDINGS Alignment:  Physiologic. Vertebrae: No fracture, evidence of discitis, or aggressive bone lesion. Small hemangioma in the superior posterior aspect of the T4 and T11 vertebral body. Cord:  Normal signal and morphology. Paraspinal and other soft tissues: Negative. Disc levels: Disc spaces: Degenerative disease with disc height loss at T2-3, T3-4 and T4-5. Broad-based disc bulge at C6-7 T1-T2: No disc protrusion, foraminal stenosis or central canal stenosis. T2-T3: No disc protrusion, foraminal stenosis or central canal stenosis. T3-T4: Tiny central disc protrusion. No foraminal or central canal stenosis. T4-T5: No disc protrusion, foraminal stenosis or central canal stenosis. T5-T6: No disc protrusion, foraminal stenosis or central canal stenosis. T6-T7: No disc protrusion, foraminal stenosis or central canal stenosis. T7-T8: No disc protrusion, foraminal stenosis or central canal stenosis. T8-T9: No disc protrusion, foraminal stenosis or central canal stenosis. T9-T10: No disc protrusion, foraminal stenosis or central canal stenosis. T10-T11: No disc protrusion, foraminal stenosis or central canal  stenosis. T11-T12: Mild broad-based disc bulge. No foraminal or central canal stenosis. IMPRESSION: 1.  No acute osseous injury of the thoracic spine. 2. No aggressive osseous lesion to suggest metastatic disease. Electronically Signed   By: HKathreen Devoid  On: 09/19/2019 16:24   MR LIVER W WO CONTRAST  Result Date: 09/19/2019 CLINICAL DATA:  Recent diagnosis of left breast cancer. CT demonstrating probable focal steatosis in the posterior aspect of segment 4. EXAM: MRI ABDOMEN WITHOUT AND WITH CONTRAST TECHNIQUE: Multiplanar multisequence MR imaging of the abdomen was performed both before and after the administration of intravenous contrast. CONTRAST:  647mGADAVIST GADOBUTROL 1 MMOL/ML IV SOLN COMPARISON:  CT 08/30/2019 FINDINGS: Lower chest: Normal heart size without pericardial or pleural effusion. Hyperenhancing inferior left breast nodule of 1.4 cm on 6/11 is presumably the primary. Hepatobiliary: The finding on CT, in the posterior aspect of segment 4A, is consistent with marked focal steatosis, including on 14/8. This results in T2 hyperintensity on non-fat saturated image 14/3, with T2 hypointensity on fat saturated image 15/5. No restricted diffusion or other suspicious findings in this area. There is correlate vague hypoenhancement in this area, consistent with altered perfusion. Normal gallbladder, without biliary ductal dilatation. Pancreas:  Normal, without mass or ductal dilatation. Spleen:  Normal in size, without  focal abnormality. Adrenals/Urinary Tract: Normal adrenal glands. Normal kidneys, without hydronephrosis. Stomach/Bowel: Normal stomach and abdominal bowel loops. Vascular/Lymphatic: Aortic atherosclerosis. No retroperitoneal or retrocrural adenopathy. Other:  No ascites.  No evidence of omental or peritoneal disease. Musculoskeletal: No acute osseous abnormality. IMPRESSION: 1. Posterior segment 4A focal marked steatosis, without suspicious liver lesion. 2.  Aortic Atherosclerosis  (ICD10-I70.0). Electronically Signed   By: Abigail Miyamoto M.D.   On: 09/19/2019 16:12   NM Sentinel Node Inj-No Rpt (Breast)  Result Date: 10/01/2019 Sulfur colloid was injected by the nuclear medicine technologist for melanoma sentinel node.   MM Breast Surgical Specimen  Result Date: 10/01/2019 CLINICAL DATA:  Left lumpectomy for invasive mammary carcinoma in 2 o'clock position of the left breast, 3 cm from the nipple, with a ribbon shaped clip and invasive mammary carcinoma in the left breast in the 2 o'clock retroareolar region with a heart shaped clip. EXAM: SPECIMEN RADIOGRAPHS OF THE LEFT BREAST COMPARISON:  Previous exam(s). FINDINGS: Status post excision of the left breast. In one of the specimens, the ribbon shaped biopsy marker clip, radioactive seed and spiculated mass are demonstrated. In another specimen, the heart shaped clip is demonstrated. In a separate specimen cup, the 2nd radioactive seed is demonstrated. These are all intact. IMPRESSION: Specimen radiographs of the left breast. Electronically Signed   By: Claudie Revering M.D.   On: 10/01/2019 13:56   MM LT RADIOACTIVE SEED LOC MAMMO GUIDE  Result Date: 09/30/2019 CLINICAL DATA:  Biopsy proven invasive mammary carcinoma in the left breast at 2 o'clock 3 cm from the nipple (ribbon shaped clip) and biopsy proven invasive mammary carcinoma in the left breast at 2 o'clock subareolar region (heart shaped clip). EXAM: MAMMOGRAPHIC GUIDED RADIOACTIVE SEED LOCALIZATIONS OF THE LEFT BREAST COMPARISON:  Previous exam(s). FINDINGS: Patient presents for radioactive seed localization prior to surgery. I met with the patient and we discussed the procedure of seed localization including benefits and alternatives. We discussed the high likelihood of a successful procedure. We discussed the risks of the procedure including infection, bleeding, tissue injury and further surgery. We discussed the low dose of radioactivity involved in the procedure.  Informed, written consent was given. The usual time-out protocol was performed immediately prior to the procedure. Using mammographic guidance, sterile technique, 1% lidocaine and an I-125 radioactive seed, ribbon shaped was localized using a lateral to medial approach. The follow-up mammogram images confirm the seed in the expected location and were marked for Dr. Donne Hazel. Follow-up survey of the patient confirms presence of the radioactive seed. Order number of I-125 seed:  [id number]. Total activity:  4.503 millicuries reference Date: 09/26/2019 The patient tolerated the procedure well and was released from the [facility]. She was given instructions regarding seed removal. Patient presents for radioactive seed localization prior to surgery. I met with the patient and we discussed the procedure of seed localization including benefits and alternatives. We discussed the high likelihood of a successful procedure. We discussed the risks of the procedure including infection, bleeding, tissue injury and further surgery. We discussed the low dose of radioactivity involved in the procedure. Informed, written consent was given. The usual time-out protocol was performed immediately prior to the procedure. Using mammographic guidance, sterile technique, 1% lidocaine and an I-125 radioactive seed, heart shaped was localized using a lateral to medial approach. The follow-up mammogram images confirm the seed in the expected location and were marked for Dr. Donne Hazel. Follow-up survey of the patient confirms presence of the radioactive seed. Order number of  I-125 seed:  [id number]. Total activity:  4.098 millicuries reference Date: 09/26/2019 The patient tolerated the procedure well and was released from the [facility]. She was given instructions regarding seed removal. IMPRESSION: Radioactive seed localizations of the left breast. No apparent complications. Electronically Signed   By: Lillia Mountain M.D.   On: 09/30/2019  15:04   MM LT RAD SEED EA ADD LESION LOC MAMMO  Result Date: 09/30/2019 CLINICAL DATA:  Biopsy proven invasive mammary carcinoma in the left breast at 2 o'clock 3 cm from the nipple (ribbon shaped clip) and biopsy proven invasive mammary carcinoma in the left breast at 2 o'clock subareolar region (heart shaped clip). EXAM: MAMMOGRAPHIC GUIDED RADIOACTIVE SEED LOCALIZATIONS OF THE LEFT BREAST COMPARISON:  Previous exam(s). FINDINGS: Patient presents for radioactive seed localization prior to surgery. I met with the patient and we discussed the procedure of seed localization including benefits and alternatives. We discussed the high likelihood of a successful procedure. We discussed the risks of the procedure including infection, bleeding, tissue injury and further surgery. We discussed the low dose of radioactivity involved in the procedure. Informed, written consent was given. The usual time-out protocol was performed immediately prior to the procedure. Using mammographic guidance, sterile technique, 1% lidocaine and an I-125 radioactive seed, ribbon shaped was localized using a lateral to medial approach. The follow-up mammogram images confirm the seed in the expected location and were marked for Dr. Donne Hazel. Follow-up survey of the patient confirms presence of the radioactive seed. Order number of I-125 seed:  [id number]. Total activity:  5.621 millicuries reference Date: 09/26/2019 The patient tolerated the procedure well and was released from the [facility]. She was given instructions regarding seed removal. Patient presents for radioactive seed localization prior to surgery. I met with the patient and we discussed the procedure of seed localization including benefits and alternatives. We discussed the high likelihood of a successful procedure. We discussed the risks of the procedure including infection, bleeding, tissue injury and further surgery. We discussed the low dose of radioactivity involved in  the procedure. Informed, written consent was given. The usual time-out protocol was performed immediately prior to the procedure. Using mammographic guidance, sterile technique, 1% lidocaine and an I-125 radioactive seed, heart shaped was localized using a lateral to medial approach. The follow-up mammogram images confirm the seed in the expected location and were marked for Dr. Donne Hazel. Follow-up survey of the patient confirms presence of the radioactive seed. Order number of I-125 seed:  [id number]. Total activity:  9.629 millicuries reference Date: 09/26/2019 The patient tolerated the procedure well and was released from the [facility]. She was given instructions regarding seed removal. IMPRESSION: Radioactive seed localizations of the left breast. No apparent complications. Electronically Signed   By: Lillia Mountain M.D.   On: 09/30/2019 15:04    Assessment and Plan:   Jessica Barnes is a 59 y.o. y/o female here to follow up  for elevated alkaline phosphatase levels which are isolated.  Normal transaminases.  This was completely normal about a year back.  Gamma GT is elevated suggesting that it is of liver etiology.  She has never had a full liver work-up for autoimmune and viral hepatitis.Was ordered last visit but not obtained. MRI only shows hepatic steatosis and no other abnormalities of the liver. There are a few spots on her ribs that have lit up on the Bone scan which may also contribute to the elevated Alk phos     Plan :   1. Obtain  full liver work-up for autoimmune and genetic disorders. 2. Cut down on Tyelenol  to no more than 3 tablets a day .     Dr [name]  MD,MRCP [facility]) Follow up in 2-3 weeks telephone visits

## 2019-10-13 LAB — PTH, INTACT AND CALCIUM
Calcium: 8.6 mg/dL — ABNORMAL LOW (ref 8.7–10.2)
PTH: 51 pg/mL (ref 15–65)

## 2019-10-13 LAB — HEPATIC FUNCTION PANEL
ALT: 21 IU/L (ref 0–32)
AST: 23 IU/L (ref 0–40)
Albumin: 4 g/dL (ref 3.8–4.9)
Alkaline Phosphatase: 200 IU/L — ABNORMAL HIGH (ref 39–117)
Bilirubin Total: <0.2 mg/dL (ref 0.0–1.2)
Bilirubin, Direct: 0.07 mg/dL (ref 0.00–0.40)
Total Protein: 6.5 g/dL (ref 6.0–8.5)

## 2019-10-13 LAB — IRON,TIBC AND FERRITIN PANEL
Ferritin: 81 ng/mL (ref 15–150)
Iron Saturation: 28 % (ref 15–55)
Iron: 74 ug/dL (ref 27–159)
Total Iron Binding Capacity: 265 ug/dL (ref 250–450)
UIBC: 191 ug/dL (ref 131–425)

## 2019-10-13 LAB — HEPATITIS B SURFACE ANTIBODY,QUALITATIVE: Hep B Surface Ab, Qual: NONREACTIVE

## 2019-10-13 LAB — CELIAC DISEASE AB SCREEN W/RFX
Antigliadin Abs, IgA: 3 units (ref 0–19)
Transglutaminase IgA: <2 U/mL (ref 0–3)

## 2019-10-13 LAB — IMMUNOGLOBULINS A/E/G/M, SERUM
IgA/Immunoglobulin A, Serum: 132 mg/dL (ref 87–352)
IgE (Immunoglobulin E), Serum: 30 IU/mL (ref 6–495)
IgG (Immunoglobin G), Serum: 956 mg/dL (ref 586–1602)
IgM (Immunoglobulin M), Srm: 54 mg/dL (ref 26–217)

## 2019-10-13 LAB — MITOCHONDRIAL/SMOOTH MUSCLE AB PNL
Mitochondrial Ab: <20 Units (ref 0.0–20.0)
Smooth Muscle Ab: 11 Units (ref 0–19)

## 2019-10-13 LAB — CERULOPLASMIN: Ceruloplasmin: 28.5 mg/dL (ref 19.0–39.0)

## 2019-10-13 LAB — ALPHA-1-ANTITRYPSIN: A-1 Antitrypsin: 137 mg/dL (ref 101–187)

## 2019-10-13 LAB — HEPATITIS A ANTIBODY, TOTAL: hep A Total Ab: NEGATIVE

## 2019-10-13 LAB — HEPATITIS B SURFACE ANTIGEN: Hepatitis B Surface Ag: NEGATIVE

## 2019-10-13 LAB — HIV ANTIBODY (ROUTINE TESTING W REFLEX): HIV Screen 4th Generation wRfx: NONREACTIVE

## 2019-10-13 LAB — HEPATITIS B E ANTIGEN: Hep B E Ag: NEGATIVE

## 2019-10-13 LAB — ANA: Anti Nuclear Antibody (ANA): NEGATIVE

## 2019-10-13 LAB — HEPATITIS B CORE ANTIBODY, TOTAL: Hep B Core Total Ab: NEGATIVE

## 2019-10-13 LAB — GAMMA GT: GGT: 290 IU/L — ABNORMAL HIGH (ref 0–60)

## 2019-10-13 LAB — HEPATITIS B E ANTIBODY: Hep B E Ab: NEGATIVE

## 2019-10-13 LAB — HEPATITIS C ANTIBODY: Hep C Virus Ab: <0.1 s/co ratio (ref 0.0–0.9)

## 2019-10-13 LAB — ANTI-MICROSOMAL ANTIBODY LIVER / KIDNEY: LKM1 Ab: 0.7 Units (ref 0.0–20.0)

## 2019-10-15 ENCOUNTER — Other Ambulatory Visit: Payer: Self-pay | Admitting: Internal Medicine

## 2019-10-15 ENCOUNTER — Encounter: Payer: Self-pay | Admitting: *Deleted

## 2019-10-15 DIAGNOSIS — C50412 Malignant neoplasm of upper-outer quadrant of left female breast: Secondary | ICD-10-CM

## 2019-10-15 DIAGNOSIS — Z5111 Encounter for antineoplastic chemotherapy: Secondary | ICD-10-CM

## 2019-10-15 DIAGNOSIS — Z170 Estrogen receptor positive status [ER+]: Secondary | ICD-10-CM

## 2019-10-15 NOTE — Progress Notes (Signed)
Patient's husband callled asked about patient's echo appointment.  Discussed with Dr. Rogue Bussing and team.  Patient scheduled for MUGA on 10/21/19 @ 12:00.  Patient informed of the her appointment.  She was also informed that Dr. Rogue Bussing has reached out to Dr. Donne Hazel  About putting in a port a cath but has not heard back from him.   Patient encouraged to check back with me if she has not heard about the port a cath by the first of next week.  She is agreeable.

## 2019-10-16 ENCOUNTER — Telehealth: Payer: Self-pay | Admitting: Internal Medicine

## 2019-10-16 NOTE — Telephone Encounter (Signed)
On 3/2-I spoke to patient's husband regarding the concerns.  MUGA scan is ordered sooner; also discussed that I am awaiting final recommendations from pathology/Dr. Donne Hazel surgeon regarding margin status.  Discussed that once I have further information-we will reach out regarding the next steps.

## 2019-10-17 ENCOUNTER — Inpatient Hospital Stay (HOSPITAL_BASED_OUTPATIENT_CLINIC_OR_DEPARTMENT_OTHER): Payer: No Typology Code available for payment source | Admitting: Nurse Practitioner

## 2019-10-17 ENCOUNTER — Inpatient Hospital Stay: Payer: No Typology Code available for payment source | Attending: Nurse Practitioner

## 2019-10-17 ENCOUNTER — Encounter: Payer: Self-pay | Admitting: Nurse Practitioner

## 2019-10-17 ENCOUNTER — Encounter: Payer: Self-pay | Admitting: *Deleted

## 2019-10-17 DIAGNOSIS — Z170 Estrogen receptor positive status [ER+]: Secondary | ICD-10-CM | POA: Insufficient documentation

## 2019-10-17 DIAGNOSIS — F4310 Post-traumatic stress disorder, unspecified: Secondary | ICD-10-CM | POA: Insufficient documentation

## 2019-10-17 DIAGNOSIS — F418 Other specified anxiety disorders: Secondary | ICD-10-CM | POA: Insufficient documentation

## 2019-10-17 DIAGNOSIS — C50412 Malignant neoplasm of upper-outer quadrant of left female breast: Secondary | ICD-10-CM

## 2019-10-17 DIAGNOSIS — J449 Chronic obstructive pulmonary disease, unspecified: Secondary | ICD-10-CM | POA: Insufficient documentation

## 2019-10-17 DIAGNOSIS — Z800 Family history of malignant neoplasm of digestive organs: Secondary | ICD-10-CM | POA: Insufficient documentation

## 2019-10-17 DIAGNOSIS — Z803 Family history of malignant neoplasm of breast: Secondary | ICD-10-CM | POA: Insufficient documentation

## 2019-10-17 DIAGNOSIS — E785 Hyperlipidemia, unspecified: Secondary | ICD-10-CM | POA: Insufficient documentation

## 2019-10-17 DIAGNOSIS — E039 Hypothyroidism, unspecified: Secondary | ICD-10-CM | POA: Insufficient documentation

## 2019-10-17 DIAGNOSIS — Z5111 Encounter for antineoplastic chemotherapy: Secondary | ICD-10-CM | POA: Insufficient documentation

## 2019-10-17 DIAGNOSIS — I700 Atherosclerosis of aorta: Secondary | ICD-10-CM | POA: Insufficient documentation

## 2019-10-17 DIAGNOSIS — Z87891 Personal history of nicotine dependence: Secondary | ICD-10-CM | POA: Insufficient documentation

## 2019-10-17 DIAGNOSIS — Z79899 Other long term (current) drug therapy: Secondary | ICD-10-CM | POA: Insufficient documentation

## 2019-10-17 NOTE — Progress Notes (Signed)
Patients husband called.  They have not heard from Dr. Melissa Montane office regarding her port a cath placement, or her final pathology results.  Dr. Rogue Bussing has reached out to Dr. Donne Hazel also, and recommends that the patient call Dr. Donne Hazel.  Husband infomed.  He is agreeable.

## 2019-10-17 NOTE — Progress Notes (Signed)
Virtual Visit Progress Note  [place] NOTE [place]  Telephone:([phone] [phone]  Patient Care Team: Tonia Ghent, [name], [name]   Name of the patient: Jessica Barnes  [id number]  [birth year]   Date of visit: 10/17/19  I connected with Jessica Barnes on 10/17/19 at 11:00 AM EST by video enabled telemedicine visit and verified that I am speaking with the correct person using two identifiers. We attempted video visit but due to poor signal were not able to keep video capabilities and converted to a telephone visit.   I discussed the limitations, risks, security and privacy concerns of performing an evaluation and management service by telemedicine and the availability of in-person appointments. I also discussed with the patient that there may be a patient responsible charge related to this service. The patient expressed understanding and agreed to proceed.   Other persons participating in the visit and their role in the encounter: patient's husband  Patient's location: home Provider's location: clinic  Diagnosis- Breast Cancer- 2 primaries  Chief complaint/Reason for visit- Initial Meeting for [facility], preparing for starting chemotherapy  Heme/Onc history:  Oncology History Overview Note  # DEC 2020- LEFT BREAST-TWO PRIM ARIES:   #A]-IMC; ER/PR >90%; Her 2-IHC-2+; FISH-NEG-2 ; MRI- 3.7 CM   #B] Bx- IMC; G-1; ER/PR-pos; her2 NEG.   # FEB 18th 2021- s/p Lumpectomy [Dr.Wakefield]  #A] 3.7 cm; negative margins [? POSITIVE LATERAL MARGIN- awaiting review at [facility]];  # B] 8 mm; POSITIVE SUPERIOR MARGIN/ INFERIOR MARGIN-  Used estrogen and progesterone therapy: on premarin for post- menopausal x 2-3 years- discontinued in JAN 2021.   # SURVIVORSHIP:   DIAGNOSIS: Breast cancer  STAGE:   Stage II      ;  GOALS: Cure  CURRENT/MOST  RECENT THERAPY :AC- T    Carcinoma of upper-outer quadrant of left breast in female, estrogen receptor positive ([place])  08/14/2019 Initial Diagnosis   Carcinoma of upper-outer quadrant of left breast in female, estrogen receptor positive ([place])   09/10/2019 Genetic Testing   Negative genetic testing on the STAT and common hereditary cancer panel.  The Common Hereditary Gene Panel offered by [facility] includes sequencing and/or deletion duplication testing of the following 48 genes: APC, ATM, AXIN2, BARD1, BMPR1A, BRCA1, BRCA2, BRIP1, CDH1, CDK4, CDKN2A (p14ARF), CDKN2A (p16INK4a), CHEK2, CTNNA1, DICER1, EPCAM (Deletion/duplication testing only), GREM1 (promoter region deletion/duplication testing only), KIT, MEN1, MLH1, MSH2, MSH3, MSH6, MUTYH, NBN, NF1, NHTL1, PALB2, PDGFRA, PMS2, POLD1, POLE, PTEN, RAD50, RAD51C, RAD51D, RNF43, SDHB, SDHC, SDHD, SMAD4, SMARCA4. STK11, TP53, TSC1, TSC2, and VHL.  The following genes were evaluated for sequence changes only: SDHA and HOXB13 c.251G>A variant only. The report date is September 10, 2019.   10/03/2019 Cancer Staging   Staging form: Breast, AJCC 8th Edition - Pathologic stage from 10/03/2019: Stage IB (pT2, pN1a, cM0, G2, ER+, PR+, HER2-) - Signed by Gardenia Phlegm, NP on 10/16/2019   10/09/2019 -  Chemotherapy   The patient had DOXORUBICIN HCL CHEMO IV INJECTION 2 MG/ML, 60 mg/m2, Intravenous,  Once, 0 of 4 cycles PALONOSETRON HCL INJECTION 0.25 MG/5ML, 0.25 mg, Intravenous,  Once, 0 of 4 cycles PEGFILGRASTIM-JMDB 6 MG/0.6ML [place] SOSY, 6 mg, Subcutaneous,  Once, 0 of 4 cycles CYCLOPHOSPHAMIDE CHEMO IV INFUSION ORDERABLE, 600 mg/m2, Intravenous,  Once, 0 of 4 cycles PACLITAXEL CHEMO IV INFUSION ORDERABLE (</= 80 MG/M2), 80 mg/m2, Intravenous,  Once, 0  of 12 cycles FOSAPREPITANT IV INFUSION 150 MG, 150 mg, Intravenous,  Once, 0 of 4 cycles  for chemotherapy treatment.      Interval history-  Jessica Barnes, 59 year old, female, who presents to  chemo care clinic today for initial meeting in preparation for starting chemotherapy. I introduced the chemo care clinic and we discussed that the role of the clinic is to assist those who are at an increased risk of emergency room visits and/or complications during the course of chemotherapy treatment. We discussed that the increased risk takes into account factors such as age, performance status, and co-morbidities. We also discussed that for some, this might include barriers to care such as not having a primary care provider, lack of insurance/transportation, or not being able to afford medications. We discussed that the goal of the program is to help prevent unplanned ER visits and help reduce complications during chemotherapy. We do this by discussing specific risk factors to each individual and identifying ways that we can help improve these risk factors and reduce barriers to care.   Emotionally 'all over the place'. Physically fatigued. Previously took prozac for some time. Switched to celexa and buspar at diagnosis. Didn't feel improvement and switched back to prozac with valium prn for significant anxiety and/or sleep.   ECOG FS:1 - Symptomatic but completely ambulatory  Review of systems- Review of Systems  Constitutional: Positive for malaise/fatigue. Negative for chills, fever and weight loss.  HENT: Negative for hearing loss, nosebleeds, sore throat and tinnitus.   Eyes: Negative for blurred vision and double vision.  Respiratory: Negative for cough, hemoptysis, shortness of breath and wheezing.   Cardiovascular: Negative for chest pain, palpitations and leg swelling.  Gastrointestinal: Negative for abdominal pain, blood in stool, constipation, diarrhea, melena, nausea and vomiting.  Genitourinary: Negative for dysuria and urgency.  Musculoskeletal: Negative for back pain, falls, joint pain and myalgias.  Skin: Negative for itching and rash.  Neurological: Negative for dizziness,  tingling, sensory change, loss of consciousness, weakness and headaches.  Endo/Heme/Allergies: Negative for environmental allergies. Does not bruise/bleed easily.  Psychiatric/Behavioral: Positive for depression. The patient is nervous/anxious and has insomnia.      Allergies  Allergen Reactions  . Sulfamethoxazole-Trimethoprim     Lip and tongue edema  . Codeine     GI upset    Past Medical History:  Diagnosis Date  . Anxiety 10/13/96  . Chest pain    atypical, hosp for that and depression 8/10-8/11/08  . COPD (chronic obstructive pulmonary disease) ([place]) 09/15/05   by x-ray  . Depression 10/13/96   hospital 8/10-8/11/08.  notable history: son with TBI after MVA  . Family history of breast cancer   . Family history of colon cancer   . Family history of pancreatic cancer   . Hyperlipidemia 09/15/98  . Hypothyroidism 01/14/96  . Migraine    27 yoa  . NSVD (normal spontaneous vaginal delivery)    x 1  . PONV (postoperative nausea and vomiting)   . PTSD (post-traumatic stress disorder)    likely PTSD after son's MVA/TBI in 2007, s/p counseling    Past Surgical History:  Procedure Laterality Date  . APPENDECTOMY     59 years of age  . BREAST BIOPSY Left 08/06/2019   [place] bx/ ribbon clip/ path pending  . BREAST BIOPSY Left 09/06/2019   [place] path pending heart clip  . BREAST LUMPECTOMY WITH RADIOACTIVE SEED AND SENTINEL LYMPH NODE BIOPSY Left 10/01/2019   Procedure: LEFT BREAST LUMPECTOMY  WITH BRACKETED RADIOACTIVE SEEDS AND LEFT AXILLARY SENTINEL LYMPH NODE BIOPSY;  Surgeon: Rolm Bookbinder, MD;  Location: [facility];  Service: General;  Laterality: Left;    Social History   Socioeconomic History  . Marital status: Married    Spouse name: Not on file  . Number of children: Not on file  . Years of education: Not on file  . Highest education level: Not on file  Occupational History  . Not on file  Tobacco Use  . Smoking status: Former Smoker    Packs/day: 0.50      Years: 8.00    Pack years: 4.00    Types: Cigarettes  . Smokeless tobacco: Never Used  . Tobacco comment: quit 04/15/2014  Substance and Sexual Activity  . Alcohol use: No    Alcohol/week: 0.0 standard drinks  . Drug use: No  . Sexual activity: Not on file  Other Topics Concern  . Not on file  Social History Narrative   Remarried, lives with husband 1 son, he had TBI and short term memory loss after prolonged illness      #Patient lives in [place] with her husband; prior history of smoking/currently vapes.  No significant alcohol use.  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    Social Determinants of Health   Financial Resource Strain:   . Difficulty of Paying Living Expenses: Not on file  Food Insecurity:   . Worried About Charity fundraiser in the Last Year: Not on file  . Ran Out of Food in the Last Year: Not on file  Transportation Needs:   . Lack of Transportation (Medical): Not on file  . Lack of Transportation (Non-Medical): Not on file  Physical Activity:   . Days of Exercise per Week: Not on file  . Minutes of Exercise per Session: Not on file  Stress:   . Feeling of Stress : Not on file  Social Connections:   . Frequency of Communication with Friends and Family: Not on file  . Frequency of Social Gatherings with Friends and Family: Not on file  . Attends Religious Services: Not on file  . Active Member of Clubs or Organizations: Not on file  . Attends Archivist Meetings: Not on file  . Marital Status: Not on file  Intimate Partner Violence:   . Fear of Current or Ex-Partner: Not on file  . Emotionally Abused: Not on file  . Physically Abused: Not on file  . Sexually Abused: Not on file    Family History  Problem Relation Age of Onset  . Hypertension Mother   . Colon cancer Father        d. 58  . Thyroid disease Sister   . Breast cancer Paternal Aunt   . Breast cancer Paternal Grandmother   . Colon cancer Paternal Grandmother   . Breast  cancer Cousin        paternal aunt's daughter  . Colon cancer Paternal Uncle        3 pat uncles with colon cancer at unknown ages  . Pancreatic cancer Maternal Uncle        d. 11  . Breast cancer Paternal Aunt     Current Outpatient Medications:  .  acetaminophen (TYLENOL) 500 MG tablet, Take 1,000 mg by mouth every 6 (six) hours as needed for mild pain or headache., Disp: , Rfl:  .  busPIRone (BUSPAR) 5 MG tablet, Take 1-2 tablets (5-10 mg total) by mouth 2 (two) times daily. (  Patient taking differently: Take 5 mg by mouth daily. ), Disp: 60 tablet, Rfl: 2 .  butalbital-acetaminophen-caffeine (FIORICET) 50-325-40 MG tablet, TAKE 1 TABLET BY MOUTH EVERY 4 TO 6 HOURS AS NEEDED FOR HEADACHE, Disp: 90 tablet, Rfl: 3 .  citalopram (CELEXA) 40 MG tablet, Take 1 tablet (40 mg total) by mouth daily., Disp: 90 tablet, Rfl: 3 .  diazepam (VALIUM) 10 MG tablet, TAKE 1 TABLET BY MOUTH AT BEDTIME AS NEEDED FOR SLEEP, Disp: 30 tablet, Rfl: 1 .  dimenhyDRINATE (DRAMAMINE) 50 MG tablet, Take 50 mg by mouth every 8 (eight) hours as needed., Disp: , Rfl:  .  ibuprofen (ADVIL) 100 MG tablet, Take 100 mg by mouth every 6 (six) hours as needed for fever., Disp: , Rfl:  .  levothyroxine (SYNTHROID) 88 MCG tablet, TAKE 1 TABLET BY MOUTH DAILY EXCEPT ON SUNDAYS TAKE 1/2 TABLET, Disp: 90 tablet, Rfl: 1 .  lidocaine-prilocaine (EMLA) cream, Apply 1 application topically as needed., Disp: 30 g, Rfl: 0 .  loperamide (IMODIUM A-D) 2 MG tablet, Take 4 mg by mouth 4 (four) times daily as needed for diarrhea or loose stools., Disp: , Rfl:  .  ondansetron (ZOFRAN) 8 MG tablet, One pill every 8 hours as needed for nausea/vomitting., Disp: 40 tablet, Rfl: 1 .  prochlorperazine (COMPAZINE) 10 MG tablet, Take 1 tablet (10 mg total) by mouth every 6 (six) hours as needed for nausea or vomiting., Disp: 40 tablet, Rfl: 1  Physical exam: exam limited due to telemedicine  CMP Latest Ref Rng & Units 10/10/2019  Glucose 70 - 99  mg/dL -  BUN 6 - 20 mg/dL -  Creatinine 0.44 - 1.00 mg/dL -  Sodium 135 - 145 mmol/L -  Potassium 3.5 - 5.1 mmol/L -  Chloride 98 - 111 mmol/L -  CO2 22 - 32 mmol/L -  Calcium 8.7 - 10.2 mg/dL 8.6(L)  Total Protein 6.0 - 8.5 g/dL 6.5  Total Bilirubin 0.0 - 1.2 mg/dL <0.2  Alkaline Phos 39 - 117 IU/L 200(H)  AST 0 - 40 IU/L 23  ALT 0 - 32 IU/L 21   CBC Latest Ref Rng & Units 08/14/2019  WBC 4.0 - 10.5 K/uL 7.3  Hemoglobin 12.0 - 15.0 g/dL 12.3  Hematocrit 36.0 - 46.0 % 38.6  Platelets 150 - 400 K/uL 377   No images are attached to the encounter.  MR Thoracic Spine W Wo Contrast  Result Date: 09/19/2019 CLINICAL DATA:  T3 bone lesion seen on bone scan. History of breast cancer. EXAM: MRI THORACIC WITHOUT AND WITH CONTRAST TECHNIQUE: Multiplanar and multiecho pulse sequences of the thoracic spine were obtained without and with intravenous contrast. CONTRAST:  68m GADAVIST GADOBUTROL 1 MMOL/ML IV SOLN COMPARISON:  None. FINDINGS: MRI THORACIC SPINE FINDINGS Alignment:  Physiologic. Vertebrae: No fracture, evidence of discitis, or aggressive bone lesion. Small hemangioma in the superior posterior aspect of the T4 and T11 vertebral body. Cord:  Normal signal and morphology. Paraspinal and other soft tissues: Negative. Disc levels: Disc spaces: Degenerative disease with disc height loss at T2-3, T3-4 and T4-5. Broad-based disc bulge at C6-7 T1-T2: No disc protrusion, foraminal stenosis or central canal stenosis. T2-T3: No disc protrusion, foraminal stenosis or central canal stenosis. T3-T4: Tiny central disc protrusion. No foraminal or central canal stenosis. T4-T5: No disc protrusion, foraminal stenosis or central canal stenosis. T5-T6: No disc protrusion, foraminal stenosis or central canal stenosis. T6-T7: No disc protrusion, foraminal stenosis or central canal stenosis. T7-T8: No disc protrusion, foraminal stenosis or  central canal stenosis. T8-T9: No disc protrusion, foraminal stenosis or  central canal stenosis. T9-T10: No disc protrusion, foraminal stenosis or central canal stenosis. T10-T11: No disc protrusion, foraminal stenosis or central canal stenosis. T11-T12: Mild broad-based disc bulge. No foraminal or central canal stenosis. IMPRESSION: 1.  No acute osseous injury of the thoracic spine. 2. No aggressive osseous lesion to suggest metastatic disease. Electronically Signed   By: Kathreen Devoid   On: 09/19/2019 16:24   MR LIVER W WO CONTRAST  Result Date: 09/19/2019 CLINICAL DATA:  Recent diagnosis of left breast cancer. CT demonstrating probable focal steatosis in the posterior aspect of segment 4. EXAM: MRI ABDOMEN WITHOUT AND WITH CONTRAST TECHNIQUE: Multiplanar multisequence MR imaging of the abdomen was performed both before and after the administration of intravenous contrast. CONTRAST:  12m GADAVIST GADOBUTROL 1 MMOL/ML IV SOLN COMPARISON:  CT 08/30/2019 FINDINGS: Lower chest: Normal heart size without pericardial or pleural effusion. Hyperenhancing inferior left breast nodule of 1.4 cm on 6/11 is presumably the primary. Hepatobiliary: The finding on CT, in the posterior aspect of segment 4A, is consistent with marked focal steatosis, including on 14/8. This results in T2 hyperintensity on non-fat saturated image 14/3, with T2 hypointensity on fat saturated image 15/5. No restricted diffusion or other suspicious findings in this area. There is correlate vague hypoenhancement in this area, consistent with altered perfusion. Normal gallbladder, without biliary ductal dilatation. Pancreas:  Normal, without mass or ductal dilatation. Spleen:  Normal in size, without focal abnormality. Adrenals/Urinary Tract: Normal adrenal glands. Normal kidneys, without hydronephrosis. Stomach/Bowel: Normal stomach and abdominal bowel loops. Vascular/Lymphatic: Aortic atherosclerosis. No retroperitoneal or retrocrural adenopathy. Other:  No ascites.  No evidence of omental or peritoneal disease.  Musculoskeletal: No acute osseous abnormality. IMPRESSION: 1. Posterior segment 4A focal marked steatosis, without suspicious liver lesion. 2.  Aortic Atherosclerosis (ICD10-I70.0). Electronically Signed   By: KAbigail MiyamotoM.D.   On: 09/19/2019 16:12   NM Sentinel Node Inj-No Rpt (Breast)  Result Date: 10/01/2019 Sulfur colloid was injected by the nuclear medicine technologist for melanoma sentinel node.   MM Breast Surgical Specimen  Result Date: 10/01/2019 CLINICAL DATA:  Left lumpectomy for invasive mammary carcinoma in 2 o'clock position of the left breast, 3 cm from the nipple, with a ribbon shaped clip and invasive mammary carcinoma in the left breast in the 2 o'clock retroareolar region with a heart shaped clip. EXAM: SPECIMEN RADIOGRAPHS OF THE LEFT BREAST COMPARISON:  Previous exam(s). FINDINGS: Status post excision of the left breast. In one of the specimens, the ribbon shaped biopsy marker clip, radioactive seed and spiculated mass are demonstrated. In another specimen, the heart shaped clip is demonstrated. In a separate specimen cup, the 2nd radioactive seed is demonstrated. These are all intact. IMPRESSION: Specimen radiographs of the left breast. Electronically Signed   By: SClaudie ReveringM.D.   On: 10/01/2019 13:56   MM LT RADIOACTIVE SEED LOC MAMMO GUIDE  Result Date: 09/30/2019 CLINICAL DATA:  Biopsy proven invasive mammary carcinoma in the left breast at 2 o'clock 3 cm from the nipple (ribbon shaped clip) and biopsy proven invasive mammary carcinoma in the left breast at 2 o'clock subareolar region (heart shaped clip). EXAM: MAMMOGRAPHIC GUIDED RADIOACTIVE SEED LOCALIZATIONS OF THE LEFT BREAST COMPARISON:  Previous exam(s). FINDINGS: Patient presents for radioactive seed localization prior to surgery. I met with the patient and we discussed the procedure of seed localization including benefits and alternatives. We discussed the high likelihood of a successful procedure. We discussed the  risks of the procedure including infection, bleeding, tissue injury and further surgery. We discussed the low dose of radioactivity involved in the procedure. Informed, written consent was given. The usual time-out protocol was performed immediately prior to the procedure. Using mammographic guidance, sterile technique, 1% lidocaine and an I-125 radioactive seed, ribbon shaped was localized using a lateral to medial approach. The follow-up mammogram images confirm the seed in the expected location and were marked for Dr. Donne Hazel. Follow-up survey of the patient confirms presence of the radioactive seed. Order number of I-125 seed:  [id number]. Total activity:  0.932 millicuries reference Date: 09/26/2019 The patient tolerated the procedure well and was released from the [facility]. She was given instructions regarding seed removal. Patient presents for radioactive seed localization prior to surgery. I met with the patient and we discussed the procedure of seed localization including benefits and alternatives. We discussed the high likelihood of a successful procedure. We discussed the risks of the procedure including infection, bleeding, tissue injury and further surgery. We discussed the low dose of radioactivity involved in the procedure. Informed, written consent was given. The usual time-out protocol was performed immediately prior to the procedure. Using mammographic guidance, sterile technique, 1% lidocaine and an I-125 radioactive seed, heart shaped was localized using a lateral to medial approach. The follow-up mammogram images confirm the seed in the expected location and were marked for Dr. Donne Hazel. Follow-up survey of the patient confirms presence of the radioactive seed. Order number of I-125 seed:  [id number]. Total activity:  5.427 millicuries reference Date: 09/26/2019 The patient tolerated the procedure well and was released from the [facility]. She was given instructions regarding seed  removal. IMPRESSION: Radioactive seed localizations of the left breast. No apparent complications. Electronically Signed   By: Lillia Mountain M.D.   On: 09/30/2019 15:04   MM LT RAD SEED EA ADD LESION LOC MAMMO  Result Date: 09/30/2019 CLINICAL DATA:  Biopsy proven invasive mammary carcinoma in the left breast at 2 o'clock 3 cm from the nipple (ribbon shaped clip) and biopsy proven invasive mammary carcinoma in the left breast at 2 o'clock subareolar region (heart shaped clip). EXAM: MAMMOGRAPHIC GUIDED RADIOACTIVE SEED LOCALIZATIONS OF THE LEFT BREAST COMPARISON:  Previous exam(s). FINDINGS: Patient presents for radioactive seed localization prior to surgery. I met with the patient and we discussed the procedure of seed localization including benefits and alternatives. We discussed the high likelihood of a successful procedure. We discussed the risks of the procedure including infection, bleeding, tissue injury and further surgery. We discussed the low dose of radioactivity involved in the procedure. Informed, written consent was given. The usual time-out protocol was performed immediately prior to the procedure. Using mammographic guidance, sterile technique, 1% lidocaine and an I-125 radioactive seed, ribbon shaped was localized using a lateral to medial approach. The follow-up mammogram images confirm the seed in the expected location and were marked for Dr. Donne Hazel. Follow-up survey of the patient confirms presence of the radioactive seed. Order number of I-125 seed:  [id number]. Total activity:  1.517 millicuries reference Date: 09/26/2019 The patient tolerated the procedure well and was released from the [facility]. She was given instructions regarding seed removal. Patient presents for radioactive seed localization prior to surgery. I met with the patient and we discussed the procedure of seed localization including benefits and alternatives. We discussed the high likelihood of a successful procedure.  We discussed the risks of the procedure including infection, bleeding, tissue injury and further surgery. We discussed the low  dose of radioactivity involved in the procedure. Informed, written consent was given. The usual time-out protocol was performed immediately prior to the procedure. Using mammographic guidance, sterile technique, 1% lidocaine and an I-125 radioactive seed, heart shaped was localized using a lateral to medial approach. The follow-up mammogram images confirm the seed in the expected location and were marked for Dr. Donne Hazel. Follow-up survey of the patient confirms presence of the radioactive seed. Order number of I-125 seed:  [id number]. Total activity:  4.680 millicuries reference Date: 09/26/2019 The patient tolerated the procedure well and was released from the [facility]. She was given instructions regarding seed removal. IMPRESSION: Radioactive seed localizations of the left breast. No apparent complications. Electronically Signed   By: Lillia Mountain M.D.   On: 09/30/2019 15:04    Assessment and plan- Patient is a 59 y.o. female who presents to [facility] for initial meeting in preparation for starting chemotherapy for the treatment of breast cancer.  1. Left breast cancer-2 separate primaries; a) T2N1 ER/PR positive, her-2 negative, awaiting margin status from surgery, may need re-excision. B) T1N1 ER/PR positive, her-2 negative superior/inferior positive margins. Given stage II breast cancer with lymph node involvement, adjuvant chemotherapy was recommended with Adriamycin and Cytoxan followed by weekly Taxol.  Patient will need port for administration of chemotherapy.  We again reviewed the potential side effects including fatigue, nausea, vomiting, diarrhea, hair loss, mouth sores, increased risk of infection, peripheral neuropathy.  Also discussed risks of Adriamycin including heart failure, leukemia.  Awaiting MUGA scan. She spoke to surgeon during our call today.    She has not yet had port placed. Awaiting scheduling from surgery. Also has not yet had chemo education class. I briefly discussed what this class would entail and she will get it rescheduled.   2. Chemo Care Clinic/Risk for ER/Hospitalization during chemotherapy- We discussed the role of the chemo care clinic and identified patient specific risk factors. I discussed that patient was identified as low risk which was primarily based on previous hospitalizations which were secondary to her diagnosis.  She has a PCP whom she sees regularly.  She identifies as being in a relationship.  She has insurance.  She has had significant anxiety which is worse since her diagnosis.  She started Celexa recently and BuSpar with Valium for breakthrough anxiety/panic.  3. Social Determinants of Health- we discussed that social determinants of health may have significant impacts on health and outcomes for cancer patients.  Today we discussed specific social determinants of performance status, alcohol use, depression, financial needs, food insecurity, housing, interpersonal violence, social connections, stress, tobacco use, and transportation.  After lengthy discussion the following were identified as areas of need:    Based on depression: we discussed self-referral to [facility] Counseling services. She is currently taking medications for anxiety which are managed by medical-oncology. Could also consider psychiatry for med management or palliative care/symptom management as well as primary care providers.   Based on lack of social connections-we discussed options for support groups at the cancer center. If interested, please notify nurse navigator to enroll. Has friends to talk with who are going through cancer journey currently and doesn't feel she needs this currently. Cites covid as contributing to social isolation.   Based on concern of stress-we discussed options for managing stress including healthy eating,  exercise as well as participating in no charge counseling services at the cancer center and support groups.  If these are of interest, patient can notify either myself or primary  nursing team.  Based on tobacco use- she is a former smoker but does use vape with nicotine. smoking cessation was encouraged.  We discussed options for management including medications and referral to quit Smart program. Can also consider nicotine replacement vs pharmacotherapy with PCP.   We also discussed the role of the Symptom Management Clinic at [facility] for acute issues and methods of contacting clinic/provider. She denies needing specific assistance at this time and She will be followed by Tanya Nones, RN (Nurse Navigator).   Return to clinic  Follow up appointments currently on hold awaiting port placement, chemo ed, muga scan, and pathology clarification. Dr. Aletha Halim team managing.   Visit Diagnosis 1. Carcinoma of upper-outer quadrant of left breast in female, estrogen receptor positive ([place])    I discussed the assessment and treatment plan with the patient. The patient was provided an opportunity to ask questions and all were answered. The patient agreed with the plan and demonstrated an understanding of the instructions.   The patient was advised to call back or seek an in-person evaluation if the symptoms worsen or if the condition fails to improve as anticipated.   I provided 35 minutes of face-to-face video visit time during this encounter, and > 50% was spent counseling as documented under my assessment & plan. We attempted video visit but due to poor signal were not able to keep video capabilities and converted to a telephone visit for the duration of the call.   Beckey Rutter, DNP, AGNP-C [place] at [facility] [phone] (clinic)  CC: Tanya Nones, RN, Dr. Rogue Bussing

## 2019-10-18 ENCOUNTER — Other Ambulatory Visit: Payer: Self-pay | Admitting: General Surgery

## 2019-10-18 ENCOUNTER — Encounter: Payer: Self-pay | Admitting: *Deleted

## 2019-10-18 NOTE — Progress Notes (Signed)
Informed patient of her appointment for chemo class on Monday 10/21/19 @ 9:00.   Talked and answered questions.  Offered support.

## 2019-10-21 ENCOUNTER — Encounter
Admission: RE | Admit: 2019-10-21 | Discharge: 2019-10-21 | Disposition: A | Discharge status: Home / Self Care | Payer: No Typology Code available for payment source | Source: Ambulatory Visit | Attending: Internal Medicine | Admitting: Internal Medicine

## 2019-10-21 ENCOUNTER — Encounter (HOSPITAL_BASED_OUTPATIENT_CLINIC_OR_DEPARTMENT_OTHER): Payer: Self-pay | Admitting: General Surgery

## 2019-10-21 ENCOUNTER — Other Ambulatory Visit: Payer: Self-pay

## 2019-10-21 ENCOUNTER — Inpatient Hospital Stay: Payer: No Typology Code available for payment source

## 2019-10-21 DIAGNOSIS — Z5111 Encounter for antineoplastic chemotherapy: Secondary | ICD-10-CM | POA: Diagnosis not present

## 2019-10-21 DIAGNOSIS — C50412 Malignant neoplasm of upper-outer quadrant of left female breast: Secondary | ICD-10-CM

## 2019-10-21 DIAGNOSIS — Z170 Estrogen receptor positive status [ER+]: Secondary | ICD-10-CM | POA: Diagnosis not present

## 2019-10-21 HISTORY — PX: BREAST LUMPECTOMY: SHX2

## 2019-10-21 LAB — SURGICAL PATHOLOGY

## 2019-10-21 MED ORDER — TECHNETIUM TC 99M-LABELED RED BLOOD CELLS IV KIT
20.0000 | PACK | Freq: Once | INTRAVENOUS | Status: AC | PRN
  Administered 2019-10-21: 22 via INTRAVENOUS

## 2019-10-22 ENCOUNTER — Other Ambulatory Visit: Payer: No Typology Code available for payment source

## 2019-10-22 ENCOUNTER — Encounter (HOSPITAL_COMMUNITY): Payer: No Typology Code available for payment source

## 2019-10-24 ENCOUNTER — Other Ambulatory Visit (HOSPITAL_COMMUNITY)
Admission: RE | Admit: 2019-10-24 | Discharge: 2019-10-24 | Disposition: A | Discharge status: Home / Self Care | Payer: No Typology Code available for payment source | Source: Ambulatory Visit | Attending: General Surgery | Admitting: General Surgery

## 2019-10-24 ENCOUNTER — Inpatient Hospital Stay: Payer: No Typology Code available for payment source | Admitting: Oncology

## 2019-10-24 DIAGNOSIS — Z20822 Contact with and (suspected) exposure to covid-19: Secondary | ICD-10-CM | POA: Diagnosis not present

## 2019-10-24 DIAGNOSIS — Z01812 Encounter for preprocedural laboratory examination: Secondary | ICD-10-CM | POA: Insufficient documentation

## 2019-10-24 LAB — SARS CORONAVIRUS 2 (TAT 6-24 HRS): SARS Coronavirus 2: NEGATIVE

## 2019-10-24 NOTE — Progress Notes (Signed)

## 2019-10-28 ENCOUNTER — Ambulatory Visit (HOSPITAL_BASED_OUTPATIENT_CLINIC_OR_DEPARTMENT_OTHER): Payer: No Typology Code available for payment source | Admitting: Anesthesiology

## 2019-10-28 ENCOUNTER — Other Ambulatory Visit: Payer: Self-pay

## 2019-10-28 ENCOUNTER — Encounter (HOSPITAL_BASED_OUTPATIENT_CLINIC_OR_DEPARTMENT_OTHER)
Admission: RE | Disposition: A | Discharge status: Home / Self Care | Payer: Self-pay | Source: Home / Self Care | Attending: General Surgery

## 2019-10-28 ENCOUNTER — Ambulatory Visit (HOSPITAL_COMMUNITY): Payer: No Typology Code available for payment source

## 2019-10-28 ENCOUNTER — Encounter (HOSPITAL_BASED_OUTPATIENT_CLINIC_OR_DEPARTMENT_OTHER): Payer: Self-pay | Admitting: General Surgery

## 2019-10-28 ENCOUNTER — Ambulatory Visit (HOSPITAL_BASED_OUTPATIENT_CLINIC_OR_DEPARTMENT_OTHER)
Admission: RE | Admit: 2019-10-28 | Discharge: 2019-10-28 | Disposition: A | Discharge status: Home / Self Care | Payer: No Typology Code available for payment source | Attending: General Surgery | Admitting: General Surgery

## 2019-10-28 DIAGNOSIS — C50912 Malignant neoplasm of unspecified site of left female breast: Secondary | ICD-10-CM | POA: Diagnosis present

## 2019-10-28 DIAGNOSIS — Z95828 Presence of other vascular implants and grafts: Secondary | ICD-10-CM

## 2019-10-28 DIAGNOSIS — F329 Major depressive disorder, single episode, unspecified: Secondary | ICD-10-CM | POA: Diagnosis not present

## 2019-10-28 DIAGNOSIS — F419 Anxiety disorder, unspecified: Secondary | ICD-10-CM | POA: Diagnosis not present

## 2019-10-28 DIAGNOSIS — N6012 Diffuse cystic mastopathy of left breast: Secondary | ICD-10-CM | POA: Diagnosis not present

## 2019-10-28 DIAGNOSIS — E039 Hypothyroidism, unspecified: Secondary | ICD-10-CM | POA: Diagnosis not present

## 2019-10-28 DIAGNOSIS — J449 Chronic obstructive pulmonary disease, unspecified: Secondary | ICD-10-CM | POA: Insufficient documentation

## 2019-10-28 DIAGNOSIS — Z79890 Hormone replacement therapy (postmenopausal): Secondary | ICD-10-CM | POA: Diagnosis not present

## 2019-10-28 DIAGNOSIS — Z882 Allergy status to sulfonamides status: Secondary | ICD-10-CM | POA: Insufficient documentation

## 2019-10-28 DIAGNOSIS — Z79899 Other long term (current) drug therapy: Secondary | ICD-10-CM | POA: Diagnosis not present

## 2019-10-28 DIAGNOSIS — Z720 Tobacco use: Secondary | ICD-10-CM | POA: Insufficient documentation

## 2019-10-28 DIAGNOSIS — Z803 Family history of malignant neoplasm of breast: Secondary | ICD-10-CM | POA: Diagnosis not present

## 2019-10-28 HISTORY — PX: RE-EXCISION OF BREAST LUMPECTOMY: SHX6048

## 2019-10-28 HISTORY — PX: PORTACATH PLACEMENT: SHX2246

## 2019-10-28 SURGERY — EXCISION, LESION, BREAST
Anesthesia: General | Site: Chest | Laterality: Right

## 2019-10-28 MED ORDER — LACTATED RINGERS IV SOLN: INTRAVENOUS | Status: DC

## 2019-10-28 MED ORDER — ONDANSETRON HCL 4 MG/2ML IJ SOLN: 4.0000 mg | Freq: Once | INTRAMUSCULAR | Status: DC | PRN

## 2019-10-28 MED ORDER — FENTANYL CITRATE (PF) 100 MCG/2ML IJ SOLN
INTRAMUSCULAR | Status: AC
  Filled 2019-10-28: qty 2

## 2019-10-28 MED ORDER — ONDANSETRON HCL 4 MG/2ML IJ SOLN
INTRAMUSCULAR | Status: DC | PRN
  Administered 2019-10-28: 4 mg via INTRAVENOUS

## 2019-10-28 MED ORDER — SCOPOLAMINE 1 MG/3DAYS TD PT72
MEDICATED_PATCH | TRANSDERMAL | Status: AC
  Filled 2019-10-28: qty 1

## 2019-10-28 MED ORDER — PROPOFOL 10 MG/ML IV BOLUS
INTRAVENOUS | Status: AC
  Filled 2019-10-28: qty 20

## 2019-10-28 MED ORDER — CEFAZOLIN SODIUM-DEXTROSE 2-4 GM/100ML-% IV SOLN
INTRAVENOUS | Status: AC
  Filled 2019-10-28: qty 100

## 2019-10-28 MED ORDER — FENTANYL CITRATE (PF) 100 MCG/2ML IJ SOLN
25.0000 ug | INTRAMUSCULAR | Status: DC | PRN
  Administered 2019-10-28: 12:00:00 50 ug via INTRAVENOUS
  Administered 2019-10-28: 13:00:00 25 ug via INTRAVENOUS

## 2019-10-28 MED ORDER — OXYCODONE HCL 5 MG/5ML PO SOLN: 5.0000 mg | Freq: Once | ORAL | Status: DC | PRN

## 2019-10-28 MED ORDER — OXYCODONE HCL 5 MG PO TABS: 5.0000 mg | ORAL_TABLET | Freq: Four times a day (QID) | ORAL | 0 refills | Status: DC | PRN

## 2019-10-28 MED ORDER — LIDOCAINE 2% (20 MG/ML) 5 ML SYRINGE
INTRAMUSCULAR | Status: DC | PRN
  Administered 2019-10-28: 50 mg via INTRAVENOUS

## 2019-10-28 MED ORDER — PHENYLEPHRINE 40 MCG/ML (10ML) SYRINGE FOR IV PUSH (FOR BLOOD PRESSURE SUPPORT)
PREFILLED_SYRINGE | INTRAVENOUS | Status: AC
  Filled 2019-10-28: qty 10

## 2019-10-28 MED ORDER — BUPIVACAINE HCL (PF) 0.25 % IJ SOLN
INTRAMUSCULAR | Status: DC | PRN
  Administered 2019-10-28: 10 mL

## 2019-10-28 MED ORDER — SCOPOLAMINE 1 MG/3DAYS TD PT72
1.0000 | MEDICATED_PATCH | TRANSDERMAL | Status: DC
  Administered 2019-10-28: 1.5 mg via TRANSDERMAL

## 2019-10-28 MED ORDER — PROPOFOL 10 MG/ML IV BOLUS
INTRAVENOUS | Status: DC | PRN
  Administered 2019-10-28 (×2): 50 mg via INTRAVENOUS
  Administered 2019-10-28: 150 mg via INTRAVENOUS

## 2019-10-28 MED ORDER — PHENYLEPHRINE HCL (PRESSORS) 10 MG/ML IV SOLN
INTRAVENOUS | Status: DC | PRN
  Administered 2019-10-28: 80 ug via INTRAVENOUS

## 2019-10-28 MED ORDER — FENTANYL CITRATE (PF) 100 MCG/2ML IJ SOLN: 50.0000 ug | INTRAMUSCULAR | Status: DC | PRN

## 2019-10-28 MED ORDER — HEPARIN (PORCINE) IN NACL 2-0.9 UNITS/ML
INTRAMUSCULAR | Status: AC | PRN
  Administered 2019-10-28: 1

## 2019-10-28 MED ORDER — GABAPENTIN 100 MG PO CAPS
100.0000 mg | ORAL_CAPSULE | ORAL | Status: AC
  Administered 2019-10-28: 09:00:00 100 mg via ORAL

## 2019-10-28 MED ORDER — CEFAZOLIN SODIUM-DEXTROSE 2-4 GM/100ML-% IV SOLN
2.0000 g | INTRAVENOUS | Status: AC
  Administered 2019-10-28: 2 g via INTRAVENOUS

## 2019-10-28 MED ORDER — MIDAZOLAM HCL 2 MG/2ML IJ SOLN
INTRAMUSCULAR | Status: AC
  Filled 2019-10-28: qty 2

## 2019-10-28 MED ORDER — HEPARIN SOD (PORK) LOCK FLUSH 100 UNIT/ML IV SOLN
INTRAVENOUS | Status: DC | PRN
  Administered 2019-10-28: 500 [IU]

## 2019-10-28 MED ORDER — ACETAMINOPHEN 500 MG PO TABS
ORAL_TABLET | ORAL | Status: AC
  Filled 2019-10-28: qty 2

## 2019-10-28 MED ORDER — MIDAZOLAM HCL 2 MG/2ML IJ SOLN: 1.0000 mg | INTRAMUSCULAR | Status: DC | PRN

## 2019-10-28 MED ORDER — FENTANYL CITRATE (PF) 100 MCG/2ML IJ SOLN
INTRAMUSCULAR | Status: DC | PRN
  Administered 2019-10-28: 100 ug via INTRAVENOUS
  Administered 2019-10-28 (×4): 25 ug via INTRAVENOUS

## 2019-10-28 MED ORDER — DEXAMETHASONE SODIUM PHOSPHATE 4 MG/ML IJ SOLN
INTRAMUSCULAR | Status: DC | PRN
  Administered 2019-10-28: 10 mg via INTRAVENOUS

## 2019-10-28 MED ORDER — GABAPENTIN 100 MG PO CAPS
ORAL_CAPSULE | ORAL | Status: AC
  Filled 2019-10-28: qty 1

## 2019-10-28 MED ORDER — ACETAMINOPHEN 500 MG PO TABS
1000.0000 mg | ORAL_TABLET | ORAL | Status: AC
  Administered 2019-10-28: 1000 mg via ORAL

## 2019-10-28 MED ORDER — ENSURE PRE-SURGERY PO LIQD: 296.0000 mL | Freq: Once | ORAL | Status: DC

## 2019-10-28 MED ORDER — OXYCODONE HCL 5 MG PO TABS: 5.0000 mg | ORAL_TABLET | Freq: Once | ORAL | Status: DC | PRN

## 2019-10-28 MED ORDER — MIDAZOLAM HCL 5 MG/5ML IJ SOLN
INTRAMUSCULAR | Status: DC | PRN
  Administered 2019-10-28: 2 mg via INTRAVENOUS

## 2019-10-28 SURGICAL SUPPLY — 52 items
BAG DECANTER FOR FLEXI CONT (MISCELLANEOUS) ×3 IMPLANT
BINDER BREAST LRG (GAUZE/BANDAGES/DRESSINGS) ×3 IMPLANT
BINDER BREAST MEDIUM (GAUZE/BANDAGES/DRESSINGS) IMPLANT
BLADE SURG 11 STRL SS (BLADE) ×3 IMPLANT
BLADE SURG 15 STRL LF DISP TIS (BLADE) ×2 IMPLANT
BLADE SURG 15 STRL SS (BLADE) ×3
CANISTER SUCT 1200ML W/VALVE (MISCELLANEOUS) ×3 IMPLANT
CHLORAPREP W/TINT 26 (MISCELLANEOUS) ×3 IMPLANT
CLIP VESOCCLUDE SM WIDE 6/CT (CLIP) ×3 IMPLANT
COVER BACK TABLE 60X90IN (DRAPES) ×3 IMPLANT
COVER MAYO STAND STRL (DRAPES) ×3 IMPLANT
COVER PROBE 5X48 (MISCELLANEOUS) ×3
DECANTER SPIKE VIAL GLASS SM (MISCELLANEOUS) ×3 IMPLANT
DERMABOND ADVANCED (GAUZE/BANDAGES/DRESSINGS) ×1
DERMABOND ADVANCED .7 DNX12 (GAUZE/BANDAGES/DRESSINGS) ×2 IMPLANT
DRAPE C-ARM 42X72 X-RAY (DRAPES) ×3 IMPLANT
DRAPE LAPAROSCOPIC ABDOMINAL (DRAPES) ×3 IMPLANT
DRAPE UTILITY XL STRL (DRAPES) ×6 IMPLANT
DRSG TEGADERM 4X4.75 (GAUZE/BANDAGES/DRESSINGS) ×3 IMPLANT
ELECT COATED BLADE 2.86 ST (ELECTRODE) ×3 IMPLANT
ELECT REM PT RETURN 9FT ADLT (ELECTROSURGICAL) ×3
ELECTRODE REM PT RTRN 9FT ADLT (ELECTROSURGICAL) ×2 IMPLANT
GLOVE BIO SURGEON STRL SZ 6.5 (GLOVE) ×6 IMPLANT
GLOVE BIO SURGEON STRL SZ7 (GLOVE) ×3 IMPLANT
GLOVE BIOGEL PI IND STRL 6.5 (GLOVE) ×2 IMPLANT
GLOVE BIOGEL PI IND STRL 7.5 (GLOVE) ×2 IMPLANT
GLOVE BIOGEL PI INDICATOR 6.5 (GLOVE) ×1
GLOVE BIOGEL PI INDICATOR 7.5 (GLOVE) ×1
GOWN STRL REUS W/ TWL LRG LVL3 (GOWN DISPOSABLE) ×6 IMPLANT
GOWN STRL REUS W/TWL LRG LVL3 (GOWN DISPOSABLE) ×9
KIT CVR 48X5XPRB PLUP LF (MISCELLANEOUS) ×2 IMPLANT
KIT PORT POWER 8FR ISP CVUE (Port) ×3 IMPLANT
NEEDLE HYPO 25X1 1.5 SAFETY (NEEDLE) ×3 IMPLANT
PACK BASIN DAY SURGERY FS (CUSTOM PROCEDURE TRAY) ×3 IMPLANT
PENCIL SMOKE EVACUATOR (MISCELLANEOUS) ×3 IMPLANT
RETRACTOR ONETRAX LX 90X20 (MISCELLANEOUS) ×3 IMPLANT
SLEEVE SCD COMPRESS KNEE MED (MISCELLANEOUS) ×3 IMPLANT
SPONGE LAP 4X18 RFD (DISPOSABLE) ×6 IMPLANT
STRIP CLOSURE SKIN 1/2X4 (GAUZE/BANDAGES/DRESSINGS) ×3 IMPLANT
SUT MNCRL AB 4-0 PS2 18 (SUTURE) ×3 IMPLANT
SUT MON AB 5-0 PS2 18 (SUTURE) ×3 IMPLANT
SUT PROLENE 2 0 SH DA (SUTURE) ×3 IMPLANT
SUT SILK 2 0 SH (SUTURE) ×3 IMPLANT
SUT VIC AB 2-0 SH 27 (SUTURE) ×3
SUT VIC AB 2-0 SH 27XBRD (SUTURE) ×2 IMPLANT
SUT VIC AB 3-0 SH 27 (SUTURE) ×3
SUT VIC AB 3-0 SH 27X BRD (SUTURE) ×2 IMPLANT
SYR 5ML LUER SLIP (SYRINGE) ×3 IMPLANT
SYR CONTROL 10ML LL (SYRINGE) ×3 IMPLANT
TOWEL GREEN STERILE FF (TOWEL DISPOSABLE) ×3 IMPLANT
TUBE CONNECTING 20X1/4 (TUBING) ×3 IMPLANT
YANKAUER SUCT BULB TIP NO VENT (SUCTIONS) ×3 IMPLANT

## 2019-10-28 NOTE — Interval H&P Note (Signed)
History and Physical Interval Note:  10/28/2019 10:09 AM Proceed with margin excision and porttoday  Jessica Barnes  has presented today for surgery, with the diagnosis of LEFT BREAST CANCER.  The various methods of treatment have been discussed with the patient and family. After consideration of risks, benefits and other options for treatment, the patient has consented to  Procedure(s): LEFT RE-EXCISION OF BREAST LUMPECTOMY (Left) INSERTION PORT-A-CATH WITH ULTRASOUND GUIDANCE (N/A) as a surgical intervention.  The patient's history has been reviewed, patient examined, no change in status, stable for surgery.  I have reviewed the patient's chart and labs.  Questions were answered to the patient's satisfaction.     Rolm Bookbinder

## 2019-10-28 NOTE — Anesthesia Procedure Notes (Signed)
Procedure Name: LMA Insertion Date/Time: 10/28/2019 10:31 AM Performed by: Marrianne Mood, CRNA Pre-anesthesia Checklist: Patient identified, Emergency Drugs available, Suction available, Patient being monitored and Timeout performed Patient Re-evaluated:Patient Re-evaluated prior to induction Oxygen Delivery Method: Circle system utilized Preoxygenation: Pre-oxygenation with 100% oxygen Induction Type: IV induction Ventilation: Mask ventilation without difficulty LMA: LMA inserted LMA Size: 4.0 Number of attempts: 1 Airway Equipment and Method: Bite block Placement Confirmation: positive ETCO2 Tube secured with: Tape Dental Injury: Teeth and Oropharynx as per pre-operative assessment

## 2019-10-28 NOTE — Anesthesia Preprocedure Evaluation (Addendum)
Anesthesia Evaluation  Patient identified by MRN, date of birth, ID band Patient awake    Reviewed: Allergy & Precautions, NPO status , Patient's Chart, lab work & pertinent test results  History of Anesthesia Complications (+) PONV and history of anesthetic complications  Airway Mallampati: II  TM Distance: >3 FB Neck ROM: Full    Dental  (+) Chipped, Poor Dentition,    Pulmonary COPD, Patient abstained from smoking., former smoker,    Pulmonary exam normal breath sounds clear to auscultation       Cardiovascular negative cardio ROS Normal cardiovascular exam Rhythm:Regular Rate:Normal     Neuro/Psych  Headaches, PSYCHIATRIC DISORDERS Anxiety Depression    GI/Hepatic negative GI ROS, Neg liver ROS,   Endo/Other  Hypothyroidism Left Breast Ca  Renal/GU negative Renal ROS  negative genitourinary   Musculoskeletal negative musculoskeletal ROS (+)   Abdominal   Peds  Hematology negative hematology ROS (+)   Anesthesia Other Findings   Reproductive/Obstetrics                            Anesthesia Physical Anesthesia Plan  ASA: II  Anesthesia Plan: General   Post-op Pain Management:    Induction: Intravenous  PONV Risk Score and Plan: 4 or greater and Midazolam, Ondansetron, Treatment may vary due to age or medical condition, Dexamethasone and Scopolamine patch - Pre-op  Airway Management Planned: LMA  Additional Equipment:   Intra-op Plan:   Post-operative Plan: Extubation in OR  Informed Consent: I have reviewed the patients History and Physical, chart, labs and discussed the procedure including the risks, benefits and alternatives for the proposed anesthesia with the patient or authorized representative who has indicated his/her understanding and acceptance.     Dental advisory given  Plan Discussed with: CRNA and Surgeon  Anesthesia Plan Comments:          Anesthesia Quick Evaluation

## 2019-10-28 NOTE — Transfer of Care (Signed)
Immediate Anesthesia Transfer of Care Note  Patient: Jessica Barnes  Procedure(s) Performed: RE-EXCISION OF LEFT BREAST LUMPECTOMY (Left Breast) INSERTION PORT-A-CATH WITH ULTRASOUND GUIDANCE (Right Chest)  Patient Location: PACU  Anesthesia Type:General  Level of Consciousness: awake and pateint uncooperative  Airway & Oxygen Therapy: Patient Spontanous Breathing and Patient connected to face mask oxygen  Post-op Assessment: Report given to RN and Post -op Vital signs reviewed and stable  Post vital signs: Reviewed and stable  Last Vitals:  Vitals Value Taken Time  BP    Temp    Pulse 103 10/28/19 1147  Resp 17 10/28/19 1148  SpO2 100 % 10/28/19 1147  Vitals shown include unvalidated device data.  Last Pain:  Vitals:   10/28/19 0902  TempSrc: Oral  PainSc: 0-No pain      Patients Stated Pain Goal: 3 ([id number])  Complications: No apparent anesthesia complications

## 2019-10-28 NOTE — Anesthesia Postprocedure Evaluation (Signed)
Anesthesia Post Note  Patient: Jessica Barnes  Procedure(s) Performed: RE-EXCISION OF LEFT BREAST LUMPECTOMY (Left Breast) INSERTION PORT-A-CATH WITH ULTRASOUND GUIDANCE (Right Chest)     Patient location during evaluation: PACU Anesthesia Type: General Level of consciousness: awake and alert and oriented Pain management: pain level controlled Vital Signs Assessment: post-procedure vital signs reviewed and stable Respiratory status: spontaneous breathing, nonlabored ventilation and respiratory function stable Cardiovascular status: blood pressure returned to baseline and stable Postop Assessment: no apparent nausea or vomiting Anesthetic complications: no    Last Vitals:  Vitals:   10/28/19 1239 10/28/19 1245  BP:  117/66  Pulse: 89 89  Resp: 13 14  Temp:    SpO2: 99% 98%    Last Pain:  Vitals:   10/28/19 1245  TempSrc:   PainSc: 4                  Nivan Melendrez A.

## 2019-10-28 NOTE — Discharge Instructions (Signed)
[facility] Office Phone Number [phone]  BREAST BIOPSY/ PARTIAL MASTECTOMY: POST OP INSTRUCTIONS Take 400 mg of ibuprofen every 8 hours or 650 mg tylenol every 6 hours for next 72 hours then as needed. Use ice several times daily also. Always review your discharge instruction sheet given to you by the facility where your surgery was performed.  IF YOU HAVE DISABILITY OR FAMILY LEAVE FORMS, YOU MUST BRING THEM TO THE OFFICE FOR PROCESSING.  DO NOT GIVE THEM TO YOUR DOCTOR.  1. A prescription for pain medication may be given to you upon discharge.  Take your pain medication as prescribed, if needed.  If narcotic pain medicine is not needed, then you may take acetaminophen (Tylenol), naprosyn (Alleve) or ibuprofen (Advil) as needed. 2. Take your usually prescribed medications unless otherwise directed 3. If you need a refill on your pain medication, please contact your pharmacy.  They will contact our office to request authorization.  Prescriptions will not be filled after 5pm or on week-ends. 4. You should eat very light the first 24 hours after surgery, such as soup, crackers, pudding, etc.  Resume your normal diet the day after surgery. 5. Most patients will experience some swelling and bruising in the breast.  Ice packs and a good support bra will help.  Wear the breast binder provided or a sports bra for 72 hours day and night.  After that wear a sports bra during the day until you return to the office. Swelling and bruising can take several days to resolve.  6. It is common to experience some constipation if taking pain medication after surgery.  Increasing fluid intake and taking a stool softener will usually help or prevent this problem from occurring.  A mild laxative (Milk of Magnesia or Miralax) should be taken according to package directions if there are no bowel movements after 48 hours. 7. Unless discharge instructions indicate otherwise, you may remove your bandages 48  hours after surgery and you may shower at that time.  You may have steri-strips (small skin tapes) in place directly over the incision.  These strips should be left on the skin for 7-10 days and will come off on their own.  If your surgeon used skin glue on the incision, you may shower in 24 hours.  The glue will flake off over the next 2-3 weeks.  Any sutures or staples will be removed at the office during your follow-up visit. 8. ACTIVITIES:  You may resume regular daily activities (gradually increasing) beginning the next day.  Wearing a good support bra or sports bra minimizes pain and swelling.  You may have sexual intercourse when it is comfortable. a. You may drive when you no longer are taking prescription pain medication, you can comfortably wear a seatbelt, and you can safely maneuver your car and apply brakes. b. RETURN TO WORK:  ______________________________________________________________________________________ 9. You should see your doctor in the office for a follow-up appointment approximately two weeks after your surgery.  Your doctor's nurse will typically make your follow-up appointment when she calls you with your pathology report.  Expect your pathology report 3-4 business days after your surgery.  You may call to check if you do not hear from us after three days. 10. OTHER INSTRUCTIONS: _______________________________________________________________________________________________ _____________________________________________________________________________________________________________________________________ _____________________________________________________________________________________________________________________________________ _____________________________________________________________________________________________________________________________________  WHEN TO CALL DR WAKEFIELD: 1. Fever over 101.0 2. Nausea and/or vomiting. 3. Extreme swelling or  bruising. 4. Continued bleeding from incision. 5. Increased pain, redness, or drainage from the incision.  The clinic   staff is available to answer your questions during regular business hours.  Please don't hesitate to call and ask to speak to one of the nurses for clinical concerns.  If you have a medical emergency, go to the nearest emergency room or call 911.  A surgeon from [facility] is always on call at the hospital.  For further questions, please visit [email or web address]     PORT-A-CATH: POST OP INSTRUCTIONS  Always review your discharge instruction sheet given to you by the facility where your surgery was performed.   1. A prescription for pain medication may be given to you upon discharge. Take your pain medication as prescribed, if needed. If narcotic pain medicine is not needed, then you make take acetaminophen (Tylenol) or ibuprofen (Advil) as needed.  2. Take your usually prescribed medications unless otherwise directed. 3. If you need a refill on your pain medication, please contact our office. All narcotic pain medicine now requires a paper prescription.  Phoned in and fax refills are no longer allowed by law.  Prescriptions will not be filled after 5 pm or on weekends.  4. You should follow a light diet for the remainder of the day after your procedure. 5. Most patients will experience some mild swelling and/or bruising in the area of the incision. It may take several days to resolve. 6. It is common to experience some constipation if taking pain medication after surgery. Increasing fluid intake and taking a stool softener (such as Colace) will usually help or prevent this problem from occurring. A mild laxative (Milk of Magnesia or Miralax) should be taken according to package directions if there are no bowel movements after 48 hours.  7. Unless discharge instructions indicate otherwise, you may remove your bandages 48 hours after surgery, and you may  shower at that time. You may have steri-strips (small white skin tapes) in place directly over the incision.  These strips should be left on the skin for 7-10 days.  If your surgeon used Dermabond (skin glue) on the incision, you may shower in 24 hours.  The glue will flake off over the next 2-3 weeks.  8. If your port is left accessed at the end of surgery (needle left in port), the dressing cannot get wet and should only by changed by a healthcare professional. When the port is no longer accessed (when the needle has been removed), follow step 7.   9. ACTIVITIES:  Limit activity involving your arms for the next 72 hours. Do no strenuous exercise or activity for 1 week. You may drive when you are no longer taking prescription pain medication, you can comfortably wear a seatbelt, and you can maneuver your car. 10.You may need to see your doctor in the office for a follow-up appointment.  Please       check with your doctor.  11.When you receive a new Port-a-Cath, you will get a product guide and        ID card.  Please keep them in case you need them.  WHEN TO CALL YOUR DOCTOR [phone]): 1. Fever over 101.0 2. Chills 3. Continued bleeding from incision 4. Increased redness and tenderness at the site 5. Shortness of breath, difficulty breathing   The clinic staff is available to answer your questions during regular business hours. Please don't hesitate to call and ask to speak to one of the nurses or medical assistants for clinical concerns. If you have a medical emergency, go to the nearest emergency room or  call 911.  A surgeon from [facility] is always on call at the hospital.     For further information, please visit [email or web address]    NO TYLENOL PRODUCTS UNTIL 3:30 PM    Post Anesthesia Home Care Instructions  Activity: Get plenty of rest for the remainder of the day. A responsible individual must stay with you for 24 hours following the procedure.   For the next 24 hours, DO NOT: -Drive a car -Paediatric nurse -Drink alcoholic beverages -Take any medication unless instructed by your physician -Make any legal decisions or sign important papers.  Meals: Start with liquid foods such as gelatin or soup. Progress to regular foods as tolerated. Avoid greasy, spicy, heavy foods. If nausea and/or vomiting occur, drink only clear liquids until the nausea and/or vomiting subsides. Call your physician if vomiting continues.  Special Instructions/Symptoms: Your throat may feel dry or sore from the anesthesia or the breathing tube placed in your throat during surgery. If this causes discomfort, gargle with warm salt water. The discomfort should disappear within 24 hours.  If you had a scopolamine patch placed behind your ear for the management of post- operative nausea and/or vomiting:  1. The medication in the patch is effective for 72 hours, after which it should be removed.  Wrap patch in a tissue and discard in the trash. Wash hands thoroughly with soap and water. 2. You may remove the patch earlier than 72 hours if you experience unpleasant side effects which may include dry mouth, dizziness or visual disturbances. 3. Avoid touching the patch. Wash your hands with soap and water after contact with the patch.

## 2019-10-28 NOTE — Op Note (Signed)
Preoperative diagnosis:left breast cancer, possible positive margin, need for venous access for chemotherpy Postoperative diagnosis: saa Procedure: Right ij port placement with US guidance Re-excision left breast superior subareola and lateral margins Surgeon: Dr Serita Grammes Asst: Carlena Hurl, PA-C EBL: minimal Anesthesia: general  Complications none Drains none Specimens:left breast superior and lateral margins marked short superior, long lateral, double deep Sponge and needle count correct times two dispo to recovery stable  Indications:58 yofwho had mm/us. she has c density breasts, in luoq there is a 2.2 cm mass on mm and on [place] is 2.3x1.6x1.9 cm in size. ax nodes are negative on [place]. 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 there is no lvi. She had mrii with additional lesion about 1.5 cm away . 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 She underwent lumpectomy and sn biopsy.  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  Another path review states that margin is positive. I have discussed with our pathologist and will re-excise just to make sure.  Nodes are positive and she will get chemotherapy.   Procedure:After informed consent was obtained the patient was taken to the operating room. She was given antibiotics. SCDs were placed. She was placed under general anesthesia without complication. She was prepped and draped in the standard sterile surgical fashion. A surgical timeout was then performed.  I identified the internal jugular vein on the right side with the ultrasound. I made a small nick in the skin. I accessed the internal jugular vein with the needle under ultrasound guidance. I passed the wire. The wire was confirmed to be in position with fluoroscopy.The wire was in the vein by ultrasound as well.I then  infiltrated Marcaine below the clavicle on the right side. Imade anincision and developed a subcutaneous pocket for the port. I then tunneled between the port site as well as the insertion site. I brought the line through this. I then placed the dilator under fluoroscopic guidance over the wire. I removedthe wire andthe dilator. I then placed the line into the sheath. The sheath was then removed. I pulled the line back to be in the distal vena cava. I then hooked this up to the port. This was placed in the pocket and sutured in place with 2-0 Prolene suture. I then closed this with 3-0 Vicryl and 4-0 Monocryl. Glue was placed. I accessed this. It withdrew blood and flushed easily. I packed it with heparin.   I then infiltrated marcaine in the left breast. I reopened old incision and released all my sutures. I then excised the lateral margin of larger lumpectomy and some of superior margin in subareolar region just to ensure clearance.  These were marked as above. Hemostasis obtained. I closed tissue with 2-0 vicryl. The skin was closed with 3-0 vicryl and 5-0 monocryl. Glue and steristrips applied. Binder placed.  She was extubated and transferred to recovery stable.

## 2019-10-29 ENCOUNTER — Encounter: Payer: Self-pay | Admitting: *Deleted

## 2019-10-30 LAB — SURGICAL PATHOLOGY

## 2019-10-31 ENCOUNTER — Telehealth: Payer: Self-pay | Admitting: Internal Medicine

## 2019-10-31 DIAGNOSIS — Z170 Estrogen receptor positive status [ER+]: Secondary | ICD-10-CM

## 2019-10-31 DIAGNOSIS — C50412 Malignant neoplasm of upper-outer quadrant of left female breast: Secondary | ICD-10-CM

## 2019-10-31 NOTE — Addendum Note (Signed)
Addended by: Gloris Ham on: 10/31/2019 10:53 AM   Modules accepted: Orders

## 2019-10-31 NOTE — Telephone Encounter (Signed)
Left a message for patient's husband to discuss follow-up.  C-schedule follow-up MD- labs-CBC CMP-next week.

## 2019-11-04 ENCOUNTER — Encounter: Payer: Self-pay | Admitting: Gastroenterology

## 2019-11-06 ENCOUNTER — Inpatient Hospital Stay: Payer: No Typology Code available for payment source

## 2019-11-06 ENCOUNTER — Encounter (INDEPENDENT_AMBULATORY_CARE_PROVIDER_SITE_OTHER): Payer: No Typology Code available for payment source | Admitting: Gastroenterology

## 2019-11-06 ENCOUNTER — Inpatient Hospital Stay (HOSPITAL_BASED_OUTPATIENT_CLINIC_OR_DEPARTMENT_OTHER): Payer: No Typology Code available for payment source | Admitting: Internal Medicine

## 2019-11-06 ENCOUNTER — Other Ambulatory Visit: Payer: Self-pay

## 2019-11-06 DIAGNOSIS — Z79899 Other long term (current) drug therapy: Secondary | ICD-10-CM | POA: Diagnosis not present

## 2019-11-06 DIAGNOSIS — E039 Hypothyroidism, unspecified: Secondary | ICD-10-CM | POA: Diagnosis not present

## 2019-11-06 DIAGNOSIS — C50412 Malignant neoplasm of upper-outer quadrant of left female breast: Secondary | ICD-10-CM | POA: Diagnosis present

## 2019-11-06 DIAGNOSIS — Z800 Family history of malignant neoplasm of digestive organs: Secondary | ICD-10-CM | POA: Diagnosis not present

## 2019-11-06 DIAGNOSIS — I700 Atherosclerosis of aorta: Secondary | ICD-10-CM | POA: Diagnosis not present

## 2019-11-06 DIAGNOSIS — Z170 Estrogen receptor positive status [ER+]: Secondary | ICD-10-CM

## 2019-11-06 DIAGNOSIS — Z5111 Encounter for antineoplastic chemotherapy: Secondary | ICD-10-CM | POA: Diagnosis not present

## 2019-11-06 DIAGNOSIS — F4310 Post-traumatic stress disorder, unspecified: Secondary | ICD-10-CM | POA: Diagnosis not present

## 2019-11-06 DIAGNOSIS — Z803 Family history of malignant neoplasm of breast: Secondary | ICD-10-CM | POA: Diagnosis not present

## 2019-11-06 DIAGNOSIS — E785 Hyperlipidemia, unspecified: Secondary | ICD-10-CM | POA: Diagnosis not present

## 2019-11-06 DIAGNOSIS — J449 Chronic obstructive pulmonary disease, unspecified: Secondary | ICD-10-CM | POA: Diagnosis not present

## 2019-11-06 DIAGNOSIS — Z87891 Personal history of nicotine dependence: Secondary | ICD-10-CM | POA: Diagnosis not present

## 2019-11-06 DIAGNOSIS — F418 Other specified anxiety disorders: Secondary | ICD-10-CM | POA: Diagnosis not present

## 2019-11-06 LAB — CBC WITH DIFFERENTIAL/PLATELET
Abs Immature Granulocytes: 0.02 10*3/uL (ref 0.00–0.07)
Basophils Absolute: 0.1 10*3/uL (ref 0.0–0.1)
Basophils Relative: 1 %
Eosinophils Absolute: 0.3 10*3/uL (ref 0.0–0.5)
Eosinophils Relative: 3 %
HCT: 36.3 % (ref 36.0–46.0)
Hemoglobin: 11.8 g/dL — ABNORMAL LOW (ref 12.0–15.0)
Immature Granulocytes: 0 %
Lymphocytes Relative: 30 %
Lymphs Abs: 2.3 10*3/uL (ref 0.7–4.0)
MCH: 29.9 pg (ref 26.0–34.0)
MCHC: 32.5 g/dL (ref 30.0–36.0)
MCV: 92.1 fL (ref 80.0–100.0)
Monocytes Absolute: 0.5 10*3/uL (ref 0.1–1.0)
Monocytes Relative: 7 %
Neutro Abs: 4.5 10*3/uL (ref 1.7–7.7)
Neutrophils Relative %: 59 %
Platelets: 343 10*3/uL (ref 150–400)
RBC: 3.94 MIL/uL (ref 3.87–5.11)
RDW: 12.8 % (ref 11.5–15.5)
WBC: 7.6 10*3/uL (ref 4.0–10.5)
nRBC: 0 % (ref 0.0–0.2)

## 2019-11-06 LAB — COMPREHENSIVE METABOLIC PANEL
ALT: 20 U/L (ref 0–44)
AST: 21 U/L (ref 15–41)
Albumin: 3.7 g/dL (ref 3.5–5.0)
Alkaline Phosphatase: 194 U/L — ABNORMAL HIGH (ref 38–126)
Anion gap: 9 (ref 5–15)
BUN: 11 mg/dL (ref 6–20)
CO2: 24 mmol/L (ref 22–32)
Calcium: 8.5 mg/dL — ABNORMAL LOW (ref 8.9–10.3)
Chloride: 105 mmol/L (ref 98–111)
Creatinine, Ser: 0.8 mg/dL (ref 0.44–1.00)
GFR calc Af Amer: >60 mL/min (ref >60)
GFR calc non Af Amer: >60 mL/min (ref >60)
Glucose, Bld: 115 mg/dL — ABNORMAL HIGH (ref 70–99)
Potassium: 2.8 mmol/L — ABNORMAL LOW (ref 3.5–5.1)
Sodium: 138 mmol/L (ref 135–145)
Total Bilirubin: 0.3 mg/dL (ref 0.3–1.2)
Total Protein: 6.9 g/dL (ref 6.5–8.1)

## 2019-11-06 MED ORDER — POTASSIUM CHLORIDE CRYS ER 20 MEQ PO TBCR: EXTENDED_RELEASE_TABLET | ORAL | 3 refills | Status: DC

## 2019-11-06 NOTE — Progress Notes (Signed)
[facility] NOTE  Patient Care Team: Tonia Ghent, [name], [name]  #  Oncology History Overview Note  # DEC 2020- LEFT BREAST-TWO PRIM ARIES:   #A]-IMC; ER/PR >90%; Her 2-IHC-2+; FISH-NEG-2 ; MRI- 3.7 CM; s/p s/p lumpectomy; s/p reexcision-final margins negative.  Stage Ib.    #B] Bx- IMC; G-1; ER/PR-pos; her2 NEG. s/p lumpectomy; margins negative [as per Dr.Wakefield; discussed at [facility] tumor conference]  Used estrogen and progesterone therapy: on premarin for post- menopausal x 2-3 years- discontinued in JAN 2021.   # SURVIVORSHIP:   DIAGNOSIS: Breast cancer  STAGE: IB & I     ;  GOALS: Cure  CURRENT/MOST RECENT THERAPY : TC    Carcinoma of upper-outer quadrant of left breast in female, estrogen receptor positive (Jessica Barnes)  08/14/2019 Initial Diagnosis   Carcinoma of upper-outer quadrant of left breast in female, estrogen receptor positive (Jessica Barnes)   09/10/2019 Genetic Testing   Negative genetic testing on the STAT and common hereditary cancer panel.  The Common Hereditary Gene Panel offered by [facility] includes sequencing and/or deletion duplication testing of the following 48 genes: APC, ATM, AXIN2, BARD1, BMPR1A, BRCA1, BRCA2, BRIP1, CDH1, CDK4, CDKN2A (p14ARF), CDKN2A (p16INK4a), CHEK2, CTNNA1, DICER1, EPCAM (Deletion/duplication testing only), GREM1 (promoter region deletion/duplication testing only), KIT, MEN1, MLH1, MSH2, MSH3, MSH6, MUTYH, NBN, NF1, NHTL1, PALB2, PDGFRA, PMS2, POLD1, POLE, PTEN, RAD50, RAD51C, RAD51D, RNF43, SDHB, SDHC, SDHD, SMAD4, SMARCA4. STK11, TP53, TSC1, TSC2, and VHL.  The following genes were evaluated for sequence changes only: SDHA and HOXB13 c.251G>A variant only. The report date is September 10, 2019.   10/03/2019 Cancer Staging   Staging form: Breast, AJCC 8th Edition - Pathologic stage from 10/03/2019:  Stage IB (pT2, pN1a, cM0, G2, ER+, PR+, HER2-) - Signed by Gardenia Phlegm, NP on 10/16/2019   10/09/2019 - 10/09/2019 Chemotherapy   The patient had DOXOrubicin (ADRIAMYCIN) chemo injection 98 mg, 60 mg/m2, Intravenous,  Once, 0 of 4 cycles PALONOSETRON HCL INJECTION 0.25 MG/5ML, 0.25 mg, Intravenous,  Once, 0 of 4 cycles pegfilgrastim-jmdb (FULPHILA) injection 6 mg, 6 mg, Subcutaneous,  Once, 0 of 4 cycles cyclophosphamide (CYTOXAN) 980 mg in sodium chloride 0.9 % 250 mL chemo infusion, 600 mg/m2, Intravenous,  Once, 0 of 4 cycles PACLitaxel (TAXOL) 132 mg in sodium chloride 0.9 % 250 mL chemo infusion (</= 41m/m2), 80 mg/m2, Intravenous,  Once, 0 of 12 cycles FOSAPREPITANT IV INFUSION 150 MG, 150 mg, Intravenous,  Once, 0 of 4 cycles  for chemotherapy treatment.    11/07/2019 -  Chemotherapy   The patient had palonosetron (ALOXI) injection 0.25 mg, 0.25 mg, Intravenous,  Once, 0 of 4 cycles pegfilgrastim-jmdb (FULPHILA) injection 6 mg, 6 mg, Subcutaneous,  Once, 0 of 4 cycles cyclophosphamide (CYTOXAN) 980 mg in sodium chloride 0.9 % 250 mL chemo infusion, 600 mg/m2, Intravenous,  Once, 0 of 4 cycles DOCEtaxel (TAXOTERE) 120 mg in sodium chloride 0.9 % 250 mL chemo infusion, 75 mg/m2, Intravenous,  Once, 0 of 4 cycles  for chemotherapy treatment.       HISTORY OF PRESENTING ILLNESS:  Jessica Barnes 59y.o.  female with recent diagnosis of breast cancer  [T2N1-ER/PR positive HER-2 negative; T1N1-ER/PR positive HER-2 negative 2 separate primaries] is here for follow-up.  Patient underwent reexcision given the concerns of a positive margin; final margins negative.  Patient is here to discuss adjuvant chemotherapy.  Patient  continues to be a lot of anxiety.  Otherwise denies any bone pain.  Denies any nausea vomiting.  Review of Systems  Constitutional: Negative for chills, diaphoresis, fever, malaise/fatigue and weight loss.  HENT: Negative for nosebleeds and sore  throat.   Eyes: Negative for double vision.  Respiratory: Negative for cough, hemoptysis, sputum production, shortness of breath and wheezing.   Cardiovascular: Negative for chest pain, palpitations, orthopnea and leg swelling.  Gastrointestinal: Negative for abdominal pain, blood in stool, constipation, diarrhea, heartburn, melena, nausea and vomiting.  Genitourinary: Negative for dysuria, frequency and urgency.  Musculoskeletal: Negative for back pain and joint pain.  Skin: Negative.  Negative for itching and rash.  Neurological: Positive for headaches. Negative for dizziness, tingling, focal weakness and weakness.  Endo/Heme/Allergies: Does not bruise/bleed easily.  Psychiatric/Behavioral: Positive for depression. The patient is nervous/anxious. The patient does not have insomnia.      MEDICAL HISTORY:  Past Medical History:  Diagnosis Date  . Anxiety 10/13/96  . Chest pain    atypical, hosp for that and depression 8/10-8/11/08  . COPD (chronic obstructive pulmonary disease) ([place]) 09/15/05   by x-ray  . Depression 10/13/96   hospital 8/10-8/11/08.  notable history: son with TBI after MVA  . Family history of breast cancer   . Family history of colon cancer   . Family history of pancreatic cancer   . Hyperlipidemia 09/15/98  . Hypothyroidism 01/14/96  . Migraine    27 yoa  . NSVD (normal spontaneous vaginal delivery)    x 1  . PONV (postoperative nausea and vomiting)   . PTSD (post-traumatic stress disorder)    likely PTSD after son's MVA/TBI in 2007, s/p counseling    SURGICAL HISTORY: Past Surgical History:  Procedure Laterality Date  . APPENDECTOMY     59 years of age  . BREAST BIOPSY Left 08/06/2019   [place] bx/ ribbon clip/ path pending  . BREAST BIOPSY Left 09/06/2019   [place] path pending heart clip  . BREAST LUMPECTOMY WITH RADIOACTIVE SEED AND SENTINEL LYMPH NODE BIOPSY Left 10/01/2019   Procedure: LEFT BREAST LUMPECTOMY WITH BRACKETED RADIOACTIVE SEEDS AND LEFT AXILLARY  SENTINEL LYMPH NODE BIOPSY;  Surgeon: Rolm Bookbinder, MD;  Location: [facility];  Service: General;  Laterality: Left;  . PORTACATH PLACEMENT Right 10/28/2019   Procedure: INSERTION PORT-A-CATH WITH ULTRASOUND GUIDANCE;  Surgeon: Rolm Bookbinder, MD;  Location: [facility];  Service: General;  Laterality: Right;  . RE-EXCISION OF BREAST LUMPECTOMY Left 10/28/2019   Procedure: RE-EXCISION OF LEFT BREAST LUMPECTOMY;  Surgeon: Rolm Bookbinder, MD;  Location: [facility];  Service: General;  Laterality: Left;    SOCIAL HISTORY: Social History   Socioeconomic History  . Marital status: Married    Spouse name: Not on file  . Number of children: Not on file  . Years of education: Not on file  . Highest education level: Not on file  Occupational History  . Not on file  Tobacco Use  . Smoking status: Former Smoker    Packs/day: 0.50    Years: 8.00    Pack years: 4.00    Types: Cigarettes  . Smokeless tobacco: Never Used  . Tobacco comment: quit 04/15/2014  Substance and Sexual Activity  . Alcohol use: No    Alcohol/week: 0.0 standard drinks  . Drug use: No  . Sexual activity: Not on file  Other Topics Concern  . Not on file  Social History Narrative   Remarried, lives with husband 1 son,  he had TBI and short term memory loss after prolonged illness      #Patient lives in [place] with her husband; prior history of smoking/currently vapes.  No significant alcohol use.  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    Social Determinants of Health   Financial Resource Strain:   . Difficulty of Paying Living Expenses:   Food Insecurity:   . Worried About Charity fundraiser in the Last Year:   . Arboriculturist in the Last Year:   Transportation Needs:   . Film/video editor (Medical):   Marland Kitchen Lack of Transportation (Non-Medical):   Physical Activity:   . Days of Exercise per Week:   . Minutes of Exercise per Session:   Stress:   .  Feeling of Stress :   Social Connections:   . Frequency of Communication with Friends and Family:   . Frequency of Social Gatherings with Friends and Family:   . Attends Religious Services:   . Active Member of Clubs or Organizations:   . Attends Archivist Meetings:   Marland Kitchen Marital Status:   Intimate Partner Violence:   . Fear of Current or Ex-Partner:   . Emotionally Abused:   Marland Kitchen Physically Abused:   . Sexually Abused:     FAMILY HISTORY: Family History  Problem Relation Age of Onset  . Hypertension Mother   . Colon cancer Father        d. 74  . Thyroid disease Sister   . Breast cancer Paternal Aunt   . Breast cancer Paternal Grandmother   . Colon cancer Paternal Grandmother   . Breast cancer Cousin        paternal aunt's daughter  . Colon cancer Paternal Uncle        3 pat uncles with colon cancer at unknown ages  . Pancreatic cancer Maternal Uncle        d. 79  . Breast cancer Paternal Aunt     ALLERGIES:  is allergic to sulfamethoxazole-trimethoprim and codeine.  MEDICATIONS:  Current Outpatient Medications  Medication Sig Dispense Refill  . acetaminophen (TYLENOL) 500 MG tablet Take 1,000 mg by mouth every 6 (six) hours as needed for mild pain or headache.    . butalbital-acetaminophen-caffeine (FIORICET) 50-325-40 MG tablet TAKE 1 TABLET BY MOUTH EVERY 4 TO 6 HOURS AS NEEDED FOR HEADACHE 90 tablet 3  . diazepam (VALIUM) 10 MG tablet TAKE 1 TABLET BY MOUTH AT BEDTIME AS NEEDED FOR SLEEP 30 tablet 1  . dimenhyDRINATE (DRAMAMINE) 50 MG tablet Take 50 mg by mouth every 8 (eight) hours as needed.    Marland Kitchen FLUoxetine (PROZAC) 20 MG tablet Take 60 mg by mouth daily.    Marland Kitchen ibuprofen (ADVIL) 100 MG tablet Take 100 mg by mouth every 6 (six) hours as needed for fever.    . levothyroxine (SYNTHROID) 88 MCG tablet TAKE 1 TABLET BY MOUTH DAILY EXCEPT ON SUNDAYS TAKE 1/2 TABLET 90 tablet 1  . lidocaine-prilocaine (EMLA) cream Apply 1 application topically as needed. 30 g 0  .  loperamide (IMODIUM A-D) 2 MG tablet Take 4 mg by mouth 4 (four) times daily as needed for diarrhea or loose stools.    . ondansetron (ZOFRAN) 8 MG tablet One pill every 8 hours as needed for nausea/vomitting. 40 tablet 1  . oxyCODONE (OXY IR/ROXICODONE) 5 MG immediate release tablet Take 1 tablet (5 mg total) by mouth every 6 (six) hours as needed for moderate pain, severe pain or breakthrough pain.  10 tablet 0  . prochlorperazine (COMPAZINE) 10 MG tablet Take 1 tablet (10 mg total) by mouth every 6 (six) hours as needed for nausea or vomiting. 40 tablet 1  . dexamethasone (DECADRON) 4 MG tablet 1 tablet AM & PM; Take Day prior and day after chemo. 30 tablet 0  . FLUoxetine (PROZAC) 20 MG capsule TAKE 3 CAPSULES BY MOUTH DAILY 270 capsule 1  . potassium chloride SA (KLOR-CON) 20 MEQ tablet 1 pill twice a day 60 tablet 3   No current facility-administered medications for this visit.      Marland Kitchen  PHYSICAL EXAMINATION: ECOG PERFORMANCE STATUS: 0 - Asymptomatic  Vitals:   11/06/19 1018  BP: 111/76  Pulse: 91  Temp: (!) 95.9 F (35.5 C)   Filed Weights   11/06/19 1018  Weight: 130 lb (59 kg)    Physical Exam  Constitutional: She is oriented to person, place, and time and well-developed, well-nourished, and in no distress.  HENT:  Head: Normocephalic and atraumatic.  Mouth/Throat: Oropharynx is clear and moist. No oropharyngeal exudate.  Eyes: Pupils are equal, round, and reactive to light.  Cardiovascular: Normal rate and regular rhythm.  Pulmonary/Chest: Effort normal and breath sounds normal. No respiratory distress. She has no wheezes.  Abdominal: Soft. Bowel sounds are normal. She exhibits no distension and no mass. There is no abdominal tenderness. There is no rebound and no guarding.  Musculoskeletal:        General: No tenderness or edema. Normal range of motion.     Cervical back: Normal range of motion and neck supple.  Neurological: She is alert and oriented to person,  place, and time.  Skin: Skin is warm.  Psychiatric: Affect normal.  Anxious   LABORATORY DATA:  I have reviewed the data as listed Lab Results  Component Value Date   WBC 7.6 11/06/2019   HGB 11.8 (L) 11/06/2019   HCT 36.3 11/06/2019   MCV 92.1 11/06/2019   PLT 343 11/06/2019   Recent Labs    08/14/19 1544 08/14/19 1544 09/06/19 1054 10/10/19 1325 11/06/19 0950  NA 138  --   --   --  138  K 3.4*  --   --   --  2.8*  CL 103  --   --   --  105  CO2 25  --   --   --  24  GLUCOSE 101*  --   --   --  115*  BUN 11  --   --   --  11  CREATININE 0.86  --   --   --  0.80  CALCIUM 8.8*  --   --  8.6* 8.5*  GFRNONAA >60  --   --   --  >60  GFRAA >60  --   --   --  >60  PROT 7.6   < > 7.6 6.5 6.9  ALBUMIN 3.7   < > 3.8 4.0 3.7  AST 21   < > 27 23 21   ALT 17   < > 18 21 20   ALKPHOS 171*   < > 174* 200* 194*  BILITOT 0.4   < > 0.4 <0.2 0.3  BILIDIR  --   --  <0.1 0.07  --   IBILI  --   --  NOT CALCULATED  --   --    < > = values in this interval not displayed.    RADIOGRAPHIC STUDIES: I have personally reviewed the radiological images as listed and agreed  with the findings in the report. NM Cardiac Muga Rest  Result Date: 10/22/2019 CLINICAL DATA:  Breast cancer. Evaluate cardiac function in relation to chemotherapy. EXAM: NUCLEAR MEDICINE CARDIAC BLOOD POOL IMAGING (MUGA) TECHNIQUE: Cardiac multi-gated acquisition was performed at rest following intravenous injection of Tc-65mlabeled red blood cells. RADIOPHARMACEUTICALS:  20.0 mCi Tc-931mertechnetate in-vitro labeled red blood cells IV COMPARISON:  None FINDINGS: No  focal wall motion abnormality of the left ventricle. Calculated left ventricular ejection fraction equals 54.7% IMPRESSION: Left ventricular ejection fraction equals54.7 %. Electronically Signed   By: StSuzy Bouchard.D.   On: 10/22/2019 16:43   DG CHEST PORT 1 VIEW  Result Date: 10/28/2019 CLINICAL DATA:  Port-A-Cath placement EXAM: PORTABLE CHEST 1 VIEW  COMPARISON:  March 31, 2010. FINDINGS: Note that the lung bases are not completely visualized on this study. Port-A-Cath tip is in the superior vena cava. No pneumothorax. There is apparent soft tissue opacity overlying the left lower lung region. Visualized lungs elsewhere clear. Heart size and pulmonary vascularity normal. No adenopathy. No bone lesions. Postoperative change noted left hemithorax. IMPRESSION: Incomplete visualization of the lung bases. Opacity at the left lower lung region likely represents overlying packing material. There is postoperative change in the left lower hemithorax. Lungs elsewhere clear. Cardiac silhouette normal. Port-A-Cath tip in superior vena cava.  No pneumothorax. Electronically Signed   By: WiLowella GripII M.D.   On: 10/28/2019 12:45   DG Fluoro Guide CV Line-No Report  Result Date: 10/28/2019 Fluoroscopy was utilized by the requesting physician.  No radiographic interpretation.    ASSESSMENT & PLAN:   Carcinoma of upper-outer quadrant of left breast in female, estrogen receptor positive ([place]#Left breast cancer-2 separate primaries- A] T2N1-ER/PR positive HER-2 negative-status post reexcision negative margins.    B] T1N1 ER/PR positive HER-2 negative; superior/inferior positive margins-negative margins as per discussion with Dr. WaDonne Hazel #Discussed the treatment options of adjuvant therapy of chemotherapy; followed by radiation therapy followed by antihormone therapy.  #Had a long discussion with respect to type of adjuvant therapy-Adriamycin Cytoxan-followed by Taxol versus Taxotere Cytoxan.  Patient concerned about cardiac toxicity; risk of leukemias.  Given patient/family concerns-I think is reasonable to consider Cytoxan Taxotere.  Given the patient's concerns discussed option of-genomic testing in node positive disease.  Patient declines wants to move forward.   # Discussed the potential side effects including but not limited to-increasing  fatigue, nausea vomiting, diarrhea, hair loss, sores in the mouth, increase risk of infection and also neuropathy.  #After lengthy discussion patient feels more comfortable with Taxotere Cytoxan [given the concerns for leukemia and heart failure with Adriamycin.].   #Elevated alkaline phosphatase-160-200s-s/p extensive work-up [negative]-discussed with Dr. AnVicente Malesquestion related to fatty liver.  Monitor for now.  # DISPOSITION:  # in 1 week- MD; labs- cbc/cmp; Cytoxan-Taxotere- Dr.B  All questions were answered. The patient/family knows to call the clinic with any problems, questions or concerns.    GoCammie SickleMD 11/12/2019 7:10 AM

## 2019-11-06 NOTE — Assessment & Plan Note (Addendum)
#  Left breast cancer-2 separate primaries- A] T2N1-ER/PR positive HER-2 negative-status post reexcision negative margins.    B] T1N1 ER/PR positive HER-2 negative; superior/inferior positive margins-negative margins as per discussion with Dr. Donne Hazel.  #Discussed the treatment options of adjuvant therapy of chemotherapy; followed by radiation therapy followed by antihormone therapy.  #Had a long discussion with respect to type of adjuvant therapy-Adriamycin Cytoxan-followed by Taxol versus Taxotere Cytoxan.  Patient concerned about cardiac toxicity; risk of leukemias.  Given patient/family concerns-I think is reasonable to consider Cytoxan Taxotere.  Given the patient's concerns discussed option of-genomic testing in node positive disease.  Patient declines wants to move forward.   # Discussed the potential side effects including but not limited to-increasing fatigue, nausea vomiting, diarrhea, hair loss, sores in the mouth, increase risk of infection and also neuropathy.  #After lengthy discussion patient feels more comfortable with Taxotere Cytoxan [given the concerns for leukemia and heart failure with Adriamycin.].   #Elevated alkaline phosphatase-160-200s-s/p extensive work-up [negative]-discussed with Dr. Vicente Males; question related to fatty liver.  Monitor for now.  # DISPOSITION:  # in 1 week- MD; labs- cbc/cmp; Cytoxan-Taxotere- Dr.B

## 2019-11-06 NOTE — Progress Notes (Signed)
  Rescehduled

## 2019-11-07 ENCOUNTER — Ambulatory Visit (INDEPENDENT_AMBULATORY_CARE_PROVIDER_SITE_OTHER): Payer: No Typology Code available for payment source | Admitting: Gastroenterology

## 2019-11-07 DIAGNOSIS — R748 Abnormal levels of other serum enzymes: Secondary | ICD-10-CM | POA: Diagnosis not present

## 2019-11-07 NOTE — Progress Notes (Signed)
DISCONTINUE ON PATHWAY REGIMEN - Breast     Cycles 1 through 4: A cycle is every 21 days:     Doxorubicin      Cyclophosphamide    Cycles 5 through 16: A cycle is every 7 days:     Paclitaxel   **Always confirm dose/schedule in your pharmacy ordering system**  REASON: Other Reason PRIOR TREATMENT: [id number]: AC-T - [Doxorubicin + Cyclophosphamide q21 Days x 4 Cycles, Followed by Paclitaxel 80 mg/m2 Weekly x 12 Weeks] TREATMENT RESPONSE: Unable to Evaluate  START ON PATHWAY REGIMEN - Breast     A cycle is every 21 days:     Docetaxel      Cyclophosphamide   **Always confirm dose/schedule in your pharmacy ordering system**  Patient Characteristics: Postoperative without Neoadjuvant Therapy (Pathologic Staging), Invasive Disease, Adjuvant Therapy, HER2 Negative/Unknown/Equivocal, ER Positive, Node Positive, Node Positive (1-3), Genomic Testing Not Performed, Chemotherapy Candidate Therapeutic Status: Postoperative without Neoadjuvant Therapy (Pathologic Staging) AJCC Grade: G2 AJCC N Category: pN1a AJCC M Category: cM0 ER Status: Positive (+) AJCC 8 Stage Grouping: IB HER2 Status: Negative (-) Oncotype Dx Recurrence Score: Not Appropriate AJCC T Category: pT2 PR Status: Positive (+) Has this patient completed genomic testing<= No - Did Not Order Test  Intent of Therapy: Curative Intent, Discussed with Patient

## 2019-11-07 NOTE — Progress Notes (Signed)
Jessica Barnes , MD [address]  [place]  [place], [place] [address]  Main: [phone]  Fax: [phone]   Primary Care Physician: Tonia Ghent, MD  Virtual Visit via Telephone Note  I connected with patient on 11/07/19 at 10: 22 AM EDT by telephoneand verified that I am speaking with the correct person using two identifiers.  I discussed the limitations, risks, security and privacy concerns of performing an evaluation and management service by telephone and the availability of in person appointments. I also discussed with the patient that there may be a patient responsible charge related to this service. The patient expressed understanding and agreed to proceed.  Location of Patient: Home Location of Provider: Home Persons involved: Patient and provider only   History of Present Illness:  Follow-up for elevated alkaline phosphatase  HPI: Jessica Barnes is a 59 y.o. female   Summary of history :  Initially referred and seen on 09/13/2019 for abnormal labs and IBS symptoms. She sees Dr Ferne Coe the cancer center for breast cancer. Alkaline phosphatase was completely normal 1 year back but when recently checked about a monthprior to his initial visit in 1/2021was 171 with normal AST and ALT and total bilirubin. INR was 0.9. CBC demonstrated a hemoglobin of 12.3 g with a platelet count of 377 alkaline phosphatase isoenzymes were fractionated and were within normal limits as the individual components but elevated in total. Gamma GT was elevated at 179. CT scan of the abdomen pelvis in January 2021 demonstrated no metastatic disease in the abdomen. Possible focal steatosis was noted. Also mentioned that there is a strong concern for hepatic metastatic pre and postcontrast abdominal MRI could confirm this.  No family history or personal history of liver disease.No history of excess alcohol consumption. 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   Started Celexa very recently. Primary care doctor Buspar since 1 week. No herbal medication or dietary supplements.  09/19/2019: MRI liver- hepatic steatosis otherwise normal  Takes Tyelnol for heahaches sometimes upto 9 tablets a day in addition to advil . She has seen neurologists for the same.    Interval history2/25/2021-11/07/2019  10/10/2019:, GT 290 elevated, hep A total antibody negative, hepatitis B, hepatitis C, HIV negative.  Not immune to hepatitis B.  Autoimmune work-up, celiac serology, iron studies, immunoglobulins, celiac serology, alpha-1 antitrypsin levels, anti-LK M antibody were normal.  Calcium was low but normal PTH.Alk phos has been stable close to 200   No complaints presently        Current Outpatient Medications  Medication Sig Dispense Refill  . acetaminophen (TYLENOL) 500 MG tablet Take 1,000 mg by mouth every 6 (six) hours as needed for mild pain or headache.    . butalbital-acetaminophen-caffeine (FIORICET) 50-325-40 MG tablet TAKE 1 TABLET BY MOUTH EVERY 4 TO 6 HOURS AS NEEDED FOR HEADACHE 90 tablet 3  . diazepam (VALIUM) 10 MG tablet TAKE 1 TABLET BY MOUTH AT BEDTIME AS NEEDED FOR SLEEP 30 tablet 1  . dimenhyDRINATE (DRAMAMINE) 50 MG tablet Take 50 mg by mouth every 8 (eight) hours as needed.    Marland Kitchen FLUoxetine (PROZAC) 20 MG tablet Take 60 mg by mouth daily.    Marland Kitchen ibuprofen (ADVIL) 100 MG tablet Take 100 mg by mouth every 6 (six) hours as needed for fever.    . levothyroxine (SYNTHROID) 88 MCG tablet TAKE 1 TABLET BY MOUTH DAILY EXCEPT ON SUNDAYS TAKE 1/2 TABLET 90 tablet 1  . lidocaine-prilocaine (EMLA)  cream Apply 1 application topically as needed. 30 g 0  . loperamide (IMODIUM A-D) 2 MG tablet Take 4 mg by mouth 4 (four) times daily as needed for diarrhea or loose stools.    . ondansetron (ZOFRAN) 8 MG tablet One pill every 8 hours as needed for nausea/vomitting. 40 tablet 1  . oxyCODONE (OXY IR/ROXICODONE)  5 MG immediate release tablet Take 1 tablet (5 mg total) by mouth every 6 (six) hours as needed for moderate pain, severe pain or breakthrough pain. 10 tablet 0  . prochlorperazine (COMPAZINE) 10 MG tablet Take 1 tablet (10 mg total) by mouth every 6 (six) hours as needed for nausea or vomiting. 40 tablet 1   No current facility-administered medications for this visit.         Allergies as of 11/06/2019 - Review Complete 10/28/2019  Allergen Reaction Noted  . Sulfamethoxazole-trimethoprim  05/18/2009  . Codeine  03/29/2007    Review of Systems:    All systems reviewed and negative except where noted in HPI.   Observations/Objective:  Labs: CMP     Labs (Brief)          Component Value Date/Time   NA 138 08/14/2019 1544   K 3.4 (L) 08/14/2019 1544   CL 103 08/14/2019 1544   CO2 25 08/14/2019 1544   GLUCOSE 101 (H) 08/14/2019 1544   BUN 11 08/14/2019 1544   CREATININE 0.86 08/14/2019 1544   CALCIUM 8.6 (L) 10/10/2019 1325   PROT 6.5 10/10/2019 1325   ALBUMIN 4.0 10/10/2019 1325   AST 23 10/10/2019 1325   ALT 21 10/10/2019 1325   ALKPHOS 200 (H) 10/10/2019 1325   BILITOT <0.2 10/10/2019 1325   GFRNONAA >60 08/14/2019 1544   GFRAA >60 08/14/2019 1544     Recent Labs       Lab Results  Component Value Date   WBC 7.3 08/14/2019   HGB 12.3 08/14/2019   HCT 38.6 08/14/2019   MCV 93.7 08/14/2019   PLT 377 08/14/2019      Imaging Studies:  Imaging Results  NM Cardiac Muga Rest  Result Date: 10/22/2019 CLINICAL DATA:  Breast cancer. Evaluate cardiac function in relation to chemotherapy. EXAM: NUCLEAR MEDICINE CARDIAC BLOOD POOL IMAGING (MUGA) TECHNIQUE: Cardiac multi-gated acquisition was performed at rest following intravenous injection of Tc-77mlabeled red blood cells. RADIOPHARMACEUTICALS:  20.0 mCi Tc-952mertechnetate in-vitro labeled red blood cells IV COMPARISON:  None FINDINGS: No  focal wall motion abnormality of the left  ventricle. Calculated left ventricular ejection fraction equals 54.7% IMPRESSION: Left ventricular ejection fraction equals54.7 %. Electronically Signed   By: StSuzy Bouchard.D.   On: 10/22/2019 16:43   DG CHEST PORT 1 VIEW  Result Date: 10/28/2019 CLINICAL DATA:  Port-A-Cath placement EXAM: PORTABLE CHEST 1 VIEW COMPARISON:  March 31, 2010. FINDINGS: Note that the lung bases are not completely visualized on this study. Port-A-Cath tip is in the superior vena cava. No pneumothorax. There is apparent soft tissue opacity overlying the left lower lung region. Visualized lungs elsewhere clear. Heart size and pulmonary vascularity normal. No adenopathy. No bone lesions. Postoperative change noted left hemithorax. IMPRESSION: Incomplete visualization of the lung bases. Opacity at the left lower lung region likely represents overlying packing material. There is postoperative change in the left lower hemithorax. Lungs elsewhere clear. Cardiac silhouette normal. Port-A-Cath tip in superior vena cava.  No pneumothorax. Electronically Signed   By: WiLowella GripII M.D.   On: 10/28/2019 12:45   DG Fluoro Guide CV  Line-No Report  Result Date: 10/28/2019 Fluoroscopy was utilized by the requesting physician.  No radiographic interpretation.     Assessment and Plan:   Yaneliz Radebaugh Kirshenbaum is a 59 y.o. y/o female here to follow upfor elevated alkaline phosphatase levels which are isolated. Normal transaminases. This was completely normal about a year back. Gamma GT is elevated suggesting that it is of liver etiology.  MRI only shows hepatic steatosis and no other abnormalities of the liver.Full viral hepatitis and autoimmune work-up has been negative.  I would suggest at this point of time to limit the use of Tylenol, monitor alkaline phosphatase levels every 6 monthly.  If it rises to more than 2-1/2 times the upper limit of normal then may warrant a liver biopsy at that time.    Follow-up in 6 months with reepat LFT's and GGT   I discussed the assessment and treatment plan with the patient. The patient was provided an opportunity to ask questions and all were answered. The patient agreed with the plan and demonstrated an understanding of the instructions.  The patient was advised to call back or seek an in-person evaluation if the symptoms worsen or if the condition fails to improve as anticipated.  I provided 11 minutes of non-face-to-face time during this encounter.  Dr Jessica Bellows MD,MRCP [facility]) Gastroenterology/Hepatology Pager: [phone]   Speech recognition software was used to dictate this note.

## 2019-11-08 ENCOUNTER — Other Ambulatory Visit: Payer: Self-pay | Admitting: Family Medicine

## 2019-11-12 ENCOUNTER — Telehealth: Payer: Self-pay | Admitting: *Deleted

## 2019-11-12 MED ORDER — DEXAMETHASONE 4 MG PO TABS: ORAL_TABLET | ORAL | 0 refills | Status: DC

## 2019-11-12 NOTE — Telephone Encounter (Signed)
Husband instructed to pick up wife's decadron tablets. Medication instructions provided to patient's husband.

## 2019-11-13 ENCOUNTER — Inpatient Hospital Stay (HOSPITAL_BASED_OUTPATIENT_CLINIC_OR_DEPARTMENT_OTHER): Payer: No Typology Code available for payment source | Admitting: Internal Medicine

## 2019-11-13 ENCOUNTER — Encounter: Payer: Self-pay | Admitting: Internal Medicine

## 2019-11-13 ENCOUNTER — Other Ambulatory Visit: Payer: Self-pay

## 2019-11-13 ENCOUNTER — Inpatient Hospital Stay: Payer: No Typology Code available for payment source

## 2019-11-13 ENCOUNTER — Encounter: Payer: Self-pay | Admitting: *Deleted

## 2019-11-13 VITALS — BP 112/73 | HR 97 | Resp 18

## 2019-11-13 DIAGNOSIS — C50412 Malignant neoplasm of upper-outer quadrant of left female breast: Secondary | ICD-10-CM

## 2019-11-13 DIAGNOSIS — Z170 Estrogen receptor positive status [ER+]: Secondary | ICD-10-CM

## 2019-11-13 LAB — CBC WITH DIFFERENTIAL/PLATELET
Abs Immature Granulocytes: 0.02 10*3/uL (ref 0.00–0.07)
Basophils Absolute: 0.1 10*3/uL (ref 0.0–0.1)
Basophils Relative: 1 %
Eosinophils Absolute: 0.2 10*3/uL (ref 0.0–0.5)
Eosinophils Relative: 3 %
HCT: 40.9 % (ref 36.0–46.0)
Hemoglobin: 13.1 g/dL (ref 12.0–15.0)
Immature Granulocytes: 0 %
Lymphocytes Relative: 34 %
Lymphs Abs: 3 10*3/uL (ref 0.7–4.0)
MCH: 29.8 pg (ref 26.0–34.0)
MCHC: 32 g/dL (ref 30.0–36.0)
MCV: 93.2 fL (ref 80.0–100.0)
Monocytes Absolute: 0.8 10*3/uL (ref 0.1–1.0)
Monocytes Relative: 8 %
Neutro Abs: 4.8 10*3/uL (ref 1.7–7.7)
Neutrophils Relative %: 54 %
Platelets: 432 10*3/uL — ABNORMAL HIGH (ref 150–400)
RBC: 4.39 MIL/uL (ref 3.87–5.11)
RDW: 12.6 % (ref 11.5–15.5)
WBC: 8.9 10*3/uL (ref 4.0–10.5)
nRBC: 0 % (ref 0.0–0.2)

## 2019-11-13 LAB — COMPREHENSIVE METABOLIC PANEL
ALT: 75 U/L — ABNORMAL HIGH (ref 0–44)
AST: 67 U/L — ABNORMAL HIGH (ref 15–41)
Albumin: 4.1 g/dL (ref 3.5–5.0)
Alkaline Phosphatase: 227 U/L — ABNORMAL HIGH (ref 38–126)
Anion gap: 8 (ref 5–15)
BUN: 12 mg/dL (ref 6–20)
CO2: 24 mmol/L (ref 22–32)
Calcium: 9.4 mg/dL (ref 8.9–10.3)
Chloride: 103 mmol/L (ref 98–111)
Creatinine, Ser: 0.76 mg/dL (ref 0.44–1.00)
GFR calc Af Amer: >60 mL/min (ref >60)
GFR calc non Af Amer: >60 mL/min (ref >60)
Glucose, Bld: 95 mg/dL (ref 70–99)
Potassium: 4 mmol/L (ref 3.5–5.1)
Sodium: 135 mmol/L (ref 135–145)
Total Bilirubin: 0.4 mg/dL (ref 0.3–1.2)
Total Protein: 7.7 g/dL (ref 6.5–8.1)

## 2019-11-13 MED ORDER — SODIUM CHLORIDE 0.9 % IV SOLN
60.0000 mg/m2 | Freq: Once | INTRAVENOUS | Status: AC
  Administered 2019-11-13: 100 mg via INTRAVENOUS
  Filled 2019-11-13: qty 10

## 2019-11-13 MED ORDER — LORAZEPAM 0.5 MG PO TABS
1.0000 mg | ORAL_TABLET | Freq: Once | ORAL | Status: AC
  Administered 2019-11-13: 1 mg via ORAL
  Filled 2019-11-13: qty 2

## 2019-11-13 MED ORDER — PALONOSETRON HCL INJECTION 0.25 MG/5ML
0.2500 mg | Freq: Once | INTRAVENOUS | Status: AC
  Administered 2019-11-13: 0.25 mg via INTRAVENOUS
  Filled 2019-11-13: qty 5

## 2019-11-13 MED ORDER — SODIUM CHLORIDE 0.9 % IV SOLN
10.0000 mg | Freq: Once | INTRAVENOUS | Status: AC
  Administered 2019-11-13: 10 mg via INTRAVENOUS
  Filled 2019-11-13: qty 10

## 2019-11-13 MED ORDER — HEPARIN SOD (PORK) LOCK FLUSH 100 UNIT/ML IV SOLN
500.0000 [IU] | Freq: Once | INTRAVENOUS | Status: AC | PRN
  Administered 2019-11-13: 500 [IU]
  Filled 2019-11-13: qty 5

## 2019-11-13 MED ORDER — SODIUM CHLORIDE 0.9 % IV SOLN
1000.0000 mg | Freq: Once | INTRAVENOUS | Status: AC
  Administered 2019-11-13: 1000 mg via INTRAVENOUS
  Filled 2019-11-13: qty 50

## 2019-11-13 MED ORDER — SODIUM CHLORIDE 0.9% FLUSH
10.0000 mL | INTRAVENOUS | Status: DC | PRN
  Administered 2019-11-13: 10 mL
  Filled 2019-11-13: qty 10

## 2019-11-13 MED ORDER — HEPARIN SOD (PORK) LOCK FLUSH 100 UNIT/ML IV SOLN
INTRAVENOUS | Status: AC
  Filled 2019-11-13: qty 5

## 2019-11-13 MED ORDER — SODIUM CHLORIDE 0.9 % IV SOLN
Freq: Once | INTRAVENOUS | Status: AC
  Filled 2019-11-13: qty 250

## 2019-11-13 NOTE — Assessment & Plan Note (Addendum)
#  Left breast cancer-2 separate primaries- A] T2N1-ER/PR positive HER-2 negative;  B] T1N1 ER/PR positive HER-2 negative.  #Proceed with adjuvant chemotherapy Taxotere [60 mg/m] Cytoxan q. every 3 weeks x 4 proceed with cycle #1. Labs today reviewed;  acceptable for treatment today-slightly elevated LFTs [see below].   #Again reviewed the potential side effects including but not limited to diarrhea nausea vomiting-reviewed the antiemetics.   # Growth factor- would be given as prophylaxis for chemotherapy-induced neutropenia to prevent febrile neutropenias. Discussed potential side effect- myalgias/arthralgias- recommend Claritin for 7days.    #Chronic hypokalemia/diarrhea-recommend continued potassium supplementation; Imodium as needed resting rate is 4.  #Anxiety-recommend adding Ativan prior to chemotherapy.  Discussed with patient.  #Elevated LFTs- alkaline phosphatase-160-200s; AST/ALT- 60-70s; normal bilirubin-s/p extensive work-up [negative]- question related to fatty liver.  Monitor for now.  # DISPOSITION:  # Chemo today; # D-2 fulphila # Follow up in 1 week- [facility]- labs- cbc/bmp; possible IVfs over 1 hour # in 3 week- MD; labs- cbc/cmp; Cytoxan-Taxotere; D-2 fulphila- Dr.B

## 2019-11-13 NOTE — Progress Notes (Signed)
[facility] [place] [facility] NOTE  Patient Care Team: Tonia Ghent, [name], [name]  #  Oncology History Overview Note  # DEC 2020- LEFT BREAST-TWO PRIM ARIES:   #A]-IMC; ER/PR >90%; Her 2-IHC-2+; FISH-NEG-2 ; MRI- 3.7 CM; s/p s/p lumpectomy; s/p reexcision-final margins negative.  Stage Ib.    #B] Bx- IMC; G-1; ER/PR-pos; her2 NEG. s/p lumpectomy; margins negative [as per Dr.Wake[place]; discussed at [facility]The [facility] conference]  # MARCH 31st 2021- START Taxotere [60 mg/m]; Cytoxan; growth factor  #Extreme anxiety/chronic diarrhea/chronic severe hypokalemia/history of migraines/depression  Used estrogen and progesterone therapy: on premarin for post- menopausal x 2-3 years- discontinued in JAN 2021.   # SURVIVORSHIP:   DIAGNOSIS: Breast cancer  STAGE: IB & I     ;  GOALS: Cure  CURRENT/MOST RECENT THERAPY : TC    Carcinoma of upper-outer quadrant of left breast in female, estrogen receptor positive ([place])  08/14/2019 Initial Diagnosis   Carcinoma of upper-outer quadrant of left breast in female, estrogen receptor positive ([place])   09/10/2019 Genetic Testing   Negative genetic testing on the STAT and common hereditary cancer panel.  The Common Hereditary Gene Panel offered by [facility] includes sequencing and/or deletion duplication testing of the following 48 genes: APC, ATM, AXIN2, BARD1, BMPR1A, BRCA1, BRCA2, BRIP1, CDH1, CDK4, CDKN2A (p14ARF), CDKN2A (p16INK4a), CHEK2, CTNNA1, DICER1, EPCAM (Deletion/duplication testing only), GREM1 (promoter region deletion/duplication testing only), KIT, MEN1, MLH1, MSH2, MSH3, MSH6, MUTYH, NBN, NF1, NHTL1, PALB2, PDGFRA, PMS2, POLD1, POLE, PTEN, RAD50, RAD51C, RAD51D, RNF43, SDHB, SDHC, SDHD, SMAD4, SMARCA4. STK11, TP53, TSC1, TSC2, and VHL.  The following genes were evaluated for sequence changes only: SDHA and HOXB13  c.251G>A variant only. The report date is September 10, 2019.   10/03/2019 Cancer Staging   Staging form: Breast, AJCC 8th Edition - Pathologic stage from 10/03/2019: Stage IB (pT2, pN1a, cM0, G2, ER+, PR+, HER2-) - Signed by Gardenia Phlegm, NP on 10/16/2019   10/09/2019 - 10/09/2019 Chemotherapy   The patient had DOXOrubicin (ADRIAMYCIN) chemo injection 98 mg, 60 mg/m2, Intravenous,  Once, 0 of 4 cycles PALONOSETRON HCL INJECTION 0.25 MG/5ML, 0.25 mg, Intravenous,  Once, 0 of 4 cycles pegfilgrastim-jmdb (FULPHILA) injection 6 mg, 6 mg, Subcutaneous,  Once, 0 of 4 cycles cyclophosphamide (CYTOXAN) 980 mg in sodium chloride 0.9 % 250 mL chemo infusion, 600 mg/m2, Intravenous,  Once, 0 of 4 cycles PACLitaxel (TAXOL) 132 mg in sodium chloride 0.9 % 250 mL chemo infusion (</= 64m/m2), 80 mg/m2, Intravenous,  Once, 0 of 12 cycles FOSAPREPITANT IV INFUSION 150 MG, 150 mg, Intravenous,  Once, 0 of 4 cycles  for chemotherapy treatment.    11/13/2019 -  Chemotherapy   The patient had palonosetron (ALOXI) injection 0.25 mg, 0.25 mg, Intravenous,  Once, 0 of 4 cycles pegfilgrastim-jmdb (FULPHILA) injection 6 mg, 6 mg, Subcutaneous,  Once, 0 of 4 cycles cyclophosphamide (CYTOXAN) 980 mg in sodium chloride 0.9 % 250 mL chemo infusion, 600 mg/m2 = 980 mg, Intravenous,  Once, 0 of 4 cycles DOCEtaxel (TAXOTERE) 100 mg in sodium chloride 0.9 % 250 mL chemo infusion, 60 mg/m2 = 100 mg (100 % of original dose 60 mg/m2), Intravenous,  Once, 0 of 4 cycles Dose modification: 60 mg/m2 (original dose 60 mg/m2, Cycle 1, Reason: Provider Judgment)  for chemotherapy treatment.       HISTORY OF PRESENTING ILLNESS:  CEnijah FurrPoythress 59y.o.  female  with of breast cancer  [T2N1-ER/PR positive HER-2 negative; T1N1-ER/PR positive HER-2 negative 2 separate primaries] is here for follow-up.  Patient is here to proceed with adjuvant chemotherapy.  Patient did not fill her dexamethasone premedication prior to  Taxotere because pharmacy was closed.  Complains of anxiety.  Otherwise no shortness of breath or cough.  No abdominal pain.  No worsening diarrhea.  No nausea or vomiting.   Review of Systems  Constitutional: Negative for chills, diaphoresis, fever, malaise/fatigue and weight loss.  HENT: Negative for nosebleeds and sore throat.   Eyes: Negative for double vision.  Respiratory: Negative for cough, hemoptysis, sputum production, shortness of breath and wheezing.   Cardiovascular: Negative for chest pain, palpitations, orthopnea and leg swelling.  Gastrointestinal: Negative for abdominal pain, blood in stool, constipation, diarrhea, heartburn, melena, nausea and vomiting.  Genitourinary: Negative for dysuria, frequency and urgency.  Musculoskeletal: Negative for back pain and joint pain.  Skin: Negative.  Negative for itching and rash.  Neurological: Positive for headaches. Negative for dizziness, tingling, focal weakness and weakness.  Endo/Heme/Allergies: Does not bruise/bleed easily.  Psychiatric/Behavioral: Positive for depression. The patient is nervous/anxious. The patient does not have insomnia.      MEDICAL HISTORY:  Past Medical History:  Diagnosis Date  . Anxiety 10/13/96  . Chest pain    atypical, hosp for that and depression 8/10-8/11/08  . COPD (chronic obstructive pulmonary disease) ([place]) 09/15/05   by x-ray  . Depression 10/13/96   hospital 8/10-8/11/08.  notable history: son with TBI after MVA  . Family history of breast cancer   . Family history of colon cancer   . Family history of pancreatic cancer   . Hyperlipidemia 09/15/98  . Hypothyroidism 01/14/96  . Migraine    27 yoa  . NSVD (normal spontaneous vaginal delivery)    x 1  . PONV (postoperative nausea and vomiting)   . PTSD (post-traumatic stress disorder)    likely PTSD after son's MVA/TBI in 2007, s/p counseling    SURGICAL HISTORY: Past Surgical History:  Procedure Laterality Date  . APPENDECTOMY     59  years of age  . BREAST BIOPSY Left 08/06/2019   [place] bx/ ribbon clip/ path pending  . BREAST BIOPSY Left 09/06/2019   [place] path pending heart clip  . BREAST LUMPECTOMY WITH RADIOACTIVE SEED AND SENTINEL LYMPH NODE BIOPSY Left 10/01/2019   Procedure: LEFT BREAST LUMPECTOMY WITH BRACKETED RADIOACTIVE SEEDS AND LEFT AXILLARY SENTINEL LYMPH NODE BIOPSY;  Surgeon: Rolm Bookbinder, MD;  Location: [facility];  Service: General;  Laterality: Left;  . PORTACATH PLACEMENT Right 10/28/2019   Procedure: INSERTION PORT-A-CATH WITH ULTRASOUND GUIDANCE;  Surgeon: Rolm Bookbinder, MD;  Location: [facility];  Service: General;  Laterality: Right;  . RE-EXCISION OF BREAST LUMPECTOMY Left 10/28/2019   Procedure: RE-EXCISION OF LEFT BREAST LUMPECTOMY;  Surgeon: Rolm Bookbinder, MD;  Location: [facility];  Service: General;  Laterality: Left;    SOCIAL HISTORY: Social History   Socioeconomic History  . Marital status: Married    Spouse name: Not on file  . Number of children: Not on file  . Years of education: Not on file  . Highest education level: Not on file  Occupational History  . Not on file  Tobacco Use  . Smoking status: Former Smoker    Packs/day: 0.50    Years: 8.00    Pack years: 4.00    Types: Cigarettes  . Smokeless tobacco: Never Used  . Tobacco comment:  quit 04/15/2014  Substance and Sexual Activity  . Alcohol use: No    Alcohol/week: 0.0 standard drinks  . Drug use: No  . Sexual activity: Not on file  Other Topics Concern  . Not on file  Social History Narrative   Remarried, lives with husband 1 son, he had TBI and short term memory loss after prolonged illness      #Patient lives in [place] with her husband; prior history of smoking/currently vapes.  No significant alcohol use.  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    Social Determinants of Health   Financial Resource Strain:   . Difficulty of Paying Living Expenses:    Food Insecurity:   . Worried About Charity fundraiser in the Last Year:   . Arboriculturist in the Last Year:   Transportation Needs:   . Film/video editor (Medical):   Marland Kitchen Lack of Transportation (Non-Medical):   Physical Activity:   . Days of Exercise per Week:   . Minutes of Exercise per Session:   Stress:   . Feeling of Stress :   Social Connections:   . Frequency of Communication with Friends and Family:   . Frequency of Social Gatherings with Friends and Family:   . Attends Religious Services:   . Active Member of Clubs or Organizations:   . Attends Archivist Meetings:   Marland Kitchen Marital Status:   Intimate Partner Violence:   . Fear of Current or Ex-Partner:   . Emotionally Abused:   Marland Kitchen Physically Abused:   . Sexually Abused:     FAMILY HISTORY: Family History  Problem Relation Age of Onset  . Hypertension Mother   . Colon cancer Father        d. 30  . Thyroid disease Sister   . Breast cancer Paternal Aunt   . Breast cancer Paternal Grandmother   . Colon cancer Paternal Grandmother   . Breast cancer Cousin        paternal aunt's daughter  . Colon cancer Paternal Uncle        3 pat uncles with colon cancer at unknown ages  . Pancreatic cancer Maternal Uncle        d. 58  . Breast cancer Paternal Aunt     ALLERGIES:  is allergic to sulfamethoxazole-trimethoprim and codeine.  MEDICATIONS:  Current Outpatient Medications  Medication Sig Dispense Refill  . acetaminophen (TYLENOL) 500 MG tablet Take 1,000 mg by mouth every 6 (six) hours as needed for mild pain or headache.    . butalbital-acetaminophen-caffeine (FIORICET) 50-325-40 MG tablet TAKE 1 TABLET BY MOUTH EVERY 4 TO 6 HOURS AS NEEDED FOR HEADACHE 90 tablet 3  . dexamethasone (DECADRON) 4 MG tablet 1 tablet AM & PM; Take Day prior and day after chemo. 30 tablet 0  . diazepam (VALIUM) 10 MG tablet TAKE 1 TABLET BY MOUTH AT BEDTIME AS NEEDED FOR SLEEP 30 tablet 1  . dimenhyDRINATE (DRAMAMINE) 50 MG  tablet Take 50 mg by mouth every 8 (eight) hours as needed.    Marland Kitchen FLUoxetine (PROZAC) 20 MG capsule TAKE 3 CAPSULES BY MOUTH DAILY 270 capsule 1  . FLUoxetine (PROZAC) 20 MG tablet Take 60 mg by mouth daily.    Marland Kitchen ibuprofen (ADVIL) 100 MG tablet Take 100 mg by mouth every 6 (six) hours as needed for fever.    . levothyroxine (SYNTHROID) 88 MCG tablet TAKE 1 TABLET BY MOUTH DAILY EXCEPT ON SUNDAYS TAKE 1/2 TABLET 90 tablet 1  .  lidocaine-prilocaine (EMLA) cream Apply 1 application topically as needed. 30 g 0  . loperamide (IMODIUM A-D) 2 MG tablet Take 4 mg by mouth 4 (four) times daily as needed for diarrhea or loose stools.    . ondansetron (ZOFRAN) 8 MG tablet One pill every 8 hours as needed for nausea/vomitting. 40 tablet 1  . oxyCODONE (OXY IR/ROXICODONE) 5 MG immediate release tablet Take 1 tablet (5 mg total) by mouth every 6 (six) hours as needed for moderate pain, severe pain or breakthrough pain. 10 tablet 0  . potassium chloride SA (KLOR-CON) 20 MEQ tablet 1 pill twice a day 60 tablet 3  . prochlorperazine (COMPAZINE) 10 MG tablet Take 1 tablet (10 mg total) by mouth every 6 (six) hours as needed for nausea or vomiting. 40 tablet 1   No current facility-administered medications for this visit.      Marland Kitchen  PHYSICAL EXAMINATION: ECOG PERFORMANCE STATUS: 0 - Asymptomatic  Vitals:   11/13/19 0843  BP: (!) 108/58  Pulse: (!) 103  Temp: (!) 96.1 F (35.6 C)  SpO2: 97%   Filed Weights   11/13/19 0843  Weight: 127 lb (57.6 kg)    Physical Exam  Constitutional: She is oriented to person, place, and time and well-developed, well-nourished, and in no distress.  HENT:  Head: Normocephalic and atraumatic.  Mouth/Throat: Oropharynx is clear and moist. No oropharyngeal exudate.  Eyes: Pupils are equal, round, and reactive to light.  Cardiovascular: Normal rate and regular rhythm.  Pulmonary/Chest: Effort normal and breath sounds normal. No respiratory distress. She has no wheezes.   Abdominal: Soft. Bowel sounds are normal. She exhibits no distension and no mass. There is no abdominal tenderness. There is no rebound and no guarding.  Musculoskeletal:        General: No tenderness or edema. Normal range of motion.     Cervical back: Normal range of motion and neck supple.  Neurological: She is alert and oriented to person, place, and time.  Skin: Skin is warm.  Psychiatric: Affect normal.  Anxious   LABORATORY DATA:  I have reviewed the data as listed Lab Results  Component Value Date   WBC 8.9 11/13/2019   HGB 13.1 11/13/2019   HCT 40.9 11/13/2019   MCV 93.2 11/13/2019   PLT 432 (H) 11/13/2019   Recent Labs    08/14/19 1544 08/14/19 1544 09/06/19 1054 10/10/19 1325 11/06/19 0950 11/13/19 0831  NA 138  --   --   --  138 135  K 3.4*  --   --   --  2.8* 4.0  CL 103  --   --   --  105 103  CO2 25  --   --   --  24 24  GLUCOSE 101*  --   --   --  115* 95  BUN 11  --   --   --  11 12  CREATININE 0.86  --   --   --  0.80 0.76  CALCIUM 8.8*   < >  --  8.6* 8.5* 9.4  GFRNONAA >60  --   --   --  >60 >60  GFRAA >60  --   --   --  >60 >60  PROT 7.6   < > 7.6 6.5 6.9 7.7  ALBUMIN 3.7   < > 3.8 4.0 3.7 4.1  AST 21   < > _0 67*  ALT 17   < > _1 75*  ALKPHOS 171*   < >  174* 200* 194* 227*  BILITOT 0.4   < > 0.4 <0.2 0.3 0.4  BILIDIR  --   --  <0.1 0.07  --   --   IBILI  --   --  NOT CALCULATED  --   --   --    < > = values in this interval not displayed.    RADIOGRAPHIC STUDIES: I have personally reviewed the radiological images as listed and agreed with the findings in the report. NM Cardiac Muga Rest  Result Date: 10/22/2019 CLINICAL DATA:  Breast cancer. Evaluate cardiac function in relation to chemotherapy. EXAM: NUCLEAR MEDICINE CARDIAC BLOOD POOL IMAGING (MUGA) TECHNIQUE: Cardiac multi-gated acquisition was performed at rest following intravenous injection of Tc-1mlabeled red blood cells. RADIOPHARMACEUTICALS:  20.0 mCi Tc-946mpertechnetate in-vitro labeled red blood cells IV COMPARISON:  None FINDINGS: No  focal wall motion abnormality of the left ventricle. Calculated left ventricular ejection fraction equals 54.7% IMPRESSION: Left ventricular ejection fraction equals54.7 %. Electronically Signed   By: StSuzy Bouchard.D.   On: 10/22/2019 16:43   DG CHEST PORT 1 VIEW  Result Date: 10/28/2019 CLINICAL DATA:  Port-A-Cath placement EXAM: PORTABLE CHEST 1 VIEW COMPARISON:  March 31, 2010. FINDINGS: Note that the lung bases are not completely visualized on this study. Port-A-Cath tip is in the superior vena cava. No pneumothorax. There is apparent soft tissue opacity overlying the left lower lung region. Visualized lungs elsewhere clear. Heart size and pulmonary vascularity normal. No adenopathy. No bone lesions. Postoperative change noted left hemithorax. IMPRESSION: Incomplete visualization of the lung bases. Opacity at the left lower lung region likely represents overlying packing material. There is postoperative change in the left lower hemithorax. Lungs elsewhere clear. Cardiac silhouette normal. Port-A-Cath tip in superior vena cava.  No pneumothorax. Electronically Signed   By: WiLowella GripII M.D.   On: 10/28/2019 12:45   DG Fluoro Guide CV Line-No Report  Result Date: 10/28/2019 Fluoroscopy was utilized by the requesting physician.  No radiographic interpretation.    ASSESSMENT & PLAN:   Carcinoma of upper-outer quadrant of left breast in female, estrogen receptor positive ([place]# Left breast cancer-2 separate primaries- A] T2N1-ER/PR positive HER-2 negative;  B] T1N1 ER/PR positive HER-2 negative.  #Proceed with adjuvant chemotherapy Taxotere [60 mg/m] Cytoxan q. every 3 weeks x 4 proceed with cycle #1. Labs today reviewed;  acceptable for treatment today-slightly elevated LFTs [see below].   #Again reviewed the potential side effects including but not limited to diarrhea nausea vomiting-reviewed the  antiemetics.   # Growth factor- would be given as prophylaxis for chemotherapy-induced neutropenia to prevent febrile neutropenias. Discussed potential side effect- myalgias/arthralgias- recommend Claritin for 7days.    #Chronic hypokalemia/diarrhea-recommend continued potassium supplementation; Imodium as needed resting rate is 4.  #Anxiety-recommend adding Ativan prior to chemotherapy.  Discussed with patient.  #Elevated LFTs- alkaline phosphatase-160-200s; AST/ALT- 60-70s; normal bilirubin-s/p extensive work-up [negative]- question related to fatty liver.  Monitor for now.  # DISPOSITION:  # Chemo today; # D-2 fulphila # [facility]; possible IVfs over 1 hour # in 3 week- MD; labs- cbc/cmp; Cytoxan-Taxotere; D-2 fulphila- Dr.B  All questions were answered. The patient/family knows to call the clinic with any problems, questions or concerns.    GoCammie SickleMD 11/13/2019 9:39 AM

## 2019-11-13 NOTE — Patient Instructions (Signed)
#  Take Claritin 1 a day x 7 days.  Start the day of chemo [this is to help with the body aches that could be caused by the booster injection]

## 2019-11-14 ENCOUNTER — Inpatient Hospital Stay: Payer: No Typology Code available for payment source | Attending: Internal Medicine

## 2019-11-14 ENCOUNTER — Telehealth: Payer: Self-pay

## 2019-11-14 DIAGNOSIS — R7989 Other specified abnormal findings of blood chemistry: Secondary | ICD-10-CM | POA: Insufficient documentation

## 2019-11-14 DIAGNOSIS — E785 Hyperlipidemia, unspecified: Secondary | ICD-10-CM | POA: Insufficient documentation

## 2019-11-14 DIAGNOSIS — Z79899 Other long term (current) drug therapy: Secondary | ICD-10-CM | POA: Diagnosis not present

## 2019-11-14 DIAGNOSIS — E860 Dehydration: Secondary | ICD-10-CM | POA: Insufficient documentation

## 2019-11-14 DIAGNOSIS — Z87891 Personal history of nicotine dependence: Secondary | ICD-10-CM | POA: Insufficient documentation

## 2019-11-14 DIAGNOSIS — F418 Other specified anxiety disorders: Secondary | ICD-10-CM | POA: Diagnosis not present

## 2019-11-14 DIAGNOSIS — E876 Hypokalemia: Secondary | ICD-10-CM | POA: Insufficient documentation

## 2019-11-14 DIAGNOSIS — Z170 Estrogen receptor positive status [ER+]: Secondary | ICD-10-CM | POA: Diagnosis not present

## 2019-11-14 DIAGNOSIS — Z7689 Persons encountering health services in other specified circumstances: Secondary | ICD-10-CM | POA: Insufficient documentation

## 2019-11-14 DIAGNOSIS — R197 Diarrhea, unspecified: Secondary | ICD-10-CM | POA: Insufficient documentation

## 2019-11-14 DIAGNOSIS — F419 Anxiety disorder, unspecified: Secondary | ICD-10-CM | POA: Insufficient documentation

## 2019-11-14 DIAGNOSIS — Z9221 Personal history of antineoplastic chemotherapy: Secondary | ICD-10-CM | POA: Diagnosis not present

## 2019-11-14 DIAGNOSIS — R140 Abdominal distension (gaseous): Secondary | ICD-10-CM | POA: Insufficient documentation

## 2019-11-14 DIAGNOSIS — C50412 Malignant neoplasm of upper-outer quadrant of left female breast: Secondary | ICD-10-CM | POA: Diagnosis present

## 2019-11-14 DIAGNOSIS — J449 Chronic obstructive pulmonary disease, unspecified: Secondary | ICD-10-CM | POA: Insufficient documentation

## 2019-11-14 DIAGNOSIS — K580 Irritable bowel syndrome with diarrhea: Secondary | ICD-10-CM | POA: Insufficient documentation

## 2019-11-14 DIAGNOSIS — Z803 Family history of malignant neoplasm of breast: Secondary | ICD-10-CM | POA: Diagnosis not present

## 2019-11-14 DIAGNOSIS — E039 Hypothyroidism, unspecified: Secondary | ICD-10-CM | POA: Diagnosis not present

## 2019-11-14 DIAGNOSIS — Z800 Family history of malignant neoplasm of digestive organs: Secondary | ICD-10-CM | POA: Insufficient documentation

## 2019-11-14 MED ORDER — PEGFILGRASTIM-JMDB 6 MG/0.6ML ~~LOC~~ SOSY
6.0000 mg | PREFILLED_SYRINGE | Freq: Once | SUBCUTANEOUS | Status: AC
  Administered 2019-11-14: 6 mg via SUBCUTANEOUS
  Filled 2019-11-14: qty 0.6

## 2019-11-14 NOTE — Telephone Encounter (Signed)
Telephone call to patient for follow up after receiving first infusion.   Patients husband states infusion went great.  States pt is eating good and drinking plenty of fluids.   Denies any nausea or vomiting.  Encouraged patient husband to call for any concerns or questions.

## 2019-11-15 ENCOUNTER — Encounter: Payer: Self-pay | Admitting: *Deleted

## 2019-11-15 NOTE — Progress Notes (Signed)
Called patient post chemotherapy on Wednesday.  Talked to her husband Shanon Brow.  He states she has done really well since chemo.  They are to call with any questions or needs.

## 2019-11-15 NOTE — Progress Notes (Signed)
Met patient during her first chemo.   Offered support.  She is to call with any questions or needs.

## 2019-11-18 ENCOUNTER — Telehealth: Payer: Self-pay | Admitting: *Deleted

## 2019-11-18 NOTE — Telephone Encounter (Signed)
RN contacted patient Post chemotherapy. Spoke with husband. Patient not available per husband. Patient only c/o "mild fatigue / generalized body aches as expected from the neulasta" per husband). Husband states that he would give the patient the msg that we called to follow-up.

## 2019-11-20 ENCOUNTER — Inpatient Hospital Stay: Payer: No Typology Code available for payment source

## 2019-11-20 ENCOUNTER — Inpatient Hospital Stay (HOSPITAL_BASED_OUTPATIENT_CLINIC_OR_DEPARTMENT_OTHER): Payer: No Typology Code available for payment source | Admitting: Oncology

## 2019-11-20 ENCOUNTER — Other Ambulatory Visit: Payer: Self-pay

## 2019-11-20 ENCOUNTER — Encounter: Payer: Self-pay | Admitting: Oncology

## 2019-11-20 VITALS — BP 94/66 | HR 98 | Temp 99.5°F | Resp 16

## 2019-11-20 DIAGNOSIS — C50412 Malignant neoplasm of upper-outer quadrant of left female breast: Secondary | ICD-10-CM | POA: Diagnosis not present

## 2019-11-20 DIAGNOSIS — E860 Dehydration: Secondary | ICD-10-CM

## 2019-11-20 DIAGNOSIS — Z95828 Presence of other vascular implants and grafts: Secondary | ICD-10-CM

## 2019-11-20 DIAGNOSIS — R112 Nausea with vomiting, unspecified: Secondary | ICD-10-CM

## 2019-11-20 DIAGNOSIS — R197 Diarrhea, unspecified: Secondary | ICD-10-CM | POA: Diagnosis not present

## 2019-11-20 DIAGNOSIS — Z170 Estrogen receptor positive status [ER+]: Secondary | ICD-10-CM

## 2019-11-20 LAB — CBC WITH DIFFERENTIAL/PLATELET
Abs Immature Granulocytes: 0.2 10*3/uL — ABNORMAL HIGH (ref 0.00–0.07)
Band Neutrophils: 4 %
Basophils Absolute: 0 10*3/uL (ref 0.0–0.1)
Basophils Relative: 0 %
Eosinophils Absolute: 0 10*3/uL (ref 0.0–0.5)
Eosinophils Relative: 0 %
HCT: 38.2 % (ref 36.0–46.0)
Hemoglobin: 12.2 g/dL (ref 12.0–15.0)
Lymphocytes Relative: 63 %
Lymphs Abs: 2.8 10*3/uL (ref 0.7–4.0)
MCH: 29.7 pg (ref 26.0–34.0)
MCHC: 31.9 g/dL (ref 30.0–36.0)
MCV: 92.9 fL (ref 80.0–100.0)
Metamyelocytes Relative: 2 %
Monocytes Absolute: 0.7 10*3/uL (ref 0.1–1.0)
Monocytes Relative: 16 %
Myelocytes: 3 %
Neutro Abs: 0.7 10*3/uL — ABNORMAL LOW (ref 1.7–7.7)
Neutrophils Relative %: 12 %
Platelets: 285 10*3/uL (ref 150–400)
RBC: 4.11 MIL/uL (ref 3.87–5.11)
RDW: 12.4 % (ref 11.5–15.5)
Smear Review: ADEQUATE
WBC: 4.5 10*3/uL (ref 4.0–10.5)
nRBC: 0.4 % — ABNORMAL HIGH (ref 0.0–0.2)

## 2019-11-20 LAB — BASIC METABOLIC PANEL
Anion gap: 12 (ref 5–15)
BUN: 14 mg/dL (ref 6–20)
CO2: 19 mmol/L — ABNORMAL LOW (ref 22–32)
Calcium: 8.9 mg/dL (ref 8.9–10.3)
Chloride: 104 mmol/L (ref 98–111)
Creatinine, Ser: 1.03 mg/dL — ABNORMAL HIGH (ref 0.44–1.00)
GFR calc Af Amer: >60 mL/min (ref >60)
GFR calc non Af Amer: 59 mL/min — ABNORMAL LOW (ref >60)
Glucose, Bld: 158 mg/dL — ABNORMAL HIGH (ref 70–99)
Potassium: 3.5 mmol/L (ref 3.5–5.1)
Sodium: 135 mmol/L (ref 135–145)

## 2019-11-20 MED ORDER — DEXAMETHASONE SODIUM PHOSPHATE 10 MG/ML IJ SOLN
10.0000 mg | Freq: Once | INTRAMUSCULAR | Status: AC
  Administered 2019-11-20: 10 mg via INTRAVENOUS
  Filled 2019-11-20: qty 1

## 2019-11-20 MED ORDER — ONDANSETRON HCL 4 MG/2ML IJ SOLN
8.0000 mg | Freq: Once | INTRAMUSCULAR | Status: AC
  Administered 2019-11-20: 8 mg via INTRAVENOUS
  Filled 2019-11-20: qty 4

## 2019-11-20 MED ORDER — HEPARIN SOD (PORK) LOCK FLUSH 100 UNIT/ML IV SOLN
500.0000 [IU] | Freq: Once | INTRAVENOUS | Status: DC
  Filled 2019-11-20: qty 5

## 2019-11-20 MED ORDER — HEPARIN SOD (PORK) LOCK FLUSH 100 UNIT/ML IV SOLN
500.0000 [IU] | Freq: Once | INTRAVENOUS | Status: AC
  Administered 2019-11-20: 500 [IU] via INTRAVENOUS
  Filled 2019-11-20: qty 5

## 2019-11-20 MED ORDER — SODIUM CHLORIDE 0.9% FLUSH
10.0000 mL | Freq: Once | INTRAVENOUS | Status: AC
  Administered 2019-11-20: 10 mL via INTRAVENOUS
  Filled 2019-11-20: qty 10

## 2019-11-20 MED ORDER — SODIUM CHLORIDE 0.9 % IV SOLN
INTRAVENOUS | Status: DC
  Filled 2019-11-20 (×2): qty 250

## 2019-11-20 NOTE — Progress Notes (Signed)
Symptom Management Consult note [facility]  [phone] [phone]  Patient Care Team: Tonia Ghent, [name], [name]   Name of the patient: Jessica Barnes  [id number]  [birth year]   Date of visit: 11/20/2019   Diagnosis-breast cancer  Chief complaint/ Reason for visit- Follow-up from chemo   Heme/Onc history:  Oncology History Overview Note  # DEC 2020- LEFT BREAST-TWO PRIM ARIES:   #A]-IMC; ER/PR >90%; Her 2-IHC-2+; FISH-NEG-2 ; MRI- 3.7 CM; s/p s/p lumpectomy; s/p reexcision-final margins negative.  Stage Ib.    #B] Bx- IMC; G-1; ER/PR-pos; her2 NEG. s/p lumpectomy; margins negative [as per Dr.Wakefield; discussed at [facility] tumor conference]  # MARCH 31st 2021- START Taxotere [60 mg/m]; Cytoxan; growth factor  #Extreme anxiety/chronic diarrhea/chronic severe hypokalemia/history of migraines/depression  Used estrogen and progesterone therapy: on premarin for post- menopausal x 2-3 years- discontinued in JAN 2021.   # SURVIVORSHIP:   DIAGNOSIS: Breast cancer  STAGE: IB & I     ;  GOALS: Cure  CURRENT/MOST RECENT THERAPY : TC    Carcinoma of upper-outer quadrant of left breast in female, estrogen receptor positive ([facility])  08/14/2019 Initial Diagnosis   Carcinoma of upper-outer quadrant of left breast in female, estrogen receptor positive ([place])   09/10/2019 Genetic Testing   Negative genetic testing on the STAT and common hereditary cancer panel.  The Common Hereditary Gene Panel offered by [facility] includes sequencing and/or deletion duplication testing of the following 48 genes: APC, ATM, AXIN2, BARD1, BMPR1A, BRCA1, BRCA2, BRIP1, CDH1, CDK4, CDKN2A (p14ARF), CDKN2A (p16INK4a), CHEK2, CTNNA1, DICER1, EPCAM (Deletion/duplication testing only), GREM1 (promoter region deletion/duplication testing only), KIT, MEN1, MLH1, MSH2, MSH3, MSH6, MUTYH, NBN, NF1,  NHTL1, PALB2, PDGFRA, PMS2, POLD1, POLE, PTEN, RAD50, RAD51C, RAD51D, RNF43, SDHB, SDHC, SDHD, SMAD4, SMARCA4. STK11, TP53, TSC1, TSC2, and VHL.  The following genes were evaluated for sequence changes only: SDHA and HOXB13 c.251G>A variant only. The report date is September 10, 2019.   10/03/2019 Cancer Staging   Staging form: Breast, AJCC 8th Edition - Pathologic stage from 10/03/2019: Stage IB (pT2, pN1a, cM0, G2, ER+, PR+, HER2-) - Signed by Gardenia Phlegm, NP on 10/16/2019   10/09/2019 - 10/09/2019 Chemotherapy   The patient had DOXOrubicin (ADRIAMYCIN) chemo injection 98 mg, 60 mg/m2, Intravenous,  Once, 0 of 4 cycles PALONOSETRON HCL INJECTION 0.25 MG/5ML, 0.25 mg, Intravenous,  Once, 0 of 4 cycles pegfilgrastim-jmdb (FULPHILA) injection 6 mg, 6 mg, Subcutaneous,  Once, 0 of 4 cycles cyclophosphamide (CYTOXAN) 980 mg in sodium chloride 0.9 % 250 mL chemo infusion, 600 mg/m2, Intravenous,  Once, 0 of 4 cycles PACLitaxel (TAXOL) 132 mg in sodium chloride 0.9 % 250 mL chemo infusion (</= 86m/m2), 80 mg/m2, Intravenous,  Once, 0 of 12 cycles FOSAPREPITANT IV INFUSION 150 MG, 150 mg, Intravenous,  Once, 0 of 4 cycles  for chemotherapy treatment.    11/13/2019 -  Chemotherapy   The patient had palonosetron (ALOXI) injection 0.25 mg, 0.25 mg, Intravenous,  Once, 1 of 4 cycles Administration: 0.25 mg (11/13/2019) pegfilgrastim-jmdb (FULPHILA) injection 6 mg, 6 mg, Subcutaneous,  Once, 1 of 4 cycles Administration: 6 mg (11/14/2019) cyclophosphamide (CYTOXAN) 1,000 mg in sodium chloride 0.9 % 250 mL chemo infusion, 980 mg, Intravenous,  Once, 1 of 4 cycles Administration: 1,000 mg (11/13/2019) DOCEtaxel (TAXOTERE) 100 mg in sodium chloride 0.9 % 250 mL chemo infusion, 60 mg/m2 = 100 mg (100 % of original  dose 60 mg/m2), Intravenous,  Once, 1 of 4 cycles Dose modification: 60 mg/m2 (original dose 60 mg/m2, Cycle 1, Reason: Provider Judgment) Administration: 100 mg (11/13/2019)  for chemotherapy  treatment.     Interval history-patient presents to symptom management with above history for complaints of fatigue and body aches.  Patient recently completed her first cycle of Cytoxan/Taxotere with Neulasta support on 11/13/2019.   Patient states she has been doing fairly well up until this morning.  When she woke up she felt very dizzy when standing and thought she was going to vomit.  She immediately sat down and asked her husband to call clinic to be evaluated.  She denies any fevers or recent illness.  She admits to feeling pretty good up until today.  Her appetite has been stable and she denies any weight loss.  She denies any chest pain or shortness of breath.  She has had some diarrhea but has chronic IBS so this is not uncommon.  She denies any dysuria or hematuria.  ECOG FS:1 - Symptomatic but completely ambulatory  Review of systems- Review of Systems  Constitutional: Positive for malaise/fatigue. Negative for chills, fever and weight loss.  HENT: Negative for congestion, ear pain and tinnitus.   Eyes: Negative.  Negative for blurred vision and double vision.  Respiratory: Negative.  Negative for cough, sputum production and shortness of breath.   Cardiovascular: Negative.  Negative for chest pain, palpitations and leg swelling.  Gastrointestinal: Negative.  Negative for abdominal pain, constipation, diarrhea, nausea and vomiting.  Genitourinary: Negative for dysuria, frequency and urgency.  Musculoskeletal: Negative for back pain and falls.  Skin: Negative.  Negative for rash.  Neurological: Positive for dizziness and weakness. Negative for headaches.  Endo/Heme/Allergies: Negative.  Does not bruise/bleed easily.  Psychiatric/Behavioral: Negative.  Negative for depression. The patient is not nervous/anxious and does not have insomnia.      Current treatment-TC with Neulasta  Allergies  Allergen Reactions  . Sulfamethoxazole-Trimethoprim     Lip and tongue edema  . Codeine       GI upset     Past Medical History:  Diagnosis Date  . Anxiety 10/13/96  . Chest pain    atypical, hosp for that and depression 8/10-8/11/08  . COPD (chronic obstructive pulmonary disease) ([place]) 09/15/05   by x-ray  . Depression 10/13/96   hospital 8/10-8/11/08.  notable history: son with TBI after MVA  . Family history of breast cancer   . Family history of colon cancer   . Family history of pancreatic cancer   . Hyperlipidemia 09/15/98  . Hypothyroidism 01/14/96  . Migraine    27 yoa  . NSVD (normal spontaneous vaginal delivery)    x 1  . PONV (postoperative nausea and vomiting)   . PTSD (post-traumatic stress disorder)    likely PTSD after son's MVA/TBI in 2007, s/p counseling     Past Surgical History:  Procedure Laterality Date  . APPENDECTOMY     59 years of age  . BREAST BIOPSY Left 08/06/2019   [place] bx/ ribbon clip/ path pending  . BREAST BIOPSY Left 09/06/2019   [place] path pending heart clip  . BREAST LUMPECTOMY WITH RADIOACTIVE SEED AND SENTINEL LYMPH NODE BIOPSY Left 10/01/2019   Procedure: LEFT BREAST LUMPECTOMY WITH BRACKETED RADIOACTIVE SEEDS AND LEFT AXILLARY SENTINEL LYMPH NODE BIOPSY;  Surgeon: Rolm Bookbinder, MD;  Location: [facility];  Service: General;  Laterality: Left;  . PORTACATH PLACEMENT Right 10/28/2019   Procedure: INSERTION PORT-A-CATH WITH  ULTRASOUND GUIDANCE;  Surgeon: Rolm Bookbinder, MD;  Location: [facility];  Service: General;  Laterality: Right;  . RE-EXCISION OF BREAST LUMPECTOMY Left 10/28/2019   Procedure: RE-EXCISION OF LEFT BREAST LUMPECTOMY;  Surgeon: Rolm Bookbinder, MD;  Location: [facility];  Service: General;  Laterality: Left;    Social History   Socioeconomic History  . Marital status: Married    Spouse name: Not on file  . Number of children: Not on file  . Years of education: Not on file  . Highest education level: Not on file  Occupational History  . Not on file  Tobacco  Use  . Smoking status: Former Smoker    Packs/day: 0.50    Years: 8.00    Pack years: 4.00    Types: Cigarettes  . Smokeless tobacco: Never Used  . Tobacco comment: quit 04/15/2014  Substance and Sexual Activity  . Alcohol use: No    Alcohol/week: 0.0 standard drinks  . Drug use: No  . Sexual activity: Not on file  Other Topics Concern  . Not on file  Social History Narrative   Remarried, lives with husband 1 son, he had TBI and short term memory loss after prolonged illness      #Patient lives in [place] with her husband; prior history of smoking/currently vapes.  No significant alcohol use.  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    Social Determinants of Health   Financial Resource Strain:   . Difficulty of Paying Living Expenses:   Food Insecurity:   . Worried About Charity fundraiser in the Last Year:   . Arboriculturist in the Last Year:   Transportation Needs:   . Film/video editor (Medical):   Marland Kitchen Lack of Transportation (Non-Medical):   Physical Activity:   . Days of Exercise per Week:   . Minutes of Exercise per Session:   Stress:   . Feeling of Stress :   Social Connections:   . Frequency of Communication with Friends and Family:   . Frequency of Social Gatherings with Friends and Family:   . Attends Religious Services:   . Active Member of Clubs or Organizations:   . Attends Archivist Meetings:   Marland Kitchen Marital Status:   Intimate Partner Violence:   . Fear of Current or Ex-Partner:   . Emotionally Abused:   Marland Kitchen Physically Abused:   . Sexually Abused:     Family History  Problem Relation Age of Onset  . Hypertension Mother   . Colon cancer Father        d. 45  . Thyroid disease Sister   . Breast cancer Paternal Aunt   . Breast cancer Paternal Grandmother   . Colon cancer Paternal Grandmother   . Breast cancer Cousin        paternal aunt's daughter  . Colon cancer Paternal Uncle        3 pat uncles with colon cancer at unknown ages  .  Pancreatic cancer Maternal Uncle        d. 73  . Breast cancer Paternal Aunt      Current Outpatient Medications:  .  acetaminophen (TYLENOL) 500 MG tablet, Take 1,000 mg by mouth every 6 (six) hours as needed for mild pain or headache., Disp: , Rfl:  .  butalbital-acetaminophen-caffeine (FIORICET) 50-325-40 MG tablet, TAKE 1 TABLET BY MOUTH EVERY 4 TO 6 HOURS AS NEEDED FOR HEADACHE, Disp: 90 tablet, Rfl: 3 .  diazepam (VALIUM) 10  MG tablet, TAKE 1 TABLET BY MOUTH AT BEDTIME AS NEEDED FOR SLEEP, Disp: 30 tablet, Rfl: 1 .  dimenhyDRINATE (DRAMAMINE) 50 MG tablet, Take 50 mg by mouth every 8 (eight) hours as needed., Disp: , Rfl:  .  FLUoxetine (PROZAC) 20 MG capsule, TAKE 3 CAPSULES BY MOUTH DAILY, Disp: 270 capsule, Rfl: 1 .  FLUoxetine (PROZAC) 20 MG tablet, Take 60 mg by mouth daily., Disp: , Rfl:  .  ibuprofen (ADVIL) 100 MG tablet, Take 100 mg by mouth every 6 (six) hours as needed for fever., Disp: , Rfl:  .  levothyroxine (SYNTHROID) 88 MCG tablet, TAKE 1 TABLET BY MOUTH DAILY EXCEPT ON SUNDAYS TAKE 1/2 TABLET, Disp: 90 tablet, Rfl: 1 .  lidocaine-prilocaine (EMLA) cream, Apply 1 application topically as needed., Disp: 30 g, Rfl: 0 .  loperamide (IMODIUM A-D) 2 MG tablet, Take 4 mg by mouth 4 (four) times daily as needed for diarrhea or loose stools., Disp: , Rfl:  .  ondansetron (ZOFRAN) 8 MG tablet, One pill every 8 hours as needed for nausea/vomitting., Disp: 40 tablet, Rfl: 1 .  potassium chloride SA (KLOR-CON) 20 MEQ tablet, 1 pill twice a day, Disp: 60 tablet, Rfl: 3 .  prochlorperazine (COMPAZINE) 10 MG tablet, Take 1 tablet (10 mg total) by mouth every 6 (six) hours as needed for nausea or vomiting., Disp: 40 tablet, Rfl: 1 .  dexamethasone (DECADRON) 4 MG tablet, 1 tablet AM & PM; Take Day prior and day after chemo. (Patient not taking: Reported on 11/20/2019), Disp: 30 tablet, Rfl: 0 .  oxyCODONE (OXY IR/ROXICODONE) 5 MG immediate release tablet, Take 1 tablet (5 mg total) by mouth  every 6 (six) hours as needed for moderate pain, severe pain or breakthrough pain. (Patient not taking: Reported on 11/20/2019), Disp: 10 tablet, Rfl: 0 No current facility-administered medications for this visit.  Facility-Administered Medications Ordered in Other Visits:  .  0.9 %  sodium chloride infusion, , Intravenous, Continuous, Caz Weaver, Wandra Feinstein, NP, Stopped at 11/20/19 1010 .  heparin lock flush 100 unit/mL, 500 Units, Intracatheter, Once, Jacquelin Hawking, NP  Physical exam:  Vitals:   11/20/19 0822  BP: 94/66  Pulse: 98  Resp: 16  Temp: 99.5 F (37.5 C)  TempSrc: Oral   Physical Exam Constitutional:      Appearance: Normal appearance.  HENT:     Head: Normocephalic and atraumatic.  Eyes:     Pupils: Pupils are equal, round, and reactive to light.  Cardiovascular:     Rate and Rhythm: Normal rate and regular rhythm.     Heart sounds: Normal heart sounds. No murmur.  Pulmonary:     Effort: Pulmonary effort is normal.     Breath sounds: Normal breath sounds. No wheezing.  Abdominal:     General: Bowel sounds are normal. There is no distension.     Palpations: Abdomen is soft.     Tenderness: There is no abdominal tenderness.  Musculoskeletal:        General: Normal range of motion.     Cervical back: Normal range of motion.  Skin:    General: Skin is warm and dry.     Coloration: Skin is pale.     Findings: No rash.  Neurological:     Mental Status: She is alert and oriented to person, place, and time.  Psychiatric:        Judgment: Judgment normal.      CMP Latest Ref Rng & Units 11/20/2019  Glucose 70 -  99 mg/dL 158(H)  BUN 6 - 20 mg/dL 14  Creatinine 0.44 - 1.00 mg/dL 1.03(H)  Sodium 135 - 145 mmol/L 135  Potassium 3.5 - 5.1 mmol/L 3.5  Chloride 98 - 111 mmol/L 104  CO2 22 - 32 mmol/L 19(L)  Calcium 8.9 - 10.3 mg/dL 8.9  Total Protein 6.5 - 8.1 g/dL -  Total Bilirubin 0.3 - 1.2 mg/dL -  Alkaline Phos 38 - 126 U/L -  AST 15 - 41 U/L -  ALT 0 - 44  U/L -   CBC Latest Ref Rng & Units 11/20/2019  WBC 4.0 - 10.5 K/uL 4.5  Hemoglobin 12.0 - 15.0 g/dL 12.2  Hematocrit 36.0 - 46.0 % 38.2  Platelets 150 - 400 K/uL 285    No images are attached to the encounter.  DG CHEST PORT 1 VIEW  Result Date: 10/28/2019 CLINICAL DATA:  Port-A-Cath placement EXAM: PORTABLE CHEST 1 VIEW COMPARISON:  March 31, 2010. FINDINGS: Note that the lung bases are not completely visualized on this study. Port-A-Cath tip is in the superior vena cava. No pneumothorax. There is apparent soft tissue opacity overlying the left lower lung region. Visualized lungs elsewhere clear. Heart size and pulmonary vascularity normal. No adenopathy. No bone lesions. Postoperative change noted left hemithorax. IMPRESSION: Incomplete visualization of the lung bases. Opacity at the left lower lung region likely represents overlying packing material. There is postoperative change in the left lower hemithorax. Lungs elsewhere clear. Cardiac silhouette normal. Port-A-Cath tip in superior vena cava.  No pneumothorax. Electronically Signed   By: Lowella Grip III M.D.   On: 10/28/2019 12:45   DG Fluoro Guide CV Line-No Report  Result Date: 10/28/2019 Fluoroscopy was utilized by the requesting physician.  No radiographic interpretation.   Assessment and plan- Patient is a 59 y.o. female who presents with above history for complaints of weakness, fatigue and dizziness.  Dizziness and weakness likely due to dehydration from recent treatment.  Will check labs to evaluate electrolytes and blood counts.  We will also check vital signs for orthostasis.  Given she is having diarrhea, will collect a stool sample.  Unable to collect in clinic-patient to return once collected at home.  After IV fluids, IV Decadron and IV Zofran she noted imporvement.  Vital signs returned to normal.  She was given supplements to take home to help stay hydrated.  After future treatments, patient would likely benefit  from having a scheduled session for IV fluids, IV steroids and IV antiemetics.  Jenny Reichmann knows she can call clinic with any concerns or if she feels she needs additional IV fluids between treatments.  Plan: Labs- elevated creatinine (1.03)  Collect stool sample. Unable to produce a sample while in clinic.  Mild hypotension-94/66-improved with IV fluids 1 L NaCl. 10 mg Decadron 8 mg Zofran  Disposition: RTC on 12/04/19 for labs and Md assessment prior to cycle 2 TC.   Visit Diagnosis 1. Diarrhea, unspecified type   2. Dehydration     Patient expressed understanding and was in agreement with this plan. She also understands that She can call clinic at any time with any questions, concerns, or complaints.   Greater than 50% was spent in counseling and coordination of care with this patient including but not limited to discussion of the relevant topics above (See A&P) including, but not limited to diagnosis and management of acute and chronic medical conditions.   Thank you for allowing me to participate in the care of this very pleasant patient.  Jacquelin Hawking, NP [facility] [facility] Cell - [phone] Pager- [phone] 11/25/2019 11:03 AM

## 2019-11-21 ENCOUNTER — Telehealth: Payer: Self-pay | Admitting: *Deleted

## 2019-11-21 NOTE — Telephone Encounter (Signed)
Contacted patient. Spoke with husband Waunita Schooner. He stated that his wife is doing much better today. She has tolerated mash potatoes and cornbread. No episodes of nausea/vomiting. Pt is still c/o fatigue but improved. Husband thanked me for calling them to follow-up on yesterday's apt.

## 2019-11-27 NOTE — Progress Notes (Signed)

## 2019-12-02 ENCOUNTER — Other Ambulatory Visit: Payer: Self-pay | Admitting: Family Medicine

## 2019-12-02 NOTE — Telephone Encounter (Signed)
Electronic refill request. Diazepam Last office visit:   07/29/2019 Last Filled:     30 tablet 1 10/08/2019  Please advise.

## 2019-12-03 ENCOUNTER — Encounter: Payer: Self-pay | Admitting: Internal Medicine

## 2019-12-03 ENCOUNTER — Other Ambulatory Visit: Payer: Self-pay

## 2019-12-03 NOTE — Telephone Encounter (Signed)
Sent. Thanks.   

## 2019-12-04 ENCOUNTER — Encounter (INDEPENDENT_AMBULATORY_CARE_PROVIDER_SITE_OTHER): Payer: Self-pay

## 2019-12-04 ENCOUNTER — Inpatient Hospital Stay (HOSPITAL_BASED_OUTPATIENT_CLINIC_OR_DEPARTMENT_OTHER): Payer: No Typology Code available for payment source | Admitting: Internal Medicine

## 2019-12-04 ENCOUNTER — Inpatient Hospital Stay: Payer: No Typology Code available for payment source

## 2019-12-04 DIAGNOSIS — C50412 Malignant neoplasm of upper-outer quadrant of left female breast: Secondary | ICD-10-CM

## 2019-12-04 DIAGNOSIS — Z170 Estrogen receptor positive status [ER+]: Secondary | ICD-10-CM

## 2019-12-04 LAB — CBC WITH DIFFERENTIAL/PLATELET
Abs Immature Granulocytes: 0.08 10*3/uL — ABNORMAL HIGH (ref 0.00–0.07)
Basophils Absolute: 0.1 10*3/uL (ref 0.0–0.1)
Basophils Relative: 0 %
Eosinophils Absolute: 0 10*3/uL (ref 0.0–0.5)
Eosinophils Relative: 0 %
HCT: 34.4 % — ABNORMAL LOW (ref 36.0–46.0)
Hemoglobin: 11.1 g/dL — ABNORMAL LOW (ref 12.0–15.0)
Immature Granulocytes: 1 %
Lymphocytes Relative: 9 %
Lymphs Abs: 1.2 10*3/uL (ref 0.7–4.0)
MCH: 30 pg (ref 26.0–34.0)
MCHC: 32.3 g/dL (ref 30.0–36.0)
MCV: 93 fL (ref 80.0–100.0)
Monocytes Absolute: 0.4 10*3/uL (ref 0.1–1.0)
Monocytes Relative: 3 %
Neutro Abs: 11.7 10*3/uL — ABNORMAL HIGH (ref 1.7–7.7)
Neutrophils Relative %: 87 %
Platelets: 718 10*3/uL — ABNORMAL HIGH (ref 150–400)
RBC: 3.7 MIL/uL — ABNORMAL LOW (ref 3.87–5.11)
RDW: 13.9 % (ref 11.5–15.5)
WBC: 13.5 10*3/uL — ABNORMAL HIGH (ref 4.0–10.5)
nRBC: 0 % (ref 0.0–0.2)

## 2019-12-04 LAB — COMPREHENSIVE METABOLIC PANEL
ALT: 50 U/L — ABNORMAL HIGH (ref 0–44)
AST: 30 U/L (ref 15–41)
Albumin: 4.2 g/dL (ref 3.5–5.0)
Alkaline Phosphatase: 177 U/L — ABNORMAL HIGH (ref 38–126)
Anion gap: 9 (ref 5–15)
BUN: 15 mg/dL (ref 6–20)
CO2: 21 mmol/L — ABNORMAL LOW (ref 22–32)
Calcium: 9.2 mg/dL (ref 8.9–10.3)
Chloride: 107 mmol/L (ref 98–111)
Creatinine, Ser: 0.73 mg/dL (ref 0.44–1.00)
GFR calc Af Amer: >60 mL/min (ref >60)
GFR calc non Af Amer: >60 mL/min (ref >60)
Glucose, Bld: 169 mg/dL — ABNORMAL HIGH (ref 70–99)
Potassium: 4.3 mmol/L (ref 3.5–5.1)
Sodium: 137 mmol/L (ref 135–145)
Total Bilirubin: 0.4 mg/dL (ref 0.3–1.2)
Total Protein: 7.7 g/dL (ref 6.5–8.1)

## 2019-12-04 MED ORDER — PALONOSETRON HCL INJECTION 0.25 MG/5ML
0.2500 mg | Freq: Once | INTRAVENOUS | Status: AC
  Administered 2019-12-04: 0.25 mg via INTRAVENOUS
  Filled 2019-12-04: qty 5

## 2019-12-04 MED ORDER — SODIUM CHLORIDE 0.9 % IV SOLN
Freq: Once | INTRAVENOUS | Status: AC
  Filled 2019-12-04: qty 250

## 2019-12-04 MED ORDER — HEPARIN SOD (PORK) LOCK FLUSH 100 UNIT/ML IV SOLN
500.0000 [IU] | Freq: Once | INTRAVENOUS | Status: DC | PRN
  Filled 2019-12-04: qty 5

## 2019-12-04 MED ORDER — SODIUM CHLORIDE 0.9% FLUSH
10.0000 mL | INTRAVENOUS | Status: DC | PRN
  Administered 2019-12-04: 10 mL via INTRAVENOUS
  Filled 2019-12-04: qty 10

## 2019-12-04 MED ORDER — SODIUM CHLORIDE 0.9 % IV SOLN
10.0000 mg | Freq: Once | INTRAVENOUS | Status: AC
  Administered 2019-12-04: 10 mg via INTRAVENOUS
  Filled 2019-12-04: qty 10

## 2019-12-04 MED ORDER — DIPHENOXYLATE-ATROPINE 2.5-0.025 MG PO TABS: 1.0000 | ORAL_TABLET | Freq: Four times a day (QID) | ORAL | 0 refills | Status: DC | PRN

## 2019-12-04 MED ORDER — SODIUM CHLORIDE 0.9 % IV SOLN
60.0000 mg/m2 | Freq: Once | INTRAVENOUS | Status: AC
  Administered 2019-12-04: 100 mg via INTRAVENOUS
  Filled 2019-12-04: qty 10

## 2019-12-04 MED ORDER — LORAZEPAM 0.5 MG PO TABS
1.0000 mg | ORAL_TABLET | Freq: Once | ORAL | Status: AC
  Administered 2019-12-04: 1 mg via ORAL
  Filled 2019-12-04: qty 2

## 2019-12-04 MED ORDER — HEPARIN SOD (PORK) LOCK FLUSH 100 UNIT/ML IV SOLN
INTRAVENOUS | Status: AC
  Filled 2019-12-04: qty 5

## 2019-12-04 MED ORDER — HEPARIN SOD (PORK) LOCK FLUSH 100 UNIT/ML IV SOLN
500.0000 [IU] | Freq: Once | INTRAVENOUS | Status: AC
  Administered 2019-12-04: 500 [IU] via INTRAVENOUS
  Filled 2019-12-04: qty 5

## 2019-12-04 MED ORDER — SODIUM CHLORIDE 0.9 % IV SOLN
617.0000 mg/m2 | Freq: Once | INTRAVENOUS | Status: AC
  Administered 2019-12-04: 1000 mg via INTRAVENOUS
  Filled 2019-12-04: qty 50

## 2019-12-04 NOTE — Assessment & Plan Note (Addendum)
#  Left breast cancer-2 separate primaries- A] T2N1-ER/PR positive HER-2 negative;  B] T1N1 ER/PR positive HER-2 negative. On adjuvant chemotherapy Taxotere [60 mg/m] Cytoxan q. every 3 weeks.  # proceed with cycle #2. Labs today reviewed;  acceptable for treatment today-slightly elevated LFTs [see below].   # Diarrhea- G-2; sec to Taxotere; add lomotil.     #Chronic hypokalemia/diarrhea-potassium 4.2 today.  Continued potassium supplementation;   #Anxiety-recommend adding Ativan prior to chemotherapy.  Stable  #Elevated LFTs- alkaline phosphatase-170-stable.   # DISPOSITION:  # Chemo today; # D-2 fulphila # Follow up in 4/27 week- [facility]- labs- cbc/bmp; possible IVfs over 1 hour # in 3 week- MD; labs- cbc/cmp; Cytoxan-Taxotere; D-2 fulphila- Dr.B

## 2019-12-04 NOTE — Progress Notes (Signed)
[facility] [place] NOTE  Patient Care Team: Tonia Ghent, [name], [name]  #  Oncology History Overview Note  # DEC 2020- LEFT BREAST-TWO PRIM ARIES:   #A]-IMC; ER/PR >90%; Her 2-IHC-2+; FISH-NEG-2 ; MRI- 3.7 CM; s/p s/p lumpectomy; s/p reexcision-final margins negative.  Stage Ib.    #B] Bx- IMC; G-1; ER/PR-pos; her2 NEG. s/p lumpectomy; margins negative [as per Dr.Wakefield; discussed at [facility] tumor conference]  # MARCH 31st 2021- START Taxotere [60 mg/m]; Cytoxan; growth factor  #Extreme anxiety/chronic diarrhea/chronic severe hypokalemia/history of migraines/depression  Used estrogen and progesterone therapy: on premarin for post- menopausal x 2-3 years- discontinued in JAN 2021.   # SURVIVORSHIP:   DIAGNOSIS: Breast cancer  STAGE: IB & I     ;  GOALS: Cure  CURRENT/MOST RECENT THERAPY : TC    Carcinoma of upper-outer quadrant of left breast in female, estrogen receptor positive ([place])  08/14/2019 Initial Diagnosis   Carcinoma of upper-outer quadrant of left breast in female, estrogen receptor positive ([place])   09/10/2019 Genetic Testing   Negative genetic testing on the STAT and common hereditary cancer panel.  The Common Hereditary Gene Panel offered by [facility] includes sequencing and/or deletion duplication testing of the following 48 genes: APC, ATM, AXIN2, BARD1, BMPR1A, BRCA1, BRCA2, BRIP1, CDH1, CDK4, CDKN2A (p14ARF), CDKN2A (p16INK4a), CHEK2, CTNNA1, DICER1, EPCAM (Deletion/duplication testing only), GREM1 (promoter region deletion/duplication testing only), KIT, MEN1, MLH1, MSH2, MSH3, MSH6, MUTYH, NBN, NF1, NHTL1, PALB2, PDGFRA, PMS2, POLD1, POLE, PTEN, RAD50, RAD51C, RAD51D, RNF43, SDHB, SDHC, SDHD, SMAD4, SMARCA4. STK11, TP53, TSC1, TSC2, and VHL.  The following genes were evaluated for sequence changes only: SDHA and HOXB13  c.251G>A variant only. The report date is September 10, 2019.   10/03/2019 Cancer Staging   Staging form: Breast, AJCC 8th Edition - Pathologic stage from 10/03/2019: Stage IB (pT2, pN1a, cM0, G2, ER+, PR+, HER2-) - Signed by Gardenia Phlegm, NP on 10/16/2019   10/09/2019 - 10/09/2019 Chemotherapy   The patient had DOXOrubicin (ADRIAMYCIN) chemo injection 98 mg, 60 mg/m2, Intravenous,  Once, 0 of 4 cycles PALONOSETRON HCL INJECTION 0.25 MG/5ML, 0.25 mg, Intravenous,  Once, 0 of 4 cycles pegfilgrastim-jmdb (FULPHILA) injection 6 mg, 6 mg, Subcutaneous,  Once, 0 of 4 cycles cyclophosphamide (CYTOXAN) 980 mg in sodium chloride 0.9 % 250 mL chemo infusion, 600 mg/m2, Intravenous,  Once, 0 of 4 cycles PACLitaxel (TAXOL) 132 mg in sodium chloride 0.9 % 250 mL chemo infusion (</= 75m/m2), 80 mg/m2, Intravenous,  Once, 0 of 12 cycles FOSAPREPITANT IV INFUSION 150 MG, 150 mg, Intravenous,  Once, 0 of 4 cycles  for chemotherapy treatment.    11/13/2019 -  Chemotherapy   The patient had palonosetron (ALOXI) injection 0.25 mg, 0.25 mg, Intravenous,  Once, 2 of 4 cycles Administration: 0.25 mg (11/13/2019) pegfilgrastim-jmdb (FULPHILA) injection 6 mg, 6 mg, Subcutaneous,  Once, 2 of 4 cycles Administration: 6 mg (11/14/2019) cyclophosphamide (CYTOXAN) 1,000 mg in sodium chloride 0.9 % 250 mL chemo infusion, 980 mg, Intravenous,  Once, 2 of 4 cycles Administration: 1,000 mg (11/13/2019) DOCEtaxel (TAXOTERE) 100 mg in sodium chloride 0.9 % 250 mL chemo infusion, 60 mg/m2 = 100 mg (100 % of original dose 60 mg/m2), Intravenous,  Once, 2 of 4 cycles Dose modification: 60 mg/m2 (original dose 60 mg/m2, Cycle 1, Reason: Provider Judgment) Administration: 100 mg (11/13/2019)  for chemotherapy treatment.  HISTORY OF PRESENTING ILLNESS:  Jessica Barnes 59 y.o.  female with of breast cancer  [T2N1-ER/PR positive HER-2 negative; T1N1-ER/PR positive HER-2 negative 2 separate primaries] on Cytoxan  Taxotere is here for follow-up.  Full cycle #1 patient was seen in the symptomatic med clinic for-severe diarrhea/nausea.  Patient was treated with IV fluids/nausea medications.   Patient complains of hair loss.  Is currently constipated.  In general patient is chronic mild diarrhea.  Mild abdominal bloating.  No significant swelling of the legs.  No skin rash.  Review of Systems  Constitutional: Negative for chills, diaphoresis, fever, malaise/fatigue and weight loss.  HENT: Negative for nosebleeds and sore throat.   Eyes: Negative for double vision.  Respiratory: Negative for cough, hemoptysis, sputum production, shortness of breath and wheezing.   Cardiovascular: Negative for chest pain, palpitations, orthopnea and leg swelling.  Gastrointestinal: Negative for abdominal pain, blood in stool, constipation, diarrhea, heartburn, melena, nausea and vomiting.  Genitourinary: Negative for dysuria, frequency and urgency.  Musculoskeletal: Negative for back pain and joint pain.  Skin: Negative.  Negative for itching and rash.  Neurological: Positive for headaches. Negative for dizziness, tingling, focal weakness and weakness.  Endo/Heme/Allergies: Does not bruise/bleed easily.  Psychiatric/Behavioral: Positive for depression. The patient is nervous/anxious. The patient does not have insomnia.      MEDICAL HISTORY:  Past Medical History:  Diagnosis Date  . Anxiety 10/13/96  . Chest pain    atypical, hosp for that and depression 8/10-8/11/08  . COPD (chronic obstructive pulmonary disease) ([place]) 09/15/05   by x-ray  . Depression 10/13/96   hospital 8/10-8/11/08.  notable history: son with TBI after MVA  . Family history of breast cancer   . Family history of colon cancer   . Family history of pancreatic cancer   . Hyperlipidemia 09/15/98  . Hypothyroidism 01/14/96  . Migraine    27 yoa  . NSVD (normal spontaneous vaginal delivery)    x 1  . PONV (postoperative nausea and vomiting)   . PTSD  (post-traumatic stress disorder)    likely PTSD after son's MVA/TBI in 2007, s/p counseling    SURGICAL HISTORY: Past Surgical History:  Procedure Laterality Date  . APPENDECTOMY     59 years of age  . BREAST BIOPSY Left 08/06/2019   [place] bx/ ribbon clip/ path pending  . BREAST BIOPSY Left 09/06/2019   [place] path pending heart clip  . BREAST LUMPECTOMY WITH RADIOACTIVE SEED AND SENTINEL LYMPH NODE BIOPSY Left 10/01/2019   Procedure: LEFT BREAST LUMPECTOMY WITH BRACKETED RADIOACTIVE SEEDS AND LEFT AXILLARY SENTINEL LYMPH NODE BIOPSY;  Surgeon: Rolm Bookbinder, MD;  Location: [facility];  Service: General;  Laterality: Left;  . PORTACATH PLACEMENT Right 10/28/2019   Procedure: INSERTION PORT-A-CATH WITH ULTRASOUND GUIDANCE;  Surgeon: Rolm Bookbinder, MD;  Location: [facility];  Service: General;  Laterality: Right;  . RE-EXCISION OF BREAST LUMPECTOMY Left 10/28/2019   Procedure: RE-EXCISION OF LEFT BREAST LUMPECTOMY;  Surgeon: Rolm Bookbinder, MD;  Location: [facility];  Service: General;  Laterality: Left;    SOCIAL HISTORY: Social History   Socioeconomic History  . Marital status: Married    Spouse name: Not on file  . Number of children: Not on file  . Years of education: Not on file  . Highest education level: Not on file  Occupational History  . Not on file  Tobacco Use  . Smoking status: Former Smoker    Packs/day: 0.50    Years: 8.00  Pack years: 4.00    Types: Cigarettes  . Smokeless tobacco: Never Used  . Tobacco comment: quit 04/15/2014  Substance and Sexual Activity  . Alcohol use: No    Alcohol/week: 0.0 standard drinks  . Drug use: No  . Sexual activity: Not on file  Other Topics Concern  . Not on file  Social History Narrative   Remarried, lives with husband 1 son, he had TBI and short term memory loss after prolonged illness      #Patient lives in [place] with her husband; prior history of  smoking/currently vapes.  No significant alcohol use.  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    Social Determinants of Health   Financial Resource Strain:   . Difficulty of Paying Living Expenses:   Food Insecurity:   . Worried About Charity fundraiser in the Last Year:   . Arboriculturist in the Last Year:   Transportation Needs:   . Film/video editor (Medical):   Marland Kitchen Lack of Transportation (Non-Medical):   Physical Activity:   . Days of Exercise per Week:   . Minutes of Exercise per Session:   Stress:   . Feeling of Stress :   Social Connections:   . Frequency of Communication with Friends and Family:   . Frequency of Social Gatherings with Friends and Family:   . Attends Religious Services:   . Active Member of Clubs or Organizations:   . Attends Archivist Meetings:   Marland Kitchen Marital Status:   Intimate Partner Violence:   . Fear of Current or Ex-Partner:   . Emotionally Abused:   Marland Kitchen Physically Abused:   . Sexually Abused:     FAMILY HISTORY: Family History  Problem Relation Age of Onset  . Hypertension Mother   . Colon cancer Father        d. 54  . Thyroid disease Sister   . Breast cancer Paternal Aunt   . Breast cancer Paternal Grandmother   . Colon cancer Paternal Grandmother   . Breast cancer Cousin        paternal aunt's daughter  . Colon cancer Paternal Uncle        3 pat uncles with colon cancer at unknown ages  . Pancreatic cancer Maternal Uncle        d. 58  . Breast cancer Paternal Aunt     ALLERGIES:  is allergic to sulfamethoxazole-trimethoprim and codeine.  MEDICATIONS:  Current Outpatient Medications  Medication Sig Dispense Refill  . acetaminophen (TYLENOL) 500 MG tablet Take 1,000 mg by mouth every 6 (six) hours as needed for mild pain or headache.    . butalbital-acetaminophen-caffeine (FIORICET) 50-325-40 MG tablet TAKE 1 TABLET BY MOUTH EVERY 4 TO 6 HOURS AS NEEDED FOR HEADACHE 90 tablet 3  . dexamethasone (DECADRON) 4 MG tablet 1  tablet AM & PM; Take Day prior and day after chemo. 30 tablet 0  . diazepam (VALIUM) 10 MG tablet TAKE 1 TABLET BY MOUTH AT BEDTIME AS NEEDED FOR SLEEP 30 tablet 1  . dimenhyDRINATE (DRAMAMINE) 50 MG tablet Take 50 mg by mouth every 8 (eight) hours as needed.    Marland Kitchen FLUoxetine (PROZAC) 20 MG capsule TAKE 3 CAPSULES BY MOUTH DAILY 270 capsule 1  . FLUoxetine (PROZAC) 20 MG tablet Take 60 mg by mouth daily.    Marland Kitchen ibuprofen (ADVIL) 100 MG tablet Take 100 mg by mouth every 6 (six) hours as needed for fever.    . levothyroxine (SYNTHROID) 88  MCG tablet TAKE 1 TABLET BY MOUTH DAILY EXCEPT ON SUNDAYS TAKE 1/2 TABLET 90 tablet 1  . lidocaine-prilocaine (EMLA) cream Apply 1 application topically as needed. 30 g 0  . loperamide (IMODIUM A-D) 2 MG tablet Take 4 mg by mouth 4 (four) times daily as needed for diarrhea or loose stools.    . ondansetron (ZOFRAN) 8 MG tablet One pill every 8 hours as needed for nausea/vomitting. 40 tablet 1  . oxyCODONE (OXY IR/ROXICODONE) 5 MG immediate release tablet Take 1 tablet (5 mg total) by mouth every 6 (six) hours as needed for moderate pain, severe pain or breakthrough pain. 10 tablet 0  . potassium chloride SA (KLOR-CON) 20 MEQ tablet 1 pill twice a day 60 tablet 3  . prochlorperazine (COMPAZINE) 10 MG tablet Take 1 tablet (10 mg total) by mouth every 6 (six) hours as needed for nausea or vomiting. 40 tablet 1  . diphenoxylate-atropine (LOMOTIL) 2.5-0.025 MG tablet Take 1 tablet by mouth 4 (four) times daily as needed for diarrhea or loose stools. Take it along with immodium 30 tablet 0   No current facility-administered medications for this visit.   Facility-Administered Medications Ordered in Other Visits  Medication Dose Route Frequency Provider Last Rate Last Admin  . cyclophosphamide (CYTOXAN) 1,000 mg in sodium chloride 0.9 % 250 mL chemo infusion  617 mg/m2 (Treatment Plan Recorded) Intravenous Once Cammie Sickle, MD      . dexamethasone (DECADRON) 10 mg  in sodium chloride 0.9 % 50 mL IVPB  10 mg Intravenous Once Charlaine Dalton R, MD      . DOCEtaxel (TAXOTERE) 100 mg in sodium chloride 0.9 % 250 mL chemo infusion  60 mg/m2 (Treatment Plan Recorded) Intravenous Once Charlaine Dalton R, MD      . heparin lock flush 100 unit/mL  500 Units Intravenous Once Charlaine Dalton R, MD      . heparin lock flush 100 unit/mL  500 Units Intracatheter Once PRN Cammie Sickle, MD      . LORazepam (ATIVAN) tablet 1 mg  1 mg Oral Once Charlaine Dalton R, MD      . palonosetron (ALOXI) injection 0.25 mg  0.25 mg Intravenous Once Charlaine Dalton R, MD      . sodium chloride flush (NS) 0.9 % injection 10 mL  10 mL Intravenous PRN Cammie Sickle, MD   10 mL at 12/04/19 0848      .  PHYSICAL EXAMINATION: ECOG PERFORMANCE STATUS: 0 - Asymptomatic  Vitals:   12/04/19 0855  BP: 109/63  Pulse: 98  Resp: 20  Temp: 98.2 F (36.8 C)   Filed Weights   12/04/19 0855  Weight: 133 lb (60.3 kg)    Physical Exam  Constitutional: She is oriented to person, place, and time and well-developed, well-nourished, and in no distress.  Accompanied by husband.  HENT:  Head: Normocephalic and atraumatic.  Mouth/Throat: Oropharynx is clear and moist. No oropharyngeal exudate.  Eyes: Pupils are equal, round, and reactive to light.  Cardiovascular: Normal rate and regular rhythm.  Pulmonary/Chest: Effort normal and breath sounds normal. No respiratory distress. She has no wheezes.  Abdominal: Soft. Bowel sounds are normal. She exhibits no distension and no mass. There is no abdominal tenderness. There is no rebound and no guarding.  Musculoskeletal:        General: No tenderness or edema. Normal range of motion.     Cervical back: Normal range of motion and neck supple.  Neurological: She is alert and  oriented to person, place, and time.  Skin: Skin is warm.  Psychiatric: Affect normal.  Anxious   LABORATORY DATA:  I have reviewed the  data as listed Lab Results  Component Value Date   WBC 13.5 (H) 12/04/2019   HGB 11.1 (L) 12/04/2019   HCT 34.4 (L) 12/04/2019   MCV 93.0 12/04/2019   PLT 718 (H) 12/04/2019   Recent Labs    08/14/19 1544 09/06/19 1054 09/06/19 1054 10/10/19 1325 11/06/19 0950 11/06/19 0950 11/13/19 0831 11/20/19 0825 12/04/19 0837  NA   < >  --   --   --  138   < > 135 135 137  K   < >  --   --   --  2.8*   < > 4.0 3.5 4.3  CL   < >  --   --   --  105   < > 103 104 107  CO2   < >  --   --   --  24   < > 24 19* 21*  GLUCOSE   < >  --   --   --  115*   < > 95 158* 169*  BUN   < >  --   --   --  11   < > _0 CREATININE   < >  --   --   --  0.80   < > 0.76 1.03* 0.73  CALCIUM   < >  --    < > 8.6* 8.5*   < > 9.4 8.9 9.2  GFRNONAA   < >  --   --   --  >60   < > >60 59* >60  GFRAA   < >  --   --   --  >60   < > >60 >60 >60  PROT  --  7.6   < > 6.5 6.9  --  7.7  --  7.7  ALBUMIN  --  3.8   < > 4.0 3.7  --  4.1  --  4.2  AST   < > 27   < > 23 21  --  67*  --  30  ALT   < > 18   < > 21 20  --  75*  --  50*  ALKPHOS   < > 174*   < > 200* 194*  --  227*  --  177*  BILITOT  --  0.4   < > <0.2 0.3  --  0.4  --  0.4  BILIDIR  --  <0.1  --  0.07  --   --   --   --   --   IBILI  --  NOT CALCULATED  --   --   --   --   --   --   --    < > = values in this interval not displayed.    RADIOGRAPHIC STUDIES: I have personally reviewed the radiological images as listed and agreed with the findings in the report. No results found.  ASSESSMENT & PLAN:   Carcinoma of upper-outer quadrant of left breast in female, estrogen receptor positive ([place]) # Left breast cancer-2 separate primaries- A] T2N1-ER/PR positive HER-2 negative;  B] T1N1 ER/PR positive HER-2 negative. On adjuvant chemotherapy Taxotere [60 mg/m] Cytoxan q. every 3 weeks.  # proceed with cycle #2. Labs today reviewed;  acceptable for treatment today-slightly elevated LFTs [see below].   #  Diarrhea- G-2; sec to Taxotere; add lomotil.      #Chronic hypokalemia/diarrhea-potassium 4.2 today.  Continued potassium supplementation;   #Anxiety-recommend adding Ativan prior to chemotherapy.  Stable  #Elevated LFTs- alkaline phosphatase-170-stable.   # DISPOSITION:  # Chemo today; # D-2 fulphila # Follow up in 4/27 week- [facility]- labs- cbc/bmp; possible IVfs over 1 hour # in 3 week- MD; labs- cbc/cmp; Cytoxan-Taxotere; D-2 fulphila- Dr.B  All questions were answered. The patient/family knows to call the clinic with any problems, questions or concerns.    Cammie Sickle, MD 12/04/2019 10:03 AM

## 2019-12-05 ENCOUNTER — Inpatient Hospital Stay: Payer: No Typology Code available for payment source

## 2019-12-05 ENCOUNTER — Other Ambulatory Visit: Payer: Self-pay

## 2019-12-05 DIAGNOSIS — C50412 Malignant neoplasm of upper-outer quadrant of left female breast: Secondary | ICD-10-CM | POA: Diagnosis not present

## 2019-12-05 DIAGNOSIS — Z170 Estrogen receptor positive status [ER+]: Secondary | ICD-10-CM

## 2019-12-05 MED ORDER — PEGFILGRASTIM-JMDB 6 MG/0.6ML ~~LOC~~ SOSY
6.0000 mg | PREFILLED_SYRINGE | Freq: Once | SUBCUTANEOUS | Status: AC
  Administered 2019-12-05: 6 mg via SUBCUTANEOUS
  Filled 2019-12-05: qty 0.6

## 2019-12-11 ENCOUNTER — Encounter: Payer: Self-pay | Admitting: Oncology

## 2019-12-11 ENCOUNTER — Inpatient Hospital Stay: Payer: No Typology Code available for payment source

## 2019-12-11 ENCOUNTER — Inpatient Hospital Stay (HOSPITAL_BASED_OUTPATIENT_CLINIC_OR_DEPARTMENT_OTHER): Payer: No Typology Code available for payment source | Admitting: Oncology

## 2019-12-11 ENCOUNTER — Other Ambulatory Visit: Payer: Self-pay

## 2019-12-11 VITALS — BP 106/57 | HR 115 | Temp 98.8°F | Resp 20

## 2019-12-11 DIAGNOSIS — E860 Dehydration: Secondary | ICD-10-CM | POA: Diagnosis not present

## 2019-12-11 DIAGNOSIS — Z170 Estrogen receptor positive status [ER+]: Secondary | ICD-10-CM

## 2019-12-11 DIAGNOSIS — C50412 Malignant neoplasm of upper-outer quadrant of left female breast: Secondary | ICD-10-CM

## 2019-12-11 LAB — CBC WITH DIFFERENTIAL/PLATELET
Abs Immature Granulocytes: 0.5 10*3/uL — ABNORMAL HIGH (ref 0.00–0.07)
Band Neutrophils: 6 %
Basophils Absolute: 0.3 10*3/uL — ABNORMAL HIGH (ref 0.0–0.1)
Basophils Relative: 4 %
Eosinophils Absolute: 0.4 10*3/uL (ref 0.0–0.5)
Eosinophils Relative: 6 %
HCT: 31.3 % — ABNORMAL LOW (ref 36.0–46.0)
Hemoglobin: 10.1 g/dL — ABNORMAL LOW (ref 12.0–15.0)
Lymphocytes Relative: 37 %
Lymphs Abs: 2.4 10*3/uL (ref 0.7–4.0)
MCH: 30.1 pg (ref 26.0–34.0)
MCHC: 32.3 g/dL (ref 30.0–36.0)
MCV: 93.2 fL (ref 80.0–100.0)
Metamyelocytes Relative: 4 %
Monocytes Absolute: 1.6 10*3/uL — ABNORMAL HIGH (ref 0.1–1.0)
Monocytes Relative: 25 %
Myelocytes: 4 %
Neutro Abs: 1.3 10*3/uL — ABNORMAL LOW (ref 1.7–7.7)
Neutrophils Relative %: 14 %
Platelets: 400 10*3/uL (ref 150–400)
RBC: 3.36 MIL/uL — ABNORMAL LOW (ref 3.87–5.11)
RDW: 13.8 % (ref 11.5–15.5)
WBC: 6.5 10*3/uL (ref 4.0–10.5)
nRBC: 0.6 % — ABNORMAL HIGH (ref 0.0–0.2)

## 2019-12-11 LAB — BASIC METABOLIC PANEL
Anion gap: 10 (ref 5–15)
BUN: 8 mg/dL (ref 6–20)
CO2: 23 mmol/L (ref 22–32)
Calcium: 8.7 mg/dL — ABNORMAL LOW (ref 8.9–10.3)
Chloride: 105 mmol/L (ref 98–111)
Creatinine, Ser: 0.75 mg/dL (ref 0.44–1.00)
GFR calc Af Amer: >60 mL/min (ref >60)
GFR calc non Af Amer: >60 mL/min (ref >60)
Glucose, Bld: 107 mg/dL — ABNORMAL HIGH (ref 70–99)
Potassium: 3.7 mmol/L (ref 3.5–5.1)
Sodium: 138 mmol/L (ref 135–145)

## 2019-12-11 MED ORDER — SODIUM CHLORIDE 0.9% FLUSH
10.0000 mL | Freq: Once | INTRAVENOUS | Status: AC
  Administered 2019-12-11: 10 mL via INTRAVENOUS
  Filled 2019-12-11: qty 10

## 2019-12-11 MED ORDER — HEPARIN SOD (PORK) LOCK FLUSH 100 UNIT/ML IV SOLN
500.0000 [IU] | Freq: Once | INTRAVENOUS | Status: AC
  Administered 2019-12-11: 500 [IU] via INTRAVENOUS
  Filled 2019-12-11: qty 5

## 2019-12-11 NOTE — Progress Notes (Signed)
Symptom Management Consult note [facility]  [phone] [phone]  Patient Care Team: Tonia Ghent, [name], [name]   Name of the patient: Jessica Barnes  [id number]  03-30-1961   Date of visit: 12/11/2019   Diagnosis-breast cancer  Chief complaint/ Reason for visit-follow-up after chemotherapy  Heme/Onc history:  Oncology History Overview Note  # DEC 2020- LEFT BREAST-TWO PRIM ARIES:   #A]-IMC; ER/PR >90%; Her 2-IHC-2+; FISH-NEG-2 ; MRI- 3.7 CM; s/p s/p lumpectomy; s/p reexcision-final margins negative.  Stage Ib.    #B] Bx- IMC; G-1; ER/PR-pos; her2 NEG. s/p lumpectomy; margins negative [as per Dr.Wakefield; discussed at [facility] tumor conference]  # MARCH 31st 2021- START Taxotere [60 mg/m]; Cytoxan; growth factor  #Extreme anxiety/chronic diarrhea/chronic severe hypokalemia/history of migraines/depression  Used estrogen and progesterone therapy: on premarin for post- menopausal x 2-3 years- discontinued in JAN 2021.   # SURVIVORSHIP:   DIAGNOSIS: Breast cancer  STAGE: IB & I     ;  GOALS: Cure  CURRENT/MOST RECENT THERAPY : TC    Carcinoma of upper-outer quadrant of left breast in female, estrogen receptor positive ([place])  08/14/2019 Initial Diagnosis   Carcinoma of upper-outer quadrant of left breast in female, estrogen receptor positive ([place])   09/10/2019 Genetic Testing   Negative genetic testing on the STAT and common hereditary cancer panel.  The Common Hereditary Gene Panel offered by [facility] includes sequencing and/or deletion duplication testing of the following 48 genes: APC, ATM, AXIN2, BARD1, BMPR1A, BRCA1, BRCA2, BRIP1, CDH1, CDK4, CDKN2A (p14ARF), CDKN2A (p16INK4a), CHEK2, CTNNA1, DICER1, EPCAM (Deletion/duplication testing only), GREM1 (promoter region deletion/duplication testing only), KIT, MEN1, MLH1, MSH2, MSH3, MSH6, MUTYH, NBN,  NF1, NHTL1, PALB2, PDGFRA, PMS2, POLD1, POLE, PTEN, RAD50, RAD51C, RAD51D, RNF43, SDHB, SDHC, SDHD, SMAD4, SMARCA4. STK11, TP53, TSC1, TSC2, and VHL.  The following genes were evaluated for sequence changes only: SDHA and HOXB13 c.251G>A variant only. The report date is September 10, 2019.   10/03/2019 Cancer Staging   Staging form: Breast, AJCC 8th Edition - Pathologic stage from 10/03/2019: Stage IB (pT2, pN1a, cM0, G2, ER+, PR+, HER2-) - Signed by Gardenia Phlegm, NP on 10/16/2019   10/09/2019 - 10/09/2019 Chemotherapy   The patient had DOXOrubicin (ADRIAMYCIN) chemo injection 98 mg, 60 mg/m2, Intravenous,  Once, 0 of 4 cycles PALONOSETRON HCL INJECTION 0.25 MG/5ML, 0.25 mg, Intravenous,  Once, 0 of 4 cycles pegfilgrastim-jmdb (FULPHILA) injection 6 mg, 6 mg, Subcutaneous,  Once, 0 of 4 cycles cyclophosphamide (CYTOXAN) 980 mg in sodium chloride 0.9 % 250 mL chemo infusion, 600 mg/m2, Intravenous,  Once, 0 of 4 cycles PACLitaxel (TAXOL) 132 mg in sodium chloride 0.9 % 250 mL chemo infusion (</= 70m/m2), 80 mg/m2, Intravenous,  Once, 0 of 12 cycles FOSAPREPITANT IV INFUSION 150 MG, 150 mg, Intravenous,  Once, 0 of 4 cycles  for chemotherapy treatment.    11/13/2019 -  Chemotherapy   The patient had palonosetron (ALOXI) injection 0.25 mg, 0.25 mg, Intravenous,  Once, 2 of 4 cycles Administration: 0.25 mg (11/13/2019), 0.25 mg (12/04/2019) pegfilgrastim-jmdb (FULPHILA) injection 6 mg, 6 mg, Subcutaneous,  Once, 2 of 4 cycles Administration: 6 mg (11/14/2019), 6 mg (12/05/2019) cyclophosphamide (CYTOXAN) 1,000 mg in sodium chloride 0.9 % 250 mL chemo infusion, 980 mg, Intravenous,  Once, 2 of 4 cycles Administration: 1,000 mg (11/13/2019), 1,000 mg (12/04/2019) DOCEtaxel (TAXOTERE) 100 mg in sodium chloride 0.9 % 250 mL chemo infusion, 60 mg/m2 =  100 mg (100 % of original dose 60 mg/m2), Intravenous,  Once, 2 of 4 cycles Dose modification: 60 mg/m2 (original dose 60 mg/m2, Cycle 1, Reason: Provider  Judgment) Administration: 100 mg (11/13/2019), 100 mg (12/04/2019)  for chemotherapy treatment.     Interval history-patient presents to symptom management with above history for follow-up after cycle 2 of Cytoxan/Taxotere with Neulasta support on 12/04/2019.  She was seen in symptom management on 11/20/2019 for fatigue and body aches and required IV fluids, antiemetics and steroids.  Plan was for close follow-up after cycle 2.  Jessica Barnes presents today with her husband.  She states she feels great since cycle 2 given on 12/04/2019.  She has not experienced any of the side effects she had with cycle 1, specifically no dizziness, nausea or weakness. She admits to "some fatigue" but otherwise feels well.  She denies any fevers or illness, easy bleeding or bruising, reports a good appetite and denies weight loss.  She denies chest pain, nausea, vomiting, constipation or diarrhea.   ECOG FS:1 - Symptomatic but completely ambulatory  Review of systems- Review of Systems  Constitutional: Negative.  Negative for chills, fever, malaise/fatigue and weight loss.  HENT: Negative for congestion, ear pain and tinnitus.   Eyes: Negative.  Negative for blurred vision and double vision.  Respiratory: Negative.  Negative for cough, sputum production and shortness of breath.   Cardiovascular: Negative.  Negative for chest pain, palpitations and leg swelling.  Gastrointestinal: Negative.  Negative for abdominal pain, constipation, diarrhea, nausea and vomiting.  Genitourinary: Negative for dysuria, frequency and urgency.  Musculoskeletal: Negative for back pain and falls.  Skin: Negative.  Negative for rash.  Neurological: Negative.  Negative for weakness and headaches.  Endo/Heme/Allergies: Negative.  Does not bruise/bleed easily.  Psychiatric/Behavioral: Negative.  Negative for depression. The patient is not nervous/anxious and does not have insomnia.      Current treatment-cycle 2 TC with Neulasta-given last on  12/04/2019  Allergies  Allergen Reactions  . Sulfamethoxazole-Trimethoprim     Lip and tongue edema  . Codeine     GI upset     Past Medical History:  Diagnosis Date  . Anxiety 10/13/96  . Chest pain    atypical, hosp for that and depression 8/10-8/11/08  . COPD (chronic obstructive pulmonary disease) ([place]) 09/15/05   by x-ray  . Depression 10/13/96   hospital 8/10-8/11/08.  notable history: son with TBI after MVA  . Family history of breast cancer   . Family history of colon cancer   . Family history of pancreatic cancer   . Hyperlipidemia 09/15/98  . Hypothyroidism 01/14/96  . Migraine    27 yoa  . NSVD (normal spontaneous vaginal delivery)    x 1  . PONV (postoperative nausea and vomiting)   . PTSD (post-traumatic stress disorder)    likely PTSD after son's MVA/TBI in 2007, s/p counseling     Past Surgical History:  Procedure Laterality Date  . APPENDECTOMY     59 years of age  . BREAST BIOPSY Left 08/06/2019   [place] bx/ ribbon clip/ path pending  . BREAST BIOPSY Left 09/06/2019   [place] path pending heart clip  . BREAST LUMPECTOMY WITH RADIOACTIVE SEED AND SENTINEL LYMPH NODE BIOPSY Left 10/01/2019   Procedure: LEFT BREAST LUMPECTOMY WITH BRACKETED RADIOACTIVE SEEDS AND LEFT AXILLARY SENTINEL LYMPH NODE BIOPSY;  Surgeon: Rolm Bookbinder, MD;  Location: [place];  Service: General;  Laterality: Left;  . PORTACATH PLACEMENT Right 10/28/2019   Procedure:  INSERTION PORT-A-CATH WITH ULTRASOUND GUIDANCE;  Surgeon: Rolm Bookbinder, MD;  Location: [place];  Service: General;  Laterality: Right;  . RE-EXCISION OF BREAST LUMPECTOMY Left 10/28/2019   Procedure: RE-EXCISION OF LEFT BREAST LUMPECTOMY;  Surgeon: Rolm Bookbinder, MD;  Location: [place];  Service: General;  Laterality: Left;    Social History   Socioeconomic History  . Marital status: Married    Spouse name: Not on file  . Number of children: Not on file  . Years  of education: Not on file  . Highest education level: Not on file  Occupational History  . Not on file  Tobacco Use  . Smoking status: Former Smoker    Packs/day: 0.50    Years: 8.00    Pack years: 4.00    Types: Cigarettes  . Smokeless tobacco: Never Used  . Tobacco comment: quit 04/15/2014  Substance and Sexual Activity  . Alcohol use: No    Alcohol/week: 0.0 standard drinks  . Drug use: No  . Sexual activity: Not on file  Other Topics Concern  . Not on file  Social History Narrative   Remarried, lives with husband 1 son, he had TBI and short term memory loss after prolonged illness      #Patient lives in [place] with her husband; prior history of smoking/currently vapes.  No significant alcohol use.  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    Social Determinants of Health   Financial Resource Strain:   . Difficulty of Paying Living Expenses:   Food Insecurity:   . Worried About Charity fundraiser in the Last Year:   . Arboriculturist in the Last Year:   Transportation Needs:   . Film/video editor (Medical):   Marland Kitchen Lack of Transportation (Non-Medical):   Physical Activity:   . Days of Exercise per Week:   . Minutes of Exercise per Session:   Stress:   . Feeling of Stress :   Social Connections:   . Frequency of Communication with Friends and Family:   . Frequency of Social Gatherings with Friends and Family:   . Attends Religious Services:   . Active Member of Clubs or Organizations:   . Attends Archivist Meetings:   Marland Kitchen Marital Status:   Intimate Partner Violence:   . Fear of Current or Ex-Partner:   . Emotionally Abused:   Marland Kitchen Physically Abused:   . Sexually Abused:     Family History  Problem Relation Age of Onset  . Hypertension Mother   . Colon cancer Father        d. 40  . Thyroid disease Sister   . Breast cancer Paternal Aunt   . Breast cancer Paternal Grandmother   . Colon cancer Paternal Grandmother   . Breast cancer Cousin         paternal aunt's daughter  . Colon cancer Paternal Uncle        3 pat uncles with colon cancer at unknown ages  . Pancreatic cancer Maternal Uncle        d. 6  . Breast cancer Paternal Aunt      Current Outpatient Medications:  .  acetaminophen (TYLENOL) 500 MG tablet, Take 1,000 mg by mouth every 6 (six) hours as needed for mild pain or headache., Disp: , Rfl:  .  butalbital-acetaminophen-caffeine (FIORICET) 50-325-40 MG tablet, TAKE 1 TABLET BY MOUTH EVERY 4 TO 6 HOURS AS NEEDED FOR HEADACHE, Disp: 90 tablet, Rfl: 3 .  diazepam (VALIUM) 10 MG tablet, TAKE 1 TABLET BY MOUTH AT BEDTIME AS NEEDED FOR SLEEP, Disp: 30 tablet, Rfl: 1 .  dimenhyDRINATE (DRAMAMINE) 50 MG tablet, Take 50 mg by mouth every 8 (eight) hours as needed., Disp: , Rfl:  .  FLUoxetine (PROZAC) 20 MG capsule, TAKE 3 CAPSULES BY MOUTH DAILY, Disp: 270 capsule, Rfl: 1 .  FLUoxetine (PROZAC) 20 MG tablet, Take 60 mg by mouth daily., Disp: , Rfl:  .  ibuprofen (ADVIL) 100 MG tablet, Take 100 mg by mouth every 6 (six) hours as needed for fever., Disp: , Rfl:  .  levothyroxine (SYNTHROID) 88 MCG tablet, TAKE 1 TABLET BY MOUTH DAILY EXCEPT ON SUNDAYS TAKE 1/2 TABLET, Disp: 90 tablet, Rfl: 1 .  lidocaine-prilocaine (EMLA) cream, Apply 1 application topically as needed., Disp: 30 g, Rfl: 0 .  ondansetron (ZOFRAN) 8 MG tablet, One pill every 8 hours as needed for nausea/vomitting., Disp: 40 tablet, Rfl: 1 .  oxyCODONE (OXY IR/ROXICODONE) 5 MG immediate release tablet, Take 1 tablet (5 mg total) by mouth every 6 (six) hours as needed for moderate pain, severe pain or breakthrough pain., Disp: 10 tablet, Rfl: 0 .  potassium chloride SA (KLOR-CON) 20 MEQ tablet, 1 pill twice a day, Disp: 60 tablet, Rfl: 3 .  prochlorperazine (COMPAZINE) 10 MG tablet, Take 1 tablet (10 mg total) by mouth every 6 (six) hours as needed for nausea or vomiting., Disp: 40 tablet, Rfl: 1 .  dexamethasone (DECADRON) 4 MG tablet, 1 tablet AM & PM; Take Day prior  and day after chemo. (Patient not taking: Reported on 12/11/2019), Disp: 30 tablet, Rfl: 0 .  diphenoxylate-atropine (LOMOTIL) 2.5-0.025 MG tablet, Take 1 tablet by mouth 4 (four) times daily as needed for diarrhea or loose stools. Take it along with immodium (Patient not taking: Reported on 12/11/2019), Disp: 30 tablet, Rfl: 0 .  loperamide (IMODIUM A-D) 2 MG tablet, Take 4 mg by mouth 4 (four) times daily as needed for diarrhea or loose stools., Disp: , Rfl:   Physical exam:  Vitals:   12/11/19 0845  BP: (!) 106/57  Pulse: (!) 115  Resp: 20  Temp: 98.8 F (37.1 C)  TempSrc: Tympanic   Physical Exam Constitutional:      Appearance: Normal appearance.  HENT:     Head: Normocephalic and atraumatic.  Eyes:     Pupils: Pupils are equal, round, and reactive to light.  Cardiovascular:     Rate and Rhythm: Normal rate and regular rhythm.     Heart sounds: Normal heart sounds. No murmur.  Pulmonary:     Effort: Pulmonary effort is normal.     Breath sounds: Normal breath sounds. No wheezing.  Abdominal:     General: Bowel sounds are normal. There is no distension.     Palpations: Abdomen is soft.     Tenderness: There is no abdominal tenderness.  Musculoskeletal:        General: Normal range of motion.     Cervical back: Normal range of motion.  Skin:    General: Skin is warm and dry.     Findings: No rash.  Neurological:     Mental Status: She is alert and oriented to person, place, and time.  Psychiatric:        Judgment: Judgment normal.      CMP Latest Ref Rng & Units 12/11/2019  Glucose 70 - 99 mg/dL 107(H)  BUN 6 - 20 mg/dL 8  Creatinine 0.44 - 1.00 mg/dL 0.75  Sodium 135 - 145 mmol/L 138  Potassium 3.5 - 5.1 mmol/L 3.7  Chloride 98 - 111 mmol/L 105  CO2 22 - 32 mmol/L 23  Calcium 8.9 - 10.3 mg/dL 8.7(L)  Total Protein 6.5 - 8.1 g/dL -  Total Bilirubin 0.3 - 1.2 mg/dL -  Alkaline Phos 38 - 126 U/L -  AST 15 - 41 U/L -  ALT 0 - 44 U/L -   CBC Latest Ref Rng &  Units 12/11/2019  WBC 4.0 - 10.5 K/uL 6.5  Hemoglobin 12.0 - 15.0 g/dL 10.1(L)  Hematocrit 36.0 - 46.0 % 31.3(L)  Platelets 150 - 400 K/uL 400    No images are attached to the encounter.  No results found.   Assessment and plan- Patient is a 59 y.o. female who presents with above history for follow-up after cycle 2 TC with Neulasta.  Jessica Barnes was scheduled for this appointment to evaluate for possible IV fluids post cycle 2 of TC with Neulasta.  After cycle 1, lab work showed she was dehydrated along with some dizzy spells nausea and generalized weakness.  Today, she feels great.  Lab work looks as expected with slight decline in her hemoglobin 10.1 (11.1 previously) and an ANC of 1.3.  Plan: Patient will not require IV fluids today. Lab work stable.   Disposition: RT [place] as needed. RTC as scheduled with Dr. Rogue Bussing with cycle 3.   Visit Diagnosis 1. Carcinoma of upper-outer quadrant of left breast in female, estrogen receptor positive ([place])   2. Dehydration     Patient expressed understanding and was in agreement with this plan. She also understands that She can call clinic at any time with any questions, concerns, or complaints.   Greater than 50% was spent in counseling and coordination of care with this patient including but not limited to discussion of the relevant topics above (See A&P) including, but not limited to diagnosis and management of acute and chronic medical conditions.   Thank you for allowing me to participate in the care of this very pleasant patient.    Jacquelin Hawking, NP [place] [facility] - [phone] Pager- [phone] 12/12/2019 2:21 PM

## 2019-12-18 NOTE — Progress Notes (Signed)

## 2019-12-24 ENCOUNTER — Other Ambulatory Visit: Payer: Self-pay

## 2019-12-24 ENCOUNTER — Encounter: Payer: Self-pay | Admitting: Internal Medicine

## 2019-12-25 ENCOUNTER — Inpatient Hospital Stay: Payer: No Typology Code available for payment source | Attending: Internal Medicine

## 2019-12-25 ENCOUNTER — Inpatient Hospital Stay (HOSPITAL_BASED_OUTPATIENT_CLINIC_OR_DEPARTMENT_OTHER): Payer: No Typology Code available for payment source | Admitting: Internal Medicine

## 2019-12-25 ENCOUNTER — Encounter: Payer: Self-pay | Admitting: Internal Medicine

## 2019-12-25 ENCOUNTER — Inpatient Hospital Stay: Payer: No Typology Code available for payment source

## 2019-12-25 VITALS — HR 94

## 2019-12-25 DIAGNOSIS — Z170 Estrogen receptor positive status [ER+]: Secondary | ICD-10-CM

## 2019-12-25 DIAGNOSIS — Z87891 Personal history of nicotine dependence: Secondary | ICD-10-CM | POA: Diagnosis not present

## 2019-12-25 DIAGNOSIS — Z7689 Persons encountering health services in other specified circumstances: Secondary | ICD-10-CM | POA: Diagnosis not present

## 2019-12-25 DIAGNOSIS — Z79899 Other long term (current) drug therapy: Secondary | ICD-10-CM | POA: Insufficient documentation

## 2019-12-25 DIAGNOSIS — R197 Diarrhea, unspecified: Secondary | ICD-10-CM | POA: Insufficient documentation

## 2019-12-25 DIAGNOSIS — Z95828 Presence of other vascular implants and grafts: Secondary | ICD-10-CM

## 2019-12-25 DIAGNOSIS — F418 Other specified anxiety disorders: Secondary | ICD-10-CM | POA: Diagnosis not present

## 2019-12-25 DIAGNOSIS — R21 Rash and other nonspecific skin eruption: Secondary | ICD-10-CM | POA: Diagnosis not present

## 2019-12-25 DIAGNOSIS — G4700 Insomnia, unspecified: Secondary | ICD-10-CM | POA: Insufficient documentation

## 2019-12-25 DIAGNOSIS — E876 Hypokalemia: Secondary | ICD-10-CM | POA: Diagnosis not present

## 2019-12-25 DIAGNOSIS — C50412 Malignant neoplasm of upper-outer quadrant of left female breast: Secondary | ICD-10-CM | POA: Diagnosis not present

## 2019-12-25 DIAGNOSIS — Z5111 Encounter for antineoplastic chemotherapy: Secondary | ICD-10-CM | POA: Insufficient documentation

## 2019-12-25 DIAGNOSIS — J449 Chronic obstructive pulmonary disease, unspecified: Secondary | ICD-10-CM | POA: Diagnosis not present

## 2019-12-25 DIAGNOSIS — F4310 Post-traumatic stress disorder, unspecified: Secondary | ICD-10-CM | POA: Insufficient documentation

## 2019-12-25 DIAGNOSIS — L509 Urticaria, unspecified: Secondary | ICD-10-CM | POA: Insufficient documentation

## 2019-12-25 DIAGNOSIS — E785 Hyperlipidemia, unspecified: Secondary | ICD-10-CM | POA: Insufficient documentation

## 2019-12-25 DIAGNOSIS — R7989 Other specified abnormal findings of blood chemistry: Secondary | ICD-10-CM | POA: Insufficient documentation

## 2019-12-25 DIAGNOSIS — Z803 Family history of malignant neoplasm of breast: Secondary | ICD-10-CM | POA: Diagnosis not present

## 2019-12-25 DIAGNOSIS — Z800 Family history of malignant neoplasm of digestive organs: Secondary | ICD-10-CM | POA: Diagnosis not present

## 2019-12-25 DIAGNOSIS — E039 Hypothyroidism, unspecified: Secondary | ICD-10-CM | POA: Diagnosis not present

## 2019-12-25 LAB — CBC WITH DIFFERENTIAL/PLATELET
Abs Immature Granulocytes: 0.03 10*3/uL (ref 0.00–0.07)
Basophils Absolute: 0 10*3/uL (ref 0.0–0.1)
Basophils Relative: 0 %
Eosinophils Absolute: 0 10*3/uL (ref 0.0–0.5)
Eosinophils Relative: 0 %
HCT: 32.8 % — ABNORMAL LOW (ref 36.0–46.0)
Hemoglobin: 10.6 g/dL — ABNORMAL LOW (ref 12.0–15.0)
Immature Granulocytes: 0 %
Lymphocytes Relative: 8 %
Lymphs Abs: 0.7 10*3/uL (ref 0.7–4.0)
MCH: 29.9 pg (ref 26.0–34.0)
MCHC: 32.3 g/dL (ref 30.0–36.0)
MCV: 92.4 fL (ref 80.0–100.0)
Monocytes Absolute: 0.1 10*3/uL (ref 0.1–1.0)
Monocytes Relative: 2 %
Neutro Abs: 8.1 10*3/uL — ABNORMAL HIGH (ref 1.7–7.7)
Neutrophils Relative %: 90 %
Platelets: 557 10*3/uL — ABNORMAL HIGH (ref 150–400)
RBC: 3.55 MIL/uL — ABNORMAL LOW (ref 3.87–5.11)
RDW: 14.3 % (ref 11.5–15.5)
WBC: 9 10*3/uL (ref 4.0–10.5)
nRBC: 0 % (ref 0.0–0.2)

## 2019-12-25 LAB — COMPREHENSIVE METABOLIC PANEL
ALT: 21 U/L (ref 0–44)
AST: 21 U/L (ref 15–41)
Albumin: 4.2 g/dL (ref 3.5–5.0)
Alkaline Phosphatase: 123 U/L (ref 38–126)
Anion gap: 10 (ref 5–15)
BUN: 17 mg/dL (ref 6–20)
CO2: 19 mmol/L — ABNORMAL LOW (ref 22–32)
Calcium: 9.4 mg/dL (ref 8.9–10.3)
Chloride: 107 mmol/L (ref 98–111)
Creatinine, Ser: 0.75 mg/dL (ref 0.44–1.00)
GFR calc Af Amer: >60 mL/min (ref >60)
GFR calc non Af Amer: >60 mL/min (ref >60)
Glucose, Bld: 174 mg/dL — ABNORMAL HIGH (ref 70–99)
Potassium: 4 mmol/L (ref 3.5–5.1)
Sodium: 136 mmol/L (ref 135–145)
Total Bilirubin: 0.5 mg/dL (ref 0.3–1.2)
Total Protein: 7.8 g/dL (ref 6.5–8.1)

## 2019-12-25 MED ORDER — SODIUM CHLORIDE 0.9 % IV SOLN
60.0000 mg/m2 | Freq: Once | INTRAVENOUS | Status: AC
  Administered 2019-12-25: 100 mg via INTRAVENOUS
  Filled 2019-12-25: qty 10

## 2019-12-25 MED ORDER — PALONOSETRON HCL INJECTION 0.25 MG/5ML
0.2500 mg | Freq: Once | INTRAVENOUS | Status: AC
  Administered 2019-12-25: 0.25 mg via INTRAVENOUS
  Filled 2019-12-25: qty 5

## 2019-12-25 MED ORDER — SODIUM CHLORIDE 0.9% FLUSH
10.0000 mL | INTRAVENOUS | Status: DC | PRN
  Administered 2019-12-25: 10 mL via INTRAVENOUS
  Filled 2019-12-25: qty 10

## 2019-12-25 MED ORDER — SODIUM CHLORIDE 0.9 % IV SOLN
Freq: Once | INTRAVENOUS | Status: AC
  Filled 2019-12-25: qty 250

## 2019-12-25 MED ORDER — SODIUM CHLORIDE 0.9 % IV SOLN
617.0000 mg/m2 | Freq: Once | INTRAVENOUS | Status: AC
  Administered 2019-12-25: 1000 mg via INTRAVENOUS
  Filled 2019-12-25: qty 50

## 2019-12-25 MED ORDER — SODIUM CHLORIDE 0.9 % IV SOLN
10.0000 mg | Freq: Once | INTRAVENOUS | Status: AC
  Administered 2019-12-25: 10 mg via INTRAVENOUS
  Filled 2019-12-25: qty 10

## 2019-12-25 MED ORDER — HEPARIN SOD (PORK) LOCK FLUSH 100 UNIT/ML IV SOLN
500.0000 [IU] | Freq: Once | INTRAVENOUS | Status: AC | PRN
  Administered 2019-12-25: 500 [IU]
  Filled 2019-12-25: qty 5

## 2019-12-25 MED ORDER — HEPARIN SOD (PORK) LOCK FLUSH 100 UNIT/ML IV SOLN
INTRAVENOUS | Status: AC
  Filled 2019-12-25: qty 5

## 2019-12-25 MED ORDER — LORAZEPAM 0.5 MG PO TABS
1.0000 mg | ORAL_TABLET | Freq: Once | ORAL | Status: AC
  Administered 2019-12-25: 1 mg via ORAL
  Filled 2019-12-25: qty 2

## 2019-12-25 NOTE — Progress Notes (Signed)
[name], [name], [name]  #  Oncology History Overview Note  # DEC 2020- LEFT BREAST-TWO PRIM ARIES:   #A]-IMC; ER/PR >90%; Her 2-IHC-2+; FISH-NEG-2 ; MRI- 3.7 CM; s/p s/p lumpectomy; s/p reexcision-final margins negative.  Stage Ib.    #B] Bx- IMC; G-1; ER/PR-pos; her2 NEG. s/p lumpectomy; margins negative [as per Dr.Wakefield; discussed at [facility] tumor conference]  # MARCH 31st 2021- START Taxotere [60 mg/m]; Cytoxan; growth factor  #Extreme anxiety/chronic diarrhea/chronic severe hypokalemia/history of migraines/depression  Used estrogen and progesterone therapy: on premarin for post- menopausal x 2-3 years- discontinued in JAN 2021.   # SURVIVORSHIP:   DIAGNOSIS: Breast cancer  STAGE: IB & I     ;  GOALS: Cure  CURRENT/MOST RECENT THERAPY : TC    Carcinoma of upper-outer quadrant of left breast in female, estrogen receptor positive ([place])  08/14/2019 Initial Diagnosis   Carcinoma of upper-outer quadrant of left breast in female, estrogen receptor positive ([place])   09/10/2019 Genetic Testing   Negative genetic testing on the STAT and common hereditary cancer panel.  The Common Hereditary Gene Panel offered by [facility] includes sequencing and/or deletion duplication testing of the following 48 genes: APC, ATM, AXIN2, BARD1, BMPR1A, BRCA1, BRCA2, BRIP1, CDH1, CDK4, CDKN2A (p14ARF), CDKN2A (p16INK4a), CHEK2, CTNNA1, DICER1, EPCAM (Deletion/duplication testing only), GREM1 (promoter region deletion/duplication testing only), KIT, MEN1, MLH1, MSH2, MSH3, MSH6, MUTYH, NBN, NF1, NHTL1, PALB2, PDGFRA, PMS2, POLD1, POLE, PTEN, RAD50, RAD51C, RAD51D, RNF43, SDHB, SDHC, SDHD, SMAD4, SMARCA4. STK11, TP53, TSC1, TSC2, and VHL.  The following genes were evaluated for sequence changes only: SDHA and HOXB13  c.251G>A variant only. The report date is September 10, 2019.   10/03/2019 Cancer Staging   Staging form: Breast, AJCC 8th Edition - Pathologic stage from 10/03/2019: Stage IB (pT2, pN1a, cM0, G2, ER+, PR+, HER2-) - Signed by Gardenia Phlegm, NP on 10/16/2019   10/09/2019 - 10/09/2019 Chemotherapy   The patient had DOXOrubicin (ADRIAMYCIN) chemo injection 98 mg, 60 mg/m2, Intravenous,  Once, 0 of 4 cycles PALONOSETRON HCL INJECTION 0.25 MG/5ML, 0.25 mg, Intravenous,  Once, 0 of 4 cycles pegfilgrastim-jmdb (FULPHILA) injection 6 mg, 6 mg, Subcutaneous,  Once, 0 of 4 cycles cyclophosphamide (CYTOXAN) 980 mg in sodium chloride 0.9 % 250 mL chemo infusion, 600 mg/m2, Intravenous,  Once, 0 of 4 cycles PACLitaxel (TAXOL) 132 mg in sodium chloride 0.9 % 250 mL chemo infusion (</= 19m/m2), 80 mg/m2, Intravenous,  Once, 0 of 12 cycles FOSAPREPITANT IV INFUSION 150 MG, 150 mg, Intravenous,  Once, 0 of 4 cycles  for chemotherapy treatment.    11/13/2019 -  Chemotherapy   The patient had palonosetron (ALOXI) injection 0.25 mg, 0.25 mg, Intravenous,  Once, 3 of 4 cycles Administration: 0.25 mg (11/13/2019), 0.25 mg (12/04/2019), 0.25 mg (12/25/2019) pegfilgrastim-jmdb (FULPHILA) injection 6 mg, 6 mg, Subcutaneous,  Once, 3 of 4 cycles Administration: 6 mg (11/14/2019), 6 mg (12/05/2019), 6 mg (12/26/2019) cyclophosphamide (CYTOXAN) 1,000 mg in sodium chloride 0.9 % 250 mL chemo infusion, 980 mg, Intravenous,  Once, 3 of 4 cycles Administration: 1,000 mg (11/13/2019), 1,000 mg (12/04/2019), 1,000 mg (12/25/2019) DOCEtaxel (TAXOTERE) 100 mg in sodium chloride 0.9 % 250 mL chemo infusion, 60 mg/m2 = 100 mg (100 % of original dose 60 mg/m2), Intravenous,  Once, 3 of 4 cycles Dose modification: 60 mg/m2 (original dose 60 mg/m2,  Cycle 1, Reason: Provider Judgment) Administration: 100 mg (11/13/2019), 100 mg (12/04/2019), 100 mg (12/25/2019)  for chemotherapy treatment.       HISTORY OF PRESENTING ILLNESS:  Jessica Barnes 59 y.o.  female with of breast cancer  [T2N1-ER/PR positive HER-2 negative; T1N1-ER/PR positive HER-2 negative 2 separate primaries] on Cytoxan Taxotere is here for follow-up.  Patient is currently status post 2 cycles of chemotherapy.  Patient was evaluated recently management clinic with IV fluids; otherwise tolerated fairly well.  Patient's diarrhea is improved.  Complains of shortness of breath/fatigue on exertion.  Denies any chest pain.   Review of Systems  Constitutional: Positive for malaise/fatigue. Negative for chills, diaphoresis, fever and weight loss.  HENT: Negative for nosebleeds and sore throat.   Eyes: Negative for double vision.  Respiratory: Positive for shortness of breath. Negative for cough, hemoptysis, sputum production and wheezing.   Cardiovascular: Negative for chest pain, palpitations, orthopnea and leg swelling.  Gastrointestinal: Positive for diarrhea. Negative for abdominal pain, blood in stool, constipation, heartburn, melena, nausea and vomiting.  Genitourinary: Negative for dysuria, frequency and urgency.  Musculoskeletal: Negative for back pain and joint pain.  Skin: Negative.  Negative for itching and rash.  Neurological: Positive for headaches. Negative for dizziness, tingling, focal weakness and weakness.  Endo/Heme/Allergies: Does not bruise/bleed easily.  Psychiatric/Behavioral: Positive for depression. The patient is nervous/anxious. The patient does not have insomnia.      MEDICAL HISTORY:  Past Medical History:  Diagnosis Date  . Anxiety 10/13/96  . Chest pain    atypical, hosp for that and depression 8/10-8/11/08  . COPD (chronic obstructive pulmonary disease) ([place]) 09/15/05   by x-ray  . Depression 10/13/96   hospital 8/10-8/11/08.  notable history: son with TBI after MVA  . Family history of breast cancer   . Family history of colon cancer   . Family history of pancreatic cancer   . Hyperlipidemia 09/15/98  . Hypothyroidism  01/14/96  . Migraine    27 yoa  . NSVD (normal spontaneous vaginal delivery)    x 1  . PONV (postoperative nausea and vomiting)   . PTSD (post-traumatic stress disorder)    likely PTSD after son's MVA/TBI in 2007, s/p counseling    SURGICAL HISTORY: Past Surgical History:  Procedure Laterality Date  . APPENDECTOMY     59 years of age  . BREAST BIOPSY Left 08/06/2019   [place] bx/ ribbon clip/ path pending  . BREAST BIOPSY Left 09/06/2019   [place] path pending heart clip  . BREAST LUMPECTOMY WITH RADIOACTIVE SEED AND SENTINEL LYMPH NODE BIOPSY Left 10/01/2019   Procedure: LEFT BREAST LUMPECTOMY WITH BRACKETED RADIOACTIVE SEEDS AND LEFT AXILLARY SENTINEL LYMPH NODE BIOPSY;  Surgeon: Rolm Bookbinder, MD;  Location: [facility];  Service: General;  Laterality: Left;  . PORTACATH PLACEMENT Right 10/28/2019   Procedure: INSERTION PORT-A-CATH WITH ULTRASOUND GUIDANCE;  Surgeon: Rolm Bookbinder, MD;  Location: [facility];  Service: General;  Laterality: Right;  . RE-EXCISION OF BREAST LUMPECTOMY Left 10/28/2019   Procedure: RE-EXCISION OF LEFT BREAST LUMPECTOMY;  Surgeon: Rolm Bookbinder, MD;  Location: [facility];  Service: General;  Laterality: Left;    SOCIAL HISTORY: Social History   Socioeconomic History  . Marital status: Married    Spouse name: Not on file  . Number of children: Not on file  . Years of education: Not on file  . Highest education level: Not on file  Occupational History  . Not on file  Tobacco Use  . Smoking status: Former Smoker    Packs/day: 0.50    Years: 8.00    Pack years: 4.00    Types: Cigarettes  . Smokeless tobacco: Never Used  . Tobacco comment: quit 04/15/2014  Substance and Sexual Activity  . Alcohol use: No    Alcohol/week: 0.0 standard drinks  . Drug use: No  . Sexual activity: Not on file  Other Topics Concern  . Not on file  Social History Narrative   Remarried, lives with husband 1 son, he had  TBI and short term memory loss after prolonged illness      #Patient lives in [place] with her husband; prior history of smoking/currently vapes.  No significant alcohol use.  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    Social Determinants of Health   Financial Resource Strain:   . Difficulty of Paying Living Expenses:   Food Insecurity:   . Worried About Charity fundraiser in the Last Year:   . Arboriculturist in the Last Year:   Transportation Needs:   . Film/video editor (Medical):   Marland Kitchen Lack of Transportation (Non-Medical):   Physical Activity:   . Days of Exercise per Week:   . Minutes of Exercise per Session:   Stress:   . Feeling of Stress :   Social Connections:   . Frequency of Communication with Friends and Family:   . Frequency of Social Gatherings with Friends and Family:   . Attends Religious Services:   . Active Member of Clubs or Organizations:   . Attends Archivist Meetings:   Marland Kitchen Marital Status:   Intimate Partner Violence:   . Fear of Current or Ex-Partner:   . Emotionally Abused:   Marland Kitchen Physically Abused:   . Sexually Abused:     FAMILY HISTORY: Family History  Problem Relation Age of Onset  . Hypertension Mother   . Colon cancer Father        d. 21  . Thyroid disease Sister   . Breast cancer Paternal Aunt   . Breast cancer Paternal Grandmother   . Colon cancer Paternal Grandmother   . Breast cancer Cousin        paternal aunt's daughter  . Colon cancer Paternal Uncle        3 pat uncles with colon cancer at unknown ages  . Pancreatic cancer Maternal Uncle        d. 11  . Breast cancer Paternal Aunt     ALLERGIES:  is allergic to sulfamethoxazole-trimethoprim and codeine.  MEDICATIONS:  Current Outpatient Medications  Medication Sig Dispense Refill  . acetaminophen (TYLENOL) 500 MG tablet Take 1,000 mg by mouth every 6 (six) hours as needed for mild pain or headache.    . butalbital-acetaminophen-caffeine (FIORICET) 50-325-40 MG  tablet TAKE 1 TABLET BY MOUTH EVERY 4 TO 6 HOURS AS NEEDED FOR HEADACHE 90 tablet 3  . dexamethasone (DECADRON) 4 MG tablet 1 tablet AM & PM; Take Day prior and day after chemo. 30 tablet 0  . diazepam (VALIUM) 10 MG tablet TAKE 1 TABLET BY MOUTH AT BEDTIME AS NEEDED FOR SLEEP 30 tablet 1  . FLUoxetine (PROZAC) 20 MG capsule TAKE 3 CAPSULES BY MOUTH DAILY 270 capsule 1  . FLUoxetine (PROZAC) 20 MG tablet Take 60 mg by mouth daily.    Marland Kitchen ibuprofen (ADVIL) 100 MG tablet Take 100 mg by mouth every 6 (six) hours as needed for fever.    . levothyroxine (SYNTHROID)  88 MCG tablet TAKE 1 TABLET BY MOUTH DAILY EXCEPT ON SUNDAYS TAKE 1/2 TABLET 90 tablet 1  . lidocaine-prilocaine (EMLA) cream Apply 1 application topically as needed. 30 g 0  . potassium chloride SA (KLOR-CON) 20 MEQ tablet 1 pill twice a day 60 tablet 3  . dimenhyDRINATE (DRAMAMINE) 50 MG tablet Take 50 mg by mouth every 8 (eight) hours as needed.    . diphenoxylate-atropine (LOMOTIL) 2.5-0.025 MG tablet Take 1 tablet by mouth 4 (four) times daily as needed for diarrhea or loose stools. Take it along with immodium (Patient not taking: Reported on 12/11/2019) 30 tablet 0  . loperamide (IMODIUM A-D) 2 MG tablet Take 4 mg by mouth 4 (four) times daily as needed for diarrhea or loose stools.    . ondansetron (ZOFRAN) 8 MG tablet One pill every 8 hours as needed for nausea/vomitting. (Patient not taking: Reported on 12/24/2019) 40 tablet 1  . oxyCODONE (OXY IR/ROXICODONE) 5 MG immediate release tablet Take 1 tablet (5 mg total) by mouth every 6 (six) hours as needed for moderate pain, severe pain or breakthrough pain. (Patient not taking: Reported on 12/24/2019) 10 tablet 0  . prochlorperazine (COMPAZINE) 10 MG tablet Take 1 tablet (10 mg total) by mouth every 6 (six) hours as needed for nausea or vomiting. (Patient not taking: Reported on 12/24/2019) 40 tablet 1   No current facility-administered medications for this visit.      Marland Kitchen  PHYSICAL  EXAMINATION: ECOG PERFORMANCE STATUS: 0 - Asymptomatic  Vitals:   12/25/19 0910 12/25/19 0930  BP: 135/79   Pulse: (!) 114   Resp: 20   Temp: (!) 96.2 F (35.7 C)   SpO2:  98%   Filed Weights   12/25/19 0910  Weight: 132 lb 2 oz (59.9 kg)    Physical Exam  Constitutional: She is oriented to person, place, and time and well-developed, well-nourished, and in no distress.  Accompanied by husband.  HENT:  Head: Normocephalic and atraumatic.  Mouth/Throat: Oropharynx is clear and moist. No oropharyngeal exudate.  Eyes: Pupils are equal, round, and reactive to light.  Cardiovascular: Normal rate and regular rhythm.  Pulmonary/Chest: Effort normal and breath sounds normal. No respiratory distress. She has no wheezes.  Abdominal: Soft. Bowel sounds are normal. She exhibits no distension and no mass. There is no abdominal tenderness. There is no rebound and no guarding.  Musculoskeletal:        General: No tenderness or edema. Normal range of motion.     Cervical back: Normal range of motion and neck supple.  Neurological: She is alert and oriented to person, place, and time.  Skin: Skin is warm.  Psychiatric: Affect normal.  Anxious   LABORATORY DATA:  I have reviewed the data as listed Lab Results  Component Value Date   WBC 9.0 12/25/2019   HGB 10.6 (L) 12/25/2019   HCT 32.8 (L) 12/25/2019   MCV 92.4 12/25/2019   PLT 557 (H) 12/25/2019   Recent Labs    08/14/19 1544 09/06/19 1054 10/10/19 1325 11/06/19 0950 11/13/19 0831 11/20/19 0825 12/04/19 0837 12/11/19 0838 12/25/19 0857  NA  --   --   --    < > 135   < > 137 138 136  K  --   --   --    < > 4.0   < > 4.3 3.7 4.0  CL  --   --   --    < > 103   < > 107 105  107  CO2  --   --   --    < > 24   < > 21* 23 19*  GLUCOSE  --   --   --    < > 95   < > 169* 107* 174*  BUN  --   --   --    < > 12   < > 15 8 17   CREATININE  --   --   --    < > 0.76   < > 0.73 0.75 0.75  CALCIUM   < >  --  8.6*   < > 9.4   < > 9.2 8.7*  9.4  GFRNONAA  --   --   --    < > >60   < > >60 >60 >60  GFRAA  --   --   --    < > >60   < > >60 >60 >60  PROT  --  7.6 6.5   < > 7.7  --  7.7  --  7.8  ALBUMIN  --  3.8 4.0   < > 4.1  --  4.2  --  4.2  AST   < > 27 23   < > 67*  --  30  --  21  ALT   < > 18 21   < > 75*  --  50*  --  21  ALKPHOS   < > 174* 200*   < > 227*  --  177*  --  123  BILITOT  --  0.4 <0.2   < > 0.4  --  0.4  --  0.5  BILIDIR  --  <0.1 0.07  --   --   --   --   --   --   IBILI  --  NOT CALCULATED  --   --   --   --   --   --   --    < > = values in this interval not displayed.    RADIOGRAPHIC STUDIES: I have personally reviewed the radiological images as listed and agreed with the findings in the report. No results found.  ASSESSMENT & PLAN:   Carcinoma of upper-outer quadrant of left breast in female, estrogen receptor positive ([place]) # Left breast cancer-2 separate primaries- A] T2N1-ER/PR positive HER-2 negative;  B] T1N1 ER/PR positive HER-2 negative. On adjuvant chemotherapy Taxotere [60 mg/m] Cytoxan q. every 3 weeks.  # proceed with cycle #3 Labs today reviewed;  acceptable for treatment today-slightly elevated LFTs [see below].   # Diarrhea- G-1; sec to Taxotere-stable  #Chronic hypokalemia/diarrhea-potassium 4.0 today. STABLE; potassium supplementation;   #Anxiety-recommend adding Ativan prior to chemotherapy. STABLE.   #Elevated LFTs- alkaline phosphatase-123; improved- STABLE.   # SOB with exertion-/tachycardia-anxiety/chemotherapy rather than acute process like PE.  Discussed with the patient that if her symptoms get worse; recommend immediate CTA.  # Insomnia/likely secondary to anxiety.-Recommend adding melatonin to her Valium.  # DISPOSITION:  # Chemo today; # D-2 fulphila # Follow up on 18th or 19th/next week [facility]- labs- cbc/bmp; possible IVfs over 1 hour # in 3 week- MD; labs- cbc/cmp; Cytoxan-Taxotere; D-2 fulphila- Dr.B  All questions were answered. The patient/family knows to  call the clinic with any problems, questions or concerns.    Cammie Sickle, MD 12/29/2019 5:05 PM

## 2019-12-25 NOTE — Assessment & Plan Note (Addendum)
#  Left breast cancer-2 separate primaries- A] T2N1-ER/PR positive HER-2 negative;  B] T1N1 ER/PR positive HER-2 negative. On adjuvant chemotherapy Taxotere [60 mg/m] Cytoxan q. every 3 weeks.  # proceed with cycle #3 Labs today reviewed;  acceptable for treatment today-slightly elevated LFTs [see below].   # Diarrhea- G-1; sec to Taxotere-stable  #Chronic hypokalemia/diarrhea-potassium 4.0 today. STABLE; potassium supplementation;   #Anxiety-recommend adding Ativan prior to chemotherapy. STABLE.   #Elevated LFTs- alkaline phosphatase-123; improved- STABLE.   # SOB with exertion-/tachycardia-anxiety/chemotherapy rather than acute process like PE.  Discussed with the patient that if her symptoms get worse; recommend immediate CTA.  # Insomnia/likely secondary to anxiety.-Recommend adding melatonin to her Valium.  # DISPOSITION:  # Chemo today; # D-2 fulphila # Follow up on 18th or 19th/next week [facility]- labs- cbc/bmp; possible IVfs over 1 hour # in 3 week- MD; labs- cbc/cmp; Cytoxan-Taxotere; D-2 fulphila- Dr.B

## 2019-12-26 ENCOUNTER — Other Ambulatory Visit: Payer: Self-pay

## 2019-12-26 ENCOUNTER — Inpatient Hospital Stay: Payer: No Typology Code available for payment source

## 2019-12-26 DIAGNOSIS — C50412 Malignant neoplasm of upper-outer quadrant of left female breast: Secondary | ICD-10-CM

## 2019-12-26 DIAGNOSIS — Z170 Estrogen receptor positive status [ER+]: Secondary | ICD-10-CM

## 2019-12-26 MED ORDER — PEGFILGRASTIM-JMDB 6 MG/0.6ML ~~LOC~~ SOSY
6.0000 mg | PREFILLED_SYRINGE | Freq: Once | SUBCUTANEOUS | Status: AC
  Administered 2019-12-26: 6 mg via SUBCUTANEOUS
  Filled 2019-12-26: qty 0.6

## 2019-12-31 ENCOUNTER — Inpatient Hospital Stay: Payer: No Typology Code available for payment source

## 2019-12-31 ENCOUNTER — Inpatient Hospital Stay: Payer: No Typology Code available for payment source | Admitting: Oncology

## 2020-01-03 ENCOUNTER — Other Ambulatory Visit: Payer: Self-pay

## 2020-01-03 ENCOUNTER — Encounter: Payer: Self-pay | Admitting: Oncology

## 2020-01-03 ENCOUNTER — Inpatient Hospital Stay: Payer: No Typology Code available for payment source

## 2020-01-03 ENCOUNTER — Inpatient Hospital Stay (HOSPITAL_BASED_OUTPATIENT_CLINIC_OR_DEPARTMENT_OTHER): Payer: No Typology Code available for payment source | Admitting: Oncology

## 2020-01-03 VITALS — BP 110/65 | HR 105 | Temp 98.5°F | Resp 18

## 2020-01-03 DIAGNOSIS — Z170 Estrogen receptor positive status [ER+]: Secondary | ICD-10-CM

## 2020-01-03 DIAGNOSIS — C50412 Malignant neoplasm of upper-outer quadrant of left female breast: Secondary | ICD-10-CM

## 2020-01-03 DIAGNOSIS — Z95828 Presence of other vascular implants and grafts: Secondary | ICD-10-CM

## 2020-01-03 DIAGNOSIS — L509 Urticaria, unspecified: Secondary | ICD-10-CM

## 2020-01-03 DIAGNOSIS — R21 Rash and other nonspecific skin eruption: Secondary | ICD-10-CM

## 2020-01-03 DIAGNOSIS — E860 Dehydration: Secondary | ICD-10-CM

## 2020-01-03 MED ORDER — SODIUM CHLORIDE 0.9% FLUSH
10.0000 mL | Freq: Once | INTRAVENOUS | Status: AC
  Administered 2020-01-03: 10 mL via INTRAVENOUS
  Filled 2020-01-03: qty 10

## 2020-01-03 MED ORDER — PREDNISONE 10 MG (21) PO TBPK
ORAL_TABLET | ORAL | 0 refills | Status: DC
Start: 2020-01-03 — End: 2020-04-28

## 2020-01-03 MED ORDER — FAMOTIDINE IN NACL 20-0.9 MG/50ML-% IV SOLN
20.0000 mg | Freq: Once | INTRAVENOUS | Status: AC
  Administered 2020-01-03: 20 mg via INTRAVENOUS
  Filled 2020-01-03: qty 50

## 2020-01-03 MED ORDER — TRIAMCINOLONE ACETONIDE 0.025 % EX OINT
1.0000 | TOPICAL_OINTMENT | Freq: Three times a day (TID) | CUTANEOUS | 0 refills | Status: DC
Start: 2020-01-03 — End: 2020-04-28

## 2020-01-03 MED ORDER — HEPARIN SOD (PORK) LOCK FLUSH 100 UNIT/ML IV SOLN
500.0000 [IU] | Freq: Once | INTRAVENOUS | Status: AC
  Administered 2020-01-03: 500 [IU]
  Filled 2020-01-03: qty 5

## 2020-01-03 MED ORDER — SODIUM CHLORIDE 0.9 % IV SOLN
INTRAVENOUS | Status: DC
  Filled 2020-01-03 (×2): qty 250

## 2020-01-03 MED ORDER — HYDROXYZINE HCL 25 MG PO TABS: 25.0000 mg | ORAL_TABLET | Freq: Three times a day (TID) | ORAL | 0 refills | Status: DC | PRN

## 2020-01-03 NOTE — Patient Instructions (Signed)
I am so sorry you are not feeling well today.  We gave you while in clinic 20 mg IV Pepcid and a liter of normal saline which is just IV fluids.  I am not sure how you developed hives.  I do recommend continuing steroids for the next several days to see if we can get rid of these.  I have called in a prescription for a steroid taper.  I also called you in a prescription for hydroxyzine which should help with itching.  I recommend you take Benadryl 50 mg 3 times daily along with Pepcid 20 mg 2 times daily.  You can get these over-the-counter.  Once the hives have resolved you can stop the pepcid and benadryl- continue steroids until you are done with your taper.   Please call with questions.   Faythe Casa, NP 01/03/2020 11:00 AM

## 2020-01-03 NOTE — Progress Notes (Signed)
Symptom Management Consult note [facility]  [phone] [phone]  Patient Care Team: Tonia Ghent, [name], [name]   Name of the patient: Jessica Barnes  [id number]  01-21-1961   Date of visit: 02/03/2020   Diagnosis-breast cancer  Chief complaint/ Reason for visit-hives  Heme/Onc history:  Oncology History Overview Note  # DEC 2020- LEFT BREAST-TWO PRIM ARIES:   #A]-IMC; ER/PR >90%; Her 2-IHC-2+; FISH-NEG-2 ; MRI- 3.7 CM; s/p s/p lumpectomy; s/p reexcision-final margins negative.  Stage Ib.    #B] Bx- IMC; G-1; ER/PR-pos; her2 NEG. s/p lumpectomy; margins negative [as per Dr.Wakefield; discussed at [facility] tumor conference]  # MARCH 31st 2021- START Taxotere [60 mg/m]; Cytoxan; growth factor  #Extreme anxiety/chronic diarrhea/chronic severe hypokalemia/history of migraines/depression  Used estrogen and progesterone therapy: on premarin for post- menopausal x 2-3 years- discontinued in JAN 2021.   # SURVIVORSHIP:   DIAGNOSIS: Breast cancer  STAGE: IB & I     ;  GOALS: Cure  CURRENT/MOST RECENT THERAPY : TC    Carcinoma of upper-outer quadrant of left breast in female, estrogen receptor positive ([place])  08/14/2059 Initial Diagnosis   Carcinoma of upper-outer quadrant of left breast in female, estrogen receptor positive ([place])   09/10/2019 Genetic Testing   Negative genetic testing on the STAT and common hereditary cancer panel.  The Common Hereditary Gene Panel offered by [facility] includes sequencing and/or deletion duplication testing of the following 48 genes: APC, ATM, AXIN2, BARD1, BMPR1A, BRCA1, BRCA2, BRIP1, CDH1, CDK4, CDKN2A (p14ARF), CDKN2A (p16INK4a), CHEK2, CTNNA1, DICER1, EPCAM (Deletion/duplication testing only), GREM1 (promoter region deletion/duplication testing only), KIT, MEN1, MLH1, MSH2, MSH3, MSH6, MUTYH, NBN, NF1, NHTL1, PALB2,  PDGFRA, PMS2, POLD1, POLE, PTEN, RAD50, RAD51C, RAD51D, RNF43, SDHB, SDHC, SDHD, SMAD4, SMARCA4. STK11, TP53, TSC1, TSC2, and VHL.  The following genes were evaluated for sequence changes only: SDHA and HOXB13 c.251G>A variant only. The report date is September 10, 2019.   10/03/2019 Cancer Staging   Staging form: Breast, AJCC 8th Edition - Pathologic stage from 10/03/2019: Stage IB (pT2, pN1a, cM0, G2, ER+, PR+, HER2-) - Signed by Gardenia Phlegm, NP on 10/16/2019   10/09/2019 - 10/09/2019 Chemotherapy   The patient had DOXOrubicin (ADRIAMYCIN) chemo injection 98 mg, 60 mg/m2, Intravenous,  Once, 0 of 4 cycles PALONOSETRON HCL INJECTION 0.25 MG/5ML, 0.25 mg, Intravenous,  Once, 0 of 4 cycles pegfilgrastim-jmdb (FULPHILA) injection 6 mg, 6 mg, Subcutaneous,  Once, 0 of 4 cycles cyclophosphamide (CYTOXAN) 980 mg in sodium chloride 0.9 % 250 mL chemo infusion, 600 mg/m2, Intravenous,  Once, 0 of 4 cycles PACLitaxel (TAXOL) 132 mg in sodium chloride 0.9 % 250 mL chemo infusion (</= 49m/m2), 80 mg/m2, Intravenous,  Once, 0 of 12 cycles FOSAPREPITANT IV INFUSION 150 MG, 150 mg, Intravenous,  Once, 0 of 4 cycles  for chemotherapy treatment.    11/13/2019 -  Chemotherapy   The patient had palonosetron (ALOXI) injection 0.25 mg, 0.25 mg, Intravenous,  Once, 3 of 4 cycles Administration: 0.25 mg (11/13/2019), 0.25 mg (12/04/2019), 0.25 mg (12/25/2019) pegfilgrastim-jmdb (FULPHILA) injection 6 mg, 6 mg, Subcutaneous,  Once, 3 of 4 cycles Administration: 6 mg (11/14/2019), 6 mg (12/05/2019), 6 mg (12/26/2019) cyclophosphamide (CYTOXAN) 1,000 mg in sodium chloride 0.9 % 250 mL chemo infusion, 980 mg, Intravenous,  Once, 3 of 4 cycles Administration: 1,000 mg (11/13/2019), 1,000 mg (12/04/2019), 1,000 mg (12/25/2019) DOCEtaxel (TAXOTERE) 100 mg in sodium chloride 0.9 %  250 mL chemo infusion, 60 mg/m2 = 100 mg (100 % of original dose 60 mg/m2), Intravenous,  Once, 3 of 4 cycles Dose modification: 60 mg/m2 (original  dose 60 mg/m2, Cycle 1, Reason: Provider Judgment) Administration: 100 mg (11/13/2019), 100 mg (12/04/2019), 100 mg (12/25/2019)  for chemotherapy treatment.      Interval history-patient presents to symptom management today with past medical history significant for migraine, hypothyroidism, hyperlipidemia, anxiety and depression and joint pain who was recently diagnosed with left breast cancer status post 3 cycles of TC with on pro-Neulasta support.  Last treatment was given on 12/25/2019.  She has been followed closely in our clinic and was seen after her first infusion for fatigue and body aches requiring IV fluids antiemetics and steroids.  She tolerated cycle 2 well.  Patient called last night complaining of a rash and she was given a prescription for dexamethasone 8 mg and instructed to take Benadryl and present to clinic this morning for further evaluation.  She describes the rash as itchy and painful.  Rash began on her back and is spread to her chest arms, torso and legs.  Denies any known precipitating event.  Notes hives as a child with unknown agent.  Admits to being out in the sun yesterday for approximately 30 minutes well.  States she wore a long sleeve shirt and hat.  Her back was minimally exposed.  She denies any new medications, laundry detergent, lotions or sunscreen.  Notes insomnia due to itching.  Denies nausea, vomiting, constipation or diarrhea. Appetite is stable.  ECOG FS:1 - Symptomatic but completely ambulatory  Review of systems- Review of Systems  Constitutional: Positive for malaise/fatigue. Negative for chills, fever and weight loss.  HENT: Negative for congestion, ear pain and tinnitus.   Eyes: Negative.  Negative for blurred vision and double vision.  Respiratory: Negative.  Negative for cough, sputum production and shortness of breath.   Cardiovascular: Negative.  Negative for chest pain, palpitations and leg swelling.  Gastrointestinal: Negative.  Negative for  abdominal pain, constipation, diarrhea, nausea and vomiting.  Genitourinary: Negative for dysuria, frequency and urgency.  Musculoskeletal: Negative for back pain and falls.  Skin: Positive for itching and rash.  Neurological: Negative.  Negative for weakness and headaches.  Endo/Heme/Allergies: Negative.  Does not bruise/bleed easily.  Psychiatric/Behavioral: Negative.  Negative for depression. The patient is not nervous/anxious and does not have insomnia.      Current treatment-cycle 3 TC with Neulasta last given on 12/25/2019.  Allergies  Allergen Reactions  . Sulfamethoxazole-Trimethoprim     Lip and tongue edema  . Codeine     GI upset     Past Medical History:  Diagnosis Date  . Anxiety 10/13/96  . Chest pain    atypical, hosp for that and depression 8/10-8/11/08  . COPD (chronic obstructive pulmonary disease) ([place]) 09/15/05   by x-ray  . Depression 10/13/96   hospital 8/10-8/11/08.  notable history: son with TBI after MVA  . Family history of breast cancer   . Family history of colon cancer   . Family history of pancreatic cancer   . Hyperlipidemia 09/15/98  . Hypothyroidism 01/14/96  . Migraine    27 yoa  . NSVD (normal spontaneous vaginal delivery)    x 1  . PONV (postoperative nausea and vomiting)   . PTSD (post-traumatic stress disorder)    likely PTSD after son's MVA/TBI in 2007, s/p counseling     Past Surgical History:  Procedure Laterality Date  . APPENDECTOMY  60 years of age  . BREAST BIOPSY Left 08/06/2019   [place] bx/ ribbon clip/ path pending  . BREAST BIOPSY Left 09/06/2019   [place] path pending heart clip  . BREAST LUMPECTOMY WITH RADIOACTIVE SEED AND SENTINEL LYMPH NODE BIOPSY Left 10/01/2019   Procedure: LEFT BREAST LUMPECTOMY WITH BRACKETED RADIOACTIVE SEEDS AND LEFT AXILLARY SENTINEL LYMPH NODE BIOPSY;  Surgeon: Rolm Bookbinder, MD;  Location: [place];  Service: General;  Laterality: Left;  . PORTACATH PLACEMENT Right 10/28/2019    Procedure: INSERTION PORT-A-CATH WITH ULTRASOUND GUIDANCE;  Surgeon: Rolm Bookbinder, MD;  Location: [place];  Service: General;  Laterality: Right;  . RE-EXCISION OF BREAST LUMPECTOMY Left 10/28/2019   Procedure: RE-EXCISION OF LEFT BREAST LUMPECTOMY;  Surgeon: Rolm Bookbinder, MD;  Location: [place];  Service: General;  Laterality: Left;    Social History   Socioeconomic History  . Marital status: Married    Spouse name: Not on file  . Number of children: Not on file  . Years of education: Not on file  . Highest education level: Not on file  Occupational History  . Not on file  Tobacco Use  . Smoking status: Former Smoker    Packs/day: 0.50    Years: 8.00    Pack years: 4.00    Types: Cigarettes  . Smokeless tobacco: Never Used  . Tobacco comment: quit 04/15/2014  Substance and Sexual Activity  . Alcohol use: No    Alcohol/week: 0.0 standard drinks  . Drug use: No  . Sexual activity: Not on file  Other Topics Concern  . Not on file  Social History Narrative   Remarried, lives with husband 1 son, he had TBI and short term memory loss after prolonged illness      #Patient lives in [place] with her husband; prior history of smoking/currently vapes.  No significant alcohol use.  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    Social Determinants of Health   Financial Resource Strain:   . Difficulty of Paying Living Expenses:   Food Insecurity:   . Worried About Charity fundraiser in the Last Year:   . Arboriculturist in the Last Year:   Transportation Needs:   . Film/video editor (Medical):   Marland Kitchen Lack of Transportation (Non-Medical):   Physical Activity:   . Days of Exercise per Week:   . Minutes of Exercise per Session:   Stress:   . Feeling of Stress :   Social Connections:   . Frequency of Communication with Friends and Family:   . Frequency of Social Gatherings with Friends and Family:   . Attends Religious Services:   .  Active Member of Clubs or Organizations:   . Attends Archivist Meetings:   Marland Kitchen Marital Status:   Intimate Partner Violence:   . Fear of Current or Ex-Partner:   . Emotionally Abused:   Marland Kitchen Physically Abused:   . Sexually Abused:     Family History  Problem Relation Age of Onset  . Hypertension Mother   . Colon cancer Father        d. 68  . Thyroid disease Sister   . Breast cancer Paternal Aunt   . Breast cancer Paternal Grandmother   . Colon cancer Paternal Grandmother   . Breast cancer Cousin        paternal aunt's daughter  . Colon cancer Paternal Uncle        3 pat uncles with colon cancer  at unknown ages  . Pancreatic cancer Maternal Uncle        d. 54  . Breast cancer Paternal Aunt      Current Outpatient Medications:  .  acetaminophen (TYLENOL) 500 MG tablet, Take 1,000 mg by mouth every 6 (six) hours as needed for mild pain or headache., Disp: , Rfl:  .  butalbital-acetaminophen-caffeine (FIORICET) 50-325-40 MG tablet, TAKE 1 TABLET BY MOUTH EVERY 4 TO 6 HOURS AS NEEDED FOR HEADACHE, Disp: 90 tablet, Rfl: 3 .  dexamethasone (DECADRON) 4 MG tablet, 1 tablet AM & PM; Take Day prior and day after chemo., Disp: 30 tablet, Rfl: 0 .  diazepam (VALIUM) 10 MG tablet, TAKE 1 TABLET BY MOUTH AT BEDTIME AS NEEDED FOR SLEEP, Disp: 30 tablet, Rfl: 1 .  FLUoxetine (PROZAC) 20 MG capsule, TAKE 3 CAPSULES BY MOUTH DAILY, Disp: 270 capsule, Rfl: 1 .  FLUoxetine (PROZAC) 20 MG tablet, Take 60 mg by mouth daily., Disp: , Rfl:  .  ibuprofen (ADVIL) 100 MG tablet, Take 100 mg by mouth every 6 (six) hours as needed for fever., Disp: , Rfl:  .  levothyroxine (SYNTHROID) 88 MCG tablet, TAKE 1 TABLET BY MOUTH DAILY EXCEPT ON SUNDAYS TAKE 1/2 TABLET, Disp: 90 tablet, Rfl: 1 .  lidocaine-prilocaine (EMLA) cream, Apply 1 application topically as needed., Disp: 30 g, Rfl: 0 .  potassium chloride SA (KLOR-CON) 20 MEQ tablet, 1 pill twice a day, Disp: 60 tablet, Rfl: 3 .  dimenhyDRINATE  (DRAMAMINE) 50 MG tablet, Take 50 mg by mouth every 8 (eight) hours as needed., Disp: , Rfl:  .  diphenoxylate-atropine (LOMOTIL) 2.5-0.025 MG tablet, Take 1 tablet by mouth 4 (four) times daily as needed for diarrhea or loose stools. Take it along with immodium (Patient not taking: Reported on 12/11/2019), Disp: 30 tablet, Rfl: 0 .  hydrOXYzine (ATARAX/VISTARIL) 25 MG tablet, Take 1 tablet (25 mg total) by mouth 3 (three) times daily as needed., Disp: 30 tablet, Rfl: 0 .  loperamide (IMODIUM A-D) 2 MG tablet, Take 4 mg by mouth 4 (four) times daily as needed for diarrhea or loose stools., Disp: , Rfl:  .  ondansetron (ZOFRAN) 8 MG tablet, One pill every 8 hours as needed for nausea/vomitting. (Patient not taking: Reported on 12/24/2019), Disp: 40 tablet, Rfl: 1 .  oxyCODONE (OXY IR/ROXICODONE) 5 MG immediate release tablet, Take 1 tablet (5 mg total) by mouth every 6 (six) hours as needed for moderate pain, severe pain or breakthrough pain. (Patient not taking: Reported on 12/24/2019), Disp: 10 tablet, Rfl: 0 .  predniSONE (STERAPRED UNI-PAK 21 TAB) 10 MG (21) TBPK tablet, Take as directed., Disp: 21 tablet, Rfl: 0 .  prochlorperazine (COMPAZINE) 10 MG tablet, Take 1 tablet (10 mg total) by mouth every 6 (six) hours as needed for nausea or vomiting. (Patient not taking: Reported on 12/24/2019), Disp: 40 tablet, Rfl: 1 .  triamcinolone (KENALOG) 0.025 % ointment, Apply 1 application topically 3 (three) times daily., Disp: 80 g, Rfl: 0  Physical exam:  Vitals:   01/03/20 0919  BP: 110/65  Pulse: (!) 105  Resp: 18  Temp: 98.5 F (36.9 C)  TempSrc: Oral   Physical Exam Constitutional:      Appearance: Normal appearance.  HENT:     Head: Normocephalic and atraumatic.  Eyes:     Pupils: Pupils are equal, round, and reactive to light.  Cardiovascular:     Rate and Rhythm: Normal rate and regular rhythm.     Heart sounds: Normal  heart sounds. No murmur.  Pulmonary:     Effort: Pulmonary effort is  normal.     Breath sounds: Normal breath sounds. No wheezing.  Abdominal:     General: Bowel sounds are normal. There is no distension.     Palpations: Abdomen is soft.     Tenderness: There is no abdominal tenderness.  Musculoskeletal:        General: Normal range of motion.     Cervical back: Normal range of motion.  Skin:    General: Skin is warm and dry.     Findings: Rash present. Rash is urticarial.       Neurological:     Mental Status: She is alert and oriented to person, place, and time.  Psychiatric:        Judgment: Judgment normal.      CMP Latest Ref Rng & Units 12/25/2019  Glucose 70 - 99 mg/dL 174(H)  BUN 6 - 20 mg/dL 17  Creatinine 0.44 - 1.00 mg/dL 0.75  Sodium 135 - 145 mmol/L 136  Potassium 3.5 - 5.1 mmol/L 4.0  Chloride 98 - 111 mmol/L 107  CO2 22 - 32 mmol/L 19(L)  Calcium 8.9 - 10.3 mg/dL 9.4  Total Protein 6.5 - 8.1 g/dL 7.8  Total Bilirubin 0.3 - 1.2 mg/dL 0.5  Alkaline Phos 38 - 126 U/L 123  AST 15 - 41 U/L 21  ALT 0 - 44 U/L 21   CBC Latest Ref Rng & Units 12/25/2019  WBC 4.0 - 10.5 K/uL 9.0  Hemoglobin 12.0 - 15.0 g/dL 10.6(L)  Hematocrit 36.0 - 46.0 % 32.8(L)  Platelets 150 - 400 K/uL 557(H)    No images are attached to the encounter.  No results found.   Assessment and plan- Patient is a 59 y.o. female who presents today after her third cycle of TC chemotherapy with complaints of hives for about 12 hours.   Reviewed side effects of TC chemotherapy.  Taxotere (56 to 76% alopecia, dermal reaction 20 to 48%, nail disease 11 to 41%, skin rash 6 to 21%).  Cytoxan (typically GI and bone marrow suppression, alopecia).  Unclear etiology of hives.  She denies any new medications.  Her last chemo treatment was on 12/26/2019. Delayed rash/hives from chemo would be less likely.  Patient states she has history of hives as a child induced by anxiety and stress.  We will give her some IV fluids today along with Pepcid IV 20 mg while in clinic.  She will  need to continue steroids, p.o. Pepcid, p.o. Benadryl and I will prescribe her Atarax for itching.  Symptoms should resolve in the next couple days.  Plan: 1 L NaCl while in clinic 20 mg Pepcid IV Rx prednisone taper x5 days Rx Atarax 25 mg 3 times daily prn for itching Rx Kenalog cream prn Continue Benadryl and Pepcid OTC prn  Addendum:  Spoke with Shanon Brow, [facility] has been who stated hives improved dramatically by Saturday morning.  She is doing much better and denies any additional rash at this time.  Visit Diagnosis 1. Carcinoma of upper-outer quadrant of left breast in female, estrogen receptor positive ([place])   2. Hives     Patient expressed understanding and was in agreement with this plan. She also understands that She can call clinic at any time with any questions, concerns, or complaints.   Greater than 50% was spent in counseling and coordination of care with this patient including but not limited to discussion of the relevant topics  above (See A&P) including, but not limited to diagnosis and management of acute and chronic medical conditions.   Thank you for allowing me to participate in the care of this very pleasant patient.    Jacquelin Hawking, NP [place] [facility] Cell - [phone] Pager- [phone] 01/06/2020 9:56 AM

## 2020-01-08 NOTE — Progress Notes (Signed)

## 2020-01-15 ENCOUNTER — Inpatient Hospital Stay: Payer: No Typology Code available for payment source

## 2020-01-15 ENCOUNTER — Inpatient Hospital Stay (HOSPITAL_BASED_OUTPATIENT_CLINIC_OR_DEPARTMENT_OTHER): Payer: No Typology Code available for payment source | Admitting: Internal Medicine

## 2020-01-15 ENCOUNTER — Telehealth: Payer: Self-pay | Admitting: *Deleted

## 2020-01-15 ENCOUNTER — Other Ambulatory Visit: Payer: Self-pay

## 2020-01-15 ENCOUNTER — Telehealth: Payer: Self-pay | Admitting: Internal Medicine

## 2020-01-15 ENCOUNTER — Inpatient Hospital Stay: Payer: No Typology Code available for payment source | Attending: Internal Medicine

## 2020-01-15 DIAGNOSIS — Z87891 Personal history of nicotine dependence: Secondary | ICD-10-CM | POA: Insufficient documentation

## 2020-01-15 DIAGNOSIS — Z9221 Personal history of antineoplastic chemotherapy: Secondary | ICD-10-CM | POA: Insufficient documentation

## 2020-01-15 DIAGNOSIS — Z170 Estrogen receptor positive status [ER+]: Secondary | ICD-10-CM | POA: Insufficient documentation

## 2020-01-15 DIAGNOSIS — G4700 Insomnia, unspecified: Secondary | ICD-10-CM | POA: Diagnosis not present

## 2020-01-15 DIAGNOSIS — C50412 Malignant neoplasm of upper-outer quadrant of left female breast: Secondary | ICD-10-CM | POA: Diagnosis not present

## 2020-01-15 DIAGNOSIS — E785 Hyperlipidemia, unspecified: Secondary | ICD-10-CM | POA: Diagnosis not present

## 2020-01-15 DIAGNOSIS — E876 Hypokalemia: Secondary | ICD-10-CM | POA: Diagnosis not present

## 2020-01-15 DIAGNOSIS — Z95828 Presence of other vascular implants and grafts: Secondary | ICD-10-CM

## 2020-01-15 DIAGNOSIS — R197 Diarrhea, unspecified: Secondary | ICD-10-CM | POA: Diagnosis not present

## 2020-01-15 DIAGNOSIS — J449 Chronic obstructive pulmonary disease, unspecified: Secondary | ICD-10-CM | POA: Diagnosis not present

## 2020-01-15 DIAGNOSIS — R519 Headache, unspecified: Secondary | ICD-10-CM | POA: Insufficient documentation

## 2020-01-15 DIAGNOSIS — F419 Anxiety disorder, unspecified: Secondary | ICD-10-CM | POA: Diagnosis not present

## 2020-01-15 DIAGNOSIS — R7989 Other specified abnormal findings of blood chemistry: Secondary | ICD-10-CM | POA: Diagnosis not present

## 2020-01-15 DIAGNOSIS — E039 Hypothyroidism, unspecified: Secondary | ICD-10-CM | POA: Insufficient documentation

## 2020-01-15 DIAGNOSIS — Z800 Family history of malignant neoplasm of digestive organs: Secondary | ICD-10-CM | POA: Insufficient documentation

## 2020-01-15 DIAGNOSIS — Z5111 Encounter for antineoplastic chemotherapy: Secondary | ICD-10-CM | POA: Insufficient documentation

## 2020-01-15 DIAGNOSIS — F4310 Post-traumatic stress disorder, unspecified: Secondary | ICD-10-CM | POA: Insufficient documentation

## 2020-01-15 DIAGNOSIS — Z79899 Other long term (current) drug therapy: Secondary | ICD-10-CM | POA: Insufficient documentation

## 2020-01-15 DIAGNOSIS — F418 Other specified anxiety disorders: Secondary | ICD-10-CM | POA: Insufficient documentation

## 2020-01-15 DIAGNOSIS — Z803 Family history of malignant neoplasm of breast: Secondary | ICD-10-CM | POA: Insufficient documentation

## 2020-01-15 LAB — COMPREHENSIVE METABOLIC PANEL
ALT: 28 U/L (ref 0–44)
AST: 27 U/L (ref 15–41)
Albumin: 4.2 g/dL (ref 3.5–5.0)
Alkaline Phosphatase: 105 U/L (ref 38–126)
Anion gap: 12 (ref 5–15)
BUN: 14 mg/dL (ref 6–20)
CO2: 20 mmol/L — ABNORMAL LOW (ref 22–32)
Calcium: 9.4 mg/dL (ref 8.9–10.3)
Chloride: 105 mmol/L (ref 98–111)
Creatinine, Ser: 0.76 mg/dL (ref 0.44–1.00)
GFR calc Af Amer: >60 mL/min (ref >60)
GFR calc non Af Amer: >60 mL/min (ref >60)
Glucose, Bld: 198 mg/dL — ABNORMAL HIGH (ref 70–99)
Potassium: 3.8 mmol/L (ref 3.5–5.1)
Sodium: 137 mmol/L (ref 135–145)
Total Bilirubin: 0.7 mg/dL (ref 0.3–1.2)
Total Protein: 7.5 g/dL (ref 6.5–8.1)

## 2020-01-15 LAB — CBC WITH DIFFERENTIAL/PLATELET
Abs Immature Granulocytes: 0.04 10*3/uL (ref 0.00–0.07)
Basophils Absolute: 0 10*3/uL (ref 0.0–0.1)
Basophils Relative: 0 %
Eosinophils Absolute: 0 10*3/uL (ref 0.0–0.5)
Eosinophils Relative: 0 %
HCT: 32.5 % — ABNORMAL LOW (ref 36.0–46.0)
Hemoglobin: 10.5 g/dL — ABNORMAL LOW (ref 12.0–15.0)
Immature Granulocytes: 0 %
Lymphocytes Relative: 7 %
Lymphs Abs: 0.7 10*3/uL (ref 0.7–4.0)
MCH: 29.9 pg (ref 26.0–34.0)
MCHC: 32.3 g/dL (ref 30.0–36.0)
MCV: 92.6 fL (ref 80.0–100.0)
Monocytes Absolute: 0.1 10*3/uL (ref 0.1–1.0)
Monocytes Relative: 1 %
Neutro Abs: 8.6 10*3/uL — ABNORMAL HIGH (ref 1.7–7.7)
Neutrophils Relative %: 92 %
Platelets: 418 10*3/uL — ABNORMAL HIGH (ref 150–400)
RBC: 3.51 MIL/uL — ABNORMAL LOW (ref 3.87–5.11)
RDW: 15.9 % — ABNORMAL HIGH (ref 11.5–15.5)
WBC: 9.4 10*3/uL (ref 4.0–10.5)
nRBC: 0 % (ref 0.0–0.2)

## 2020-01-15 MED ORDER — PALONOSETRON HCL INJECTION 0.25 MG/5ML
0.2500 mg | Freq: Once | INTRAVENOUS | Status: AC
  Administered 2020-01-15: 0.25 mg via INTRAVENOUS
  Filled 2020-01-15: qty 5

## 2020-01-15 MED ORDER — HEPARIN SOD (PORK) LOCK FLUSH 100 UNIT/ML IV SOLN
500.0000 [IU] | Freq: Once | INTRAVENOUS | Status: AC | PRN
  Administered 2020-01-15: 500 [IU]
  Filled 2020-01-15: qty 5

## 2020-01-15 MED ORDER — SODIUM CHLORIDE 0.9 % IV SOLN
60.0000 mg/m2 | Freq: Once | INTRAVENOUS | Status: AC
  Administered 2020-01-15: 100 mg via INTRAVENOUS
  Filled 2020-01-15: qty 10

## 2020-01-15 MED ORDER — SODIUM CHLORIDE 0.9 % IV SOLN
10.0000 mg | Freq: Once | INTRAVENOUS | Status: AC
  Administered 2020-01-15: 10 mg via INTRAVENOUS
  Filled 2020-01-15: qty 10

## 2020-01-15 MED ORDER — SODIUM CHLORIDE 0.9% FLUSH
10.0000 mL | INTRAVENOUS | Status: DC | PRN
  Administered 2020-01-15: 10 mL via INTRAVENOUS
  Filled 2020-01-15: qty 10

## 2020-01-15 MED ORDER — SODIUM CHLORIDE 0.9 % IV SOLN
Freq: Once | INTRAVENOUS | Status: AC
  Filled 2020-01-15: qty 250

## 2020-01-15 MED ORDER — LORAZEPAM 0.5 MG PO TABS
1.0000 mg | ORAL_TABLET | Freq: Once | ORAL | Status: AC
  Administered 2020-01-15: 1 mg via ORAL
  Filled 2020-01-15: qty 2

## 2020-01-15 MED ORDER — DIAZEPAM 10 MG PO TABS: 10.0000 mg | ORAL_TABLET | Freq: Every evening | ORAL | 0 refills | Status: DC | PRN

## 2020-01-15 MED ORDER — SODIUM CHLORIDE 0.9 % IV SOLN
617.0000 mg/m2 | Freq: Once | INTRAVENOUS | Status: AC
  Administered 2020-01-15: 1000 mg via INTRAVENOUS
  Filled 2020-01-15: qty 50

## 2020-01-15 NOTE — Assessment & Plan Note (Addendum)
#  Left breast cancer-2 separate primaries- A] T2N1-ER/PR positive HER-2 negative;  B] T1N1 ER/PR positive HER-2 negative. On adjuvant chemotherapy Taxotere [60 mg/m] Cytoxan q. every 3 weeks.  # proceed with cycle #4 Labs today reviewed;  Labs today reviewed;  acceptable for treatment today.  We will proceed with radiation oncology referral.  Discussed the lack of benefit of surveillance imaging given her overwhelming chance of being cured of this cancer.  # Diarrhea- improved; monitor closely.  #Chronic hypokalemia/diarrhea-potassium 4.0 today.  Stable potassium supplementation;   #Anxiety-continue ativan prior to chemotherapy.  Stable  #Elevated LFTs- alkaline phosphatase-123; improved- STABLE.   # Fatigue- G-2; from chemo; monitor closely.   # Insomnia/likely secondary to anxiety/dex.  Worse.  Recommend continue melatonin to her Valium.new script for valium given.   # DISPOSITION:  # referral to radiation oncology- re: breast cancer # Chemo today; # D-2 fulphila # Follow up on 8th [facility]- labs- cbc/bmp; possible IVfs over 1 hour # in 4 weeks MD; labs- cbc/cmp; NO chemo-Dr.B 

## 2020-01-15 NOTE — Telephone Encounter (Signed)
Jessica Barnes with [facility] called questioning if he should fill the prescription or not for Diazepam 10 mg as she was given a 30 day supply by Dr Damita Dunnings on 12/31/19 with the same directions as you wrote for. Please advise

## 2020-01-15 NOTE — Telephone Encounter (Signed)
On 6/3-left voicemail for the patient's husband-to discuss the issue with Valium prescription.  Recommend calling back.  As per patient pharmacist-patient had received 30 tablets of Valium through PCP on May 18th.

## 2020-01-15 NOTE — Telephone Encounter (Signed)
Dr. Rogue Bussing states that he will contact the patient.

## 2020-01-15 NOTE — Progress Notes (Signed)
[facility] [place] NOTE  Patient Care Team: Tonia Ghent, [name], [name]  #  Oncology History Overview Note  # DEC 2020- LEFT BREAST-TWO PRIM ARIES:   #A]-IMC; ER/PR >90%; Her 2-IHC-2+; FISH-NEG-2 ; MRI- 3.7 CM; s/p s/p lumpectomy; s/p reexcision-final margins negative.  Stage Ib.    #B] Bx- IMC; G-1; ER/PR-pos; her2 NEG. s/p lumpectomy; margins negative [as per Dr.Wakefield; discussed at [facility] tumor conference]  # MARCH 31st 2021- START Taxotere [60 mg/m]; Cytoxan; growth factor  #Extreme anxiety/chronic diarrhea/chronic severe hypokalemia/history of migraines/depression  Used estrogen and progesterone therapy: on premarin for post- menopausal x 2-3 years- discontinued in JAN 2021.   # SURVIVORSHIP:   DIAGNOSIS: Breast cancer  STAGE: IB & I     ;  GOALS: Cure  CURRENT/MOST RECENT THERAPY : TC    Carcinoma of upper-outer quadrant of left breast in female, estrogen receptor positive ([place])  08/14/2019 Initial Diagnosis   Carcinoma of upper-outer quadrant of left breast in female, estrogen receptor positive ([place])   09/10/2019 Genetic Testing   Negative genetic testing on the STAT and common hereditary cancer panel.  The Common Hereditary Gene Panel offered by [facility] includes sequencing and/or deletion duplication testing of the following 48 genes: APC, ATM, AXIN2, BARD1, BMPR1A, BRCA1, BRCA2, BRIP1, CDH1, CDK4, CDKN2A (p14ARF), CDKN2A (p16INK4a), CHEK2, CTNNA1, DICER1, EPCAM (Deletion/duplication testing only), GREM1 (promoter region deletion/duplication testing only), KIT, MEN1, MLH1, MSH2, MSH3, MSH6, MUTYH, NBN, NF1, NHTL1, PALB2, PDGFRA, PMS2, POLD1, POLE, PTEN, RAD50, RAD51C, RAD51D, RNF43, SDHB, SDHC, SDHD, SMAD4, SMARCA4. STK11, TP53, TSC1, TSC2, and VHL.  The following genes were evaluated for sequence changes only: SDHA and HOXB13  c.251G>A variant only. The report date is September 10, 2019.   10/03/2019 Cancer Staging   Staging form: Breast, AJCC 8th Edition - Pathologic stage from 10/03/2019: Stage IB (pT2, pN1a, cM0, G2, ER+, PR+, HER2-) - Signed by Gardenia Phlegm, NP on 10/16/2019   10/09/2019 - 10/09/2019 Chemotherapy   The patient had DOXOrubicin (ADRIAMYCIN) chemo injection 98 mg, 60 mg/m2, Intravenous,  Once, 0 of 4 cycles PALONOSETRON HCL INJECTION 0.25 MG/5ML, 0.25 mg, Intravenous,  Once, 0 of 4 cycles pegfilgrastim-jmdb (FULPHILA) injection 6 mg, 6 mg, Subcutaneous,  Once, 0 of 4 cycles cyclophosphamide (CYTOXAN) 980 mg in sodium chloride 0.9 % 250 mL chemo infusion, 600 mg/m2, Intravenous,  Once, 0 of 4 cycles PACLitaxel (TAXOL) 132 mg in sodium chloride 0.9 % 250 mL chemo infusion (</= 79m/m2), 80 mg/m2, Intravenous,  Once, 0 of 12 cycles FOSAPREPITANT IV INFUSION 150 MG, 150 mg, Intravenous,  Once, 0 of 4 cycles  for chemotherapy treatment.    11/13/2019 -  Chemotherapy   The patient had palonosetron (ALOXI) injection 0.25 mg, 0.25 mg, Intravenous,  Once, 4 of 4 cycles Administration: 0.25 mg (11/13/2019), 0.25 mg (12/04/2019), 0.25 mg (12/25/2019) pegfilgrastim-jmdb (FULPHILA) injection 6 mg, 6 mg, Subcutaneous,  Once, 4 of 4 cycles Administration: 6 mg (11/14/2019), 6 mg (12/05/2019), 6 mg (12/26/2019) cyclophosphamide (CYTOXAN) 1,000 mg in sodium chloride 0.9 % 250 mL chemo infusion, 980 mg, Intravenous,  Once, 4 of 4 cycles Administration: 1,000 mg (11/13/2019), 1,000 mg (12/04/2019), 1,000 mg (12/25/2019) DOCEtaxel (TAXOTERE) 100 mg in sodium chloride 0.9 % 250 mL chemo infusion, 60 mg/m2 = 100 mg (100 % of original dose 60 mg/m2), Intravenous,  Once, 4 of 4 cycles Dose modification: 60 mg/m2 (original dose 60 mg/m2,  Cycle 1, Reason: Provider Judgment) Administration: 100 mg (11/13/2019), 100 mg (12/04/2019), 100 mg (12/25/2019)  for chemotherapy treatment.       HISTORY OF PRESENTING ILLNESS:  Jessica Barnes 59 y.o.  female with of breast cancer  [T2N1-ER/PR positive HER-2 negative; T1N1-ER/PR positive HER-2 negative 2 separate primaries] on Cytoxan Taxotere is here for follow-up.  Patient is currently status post 3 cycles of chemotherapy.  Patient continues to follow-up with [place] in the management clinic approximately week post chemotherapy for IV fluids.  Denies any swelling in the legs.  Denies any diarrhea.  States her headaches have significantly improved since being on chemotherapy.  Complains of mild to moderate fatigue.    Review of Systems  Constitutional: Positive for malaise/fatigue. Negative for chills, diaphoresis, fever and weight loss.  HENT: Negative for nosebleeds and sore throat.   Eyes: Negative for double vision.  Respiratory: Negative for cough, hemoptysis, sputum production and wheezing.   Cardiovascular: Negative for chest pain, palpitations, orthopnea and leg swelling.  Gastrointestinal: Negative for abdominal pain, blood in stool, constipation, heartburn, melena, nausea and vomiting.  Genitourinary: Negative for dysuria, frequency and urgency.  Musculoskeletal: Negative for back pain and joint pain.  Skin: Negative.  Negative for itching and rash.  Neurological: Positive for headaches. Negative for dizziness, tingling, focal weakness and weakness.  Endo/Heme/Allergies: Does not bruise/bleed easily.  Psychiatric/Behavioral: Positive for depression. The patient is nervous/anxious and has insomnia.      MEDICAL HISTORY:  Past Medical History:  Diagnosis Date  . Anxiety 10/13/96  . Chest pain    atypical, hosp for that and depression 8/10-8/11/08  . COPD (chronic obstructive pulmonary disease) ([place]) 09/15/05   by x-ray  . Depression 10/13/96   hospital 8/10-8/11/08.  notable history: son with TBI after MVA  . Family history of breast cancer   . Family history of colon cancer   . Family history of pancreatic cancer   . Hyperlipidemia 09/15/98  .  Hypothyroidism 01/14/96  . Migraine    27 yoa  . NSVD (normal spontaneous vaginal delivery)    x 1  . PONV (postoperative nausea and vomiting)   . PTSD (post-traumatic stress disorder)    likely PTSD after son's MVA/TBI in 2007, s/p counseling    SURGICAL HISTORY: Past Surgical History:  Procedure Laterality Date  . APPENDECTOMY     59 years of age  . BREAST BIOPSY Left 08/06/2019   [place] bx/ ribbon clip/ path pending  . BREAST BIOPSY Left 09/06/2019   [place] path pending heart clip  . BREAST LUMPECTOMY WITH RADIOACTIVE SEED AND SENTINEL LYMPH NODE BIOPSY Left 10/01/2019   Procedure: LEFT BREAST LUMPECTOMY WITH BRACKETED RADIOACTIVE SEEDS AND LEFT AXILLARY SENTINEL LYMPH NODE BIOPSY;  Surgeon: Rolm Bookbinder, MD;  Location: [place];  Service: General;  Laterality: Left;  . PORTACATH PLACEMENT Right 10/28/2019   Procedure: INSERTION PORT-A-CATH WITH ULTRASOUND GUIDANCE;  Surgeon: Rolm Bookbinder, MD;  Location: [place];  Service: General;  Laterality: Right;  . RE-EXCISION OF BREAST LUMPECTOMY Left 10/28/2019   Procedure: RE-EXCISION OF LEFT BREAST LUMPECTOMY;  Surgeon: Rolm Bookbinder, MD;  Location: [facility];  Service: General;  Laterality: Left;    SOCIAL HISTORY: Social History   Socioeconomic History  . Marital status: Married    Spouse name: Not on file  . Number of children: Not on file  . Years of education: Not on file  . Highest education level: Not on file  Occupational History  .  Not on file  Tobacco Use  . Smoking status: Former Smoker    Packs/day: 0.50    Years: 8.00    Pack years: 4.00    Types: Cigarettes  . Smokeless tobacco: Never Used  . Tobacco comment: quit 04/15/2014  Substance and Sexual Activity  . Alcohol use: No    Alcohol/week: 0.0 standard drinks  . Drug use: No  . Sexual activity: Not on file  Other Topics Concern  . Not on file  Social History Narrative   Remarried, lives with husband  1 son, he had TBI and short term memory loss after prolonged illness      #Patient lives in [place] with her husband; prior history of smoking/currently vapes.  No significant alcohol use.  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    Social Determinants of Health   Financial Resource Strain:   . Difficulty of Paying Living Expenses:   Food Insecurity:   . Worried About Charity fundraiser in the Last Year:   . Arboriculturist in the Last Year:   Transportation Needs:   . Film/video editor (Medical):   Marland Kitchen Lack of Transportation (Non-Medical):   Physical Activity:   . Days of Exercise per Week:   . Minutes of Exercise per Session:   Stress:   . Feeling of Stress :   Social Connections:   . Frequency of Communication with Friends and Family:   . Frequency of Social Gatherings with Friends and Family:   . Attends Religious Services:   . Active Member of Clubs or Organizations:   . Attends Archivist Meetings:   Marland Kitchen Marital Status:   Intimate Partner Violence:   . Fear of Current or Ex-Partner:   . Emotionally Abused:   Marland Kitchen Physically Abused:   . Sexually Abused:     FAMILY HISTORY: Family History  Problem Relation Age of Onset  . Hypertension Mother   . Colon cancer Father        d. 66  . Thyroid disease Sister   . Breast cancer Paternal Aunt   . Breast cancer Paternal Grandmother   . Colon cancer Paternal Grandmother   . Breast cancer Cousin        paternal aunt's daughter  . Colon cancer Paternal Uncle        3 pat uncles with colon cancer at unknown ages  . Pancreatic cancer Maternal Uncle        d. 9  . Breast cancer Paternal Aunt     ALLERGIES:  is allergic to sulfamethoxazole-trimethoprim and codeine.  MEDICATIONS:  Current Outpatient Medications  Medication Sig Dispense Refill  . acetaminophen (TYLENOL) 500 MG tablet Take 1,000 mg by mouth every 6 (six) hours as needed for mild pain or headache.    . butalbital-acetaminophen-caffeine  (FIORICET) 50-325-40 MG tablet TAKE 1 TABLET BY MOUTH EVERY 4 TO 6 HOURS AS NEEDED FOR HEADACHE 90 tablet 3  . dexamethasone (DECADRON) 4 MG tablet 1 tablet AM & PM; Take Day prior and day after chemo. 30 tablet 0  . diazepam (VALIUM) 10 MG tablet Take 1 tablet (10 mg total) by mouth at bedtime as needed. for sleep 30 tablet 0  . dimenhyDRINATE (DRAMAMINE) 50 MG tablet Take 50 mg by mouth every 8 (eight) hours as needed.    . diphenoxylate-atropine (LOMOTIL) 2.5-0.025 MG tablet Take 1 tablet by mouth 4 (four) times daily as needed for diarrhea or loose stools. Take it along with immodium (Patient not  taking: Reported on 12/11/2019) 30 tablet 0  . FLUoxetine (PROZAC) 20 MG capsule TAKE 3 CAPSULES BY MOUTH DAILY 270 capsule 1  . FLUoxetine (PROZAC) 20 MG tablet Take 60 mg by mouth daily.    . hydrOXYzine (ATARAX/VISTARIL) 25 MG tablet Take 1 tablet (25 mg total) by mouth 3 (three) times daily as needed. 30 tablet 0  . ibuprofen (ADVIL) 100 MG tablet Take 100 mg by mouth every 6 (six) hours as needed for fever.    . levothyroxine (SYNTHROID) 88 MCG tablet TAKE 1 TABLET BY MOUTH DAILY EXCEPT ON SUNDAYS TAKE 1/2 TABLET 90 tablet 1  . lidocaine-prilocaine (EMLA) cream Apply 1 application topically as needed. 30 g 0  . loperamide (IMODIUM A-D) 2 MG tablet Take 4 mg by mouth 4 (four) times daily as needed for diarrhea or loose stools.    . ondansetron (ZOFRAN) 8 MG tablet One pill every 8 hours as needed for nausea/vomitting. (Patient not taking: Reported on 12/24/2019) 40 tablet 1  . oxyCODONE (OXY IR/ROXICODONE) 5 MG immediate release tablet Take 1 tablet (5 mg total) by mouth every 6 (six) hours as needed for moderate pain, severe pain or breakthrough pain. (Patient not taking: Reported on 12/24/2019) 10 tablet 0  . potassium chloride SA (KLOR-CON) 20 MEQ tablet 1 pill twice a day 60 tablet 3  . predniSONE (STERAPRED UNI-PAK 21 TAB) 10 MG (21) TBPK tablet Take as directed. 21 tablet 0  . prochlorperazine  (COMPAZINE) 10 MG tablet Take 1 tablet (10 mg total) by mouth every 6 (six) hours as needed for nausea or vomiting. (Patient not taking: Reported on 12/24/2019) 40 tablet 1  . triamcinolone (KENALOG) 0.025 % ointment Apply 1 application topically 3 (three) times daily. 80 g 0   No current facility-administered medications for this visit.   Facility-Administered Medications Ordered in Other Visits  Medication Dose Route Frequency Provider Last Rate Last Admin  . cyclophosphamide (CYTOXAN) 1,000 mg in sodium chloride 0.9 % 250 mL chemo infusion  617 mg/m2 (Treatment Plan Recorded) Intravenous Once Charlaine Dalton R, MD      . DOCEtaxel (TAXOTERE) 100 mg in sodium chloride 0.9 % 250 mL chemo infusion  60 mg/m2 (Treatment Plan Recorded) Intravenous Once Charlaine Dalton R, MD 260 mL/hr at 01/15/20 1057 100 mg at 01/15/20 1057  . heparin lock flush 100 unit/mL  500 Units Intracatheter Once PRN Charlaine Dalton R, MD      . sodium chloride flush (NS) 0.9 % injection 10 mL  10 mL Intravenous PRN Cammie Sickle, MD   10 mL at 01/15/20 0853      .  PHYSICAL EXAMINATION: ECOG PERFORMANCE STATUS: 0 - Asymptomatic  Vitals:   01/15/20 0906 01/15/20 0915  BP: (!) 81/58 101/60  Pulse: 98   Resp: 18   Temp: 98.4 F (36.9 C)    Filed Weights   01/15/20 0908  Weight: 133 lb (60.3 kg)    Physical Exam  Constitutional: She is oriented to person, place, and time and well-developed, well-nourished, and in no distress.  Accompanied by husband.  HENT:  Head: Normocephalic and atraumatic.  Mouth/Throat: Oropharynx is clear and moist. No oropharyngeal exudate.  Eyes: Pupils are equal, round, and reactive to light.  Cardiovascular: Normal rate and regular rhythm.  Pulmonary/Chest: Effort normal and breath sounds normal. No respiratory distress. She has no wheezes.  Abdominal: Soft. Bowel sounds are normal. She exhibits no distension and no mass. There is no abdominal tenderness. There  is no rebound and  no guarding.  Musculoskeletal:        General: No tenderness or edema. Normal range of motion.     Cervical back: Normal range of motion and neck supple.  Neurological: She is alert and oriented to person, place, and time.  Skin: Skin is warm.  Psychiatric: Affect normal.  Anxious   LABORATORY DATA:  I have reviewed the data as listed Lab Results  Component Value Date   WBC 9.4 01/15/2020   HGB 10.5 (L) 01/15/2020   HCT 32.5 (L) 01/15/2020   MCV 92.6 01/15/2020   PLT 418 (H) 01/15/2020   Recent Labs    08/14/19 1544 09/06/19 1054 10/10/19 1325 11/06/19 0950 12/04/19 0837 12/04/19 0837 12/11/19 0838 12/25/19 0857 01/15/20 0846  NA  --   --   --    < > 137   < > 138 136 137  K  --   --   --    < > 4.3   < > 3.7 4.0 3.8  CL  --   --   --    < > 107   < > 105 107 105  CO2  --   --   --    < > 21*   < > 23 19* 20*  GLUCOSE  --   --   --    < > 169*   < > 107* 174* 198*  BUN  --   --   --    < > 15   < > 8 17 14   CREATININE  --   --   --    < > 0.73   < > 0.75 0.75 0.76  CALCIUM   < >  --  8.6*   < > 9.2   < > 8.7* 9.4 9.4  GFRNONAA  --   --   --    < > >60   < > >60 >60 >60  GFRAA  --   --   --    < > >60   < > >60 >60 >60  PROT  --  7.6 6.5   < > 7.7  --   --  7.8 7.5  ALBUMIN  --  3.8 4.0   < > 4.2  --   --  4.2 4.2  AST   < > 27 23   < > 30  --   --  21 27  ALT   < > 18 21   < > 50*  --   --  21 28  ALKPHOS   < > 174* 200*   < > 177*  --   --  123 105  BILITOT  --  0.4 <0.2   < > 0.4  --   --  0.5 0.7  BILIDIR  --  <0.1 0.07  --   --   --   --   --   --   IBILI  --  NOT CALCULATED  --   --   --   --   --   --   --    < > = values in this interval not displayed.    RADIOGRAPHIC STUDIES: I have personally reviewed the radiological images as listed and agreed with the findings in the report. No results found.  ASSESSMENT & PLAN:   Carcinoma of upper-outer quadrant of left breast in female, estrogen receptor positive ([place]) # Left breast cancer-2  separate primaries- A] T2N1-ER/PR positive HER-2 negative;  B] T1N1 ER/PR positive HER-2 negative. On adjuvant chemotherapy Taxotere [60 mg/m] Cytoxan q. every 3 weeks.  # proceed with cycle #4 Labs today reviewed;  Labs today reviewed;  acceptable for treatment today.  We will proceed with radiation oncology referral.  Discussed the lack of benefit of surveillance imaging given her overwhelming chance of being cured of this cancer.  # Diarrhea- improved; monitor closely.  #Chronic hypokalemia/diarrhea-potassium 4.0 today.  Stable potassium supplementation;   #Anxiety-continue ativan prior to chemotherapy.  Stable  #Elevated LFTs- alkaline phosphatase-123; improved- STABLE.   # Fatigue- G-2; from chemo; monitor closely.   # Insomnia/likely secondary to anxiety/dex.  Worse.  Recommend continue melatonin to her Valium.new script for valium given.   # DISPOSITION:  # referral to radiation oncology- re: breast cancer # Chemo today; # D-2 fulphila # Follow up on [facility]th [facility]- labs- cbc/bmp; possible IVfs over 1 hour # in 4 weeks MD; labs- cbc/cmp; NO chemo-Dr.B  All questions were answered. The patient/family knows to call the clinic with any problems, questions or concerns.    Cammie Sickle, MD 01/15/2020 11:03 AM

## 2020-01-15 NOTE — Telephone Encounter (Signed)
Please let pharmacy know if they are to fill prescription once you speak with patient.

## 2020-01-15 NOTE — Telephone Encounter (Signed)
Pharmacy informed that Dr B will call patient, they will not fill our prescription at this time unless we call them and tell to to fill it

## 2020-01-16 ENCOUNTER — Telehealth: Payer: Self-pay | Admitting: Internal Medicine

## 2020-01-16 ENCOUNTER — Other Ambulatory Visit: Payer: Self-pay | Admitting: Internal Medicine

## 2020-01-16 ENCOUNTER — Inpatient Hospital Stay: Payer: No Typology Code available for payment source

## 2020-01-16 DIAGNOSIS — C50412 Malignant neoplasm of upper-outer quadrant of left female breast: Secondary | ICD-10-CM

## 2020-01-16 DIAGNOSIS — Z170 Estrogen receptor positive status [ER+]: Secondary | ICD-10-CM

## 2020-01-16 MED ORDER — PEGFILGRASTIM-JMDB 6 MG/0.6ML ~~LOC~~ SOSY
6.0000 mg | PREFILLED_SYRINGE | Freq: Once | SUBCUTANEOUS | Status: AC
  Administered 2020-01-16: 6 mg via SUBCUTANEOUS
  Filled 2020-01-16: qty 0.6

## 2020-01-16 NOTE — Telephone Encounter (Signed)
6/03-I spoke to patient's husband regarding Valium prescription.  States that she does have Valium pills as ordered by her PCP [May 18].  Patient will call us for refill- when needed.   GB

## 2020-01-21 ENCOUNTER — Inpatient Hospital Stay: Payer: No Typology Code available for payment source | Admitting: Oncology

## 2020-01-21 ENCOUNTER — Inpatient Hospital Stay: Payer: No Typology Code available for payment source

## 2020-01-22 ENCOUNTER — Telehealth: Payer: Self-pay | Admitting: *Deleted

## 2020-01-22 NOTE — Telephone Encounter (Signed)
Left vm for patient's husband to follow-up on patient's canceled apt yesterday. Patient did not feel up to coming to [facility] apt yesterday due to weakness and fatigue. I spoke with husband yesterday. Pt had refused the [facility] apt. Per husband pt did have several episodes of diarrhea this past weekend.

## 2020-02-12 ENCOUNTER — Telehealth: Payer: Self-pay | Admitting: Internal Medicine

## 2020-02-12 ENCOUNTER — Inpatient Hospital Stay: Payer: No Typology Code available for payment source | Admitting: Internal Medicine

## 2020-02-12 ENCOUNTER — Inpatient Hospital Stay: Payer: No Typology Code available for payment source

## 2020-02-12 ENCOUNTER — Other Ambulatory Visit: Payer: Self-pay | Admitting: *Deleted

## 2020-02-12 DIAGNOSIS — Z170 Estrogen receptor positive status [ER+]: Secondary | ICD-10-CM

## 2020-02-12 DIAGNOSIS — C50412 Malignant neoplasm of upper-outer quadrant of left female breast: Secondary | ICD-10-CM

## 2020-02-12 NOTE — Telephone Encounter (Signed)
Spoke with patient. Patient declines apts in [facility]. Patient states that she has chronic fatigue. She is using imodium as needed for her diarrhea.  She has been cleaning and organizing her house. Patient contributes the fatigue to being overly active. She will contact our office if symptoms of fatigue become more severe.  Patient inquired about radiation apt/referral. Apt not yet made. Radiation apt obtained for July 13th at 11 am. Pt prefers to have her follow-up apt and labs with Dr. Rogue Bussing on the same day. Apts provided for patient. Pt thanked me for speaking to her.

## 2020-02-12 NOTE — Telephone Encounter (Signed)
Sounds good. Thanks 

## 2020-02-12 NOTE — Progress Notes (Deleted)
[name], [name], [name]  #  Oncology History Overview Note  # DEC 2020- LEFT BREAST-TWO PRIM ARIES:   #A]-IMC; ER/PR >90%; Her 2-IHC-2+; FISH-NEG-2 ; MRI- 3.7 CM; s/p s/p lumpectomy; s/p reexcision-final margins negative.  Stage Ib.    #B] Bx- IMC; G-1; ER/PR-pos; her2 NEG. s/p lumpectomy; margins negative [as per Dr.Wakefield; discussed at [facility] tumor conference]  # MARCH 31st 2021- START Taxotere [60 mg/m]; Cytoxan; growth factor  #Extreme anxiety/chronic diarrhea/chronic severe hypokalemia/history of migraines/depression  Used estrogen and progesterone therapy: on premarin for post- menopausal x 2-3 years- discontinued in JAN 2021.   # SURVIVORSHIP:   DIAGNOSIS: Breast cancer  STAGE: IB & I     ;  GOALS: Cure  CURRENT/MOST RECENT THERAPY : TC    Carcinoma of upper-outer quadrant of left breast in female, estrogen receptor positive ([place])  08/14/2019 Initial Diagnosis   Carcinoma of upper-outer quadrant of left breast in female, estrogen receptor positive ([place])   09/10/2019 Genetic Testing   Negative genetic testing on the STAT and common hereditary cancer panel.  The Common Hereditary Gene Panel offered by [facility] includes sequencing and/or deletion duplication testing of the following 48 genes: APC, ATM, AXIN2, BARD1, BMPR1A, BRCA1, BRCA2, BRIP1, CDH1, CDK4, CDKN2A (p14ARF), CDKN2A (p16INK4a), CHEK2, CTNNA1, DICER1, EPCAM (Deletion/duplication testing only), GREM1 (promoter region deletion/duplication testing only), KIT, MEN1, MLH1, MSH2, MSH3, MSH6, MUTYH, NBN, NF1, NHTL1, PALB2, PDGFRA, PMS2, POLD1, POLE, PTEN, RAD50, RAD51C, RAD51D, RNF43, SDHB, SDHC, SDHD, SMAD4, SMARCA4. STK11, TP53, TSC1, TSC2, and VHL.  The following genes were evaluated for sequence changes only: SDHA and HOXB13  c.251G>A variant only. The report date is September 10, 2019.   10/03/2019 Cancer Staging   Staging form: Breast, AJCC 8th Edition - Pathologic stage from 10/03/2019: Stage IB (pT2, pN1a, cM0, G2, ER+, PR+, HER2-) - Signed by Gardenia Phlegm, NP on 10/16/2019   10/09/2019 - 10/09/2019 Chemotherapy   The patient had DOXOrubicin (ADRIAMYCIN) chemo injection 98 mg, 60 mg/m2, Intravenous,  Once, 0 of 4 cycles PALONOSETRON HCL INJECTION 0.25 MG/5ML, 0.25 mg, Intravenous,  Once, 0 of 4 cycles pegfilgrastim-jmdb (FULPHILA) injection 6 mg, 6 mg, Subcutaneous,  Once, 0 of 4 cycles cyclophosphamide (CYTOXAN) 980 mg in sodium chloride 0.9 % 250 mL chemo infusion, 600 mg/m2, Intravenous,  Once, 0 of 4 cycles PACLitaxel (TAXOL) 132 mg in sodium chloride 0.9 % 250 mL chemo infusion (</= 38m/m2), 80 mg/m2, Intravenous,  Once, 0 of 12 cycles FOSAPREPITANT IV INFUSION 150 MG, 150 mg, Intravenous,  Once, 0 of 4 cycles  for chemotherapy treatment.    11/13/2019 -  Chemotherapy   The patient had palonosetron (ALOXI) injection 0.25 mg, 0.25 mg, Intravenous,  Once, 4 of 4 cycles Administration: 0.25 mg (11/13/2019), 0.25 mg (12/04/2019), 0.25 mg (12/25/2019), 0.25 mg (01/15/2020) pegfilgrastim-jmdb (FULPHILA) injection 6 mg, 6 mg, Subcutaneous,  Once, 4 of 4 cycles Administration: 6 mg (11/14/2019), 6 mg (12/05/2019), 6 mg (12/26/2019), 6 mg (01/16/2020) cyclophosphamide (CYTOXAN) 1,000 mg in sodium chloride 0.9 % 250 mL chemo infusion, 980 mg, Intravenous,  Once, 4 of 4 cycles Administration: 1,000 mg (11/13/2019), 1,000 mg (12/04/2019), 1,000 mg (12/25/2019), 1,000 mg (01/15/2020) DOCEtaxel (TAXOTERE) 100 mg in sodium chloride 0.9 % 250 mL chemo infusion, 60 mg/m2 = 100 mg (100 % of original dose 60 mg/m2), Intravenous,  Once, 4 of 4  cycles Dose modification: 60 mg/m2 (original dose 60 mg/m2, Cycle 1, Reason: Provider Judgment) Administration: 100 mg (11/13/2019), 100 mg (12/04/2019), 100 mg (12/25/2019), 100 mg (01/15/2020)  for  chemotherapy treatment.       HISTORY OF PRESENTING ILLNESS:  Jessica Barnes 59 y.o.  female with of breast cancer  [T2N1-ER/PR positive HER-2 negative; T1N1-ER/PR positive HER-2 negative 2 separate primaries] on Cytoxan Taxotere is here for follow-up.  Patient is currently status post 3 cycles of chemotherapy.  Patient continues to follow-up with [place] in the management clinic approximately week post chemotherapy for IV fluids.  Denies any swelling in the legs.  Denies any diarrhea.  States her headaches have significantly improved since being on chemotherapy.  Complains of mild to moderate fatigue.    Review of Systems  Constitutional: Positive for malaise/fatigue. Negative for chills, diaphoresis, fever and weight loss.  HENT: Negative for nosebleeds and sore throat.   Eyes: Negative for double vision.  Respiratory: Negative for cough, hemoptysis, sputum production and wheezing.   Cardiovascular: Negative for chest pain, palpitations, orthopnea and leg swelling.  Gastrointestinal: Negative for abdominal pain, blood in stool, constipation, heartburn, melena, nausea and vomiting.  Genitourinary: Negative for dysuria, frequency and urgency.  Musculoskeletal: Negative for back pain and joint pain.  Skin: Negative.  Negative for itching and rash.  Neurological: Positive for headaches. Negative for dizziness, tingling, focal weakness and weakness.  Endo/Heme/Allergies: Does not bruise/bleed easily.  Psychiatric/Behavioral: Positive for depression. The patient is nervous/anxious and has insomnia.      MEDICAL HISTORY:  Past Medical History:  Diagnosis Date  . Anxiety 10/13/96  . Chest pain    atypical, hosp for that and depression 8/10-8/11/08  . COPD (chronic obstructive pulmonary disease) ([place]) 09/15/05   by x-ray  . Depression 10/13/96   hospital 8/10-8/11/08.  notable history: son with TBI after MVA  . Family history of breast cancer   . Family history of colon cancer   .  Family history of pancreatic cancer   . Hyperlipidemia 09/15/98  . Hypothyroidism 01/14/96  . Migraine    27 yoa  . NSVD (normal spontaneous vaginal delivery)    x 1  . PONV (postoperative nausea and vomiting)   . PTSD (post-traumatic stress disorder)    likely PTSD after son's MVA/TBI in 2007, s/p counseling    SURGICAL HISTORY: Past Surgical History:  Procedure Laterality Date  . APPENDECTOMY     59 years of age  . BREAST BIOPSY Left 08/06/2019   [place] bx/ ribbon clip/ path pending  . BREAST BIOPSY Left 09/06/2019   [place] path pending heart clip  . BREAST LUMPECTOMY WITH RADIOACTIVE SEED AND SENTINEL LYMPH NODE BIOPSY Left 10/01/2019   Procedure: LEFT BREAST LUMPECTOMY WITH BRACKETED RADIOACTIVE SEEDS AND LEFT AXILLARY SENTINEL LYMPH NODE BIOPSY;  Surgeon: Rolm Bookbinder, MD;  Location: [facility];  Service: General;  Laterality: Left;  . PORTACATH PLACEMENT Right 10/28/2019   Procedure: INSERTION PORT-A-CATH WITH ULTRASOUND GUIDANCE;  Surgeon: Rolm Bookbinder, MD;  Location: [facility];  Service: General;  Laterality: Right;  . RE-EXCISION OF BREAST LUMPECTOMY Left 10/28/2019   Procedure: RE-EXCISION OF LEFT BREAST LUMPECTOMY;  Surgeon: Rolm Bookbinder, MD;  Location: [facility];  Service: General;  Laterality: Left;    SOCIAL HISTORY: Social History   Socioeconomic History  . Marital status: Married    Spouse name: Not on file  . Number of children: Not on file  . Years of education: Not on file  .  Highest education level: Not on file  Occupational History  . Not on file  Tobacco Use  . Smoking status: Former Smoker    Packs/day: 0.50    Years: 8.00    Pack years: 4.00    Types: Cigarettes  . Smokeless tobacco: Never Used  . Tobacco comment: quit 04/15/2014  Vaping Use  . Vaping Use: Every day  Substance and Sexual Activity  . Alcohol use: No    Alcohol/week: 0.0 standard drinks  . Drug use: No  . Sexual activity: Not  on file  Other Topics Concern  . Not on file  Social History Narrative   Remarried, lives with husband 1 son, he had TBI and short term memory loss after prolonged illness      #Patient lives in [place] with her husband; prior history of smoking/currently vapes.  No significant alcohol use.  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    Social Determinants of Health   Financial Resource Strain:   . Difficulty of Paying Living Expenses:   Food Insecurity:   . Worried About Charity fundraiser in the Last Year:   . Arboriculturist in the Last Year:   Transportation Needs:   . Film/video editor (Medical):   Marland Kitchen Lack of Transportation (Non-Medical):   Physical Activity:   . Days of Exercise per Week:   . Minutes of Exercise per Session:   Stress:   . Feeling of Stress :   Social Connections:   . Frequency of Communication with Friends and Family:   . Frequency of Social Gatherings with Friends and Family:   . Attends Religious Services:   . Active Member of Clubs or Organizations:   . Attends Archivist Meetings:   Marland Kitchen Marital Status:   Intimate Partner Violence:   . Fear of Current or Ex-Partner:   . Emotionally Abused:   Marland Kitchen Physically Abused:   . Sexually Abused:     FAMILY HISTORY: Family History  Problem Relation Age of Onset  . Hypertension Mother   . Colon cancer Father        d. 41  . Thyroid disease Sister   . Breast cancer Paternal Aunt   . Breast cancer Paternal Grandmother   . Colon cancer Paternal Grandmother   . Breast cancer Cousin        paternal aunt's daughter  . Colon cancer Paternal Uncle        3 pat uncles with colon cancer at unknown ages  . Pancreatic cancer Maternal Uncle        d. 74  . Breast cancer Paternal Aunt     ALLERGIES:  is allergic to sulfamethoxazole-trimethoprim and codeine.  MEDICATIONS:  Current Outpatient Medications  Medication Sig Dispense Refill  . acetaminophen (TYLENOL) 500 MG tablet Take 1,000 mg by mouth  every 6 (six) hours as needed for mild pain or headache.    . butalbital-acetaminophen-caffeine (FIORICET) 50-325-40 MG tablet TAKE 1 TABLET BY MOUTH EVERY 4 TO 6 HOURS AS NEEDED FOR HEADACHE 90 tablet 3  . dexamethasone (DECADRON) 4 MG tablet 1 tablet AM & PM; Take Day prior and day after chemo. 30 tablet 0  . diazepam (VALIUM) 10 MG tablet Take 1 tablet (10 mg total) by mouth at bedtime as needed. for sleep 30 tablet 0  . dimenhyDRINATE (DRAMAMINE) 50 MG tablet Take 50 mg by mouth every 8 (eight) hours as needed.    . diphenoxylate-atropine (LOMOTIL) 2.5-0.025 MG tablet Take 1 tablet  by mouth 4 (four) times daily as needed for diarrhea or loose stools. Take it along with immodium (Patient not taking: Reported on 12/11/2019) 30 tablet 0  . FLUoxetine (PROZAC) 20 MG capsule TAKE 3 CAPSULES BY MOUTH DAILY 270 capsule 1  . FLUoxetine (PROZAC) 20 MG tablet Take 60 mg by mouth daily.    . hydrOXYzine (ATARAX/VISTARIL) 25 MG tablet Take 1 tablet (25 mg total) by mouth 3 (three) times daily as needed. 30 tablet 0  . ibuprofen (ADVIL) 100 MG tablet Take 100 mg by mouth every 6 (six) hours as needed for fever.    . levothyroxine (SYNTHROID) 88 MCG tablet TAKE 1 TABLET BY MOUTH DAILY EXCEPT ON SUNDAYS TAKE 1/2 TABLET 90 tablet 1  . lidocaine-prilocaine (EMLA) cream Apply 1 application topically as needed. 30 g 0  . loperamide (IMODIUM A-D) 2 MG tablet Take 4 mg by mouth 4 (four) times daily as needed for diarrhea or loose stools.    . ondansetron (ZOFRAN) 8 MG tablet One pill every 8 hours as needed for nausea/vomitting. (Patient not taking: Reported on 12/24/2019) 40 tablet 1  . oxyCODONE (OXY IR/ROXICODONE) 5 MG immediate release tablet Take 1 tablet (5 mg total) by mouth every 6 (six) hours as needed for moderate pain, severe pain or breakthrough pain. (Patient not taking: Reported on 12/24/2019) 10 tablet 0  . potassium chloride SA (KLOR-CON) 20 MEQ tablet 1 pill twice a day 60 tablet 3  . predniSONE  (STERAPRED UNI-PAK 21 TAB) 10 MG (21) TBPK tablet Take as directed. 21 tablet 0  . prochlorperazine (COMPAZINE) 10 MG tablet Take 1 tablet (10 mg total) by mouth every 6 (six) hours as needed for nausea or vomiting. (Patient not taking: Reported on 12/24/2019) 40 tablet 1  . triamcinolone (KENALOG) 0.025 % ointment Apply 1 application topically 3 (three) times daily. 80 g 0   No current facility-administered medications for this visit.      Marland Kitchen  PHYSICAL EXAMINATION: ECOG PERFORMANCE STATUS: 0 - Asymptomatic  There were no vitals filed for this visit. There were no vitals filed for this visit.  Physical Exam Constitutional:      Comments: Accompanied by husband.  HENT:     Head: Normocephalic and atraumatic.     Mouth/Throat:     Pharynx: No oropharyngeal exudate.  Eyes:     Pupils: Pupils are equal, round, and reactive to light.  Cardiovascular:     Rate and Rhythm: Normal rate and regular rhythm.  Pulmonary:     Effort: Pulmonary effort is normal. No respiratory distress.     Breath sounds: Normal breath sounds. No wheezing.  Abdominal:     General: Bowel sounds are normal. There is no distension.     Palpations: Abdomen is soft. There is no mass.     Tenderness: There is no abdominal tenderness. There is no guarding or rebound.  Musculoskeletal:        General: No tenderness. Normal range of motion.     Cervical back: Normal range of motion and neck supple.  Skin:    General: Skin is warm.  Neurological:     Mental Status: She is alert and oriented to person, place, and time.  Psychiatric:        Mood and Affect: Affect normal.     Comments: Anxious    LABORATORY DATA:  I have reviewed the data as listed Lab Results  Component Value Date   WBC 9.4 01/15/2020   HGB 10.5 (L) 01/15/2020  HCT 32.5 (L) 01/15/2020   MCV 92.6 01/15/2020   PLT 418 (H) 01/15/2020   Recent Labs    08/14/19 1544 09/06/19 1054 10/10/19 1325 11/06/19 0950 12/04/19 0837 12/04/19 0837  12/11/19 0838 12/25/19 0857 01/15/20 0846  NA  --   --   --    < > 137   < > 138 136 137  K  --   --   --    < > 4.3   < > 3.7 4.0 3.8  CL  --   --   --    < > 107   < > 105 107 105  CO2  --   --   --    < > 21*   < > 23 19* 20*  GLUCOSE  --   --   --    < > 169*   < > 107* 174* 198*  BUN  --   --   --    < > 15   < > _0 CREATININE  --   --   --    < > 0.73   < > 0.75 0.75 0.76  CALCIUM   < >  --  8.6*   < > 9.2   < > 8.7* 9.4 9.4  GFRNONAA  --   --   --    < > >60   < > >60 >60 >60  GFRAA  --   --   --    < > >60   < > >60 >60 >60  PROT  --  7.6 6.5   < > 7.7  --   --  7.8 7.5  ALBUMIN  --  3.8 4.0   < > 4.2  --   --  4.2 4.2  AST   < > 27 23   < > 30  --   --  21 27  ALT   < > 18 21   < > 50*  --   --  21 28  ALKPHOS   < > 174* 200*   < > 177*  --   --  123 105  BILITOT  --  0.4 <0.2   < > 0.4  --   --  0.5 0.7  BILIDIR  --  <0.1 0.07  --   --   --   --   --   --   IBILI  --  NOT CALCULATED  --   --   --   --   --   --   --    < > = values in this interval not displayed.    RADIOGRAPHIC STUDIES: I have personally reviewed the radiological images as listed and agreed with the findings in the report. No results found.  ASSESSMENT & PLAN:   No problem-specific Assessment & Plan notes found for this encounter.  All questions were answered. The patient/family knows to call the clinic with any problems, questions or concerns.    Cammie Sickle, MD 02/12/2020 7:47 AM

## 2020-02-12 NOTE — Telephone Encounter (Signed)
I spoke with patient's husband Shanon Brow. Patient has increased fatigue and intermittent diarrhea. Pt refusing apt today per husband. Attempted to get the patient in with symptom management this week given the above symptoms. Shanon Brow asked me to call Jenny Reichmann personally to discuss. Dr. Rogue Bussing is not able to see patient until next week. I left a vm for Cindy to discuss her apts.  Colette- Please attempt to reach out to patient as well- to see if she would be willing to see [facility] tomorrow or Friday.  Sonia Baller- patient is not currently on any active treatment.

## 2020-02-12 NOTE — Telephone Encounter (Signed)
Pt husband called to cancel appt today. Pt is feeing very bad. He wanted to reschedule to Thursday or Friday, but Dr.B schedule is full. Please call patient to reschedule appt. Appt has been canceled.

## 2020-02-12 NOTE — Assessment & Plan Note (Deleted)
#  Left breast cancer-2 separate primaries- A] T2N1-ER/PR positive HER-2 negative;  B] T1N1 ER/PR positive HER-2 negative. On adjuvant chemotherapy Taxotere [60 mg/m] Cytoxan q. every 3 weeks.  # proceed with cycle #4 Labs today reviewed;  Labs today reviewed;  acceptable for treatment today.  We will proceed with radiation oncology referral.  Discussed the lack of benefit of surveillance imaging given her overwhelming chance of being cured of this cancer.  # Diarrhea- improved; monitor closely.  #Chronic hypokalemia/diarrhea-potassium 4.0 today.  Stable potassium supplementation;   #Anxiety-continue ativan prior to chemotherapy.  Stable  #Elevated LFTs- alkaline phosphatase-123; improved- STABLE.   # Fatigue- G-2; from chemo; monitor closely.   # Insomnia/likely secondary to anxiety/dex.  Worse.  Recommend continue melatonin to her Valium.new script for valium given.   # DISPOSITION:  # referral to radiation oncology- re: breast cancer # Chemo today; # D-2 fulphila # Follow up on 8th [facility]- labs- cbc/bmp; possible IVfs over 1 hour # in 4 weeks MD; labs- cbc/cmp; NO chemo-Dr.B

## 2020-02-17 ENCOUNTER — Other Ambulatory Visit: Payer: Self-pay | Admitting: Family Medicine

## 2020-02-17 ENCOUNTER — Other Ambulatory Visit: Payer: Self-pay | Admitting: Internal Medicine

## 2020-02-17 DIAGNOSIS — E039 Hypothyroidism, unspecified: Secondary | ICD-10-CM

## 2020-02-17 NOTE — Telephone Encounter (Signed)
   Ref Range & Units 1 mo ago  Potassium 3.5 - 5.1 mmol/L 3.8

## 2020-02-18 NOTE — Telephone Encounter (Signed)
Electronic refill request. Levothyroxine Last office visit:   07/29/2019 Last Filled:    90 tablet 1 08/05/2019  Last TSH:  April 2019  Please advise.

## 2020-02-19 NOTE — Telephone Encounter (Signed)
Sent.  Needs a f/u TSH at nonfasting lab visit.  Ordered. Thanks.

## 2020-02-19 NOTE — Telephone Encounter (Signed)
Please call patient and schedule lab appointment as instructed. 

## 2020-02-25 ENCOUNTER — Ambulatory Visit
Admission: RE | Admit: 2020-02-25 | Discharge: 2020-02-25 | Disposition: A | Discharge status: Home / Self Care | Payer: No Typology Code available for payment source | Source: Ambulatory Visit | Attending: Radiation Oncology | Admitting: Radiation Oncology

## 2020-02-25 ENCOUNTER — Encounter: Payer: Self-pay | Admitting: Internal Medicine

## 2020-02-25 ENCOUNTER — Other Ambulatory Visit: Payer: Self-pay

## 2020-02-25 ENCOUNTER — Other Ambulatory Visit: Payer: Self-pay | Admitting: *Deleted

## 2020-02-25 ENCOUNTER — Inpatient Hospital Stay: Payer: No Typology Code available for payment source | Attending: Internal Medicine

## 2020-02-25 ENCOUNTER — Inpatient Hospital Stay (HOSPITAL_BASED_OUTPATIENT_CLINIC_OR_DEPARTMENT_OTHER): Payer: No Typology Code available for payment source | Admitting: Internal Medicine

## 2020-02-25 DIAGNOSIS — G4700 Insomnia, unspecified: Secondary | ICD-10-CM | POA: Diagnosis not present

## 2020-02-25 DIAGNOSIS — Z452 Encounter for adjustment and management of vascular access device: Secondary | ICD-10-CM | POA: Insufficient documentation

## 2020-02-25 DIAGNOSIS — F329 Major depressive disorder, single episode, unspecified: Secondary | ICD-10-CM | POA: Diagnosis not present

## 2020-02-25 DIAGNOSIS — Z170 Estrogen receptor positive status [ER+]: Secondary | ICD-10-CM | POA: Insufficient documentation

## 2020-02-25 DIAGNOSIS — R197 Diarrhea, unspecified: Secondary | ICD-10-CM | POA: Diagnosis not present

## 2020-02-25 DIAGNOSIS — F419 Anxiety disorder, unspecified: Secondary | ICD-10-CM | POA: Insufficient documentation

## 2020-02-25 DIAGNOSIS — Z95828 Presence of other vascular implants and grafts: Secondary | ICD-10-CM

## 2020-02-25 DIAGNOSIS — C50412 Malignant neoplasm of upper-outer quadrant of left female breast: Secondary | ICD-10-CM | POA: Diagnosis present

## 2020-02-25 DIAGNOSIS — E039 Hypothyroidism, unspecified: Secondary | ICD-10-CM | POA: Insufficient documentation

## 2020-02-25 DIAGNOSIS — J449 Chronic obstructive pulmonary disease, unspecified: Secondary | ICD-10-CM | POA: Insufficient documentation

## 2020-02-25 DIAGNOSIS — Z800 Family history of malignant neoplasm of digestive organs: Secondary | ICD-10-CM | POA: Insufficient documentation

## 2020-02-25 DIAGNOSIS — F4310 Post-traumatic stress disorder, unspecified: Secondary | ICD-10-CM | POA: Diagnosis not present

## 2020-02-25 DIAGNOSIS — Z9221 Personal history of antineoplastic chemotherapy: Secondary | ICD-10-CM | POA: Diagnosis not present

## 2020-02-25 DIAGNOSIS — F418 Other specified anxiety disorders: Secondary | ICD-10-CM | POA: Diagnosis not present

## 2020-02-25 DIAGNOSIS — Z803 Family history of malignant neoplasm of breast: Secondary | ICD-10-CM | POA: Diagnosis not present

## 2020-02-25 DIAGNOSIS — E876 Hypokalemia: Secondary | ICD-10-CM | POA: Insufficient documentation

## 2020-02-25 DIAGNOSIS — Z79899 Other long term (current) drug therapy: Secondary | ICD-10-CM | POA: Insufficient documentation

## 2020-02-25 DIAGNOSIS — Z87891 Personal history of nicotine dependence: Secondary | ICD-10-CM | POA: Insufficient documentation

## 2020-02-25 DIAGNOSIS — R7989 Other specified abnormal findings of blood chemistry: Secondary | ICD-10-CM | POA: Insufficient documentation

## 2020-02-25 DIAGNOSIS — E785 Hyperlipidemia, unspecified: Secondary | ICD-10-CM | POA: Diagnosis not present

## 2020-02-25 LAB — COMPREHENSIVE METABOLIC PANEL
ALT: 14 U/L (ref 0–44)
AST: 17 U/L (ref 15–41)
Albumin: 3.9 g/dL (ref 3.5–5.0)
Alkaline Phosphatase: 118 U/L (ref 38–126)
Anion gap: 7 (ref 5–15)
BUN: 13 mg/dL (ref 6–20)
CO2: 23 mmol/L (ref 22–32)
Calcium: 8.7 mg/dL — ABNORMAL LOW (ref 8.9–10.3)
Chloride: 106 mmol/L (ref 98–111)
Creatinine, Ser: 0.87 mg/dL (ref 0.44–1.00)
GFR calc Af Amer: >60 mL/min (ref >60)
GFR calc non Af Amer: >60 mL/min (ref >60)
Glucose, Bld: 96 mg/dL (ref 70–99)
Potassium: 4 mmol/L (ref 3.5–5.1)
Sodium: 136 mmol/L (ref 135–145)
Total Bilirubin: 0.4 mg/dL (ref 0.3–1.2)
Total Protein: 6.8 g/dL (ref 6.5–8.1)

## 2020-02-25 LAB — CBC WITH DIFFERENTIAL/PLATELET
Abs Immature Granulocytes: 0.02 K/uL (ref 0.00–0.07)
Basophils Absolute: 0 K/uL (ref 0.0–0.1)
Basophils Relative: 0 %
Eosinophils Absolute: 0.6 K/uL — ABNORMAL HIGH (ref 0.0–0.5)
Eosinophils Relative: 10 %
HCT: 32.5 % — ABNORMAL LOW (ref 36.0–46.0)
Hemoglobin: 10.6 g/dL — ABNORMAL LOW (ref 12.0–15.0)
Immature Granulocytes: 0 %
Lymphocytes Relative: 29 %
Lymphs Abs: 1.7 K/uL (ref 0.7–4.0)
MCH: 29.9 pg (ref 26.0–34.0)
MCHC: 32.6 g/dL (ref 30.0–36.0)
MCV: 91.5 fL (ref 80.0–100.0)
Monocytes Absolute: 0.5 K/uL (ref 0.1–1.0)
Monocytes Relative: 9 %
Neutro Abs: 2.9 K/uL (ref 1.7–7.7)
Neutrophils Relative %: 52 %
Platelets: 270 K/uL (ref 150–400)
RBC: 3.55 MIL/uL — ABNORMAL LOW (ref 3.87–5.11)
RDW: 13.5 % (ref 11.5–15.5)
WBC: 5.7 K/uL (ref 4.0–10.5)
nRBC: 0 % (ref 0.0–0.2)

## 2020-02-25 MED ORDER — HEPARIN SOD (PORK) LOCK FLUSH 100 UNIT/ML IV SOLN
500.0000 [IU] | Freq: Once | INTRAVENOUS | Status: AC
  Administered 2020-02-25: 500 [IU] via INTRAVENOUS
  Filled 2020-02-25: qty 5

## 2020-02-25 MED ORDER — SODIUM CHLORIDE 0.9% FLUSH
10.0000 mL | INTRAVENOUS | Status: DC | PRN
  Administered 2020-02-25: 10 mL via INTRAVENOUS
  Filled 2020-02-25: qty 10

## 2020-02-25 NOTE — Progress Notes (Signed)
Patient denies pain for follow up today. States she noticed a lumpy bruise in anti cubital area of right arm a week or so ago.

## 2020-02-25 NOTE — Assessment & Plan Note (Addendum)
#  Left breast cancer-2 separate primaries- A] T2N1-ER/PR positive HER-2 negative;  B] T1N1 ER/PR positive HER-2 negative. S/p 4 cycles of adjuvant chemotherapy Taxotere [60 mg/m] Cytoxan.  #Discussed regarding proceeding with adjuvant radiation; awaiting evaluation with Dr. Donella Stade this morning.  Patient will need endocrine therapy post radiation.  # Diarrhea- improved; monitor closely.  #Chronic hypokalemia/diarrhea-potassium 4.0 today.  STABLE;recommend Kdur 20 meq/day.   #Anxiety-continue ativan prior to chemotherapy. STABLE.   #Elevated LFTs- alkaline phosphatase-123; improved- STABLE.   # Insomnia/likely secondary to anxiety- on valium improved.    # DISPOSITION:  #follow up in 2 months-MD; labs- cbc/cmp; port flush-Dr.B

## 2020-02-25 NOTE — Progress Notes (Signed)
[name], [name], [name]  #  Oncology History Overview Note  # DEC 2020- LEFT BREAST-TWO PRIM ARIES:   #A]-IMC; ER/PR >90%; Her 2-IHC-2+; FISH-NEG-2 ; MRI- 3.7 CM; s/p s/p lumpectomy; s/p reexcision-final margins negative.  Stage Ib.    #B] Bx- IMC; G-1; ER/PR-pos; her2 NEG. s/p lumpectomy; margins negative [as per Dr.Wakefield; discussed at [facility] tumor conference]  # MARCH 31st 2021- START Taxotere [60 mg/m]; Cytoxan; growth factor  #Extreme anxiety/chronic diarrhea/chronic severe hypokalemia/history of migraines/depression  Used estrogen and progesterone therapy: on premarin for post- menopausal x 2-3 years- discontinued in JAN 2021.   # SURVIVORSHIP:   DIAGNOSIS: Breast cancer  STAGE: IB & I     ;  GOALS: Cure  CURRENT/MOST RECENT THERAPY : TC    Carcinoma of upper-outer quadrant of left breast in female, estrogen receptor positive ([place])  08/14/2019 Initial Diagnosis   Carcinoma of upper-outer quadrant of left breast in female, estrogen receptor positive ([place])   09/10/2019 Genetic Testing   Negative genetic testing on the STAT and common hereditary cancer panel.  The Common Hereditary Gene Panel offered by [facility] includes sequencing and/or deletion duplication testing of the following 48 genes: APC, ATM, AXIN2, BARD1, BMPR1A, BRCA1, BRCA2, BRIP1, CDH1, CDK4, CDKN2A (p14ARF), CDKN2A (p16INK4a), CHEK2, CTNNA1, DICER1, EPCAM (Deletion/duplication testing only), GREM1 (promoter region deletion/duplication testing only), KIT, MEN1, MLH1, MSH2, MSH3, MSH6, MUTYH, NBN, NF1, NHTL1, PALB2, PDGFRA, PMS2, POLD1, POLE, PTEN, RAD50, RAD51C, RAD51D, RNF43, SDHB, SDHC, SDHD, SMAD4, SMARCA4. STK11, TP53, TSC1, TSC2, and VHL.  The following genes were evaluated for sequence changes only: SDHA and HOXB13  c.251G>A variant only. The report date is September 10, 2019.   10/03/2019 Cancer Staging   Staging form: Breast, AJCC 8th Edition - Pathologic stage from 10/03/2019: Stage IB (pT2, pN1a, cM0, G2, ER+, PR+, HER2-) - Signed by Gardenia Phlegm, NP on 10/16/2019   10/09/2019 - 10/09/2019 Chemotherapy   The patient had DOXOrubicin (ADRIAMYCIN) chemo injection 98 mg, 60 mg/m2, Intravenous,  Once, 0 of 4 cycles PALONOSETRON HCL INJECTION 0.25 MG/5ML, 0.25 mg, Intravenous,  Once, 0 of 4 cycles pegfilgrastim-jmdb (FULPHILA) injection 6 mg, 6 mg, Subcutaneous,  Once, 0 of 4 cycles cyclophosphamide (CYTOXAN) 980 mg in sodium chloride 0.9 % 250 mL chemo infusion, 600 mg/m2, Intravenous,  Once, 0 of 4 cycles PACLitaxel (TAXOL) 132 mg in sodium chloride 0.9 % 250 mL chemo infusion (</= 38m/m2), 80 mg/m2, Intravenous,  Once, 0 of 12 cycles FOSAPREPITANT IV INFUSION 150 MG, 150 mg, Intravenous,  Once, 0 of 4 cycles  for chemotherapy treatment.    11/13/2019 -  Chemotherapy   The patient had palonosetron (ALOXI) injection 0.25 mg, 0.25 mg, Intravenous,  Once, 4 of 4 cycles Administration: 0.25 mg (11/13/2019), 0.25 mg (12/04/2019), 0.25 mg (12/25/2019), 0.25 mg (01/15/2020) pegfilgrastim-jmdb (FULPHILA) injection 6 mg, 6 mg, Subcutaneous,  Once, 4 of 4 cycles Administration: 6 mg (11/14/2019), 6 mg (12/05/2019), 6 mg (12/26/2019), 6 mg (01/16/2020) cyclophosphamide (CYTOXAN) 1,000 mg in sodium chloride 0.9 % 250 mL chemo infusion, 980 mg, Intravenous,  Once, 4 of 4 cycles Administration: 1,000 mg (11/13/2019), 1,000 mg (12/04/2019), 1,000 mg (12/25/2019), 1,000 mg (01/15/2020) DOCEtaxel (TAXOTERE) 100 mg in sodium chloride 0.9 % 250 mL chemo infusion, 60 mg/m2 = 100 mg (100 % of original dose 60 mg/m2), Intravenous,  Once, 4 of 4  cycles Dose modification: 60 mg/m2 (original dose 60 mg/m2, Cycle 1, Reason: Provider Judgment) Administration: 100 mg (11/13/2019), 100 mg (12/04/2019), 100 mg (12/25/2019), 100 mg (01/15/2020)  for  chemotherapy treatment.       HISTORY OF PRESENTING ILLNESS:  Jessica Barnes 59 y.o.  female with of breast cancer  [T2N1-ER/PR positive HER-2 negative; T1N1-ER/PR positive HER-2 negative 2 separate primaries] on Cytoxan Taxotere is here for follow-up.  Patient is currently status post 4 cycles of adjuvant chemotherapy.  Patient overall doing well.  Diarrhea chronic stable.  No nausea vomiting.  No tingling or numbness.  Chronic mild fatigue.  Not any worse.   Review of Systems  Constitutional: Positive for malaise/fatigue. Negative for chills, diaphoresis, fever and weight loss.  HENT: Negative for nosebleeds and sore throat.   Eyes: Negative for double vision.  Respiratory: Negative for cough, hemoptysis, sputum production and wheezing.   Cardiovascular: Negative for chest pain, palpitations, orthopnea and leg swelling.  Gastrointestinal: Negative for abdominal pain, blood in stool, constipation, heartburn, melena, nausea and vomiting.  Genitourinary: Negative for dysuria, frequency and urgency.  Musculoskeletal: Negative for back pain and joint pain.  Skin: Negative.  Negative for itching and rash.  Neurological: Positive for headaches. Negative for dizziness, tingling, focal weakness and weakness.  Endo/Heme/Allergies: Does not bruise/bleed easily.  Psychiatric/Behavioral: Positive for depression. The patient is nervous/anxious and has insomnia.      MEDICAL HISTORY:  Past Medical History:  Diagnosis Date  . Anxiety 10/13/96  . Chest pain    atypical, hosp for that and depression 8/10-8/11/08  . COPD (chronic obstructive pulmonary disease) ([place]) 09/15/05   by x-ray  . Depression 10/13/96   hospital 8/10-8/11/08.  notable history: son with TBI after MVA  . Family history of breast cancer   . Family history of colon cancer   . Family history of pancreatic cancer   . Hyperlipidemia 09/15/98  . Hypothyroidism 01/14/96  . Migraine    27 yoa  . NSVD (normal spontaneous  vaginal delivery)    x 1  . PONV (postoperative nausea and vomiting)   . PTSD (post-traumatic stress disorder)    likely PTSD after son's MVA/TBI in 2007, s/p counseling    SURGICAL HISTORY: Past Surgical History:  Procedure Laterality Date  . APPENDECTOMY     60 years of age  . BREAST BIOPSY Left 08/06/2019   [place] bx/ ribbon clip/ path pending  . BREAST BIOPSY Left 09/06/2019   [place] path pending heart clip  . BREAST LUMPECTOMY WITH RADIOACTIVE SEED AND SENTINEL LYMPH NODE BIOPSY Left 10/01/2019   Procedure: LEFT BREAST LUMPECTOMY WITH BRACKETED RADIOACTIVE SEEDS AND LEFT AXILLARY SENTINEL LYMPH NODE BIOPSY;  Surgeon: Rolm Bookbinder, MD;  Location: [facility];  Service: General;  Laterality: Left;  . PORTACATH PLACEMENT Right 10/28/2019   Procedure: INSERTION PORT-A-CATH WITH ULTRASOUND GUIDANCE;  Surgeon: Rolm Bookbinder, MD;  Location: [facility];  Service: General;  Laterality: Right;  . RE-EXCISION OF BREAST LUMPECTOMY Left 10/28/2019   Procedure: RE-EXCISION OF LEFT BREAST LUMPECTOMY;  Surgeon: Rolm Bookbinder, MD;  Location: [facility];  Service: General;  Laterality: Left;    SOCIAL HISTORY: Social History   Socioeconomic History  . Marital status: Married    Spouse name: Not on file  . Number of children: Not on file  . Years of education: Not on file  . Highest education level: Not on file  Occupational History  . Not on file  Tobacco Use  .  Smoking status: Former Smoker    Packs/day: 0.50    Years: 8.00    Pack years: 4.00    Types: Cigarettes  . Smokeless tobacco: Never Used  . Tobacco comment: quit 04/15/2014  Vaping Use  . Vaping Use: Every day  Substance and Sexual Activity  . Alcohol use: No    Alcohol/week: 0.0 standard drinks  . Drug use: No  . Sexual activity: Not on file  Other Topics Concern  . Not on file  Social History Narrative   Remarried, lives with husband 1 son, he had TBI and short term  memory loss after prolonged illness      #Patient lives in [place] with her husband; prior history of smoking/currently vapes.  No significant alcohol use.  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    Social Determinants of Health   Financial Resource Strain:   . Difficulty of Paying Living Expenses:   Food Insecurity:   . Worried About Programme researcher, broadcasting/film/video in the Last Year:   . Barista in the Last Year:   Transportation Needs:   . Freight forwarder (Medical):   Marland Kitchen Lack of Transportation (Non-Medical):   Physical Activity:   . Days of Exercise per Week:   . Minutes of Exercise per Session:   Stress:   . Feeling of Stress :   Social Connections:   . Frequency of Communication with Friends and Family:   . Frequency of Social Gatherings with Friends and Family:   . Attends Religious Services:   . Active Member of Clubs or Organizations:   . Attends Banker Meetings:   Marland Kitchen Marital Status:   Intimate Partner Violence:   . Fear of Current or Ex-Partner:   . Emotionally Abused:   Marland Kitchen Physically Abused:   . Sexually Abused:     FAMILY HISTORY: Family History  Problem Relation Age of Onset  . Hypertension Mother   . Colon cancer Father        d. 28  . Thyroid disease Sister   . Breast cancer Paternal Aunt   . Breast cancer Paternal Grandmother   . Colon cancer Paternal Grandmother   . Breast cancer Cousin        paternal aunt's daughter  . Colon cancer Paternal Uncle        3 pat uncles with colon cancer at unknown ages  . Pancreatic cancer Maternal Uncle        d. 1  . Breast cancer Paternal Aunt     ALLERGIES:  is allergic to sulfamethoxazole-trimethoprim and codeine.  MEDICATIONS:  Current Outpatient Medications  Medication Sig Dispense Refill  . acetaminophen (TYLENOL) 500 MG tablet Take 1,000 mg by mouth every 6 (six) hours as needed for mild pain or headache.    . butalbital-acetaminophen-caffeine (FIORICET) 50-325-40 MG tablet TAKE 1  TABLET BY MOUTH EVERY 4 TO 6 HOURS AS NEEDED FOR HEADACHE 90 tablet 3  . dexamethasone (DECADRON) 4 MG tablet 1 tablet AM & PM; Take Day prior and day after chemo. 30 tablet 0  . diazepam (VALIUM) 10 MG tablet Take 1 tablet (10 mg total) by mouth at bedtime as needed. for sleep 30 tablet 0  . dimenhyDRINATE (DRAMAMINE) 50 MG tablet Take 50 mg by mouth every 8 (eight) hours as needed.    . diphenoxylate-atropine (LOMOTIL) 2.5-0.025 MG tablet Take 1 tablet by mouth 4 (four) times daily as needed for diarrhea or loose stools. Take it along with immodium 30  tablet 0  . FLUoxetine (PROZAC) 20 MG capsule TAKE 3 CAPSULES BY MOUTH DAILY 270 capsule 1  . FLUoxetine (PROZAC) 20 MG tablet Take 60 mg by mouth daily.    . hydrOXYzine (ATARAX/VISTARIL) 25 MG tablet Take 1 tablet (25 mg total) by mouth 3 (three) times daily as needed. 30 tablet 0  . ibuprofen (ADVIL) 100 MG tablet Take 100 mg by mouth every 6 (six) hours as needed for fever.    . levothyroxine (SYNTHROID) 88 MCG tablet TAKE 1 TABLET BY MOUTH DAILY EXCEPT ON SUNDAYS TAKE 1/2 TABLET 90 tablet 0  . lidocaine-prilocaine (EMLA) cream Apply 1 application topically as needed. 30 g 0  . loperamide (IMODIUM A-D) 2 MG tablet Take 4 mg by mouth 4 (four) times daily as needed for diarrhea or loose stools.    . ondansetron (ZOFRAN) 8 MG tablet One pill every 8 hours as needed for nausea/vomitting. 40 tablet 1  . oxyCODONE (OXY IR/ROXICODONE) 5 MG immediate release tablet Take 1 tablet (5 mg total) by mouth every 6 (six) hours as needed for moderate pain, severe pain or breakthrough pain. 10 tablet 0  . potassium chloride SA (KLOR-CON) 20 MEQ tablet TAKE 1 TABLET BY MOUTH TWICE A DAY 60 tablet 3  . predniSONE (STERAPRED UNI-PAK 21 TAB) 10 MG (21) TBPK tablet Take as directed. 21 tablet 0  . prochlorperazine (COMPAZINE) 10 MG tablet Take 1 tablet (10 mg total) by mouth every 6 (six) hours as needed for nausea or vomiting. 40 tablet 1  . triamcinolone (KENALOG)  0.025 % ointment Apply 1 application topically 3 (three) times daily. 80 g 0   No current facility-administered medications for this visit.   Facility-Administered Medications Ordered in Other Visits  Medication Dose Route Frequency Provider Last Rate Last Admin  . heparin lock flush 100 unit/mL  500 Units Intravenous Once Charlaine Dalton R, MD      . sodium chloride flush (NS) 0.9 % injection 10 mL  10 mL Intravenous PRN Cammie Sickle, MD          .  PHYSICAL EXAMINATION: ECOG PERFORMANCE STATUS: 0 - Asymptomatic  Vitals:   02/25/20 1048  BP: (!) 100/46  Pulse: 82  Resp: 18  Temp: (!) 97.4 F (36.3 C)  SpO2: 97%   Filed Weights   02/25/20 1048  Weight: 139 lb 9.6 oz (63.3 kg)    Physical Exam Constitutional:      Comments: Accompanied by husband.  HENT:     Head: Normocephalic and atraumatic.     Mouth/Throat:     Pharynx: No oropharyngeal exudate.  Eyes:     Pupils: Pupils are equal, round, and reactive to light.  Cardiovascular:     Rate and Rhythm: Normal rate and regular rhythm.  Pulmonary:     Effort: Pulmonary effort is normal. No respiratory distress.     Breath sounds: Normal breath sounds. No wheezing.  Abdominal:     General: Bowel sounds are normal. There is no distension.     Palpations: Abdomen is soft. There is no mass.     Tenderness: There is no abdominal tenderness. There is no guarding or rebound.  Musculoskeletal:        General: No tenderness. Normal range of motion.     Cervical back: Normal range of motion and neck supple.  Skin:    General: Skin is warm.  Neurological:     Mental Status: She is alert and oriented to person, place, and time.  Psychiatric:        Mood and Affect: Affect normal.     Comments: Anxious    LABORATORY DATA:  I have reviewed the data as listed Lab Results  Component Value Date   WBC 5.7 02/25/2020   HGB 10.6 (L) 02/25/2020   HCT 32.5 (L) 02/25/2020   MCV 91.5 02/25/2020   PLT 270  02/25/2020   Recent Labs    08/14/19 1544 09/06/19 1054 10/10/19 1325 11/06/19 0950 12/25/19 0857 01/15/20 0846 02/25/20 1029  NA  --   --   --    < > 136 137 136  K  --   --   --    < > 4.0 3.8 4.0  CL  --   --   --    < > 107 105 106  CO2  --   --   --    < > 19* 20* 23  GLUCOSE  --   --   --    < > 174* 198* 96  BUN  --   --   --    < > '17 14 13  '$ CREATININE  --   --   --    < > 0.75 0.76 0.87  CALCIUM   < >  --  8.6*   < > 9.4 9.4 8.7*  GFRNONAA  --   --   --    < > >60 >60 >60  GFRAA  --   --   --    < > >60 >60 >60  PROT  --  7.6 6.5   < > 7.8 7.5 6.8  ALBUMIN  --  3.8 4.0   < > 4.2 4.2 3.9  AST   < > 27 23   < > '21 27 17  '$ ALT   < > 18 21   < > '21 28 14  '$ ALKPHOS   < > 174* 200*   < > 123 105 118  BILITOT  --  0.4 <0.2   < > 0.5 0.7 0.4  BILIDIR  --  <0.1 0.07  --   --   --   --   IBILI  --  NOT CALCULATED  --   --   --   --   --    < > = values in this interval not displayed.    RADIOGRAPHIC STUDIES: I have personally reviewed the radiological images as listed and agreed with the findings in the report. No results found.  ASSESSMENT & PLAN:   Carcinoma of upper-outer quadrant of left breast in female, estrogen receptor positive ([place]) # Left breast cancer-2 separate primaries- A] T2N1-ER/PR positive HER-2 negative;  B] T1N1 ER/PR positive HER-2 negative. S/p 4 cycles of adjuvant chemotherapy Taxotere [60 mg/m] Cytoxan.  #Discussed regarding proceeding with adjuvant radiation; awaiting evaluation with Dr. Donella Stade this morning.  Patient will need endocrine therapy post radiation.  # Diarrhea- improved; monitor closely.  #Chronic hypokalemia/diarrhea-potassium 4.0 today.  STABLE;recommend Kdur 20 meq/day.   #Anxiety-continue ativan prior to chemotherapy. STABLE.   #Elevated LFTs- alkaline phosphatase-123; improved- STABLE.   # Insomnia/likely secondary to anxiety- on valium improved.    # DISPOSITION:  #follow up in 2 months-MD; labs- cbc/cmp; port  flush-Dr.B  All questions were answered. The patient/family knows to call the clinic with any problems, questions or concerns.    Cammie Sickle, MD 02/25/2020 3:07 PM

## 2020-02-25 NOTE — Consult Note (Signed)
NEW Jessica Barnes EVALUATION  Name: Jessica Barnes  MRN: [id number]  Date:   02/25/2020     DOB: [birth year]   This 59 y.o. 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  Tumor stage Ib (T2 N1 aM0)  REFERRING PHYSICIAN: Tonia Ghent, MD  CHIEF COMPLAINT:  Chief Complaint  Jessica Barnes presents with  . Breast Cancer    Initial consultation    DIAGNOSIS: The encounter diagnosis was Carcinoma of upper-outer quadrant of left breast in female, estrogen receptor positive ([place]).   PREVIOUS INVESTIGATIONS:  Stage Ib  HPI: Jessica Barnes is a 59 year old female who presented with a self discovered mass in her left breast.  Mammogram as well as ultrasound this demonstrated a 2.2 x 2.4 cm mass within the upper outer quadrant of the left breast with skin and nipple retraction noted.  She also had an indeterminate 0.7 cm mild enhancing mass within the anterior outer left breast underlying the skin.  Evaluation of the second site the point 7 cm oval mass in the upper outer left breast was recommended.  Jessica Barnes underwent biopsy of both sites both positive for invasive mammary carcinoma.  Jessica Barnes underwent a wide local excision of the left breast showing a 3.7 cm invasive mammary carcinoma with margins clear but close at less than 1 mm there was in situ carcinoma again with closest margin 1 mm.  2 sentinel lymph nodes out of 2 were positive for metastatic disease.  No extranodal extension in the lymph nodes was noted.  Tumor was ER/PR positive HER-2/neu negative by FISH.  The other area also was removed showing a 0.8 cm area of invasive ductal carcinoma overall grade 1.  Margins were involved Jessica Barnes underwent reexcision of both sites.  Showing only benign parenchyma.  Jessica Barnes was then seen by medical oncology and underwent Taxotere plus  Cytoxan which she has tolerated fairly well.  Jessica Barnes has trouble with sleep.  She is now seen by radiation collagen for consideration of treatment.  She specifically denies breast tenderness cough or bone pain.  Jessica Barnes had genetic testing which was negative on the stat and common hereditary panel.  PLANNED TREATMENT REGIMEN: Left whole breast and peripheral lymphatic radiation  PAST MEDICAL HISTORY:  has a past medical history of Anxiety (10/13/96), Chest pain, COPD (chronic obstructive pulmonary disease) ([place]) (09/15/05), Depression (10/13/96), Family history of breast cancer, Family history of colon cancer, Family history of pancreatic cancer, Hyperlipidemia (09/15/98), Hypothyroidism (01/14/96), Migraine, NSVD (normal spontaneous vaginal delivery), PONV (postoperative nausea and vomiting), and PTSD (post-traumatic stress disorder).    PAST SURGICAL HISTORY:  Past Surgical History:  Procedure Laterality Date  . APPENDECTOMY     59 years of age  . BREAST BIOPSY Left 08/06/2019   [place] bx/ ribbon clip/ path pending  . BREAST BIOPSY Left 09/06/2019   [place] path pending heart clip  . BREAST LUMPECTOMY WITH RADIOACTIVE SEED AND SENTINEL LYMPH NODE BIOPSY Left 10/01/2019   Procedure: LEFT BREAST LUMPECTOMY WITH BRACKETED RADIOACTIVE SEEDS AND LEFT AXILLARY SENTINEL LYMPH NODE BIOPSY;  Surgeon: Rolm Bookbinder, MD;  Location: [facility];  Service: General;  Laterality: Left;  . PORTACATH PLACEMENT Right 10/28/2019   Procedure: INSERTION PORT-A-CATH WITH ULTRASOUND GUIDANCE;  Surgeon: Rolm Bookbinder, MD;  Location: [facility];  Service: General;  Laterality: Right;  . RE-EXCISION OF BREAST LUMPECTOMY Left 10/28/2019  Procedure: RE-EXCISION OF LEFT BREAST LUMPECTOMY;  Surgeon: Rolm Bookbinder, MD;  Location: [facility];  Service: General;  Laterality: Left;    FAMILY HISTORY: family history includes Breast cancer in her cousin, paternal aunt, paternal aunt, and  paternal grandmother; Colon cancer in her father, paternal grandmother, and paternal uncle; Hypertension in her mother; Pancreatic cancer in her maternal uncle; Thyroid disease in her sister.  SOCIAL HISTORY:  reports that she has quit smoking. Her smoking use included cigarettes. She has a 4.00 pack-year smoking history. She has never used smokeless tobacco. She reports that she does not drink alcohol and does not use drugs.  ALLERGIES: Sulfamethoxazole-trimethoprim and Codeine  MEDICATIONS:  Current Outpatient Medications  Medication Sig Dispense Refill  . acetaminophen (TYLENOL) 500 MG tablet Take 1,000 mg by mouth every 6 (six) hours as needed for mild pain or headache.    . butalbital-acetaminophen-caffeine (FIORICET) 50-325-40 MG tablet TAKE 1 TABLET BY MOUTH EVERY 4 TO 6 HOURS AS NEEDED FOR HEADACHE 90 tablet 3  . dexamethasone (DECADRON) 4 MG tablet 1 tablet AM & PM; Take Day prior and day after chemo. 30 tablet 0  . diazepam (VALIUM) 10 MG tablet Take 1 tablet (10 mg total) by mouth at bedtime as needed. for sleep 30 tablet 0  . dimenhyDRINATE (DRAMAMINE) 50 MG tablet Take 50 mg by mouth every 8 (eight) hours as needed.    . diphenoxylate-atropine (LOMOTIL) 2.5-0.025 MG tablet Take 1 tablet by mouth 4 (four) times daily as needed for diarrhea or loose stools. Take it along with immodium 30 tablet 0  . FLUoxetine (PROZAC) 20 MG capsule TAKE 3 CAPSULES BY MOUTH DAILY 270 capsule 1  . FLUoxetine (PROZAC) 20 MG tablet Take 60 mg by mouth daily.    . hydrOXYzine (ATARAX/VISTARIL) 25 MG tablet Take 1 tablet (25 mg total) by mouth 3 (three) times daily as needed. 30 tablet 0  . ibuprofen (ADVIL) 100 MG tablet Take 100 mg by mouth every 6 (six) hours as needed for fever.    . levothyroxine (SYNTHROID) 88 MCG tablet TAKE 1 TABLET BY MOUTH DAILY EXCEPT ON SUNDAYS TAKE 1/2 TABLET 90 tablet 0  . lidocaine-prilocaine (EMLA) cream Apply 1 application topically as needed. 30 g 0  . loperamide (IMODIUM  A-D) 2 MG tablet Take 4 mg by mouth 4 (four) times daily as needed for diarrhea or loose stools.    . ondansetron (ZOFRAN) 8 MG tablet One pill every 8 hours as needed for nausea/vomitting. 40 tablet 1  . oxyCODONE (OXY IR/ROXICODONE) 5 MG immediate release tablet Take 1 tablet (5 mg total) by mouth every 6 (six) hours as needed for moderate pain, severe pain or breakthrough pain. 10 tablet 0  . potassium chloride SA (KLOR-CON) 20 MEQ tablet TAKE 1 TABLET BY MOUTH TWICE A DAY 60 tablet 3  . predniSONE (STERAPRED UNI-PAK 21 TAB) 10 MG (21) TBPK tablet Take as directed. 21 tablet 0  . prochlorperazine (COMPAZINE) 10 MG tablet Take 1 tablet (10 mg total) by mouth every 6 (six) hours as needed for nausea or vomiting. 40 tablet 1  . triamcinolone (KENALOG) 0.025 % ointment Apply 1 application topically 3 (three) times daily. 80 g 0   No current facility-administered medications for this encounter.   Facility-Administered Medications Ordered in Other Encounters  Medication Dose Route Frequency Provider Last Rate Last Admin  . heparin lock flush 100 unit/mL  500 Units Intravenous Once Cammie Sickle, MD      . sodium  chloride flush (NS) 0.9 % injection 10 mL  10 mL Intravenous PRN Cammie Sickle, MD        ECOG PERFORMANCE STATUS:  0 - Asymptomatic  REVIEW OF SYSTEMS: Jessica Barnes denies any weight loss, fatigue, weakness, fever, chills or night sweats. Jessica Barnes denies any loss of vision, blurred vision. Jessica Barnes denies any ringing  of the ears or hearing loss. No irregular heartbeat. Jessica Barnes denies heart murmur or history of fainting. Jessica Barnes denies any chest pain or pain radiating to her upper extremities. Jessica Barnes denies any shortness of breath, difficulty breathing at night, cough or hemoptysis. Jessica Barnes denies any swelling in the lower legs. Jessica Barnes denies any nausea vomiting, vomiting of blood, or coffee ground material in the vomitus. Jessica Barnes denies any stomach pain. Jessica Barnes states has had  normal bowel movements no significant constipation or diarrhea. Jessica Barnes denies any dysuria, hematuria or significant nocturia. Jessica Barnes denies any problems walking, swelling in the joints or loss of balance. Jessica Barnes denies any skin changes, loss of hair or loss of weight. Jessica Barnes denies any excessive worrying or anxiety or significant depression. Jessica Barnes denies any problems with insomnia. Jessica Barnes denies excessive thirst, polyuria, polydipsia. Jessica Barnes denies any swollen glands, Jessica Barnes denies easy bruising or easy bleeding. Jessica Barnes denies any recent infections, allergies or URI. Jessica Barnes "s visual fields have not changed significantly in recent time.   PHYSICAL EXAM: LMP 11/30/2011  Left breast has a wide local excision site present in the region of the retroareolar complex.  There is retraction of the circumferential scar with some tethering towards the scar incision.  No dominant mass or nodularity is noted in either breast in 2 positions examined.  No axillary or supraclavicular adenopathy is identified.  Well-developed well-nourished Jessica Barnes in NAD. HEENT reveals PERLA, EOMI, discs not visualized.  Oral cavity is clear. No oral mucosal lesions are identified. Neck is clear without evidence of cervical or supraclavicular adenopathy. Lungs are clear to A&P. Cardiac examination is essentially unremarkable with regular rate and rhythm without murmur rub or thrill. Abdomen is benign with no organomegaly or masses noted. Motor sensory and DTR levels are equal and symmetric in the upper and lower extremities. Cranial nerves II through XII are grossly intact. Proprioception is intact. No peripheral adenopathy or edema is identified. No motor or sensory levels are noted. Crude visual fields are within normal range.  LABORATORY DATA: Pathology report reviewed    RADIOLOGY RESULTS: Mammogram ultrasound and MRI scans reviewed   IMPRESSION: Stage Ib multifocal invasive mammary carcinoma the left breast status post  wide local excision of both sites plus adjuvant chemotherapy in 59 year old female  PLAN: At this time I have recommended whole breast and peripheral lymphatic radiation to her left breast.  We will plan on delivering 5040 cGy in 28 fractions to both the sites will also boost her scar another 1400 cGy based on the close margin.  Risks and benefits of treatment including 6 hyperpigmentation of the skin somewhat contraction of the breast, fatigue skin reaction alteration of blood counts possible occlusion of superficial lung and slight chance of lymphedema in her left upper extremity all were described in detail to the Jessica Barnes and her husband.  They both seem to comprehend my treatment plan well.  I have personally set up and ordered CT simulation for later this week.  Jessica Barnes also benefit from added antiestrogen therapy after completion of radiation.  Jessica Barnes husband both seem to comprehend my treatment plan well.  I would like to take this opportunity to thank you for allowing  me to participate in the care of your Jessica Barnes.Noreene Filbert, MD

## 2020-02-26 ENCOUNTER — Other Ambulatory Visit: Payer: Self-pay | Admitting: Internal Medicine

## 2020-02-27 NOTE — Telephone Encounter (Signed)
Called patient and left voicemail for her to call the office back to schedule cpe and labs.

## 2020-02-28 ENCOUNTER — Ambulatory Visit
Admission: RE | Admit: 2020-02-28 | Discharge: 2020-02-28 | Disposition: A | Discharge status: Home / Self Care | Payer: No Typology Code available for payment source | Source: Ambulatory Visit | Attending: Radiation Oncology | Admitting: Radiation Oncology

## 2020-02-28 DIAGNOSIS — Z170 Estrogen receptor positive status [ER+]: Secondary | ICD-10-CM | POA: Insufficient documentation

## 2020-02-28 DIAGNOSIS — Z510 Encounter for antineoplastic radiation therapy: Secondary | ICD-10-CM | POA: Insufficient documentation

## 2020-02-28 DIAGNOSIS — C50412 Malignant neoplasm of upper-outer quadrant of left female breast: Secondary | ICD-10-CM | POA: Diagnosis present

## 2020-03-05 DIAGNOSIS — C50412 Malignant neoplasm of upper-outer quadrant of left female breast: Secondary | ICD-10-CM | POA: Diagnosis not present

## 2020-03-09 ENCOUNTER — Ambulatory Visit: Admission: RE | Admit: 2020-03-09 | Payer: No Typology Code available for payment source | Source: Ambulatory Visit

## 2020-03-09 DIAGNOSIS — C50412 Malignant neoplasm of upper-outer quadrant of left female breast: Secondary | ICD-10-CM | POA: Diagnosis not present

## 2020-03-10 ENCOUNTER — Ambulatory Visit
Admission: RE | Admit: 2020-03-10 | Discharge: 2020-03-10 | Disposition: A | Discharge status: Home / Self Care | Payer: No Typology Code available for payment source | Source: Ambulatory Visit | Attending: Radiation Oncology | Admitting: Radiation Oncology

## 2020-03-10 DIAGNOSIS — C50412 Malignant neoplasm of upper-outer quadrant of left female breast: Secondary | ICD-10-CM | POA: Diagnosis not present

## 2020-03-11 ENCOUNTER — Other Ambulatory Visit: Payer: Self-pay | Admitting: *Deleted

## 2020-03-11 ENCOUNTER — Ambulatory Visit
Admission: RE | Admit: 2020-03-11 | Discharge: 2020-03-11 | Disposition: A | Discharge status: Home / Self Care | Payer: No Typology Code available for payment source | Source: Ambulatory Visit | Attending: Radiation Oncology | Admitting: Radiation Oncology

## 2020-03-11 DIAGNOSIS — Z170 Estrogen receptor positive status [ER+]: Secondary | ICD-10-CM

## 2020-03-11 DIAGNOSIS — C50412 Malignant neoplasm of upper-outer quadrant of left female breast: Secondary | ICD-10-CM

## 2020-03-12 ENCOUNTER — Ambulatory Visit
Admission: RE | Admit: 2020-03-12 | Discharge: 2020-03-12 | Disposition: A | Discharge status: Home / Self Care | Payer: No Typology Code available for payment source | Source: Ambulatory Visit | Attending: Radiation Oncology | Admitting: Radiation Oncology

## 2020-03-12 DIAGNOSIS — C50412 Malignant neoplasm of upper-outer quadrant of left female breast: Secondary | ICD-10-CM | POA: Diagnosis not present

## 2020-03-13 ENCOUNTER — Ambulatory Visit
Admission: RE | Admit: 2020-03-13 | Discharge: 2020-03-13 | Disposition: A | Discharge status: Home / Self Care | Payer: No Typology Code available for payment source | Source: Ambulatory Visit | Attending: Radiation Oncology | Admitting: Radiation Oncology

## 2020-03-13 DIAGNOSIS — C50412 Malignant neoplasm of upper-outer quadrant of left female breast: Secondary | ICD-10-CM | POA: Diagnosis not present

## 2020-03-16 ENCOUNTER — Ambulatory Visit
Admission: RE | Admit: 2020-03-16 | Discharge: 2020-03-16 | Disposition: A | Discharge status: Home / Self Care | Payer: No Typology Code available for payment source | Source: Ambulatory Visit | Attending: Radiation Oncology | Admitting: Radiation Oncology

## 2020-03-16 DIAGNOSIS — Z510 Encounter for antineoplastic radiation therapy: Secondary | ICD-10-CM | POA: Insufficient documentation

## 2020-03-16 DIAGNOSIS — C50412 Malignant neoplasm of upper-outer quadrant of left female breast: Secondary | ICD-10-CM | POA: Insufficient documentation

## 2020-03-16 DIAGNOSIS — Z170 Estrogen receptor positive status [ER+]: Secondary | ICD-10-CM | POA: Diagnosis not present

## 2020-03-17 ENCOUNTER — Ambulatory Visit
Admission: RE | Admit: 2020-03-17 | Discharge: 2020-03-17 | Disposition: A | Discharge status: Home / Self Care | Payer: No Typology Code available for payment source | Source: Ambulatory Visit | Attending: Radiation Oncology | Admitting: Radiation Oncology

## 2020-03-17 DIAGNOSIS — C50412 Malignant neoplasm of upper-outer quadrant of left female breast: Secondary | ICD-10-CM | POA: Diagnosis not present

## 2020-03-18 ENCOUNTER — Ambulatory Visit
Admission: RE | Admit: 2020-03-18 | Discharge: 2020-03-18 | Disposition: A | Discharge status: Home / Self Care | Payer: No Typology Code available for payment source | Source: Ambulatory Visit | Attending: Radiation Oncology | Admitting: Radiation Oncology

## 2020-03-18 DIAGNOSIS — C50412 Malignant neoplasm of upper-outer quadrant of left female breast: Secondary | ICD-10-CM | POA: Diagnosis not present

## 2020-03-19 ENCOUNTER — Ambulatory Visit
Admission: RE | Admit: 2020-03-19 | Discharge: 2020-03-19 | Disposition: A | Discharge status: Home / Self Care | Payer: No Typology Code available for payment source | Source: Ambulatory Visit | Attending: Radiation Oncology | Admitting: Radiation Oncology

## 2020-03-19 DIAGNOSIS — C50412 Malignant neoplasm of upper-outer quadrant of left female breast: Secondary | ICD-10-CM | POA: Diagnosis not present

## 2020-03-20 ENCOUNTER — Ambulatory Visit
Admission: RE | Admit: 2020-03-20 | Discharge: 2020-03-20 | Disposition: A | Discharge status: Home / Self Care | Payer: No Typology Code available for payment source | Source: Ambulatory Visit | Attending: Radiation Oncology | Admitting: Radiation Oncology

## 2020-03-20 DIAGNOSIS — C50412 Malignant neoplasm of upper-outer quadrant of left female breast: Secondary | ICD-10-CM | POA: Diagnosis not present

## 2020-03-23 ENCOUNTER — Ambulatory Visit
Admission: RE | Admit: 2020-03-23 | Discharge: 2020-03-23 | Disposition: A | Discharge status: Home / Self Care | Payer: No Typology Code available for payment source | Source: Ambulatory Visit | Attending: Radiation Oncology | Admitting: Radiation Oncology

## 2020-03-23 DIAGNOSIS — C50412 Malignant neoplasm of upper-outer quadrant of left female breast: Secondary | ICD-10-CM | POA: Diagnosis not present

## 2020-03-24 ENCOUNTER — Ambulatory Visit
Admission: RE | Admit: 2020-03-24 | Discharge: 2020-03-24 | Disposition: A | Discharge status: Home / Self Care | Payer: No Typology Code available for payment source | Source: Ambulatory Visit | Attending: Radiation Oncology | Admitting: Radiation Oncology

## 2020-03-24 DIAGNOSIS — C50412 Malignant neoplasm of upper-outer quadrant of left female breast: Secondary | ICD-10-CM | POA: Diagnosis not present

## 2020-03-25 ENCOUNTER — Ambulatory Visit
Admission: RE | Admit: 2020-03-25 | Discharge: 2020-03-25 | Disposition: A | Discharge status: Home / Self Care | Payer: No Typology Code available for payment source | Source: Ambulatory Visit | Attending: Radiation Oncology | Admitting: Radiation Oncology

## 2020-03-25 DIAGNOSIS — C50412 Malignant neoplasm of upper-outer quadrant of left female breast: Secondary | ICD-10-CM | POA: Diagnosis not present

## 2020-03-26 ENCOUNTER — Other Ambulatory Visit: Payer: Self-pay

## 2020-03-26 ENCOUNTER — Inpatient Hospital Stay: Payer: No Typology Code available for payment source | Attending: Internal Medicine

## 2020-03-26 ENCOUNTER — Ambulatory Visit
Admission: RE | Admit: 2020-03-26 | Discharge: 2020-03-26 | Disposition: A | Discharge status: Home / Self Care | Payer: No Typology Code available for payment source | Source: Ambulatory Visit | Attending: Radiation Oncology | Admitting: Radiation Oncology

## 2020-03-26 DIAGNOSIS — C50412 Malignant neoplasm of upper-outer quadrant of left female breast: Secondary | ICD-10-CM | POA: Diagnosis present

## 2020-03-26 DIAGNOSIS — Z170 Estrogen receptor positive status [ER+]: Secondary | ICD-10-CM | POA: Insufficient documentation

## 2020-03-26 LAB — CBC
HCT: 37.8 % (ref 36.0–46.0)
Hemoglobin: 12.1 g/dL (ref 12.0–15.0)
MCH: 28.6 pg (ref 26.0–34.0)
MCHC: 32 g/dL (ref 30.0–36.0)
MCV: 89.4 fL (ref 80.0–100.0)
Platelets: 277 10*3/uL (ref 150–400)
RBC: 4.23 MIL/uL (ref 3.87–5.11)
RDW: 12.5 % (ref 11.5–15.5)
WBC: 5 10*3/uL (ref 4.0–10.5)
nRBC: 0 % (ref 0.0–0.2)

## 2020-03-27 ENCOUNTER — Ambulatory Visit
Admission: RE | Admit: 2020-03-27 | Discharge: 2020-03-27 | Disposition: A | Discharge status: Home / Self Care | Payer: No Typology Code available for payment source | Source: Ambulatory Visit | Attending: Radiation Oncology | Admitting: Radiation Oncology

## 2020-03-27 DIAGNOSIS — C50412 Malignant neoplasm of upper-outer quadrant of left female breast: Secondary | ICD-10-CM | POA: Diagnosis not present

## 2020-03-30 ENCOUNTER — Ambulatory Visit
Admission: RE | Admit: 2020-03-30 | Discharge: 2020-03-30 | Disposition: A | Discharge status: Home / Self Care | Payer: No Typology Code available for payment source | Source: Ambulatory Visit | Attending: Radiation Oncology | Admitting: Radiation Oncology

## 2020-03-30 DIAGNOSIS — C50412 Malignant neoplasm of upper-outer quadrant of left female breast: Secondary | ICD-10-CM | POA: Diagnosis not present

## 2020-03-31 ENCOUNTER — Ambulatory Visit
Admission: RE | Admit: 2020-03-31 | Discharge: 2020-03-31 | Disposition: A | Discharge status: Home / Self Care | Payer: No Typology Code available for payment source | Source: Ambulatory Visit | Attending: Radiation Oncology | Admitting: Radiation Oncology

## 2020-03-31 DIAGNOSIS — C50412 Malignant neoplasm of upper-outer quadrant of left female breast: Secondary | ICD-10-CM | POA: Diagnosis not present

## 2020-03-31 NOTE — Telephone Encounter (Signed)
Called patient and husband answered, he stated he would tell patient to give [place] a call asap.

## 2020-04-01 ENCOUNTER — Ambulatory Visit
Admission: RE | Admit: 2020-04-01 | Discharge: 2020-04-01 | Disposition: A | Discharge status: Home / Self Care | Payer: No Typology Code available for payment source | Source: Ambulatory Visit | Attending: Radiation Oncology | Admitting: Radiation Oncology

## 2020-04-01 DIAGNOSIS — C50412 Malignant neoplasm of upper-outer quadrant of left female breast: Secondary | ICD-10-CM | POA: Diagnosis not present

## 2020-04-02 ENCOUNTER — Ambulatory Visit
Admission: RE | Admit: 2020-04-02 | Discharge: 2020-04-02 | Disposition: A | Discharge status: Home / Self Care | Payer: No Typology Code available for payment source | Source: Ambulatory Visit | Attending: Radiation Oncology | Admitting: Radiation Oncology

## 2020-04-02 DIAGNOSIS — C50412 Malignant neoplasm of upper-outer quadrant of left female breast: Secondary | ICD-10-CM | POA: Diagnosis not present

## 2020-04-03 ENCOUNTER — Ambulatory Visit
Admission: RE | Admit: 2020-04-03 | Discharge: 2020-04-03 | Disposition: A | Discharge status: Home / Self Care | Payer: No Typology Code available for payment source | Source: Ambulatory Visit | Attending: Radiation Oncology | Admitting: Radiation Oncology

## 2020-04-03 DIAGNOSIS — C50412 Malignant neoplasm of upper-outer quadrant of left female breast: Secondary | ICD-10-CM | POA: Diagnosis not present

## 2020-04-06 ENCOUNTER — Ambulatory Visit
Admission: RE | Admit: 2020-04-06 | Discharge: 2020-04-06 | Disposition: A | Discharge status: Home / Self Care | Payer: No Typology Code available for payment source | Source: Ambulatory Visit | Attending: Radiation Oncology | Admitting: Radiation Oncology

## 2020-04-06 DIAGNOSIS — C50412 Malignant neoplasm of upper-outer quadrant of left female breast: Secondary | ICD-10-CM | POA: Diagnosis not present

## 2020-04-07 ENCOUNTER — Ambulatory Visit
Admission: RE | Admit: 2020-04-07 | Discharge: 2020-04-07 | Disposition: A | Discharge status: Home / Self Care | Payer: No Typology Code available for payment source | Source: Ambulatory Visit | Attending: Radiation Oncology | Admitting: Radiation Oncology

## 2020-04-07 DIAGNOSIS — C50412 Malignant neoplasm of upper-outer quadrant of left female breast: Secondary | ICD-10-CM | POA: Diagnosis not present

## 2020-04-08 ENCOUNTER — Ambulatory Visit
Admission: RE | Admit: 2020-04-08 | Discharge: 2020-04-08 | Disposition: A | Discharge status: Home / Self Care | Payer: No Typology Code available for payment source | Source: Ambulatory Visit | Attending: Radiation Oncology | Admitting: Radiation Oncology

## 2020-04-08 DIAGNOSIS — C50412 Malignant neoplasm of upper-outer quadrant of left female breast: Secondary | ICD-10-CM | POA: Diagnosis not present

## 2020-04-09 ENCOUNTER — Inpatient Hospital Stay: Payer: No Typology Code available for payment source

## 2020-04-09 ENCOUNTER — Ambulatory Visit
Admission: RE | Admit: 2020-04-09 | Discharge: 2020-04-09 | Disposition: A | Discharge status: Home / Self Care | Payer: No Typology Code available for payment source | Source: Ambulatory Visit | Attending: Radiation Oncology | Admitting: Radiation Oncology

## 2020-04-09 DIAGNOSIS — C50412 Malignant neoplasm of upper-outer quadrant of left female breast: Secondary | ICD-10-CM | POA: Diagnosis not present

## 2020-04-10 ENCOUNTER — Ambulatory Visit
Admission: RE | Admit: 2020-04-10 | Discharge: 2020-04-10 | Disposition: A | Discharge status: Home / Self Care | Payer: No Typology Code available for payment source | Source: Ambulatory Visit | Attending: Radiation Oncology | Admitting: Radiation Oncology

## 2020-04-10 DIAGNOSIS — C50412 Malignant neoplasm of upper-outer quadrant of left female breast: Secondary | ICD-10-CM | POA: Diagnosis not present

## 2020-04-13 ENCOUNTER — Ambulatory Visit
Admission: RE | Admit: 2020-04-13 | Discharge: 2020-04-13 | Disposition: A | Discharge status: Home / Self Care | Payer: No Typology Code available for payment source | Source: Ambulatory Visit | Attending: Radiation Oncology | Admitting: Radiation Oncology

## 2020-04-13 DIAGNOSIS — C50412 Malignant neoplasm of upper-outer quadrant of left female breast: Secondary | ICD-10-CM | POA: Diagnosis not present

## 2020-04-14 ENCOUNTER — Ambulatory Visit
Admission: RE | Admit: 2020-04-14 | Discharge: 2020-04-14 | Disposition: A | Discharge status: Home / Self Care | Payer: No Typology Code available for payment source | Source: Ambulatory Visit | Attending: Radiation Oncology | Admitting: Radiation Oncology

## 2020-04-14 DIAGNOSIS — C50412 Malignant neoplasm of upper-outer quadrant of left female breast: Secondary | ICD-10-CM | POA: Diagnosis not present

## 2020-04-15 ENCOUNTER — Ambulatory Visit
Admission: RE | Admit: 2020-04-15 | Discharge: 2020-04-15 | Disposition: A | Discharge status: Home / Self Care | Payer: No Typology Code available for payment source | Source: Ambulatory Visit | Attending: Radiation Oncology | Admitting: Radiation Oncology

## 2020-04-15 DIAGNOSIS — Z510 Encounter for antineoplastic radiation therapy: Secondary | ICD-10-CM | POA: Diagnosis not present

## 2020-04-15 DIAGNOSIS — Z170 Estrogen receptor positive status [ER+]: Secondary | ICD-10-CM | POA: Diagnosis not present

## 2020-04-15 DIAGNOSIS — C50412 Malignant neoplasm of upper-outer quadrant of left female breast: Secondary | ICD-10-CM | POA: Insufficient documentation

## 2020-04-16 ENCOUNTER — Ambulatory Visit
Admission: RE | Admit: 2020-04-16 | Discharge: 2020-04-16 | Disposition: A | Discharge status: Home / Self Care | Payer: No Typology Code available for payment source | Source: Ambulatory Visit | Attending: Radiation Oncology | Admitting: Radiation Oncology

## 2020-04-16 DIAGNOSIS — C50412 Malignant neoplasm of upper-outer quadrant of left female breast: Secondary | ICD-10-CM | POA: Diagnosis not present

## 2020-04-17 ENCOUNTER — Ambulatory Visit
Admission: RE | Admit: 2020-04-17 | Discharge: 2020-04-17 | Disposition: A | Discharge status: Home / Self Care | Payer: No Typology Code available for payment source | Source: Ambulatory Visit | Attending: Radiation Oncology | Admitting: Radiation Oncology

## 2020-04-17 DIAGNOSIS — C50412 Malignant neoplasm of upper-outer quadrant of left female breast: Secondary | ICD-10-CM | POA: Diagnosis not present

## 2020-04-21 ENCOUNTER — Other Ambulatory Visit: Payer: Self-pay | Admitting: Internal Medicine

## 2020-04-21 ENCOUNTER — Ambulatory Visit
Admission: RE | Admit: 2020-04-21 | Discharge: 2020-04-21 | Disposition: A | Discharge status: Home / Self Care | Payer: No Typology Code available for payment source | Source: Ambulatory Visit | Attending: Radiation Oncology | Admitting: Radiation Oncology

## 2020-04-21 DIAGNOSIS — C50412 Malignant neoplasm of upper-outer quadrant of left female breast: Secondary | ICD-10-CM | POA: Diagnosis not present

## 2020-04-22 ENCOUNTER — Ambulatory Visit
Admission: RE | Admit: 2020-04-22 | Discharge: 2020-04-22 | Disposition: A | Discharge status: Home / Self Care | Payer: No Typology Code available for payment source | Source: Ambulatory Visit | Attending: Radiation Oncology | Admitting: Radiation Oncology

## 2020-04-22 DIAGNOSIS — C50412 Malignant neoplasm of upper-outer quadrant of left female breast: Secondary | ICD-10-CM | POA: Diagnosis not present

## 2020-04-23 ENCOUNTER — Inpatient Hospital Stay: Payer: No Typology Code available for payment source | Attending: Internal Medicine

## 2020-04-23 ENCOUNTER — Ambulatory Visit
Admission: RE | Admit: 2020-04-23 | Discharge: 2020-04-23 | Disposition: A | Discharge status: Home / Self Care | Payer: No Typology Code available for payment source | Source: Ambulatory Visit | Attending: Radiation Oncology | Admitting: Radiation Oncology

## 2020-04-23 DIAGNOSIS — Z87891 Personal history of nicotine dependence: Secondary | ICD-10-CM | POA: Insufficient documentation

## 2020-04-23 DIAGNOSIS — Z79811 Long term (current) use of aromatase inhibitors: Secondary | ICD-10-CM | POA: Insufficient documentation

## 2020-04-23 DIAGNOSIS — Z452 Encounter for adjustment and management of vascular access device: Secondary | ICD-10-CM | POA: Insufficient documentation

## 2020-04-23 DIAGNOSIS — Z803 Family history of malignant neoplasm of breast: Secondary | ICD-10-CM | POA: Insufficient documentation

## 2020-04-23 DIAGNOSIS — F329 Major depressive disorder, single episode, unspecified: Secondary | ICD-10-CM | POA: Insufficient documentation

## 2020-04-23 DIAGNOSIS — Z79899 Other long term (current) drug therapy: Secondary | ICD-10-CM | POA: Insufficient documentation

## 2020-04-23 DIAGNOSIS — F419 Anxiety disorder, unspecified: Secondary | ICD-10-CM | POA: Insufficient documentation

## 2020-04-23 DIAGNOSIS — E876 Hypokalemia: Secondary | ICD-10-CM | POA: Insufficient documentation

## 2020-04-23 DIAGNOSIS — Z8349 Family history of other endocrine, nutritional and metabolic diseases: Secondary | ICD-10-CM | POA: Insufficient documentation

## 2020-04-23 DIAGNOSIS — G4700 Insomnia, unspecified: Secondary | ICD-10-CM | POA: Insufficient documentation

## 2020-04-23 DIAGNOSIS — Z800 Family history of malignant neoplasm of digestive organs: Secondary | ICD-10-CM | POA: Insufficient documentation

## 2020-04-23 DIAGNOSIS — C50412 Malignant neoplasm of upper-outer quadrant of left female breast: Secondary | ICD-10-CM | POA: Diagnosis not present

## 2020-04-23 DIAGNOSIS — J449 Chronic obstructive pulmonary disease, unspecified: Secondary | ICD-10-CM | POA: Insufficient documentation

## 2020-04-23 DIAGNOSIS — Z8249 Family history of ischemic heart disease and other diseases of the circulatory system: Secondary | ICD-10-CM | POA: Insufficient documentation

## 2020-04-23 DIAGNOSIS — Z170 Estrogen receptor positive status [ER+]: Secondary | ICD-10-CM | POA: Insufficient documentation

## 2020-04-24 ENCOUNTER — Ambulatory Visit
Admission: RE | Admit: 2020-04-24 | Discharge: 2020-04-24 | Disposition: A | Discharge status: Home / Self Care | Payer: No Typology Code available for payment source | Source: Ambulatory Visit | Attending: Radiation Oncology | Admitting: Radiation Oncology

## 2020-04-24 DIAGNOSIS — C50412 Malignant neoplasm of upper-outer quadrant of left female breast: Secondary | ICD-10-CM | POA: Diagnosis not present

## 2020-04-27 ENCOUNTER — Ambulatory Visit
Admission: RE | Admit: 2020-04-27 | Discharge: 2020-04-27 | Disposition: A | Discharge status: Home / Self Care | Payer: No Typology Code available for payment source | Source: Ambulatory Visit | Attending: Radiation Oncology | Admitting: Radiation Oncology

## 2020-04-27 DIAGNOSIS — C50412 Malignant neoplasm of upper-outer quadrant of left female breast: Secondary | ICD-10-CM | POA: Diagnosis not present

## 2020-04-27 NOTE — Progress Notes (Signed)
1615-04/27/2020- Called patient for prescreening questions prior to md apt. No answer. Unable to leave vm.

## 2020-04-28 ENCOUNTER — Ambulatory Visit
Admission: RE | Admit: 2020-04-28 | Discharge: 2020-04-28 | Disposition: A | Discharge status: Home / Self Care | Payer: No Typology Code available for payment source | Source: Ambulatory Visit | Attending: Radiation Oncology | Admitting: Radiation Oncology

## 2020-04-28 ENCOUNTER — Other Ambulatory Visit: Payer: Self-pay

## 2020-04-28 ENCOUNTER — Inpatient Hospital Stay (HOSPITAL_BASED_OUTPATIENT_CLINIC_OR_DEPARTMENT_OTHER): Payer: No Typology Code available for payment source | Admitting: Internal Medicine

## 2020-04-28 ENCOUNTER — Inpatient Hospital Stay: Payer: No Typology Code available for payment source

## 2020-04-28 ENCOUNTER — Telehealth: Payer: Self-pay

## 2020-04-28 VITALS — BP 128/68 | HR 90 | Temp 95.2°F | Resp 18 | Ht 63.0 in | Wt 138.5 lb

## 2020-04-28 DIAGNOSIS — Z803 Family history of malignant neoplasm of breast: Secondary | ICD-10-CM | POA: Diagnosis not present

## 2020-04-28 DIAGNOSIS — Z79899 Other long term (current) drug therapy: Secondary | ICD-10-CM | POA: Diagnosis not present

## 2020-04-28 DIAGNOSIS — E876 Hypokalemia: Secondary | ICD-10-CM | POA: Diagnosis not present

## 2020-04-28 DIAGNOSIS — Z87891 Personal history of nicotine dependence: Secondary | ICD-10-CM | POA: Diagnosis not present

## 2020-04-28 DIAGNOSIS — M810 Age-related osteoporosis without current pathological fracture: Secondary | ICD-10-CM | POA: Diagnosis not present

## 2020-04-28 DIAGNOSIS — Z170 Estrogen receptor positive status [ER+]: Secondary | ICD-10-CM | POA: Diagnosis not present

## 2020-04-28 DIAGNOSIS — Z452 Encounter for adjustment and management of vascular access device: Secondary | ICD-10-CM | POA: Diagnosis not present

## 2020-04-28 DIAGNOSIS — G4700 Insomnia, unspecified: Secondary | ICD-10-CM | POA: Diagnosis not present

## 2020-04-28 DIAGNOSIS — C50412 Malignant neoplasm of upper-outer quadrant of left female breast: Secondary | ICD-10-CM | POA: Diagnosis not present

## 2020-04-28 DIAGNOSIS — Z79811 Long term (current) use of aromatase inhibitors: Secondary | ICD-10-CM | POA: Diagnosis not present

## 2020-04-28 DIAGNOSIS — F419 Anxiety disorder, unspecified: Secondary | ICD-10-CM | POA: Diagnosis not present

## 2020-04-28 DIAGNOSIS — Z8349 Family history of other endocrine, nutritional and metabolic diseases: Secondary | ICD-10-CM | POA: Diagnosis not present

## 2020-04-28 DIAGNOSIS — F329 Major depressive disorder, single episode, unspecified: Secondary | ICD-10-CM | POA: Diagnosis not present

## 2020-04-28 DIAGNOSIS — Z8249 Family history of ischemic heart disease and other diseases of the circulatory system: Secondary | ICD-10-CM | POA: Diagnosis not present

## 2020-04-28 DIAGNOSIS — R748 Abnormal levels of other serum enzymes: Secondary | ICD-10-CM

## 2020-04-28 DIAGNOSIS — Z95828 Presence of other vascular implants and grafts: Secondary | ICD-10-CM

## 2020-04-28 DIAGNOSIS — J449 Chronic obstructive pulmonary disease, unspecified: Secondary | ICD-10-CM | POA: Diagnosis not present

## 2020-04-28 DIAGNOSIS — Z800 Family history of malignant neoplasm of digestive organs: Secondary | ICD-10-CM | POA: Diagnosis not present

## 2020-04-28 LAB — COMPREHENSIVE METABOLIC PANEL
ALT: 17 U/L (ref 0–44)
AST: 20 U/L (ref 15–41)
Albumin: 3.9 g/dL (ref 3.5–5.0)
Alkaline Phosphatase: 104 U/L (ref 38–126)
Anion gap: 9 (ref 5–15)
BUN: 14 mg/dL (ref 6–20)
CO2: 21 mmol/L — ABNORMAL LOW (ref 22–32)
Calcium: 9 mg/dL (ref 8.9–10.3)
Chloride: 108 mmol/L (ref 98–111)
Creatinine, Ser: 0.77 mg/dL (ref 0.44–1.00)
GFR calc Af Amer: >60 mL/min (ref >60)
GFR calc non Af Amer: >60 mL/min (ref >60)
Glucose, Bld: 112 mg/dL — ABNORMAL HIGH (ref 70–99)
Potassium: 3.5 mmol/L (ref 3.5–5.1)
Sodium: 138 mmol/L (ref 135–145)
Total Bilirubin: 0.5 mg/dL (ref 0.3–1.2)
Total Protein: 7.1 g/dL (ref 6.5–8.1)

## 2020-04-28 LAB — CBC WITH DIFFERENTIAL/PLATELET
Abs Immature Granulocytes: 0.01 10*3/uL (ref 0.00–0.07)
Basophils Absolute: 0 10*3/uL (ref 0.0–0.1)
Basophils Relative: 1 %
Eosinophils Absolute: 0.3 10*3/uL (ref 0.0–0.5)
Eosinophils Relative: 5 %
HCT: 38.6 % (ref 36.0–46.0)
Hemoglobin: 12.8 g/dL (ref 12.0–15.0)
Immature Granulocytes: 0 %
Lymphocytes Relative: 18 %
Lymphs Abs: 1 10*3/uL (ref 0.7–4.0)
MCH: 28.6 pg (ref 26.0–34.0)
MCHC: 33.2 g/dL (ref 30.0–36.0)
MCV: 86.2 fL (ref 80.0–100.0)
Monocytes Absolute: 0.6 10*3/uL (ref 0.1–1.0)
Monocytes Relative: 11 %
Neutro Abs: 3.7 10*3/uL (ref 1.7–7.7)
Neutrophils Relative %: 65 %
Platelets: 308 10*3/uL (ref 150–400)
RBC: 4.48 MIL/uL (ref 3.87–5.11)
RDW: 12.8 % (ref 11.5–15.5)
WBC: 5.6 10*3/uL (ref 4.0–10.5)
nRBC: 0 % (ref 0.0–0.2)

## 2020-04-28 MED ORDER — SODIUM CHLORIDE 0.9% FLUSH
10.0000 mL | INTRAVENOUS | Status: DC | PRN
  Administered 2020-04-28: 10 mL via INTRAVENOUS
  Filled 2020-04-28: qty 10

## 2020-04-28 MED ORDER — HEPARIN SOD (PORK) LOCK FLUSH 100 UNIT/ML IV SOLN
500.0000 [IU] | Freq: Once | INTRAVENOUS | Status: AC
  Administered 2020-04-28: 500 [IU] via INTRAVENOUS
  Filled 2020-04-28: qty 5

## 2020-04-28 MED ORDER — ANASTROZOLE 1 MG PO TABS: 1.0000 mg | ORAL_TABLET | Freq: Every day | ORAL | 4 refills | Status: DC

## 2020-04-28 NOTE — Telephone Encounter (Signed)
Called pt to remind her that she's due for repeat labs. Called pt's husband and was instructed to call pt directly. Called pt, unable to contact. LVM.

## 2020-04-28 NOTE — Assessment & Plan Note (Addendum)
#  Left breast cancer-2 separate primaries- A] T2N1-ER/PR positive HER-2 negative;  B] T1N1 ER/PR positive HER-2 negative. S/p 4 cycles of adjuvant chemotherapy Taxotere [60 mg/m] Cytoxan. currently on RT [last 9/15].   #Discussed regarding additional therapy with anastrozole.  Discussed the mechanism of action of aromatase inhibitors-with blocking of estrogen to prevent breast cancer.  Also discussed the potential side effects including but not limited to arthralgias hot flashes and increased risk of osteoporosis. Recommend ca plus vitamin D.  Will order bone density test.  Patient will start anastrozole mid October.  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  Recommend Aqaphor.  #Chronic hypokalemia/diarrhea-potassium 3.5.  STABLE;recommend Kdur 20 meq/day.   #Elevated LFTs- alkaline phosphatase-123; improved-stable.  # Insomnia/likely secondary to anxiety- on valium improved.    # port access-port flushing not drawing blood.  Recommend explantation.  Discussed with Dr. Donne Hazel; his office will contact patient.  Wants to have explanted in November.  # DISPOSITION:  #follow up in 2 months-MD; labs- cbc/cmp; port flush; BMD prior-Dr.B

## 2020-04-28 NOTE — Progress Notes (Signed)
one [place] NOTE  Patient Care Team: Tonia Ghent, [name], [name]  #  Oncology History Overview Note  # DEC 2020- LEFT BREAST-TWO PRIM ARIES:   #A]-IMC; ER/PR >90%; Her 2-IHC-2+; FISH-NEG-2 ; MRI- 3.7 CM; s/p s/p lumpectomy; s/p reexcision-final margins negative.  Stage Ib.    #B] Bx- IMC; G-1; ER/PR-pos; her2 NEG. s/p lumpectomy; margins negative [as per Dr.Wakefield; discussed at [facility] tumor conference]  # MARCH 31st 2021- START Taxotere [60 mg/m]; Cytoxan; growth factor x4 cycles; s/p radiation 04/29/2020  # MID-OCT 2021- Start anastrozole  #Extreme anxiety/chronic diarrhea/chronic severe hypokalemia/history of migraines/depression  Used estrogen and progesterone therapy: on premarin for post- menopausal x 2-3 years- discontinued in JAN 2021.   # SURVIVORSHIP:   DIAGNOSIS: Breast cancer  STAGE: IB & I     ;  GOALS: Cure  CURRENT/MOST RECENT THERAPY : Anastrozole    Carcinoma of upper-outer quadrant of left breast in female, estrogen receptor positive ([place])  08/14/2019 Initial Diagnosis   Carcinoma of upper-outer quadrant of left breast in female, estrogen receptor positive ([place])   09/10/2019 Genetic Testing   Negative genetic testing on the STAT and common hereditary cancer panel.  The Common Hereditary Gene Panel offered by [facility] includes sequencing and/or deletion duplication testing of the following 48 genes: APC, ATM, AXIN2, BARD1, BMPR1A, BRCA1, BRCA2, BRIP1, CDH1, CDK4, CDKN2A (p14ARF), CDKN2A (p16INK4a), CHEK2, CTNNA1, DICER1, EPCAM (Deletion/duplication testing only), GREM1 (promoter region deletion/duplication testing only), KIT, MEN1, MLH1, MSH2, MSH3, MSH6, MUTYH, NBN, NF1, NHTL1, PALB2, PDGFRA, PMS2, POLD1, POLE, PTEN, RAD50, RAD51C, RAD51D, RNF43, SDHB, SDHC, SDHD, SMAD4, SMARCA4. STK11, TP53, TSC1, TSC2, and  VHL.  The following genes were evaluated for sequence changes only: SDHA and HOXB13 c.251G>A variant only. The report date is September 10, 2019.   10/03/2019 Cancer Staging   Staging form: Breast, AJCC 8th Edition - Pathologic stage from 10/03/2019: Stage IB (pT2, pN1a, cM0, G2, ER+, PR+, HER2-) - Signed by Gardenia Phlegm, NP on 10/16/2019   10/09/2019 - 10/09/2019 Chemotherapy   The patient had DOXOrubicin (ADRIAMYCIN) chemo injection 98 mg, 60 mg/m2, Intravenous,  Once, 0 of 4 cycles PALONOSETRON HCL INJECTION 0.25 MG/5ML, 0.25 mg, Intravenous,  Once, 0 of 4 cycles pegfilgrastim-jmdb (FULPHILA) injection 6 mg, 6 mg, Subcutaneous,  Once, 0 of 4 cycles cyclophosphamide (CYTOXAN) 980 mg in sodium chloride 0.9 % 250 mL chemo infusion, 600 mg/m2, Intravenous,  Once, 0 of 4 cycles PACLitaxel (TAXOL) 132 mg in sodium chloride 0.9 % 250 mL chemo infusion (</= 86m/m2), 80 mg/m2, Intravenous,  Once, 0 of 12 cycles FOSAPREPITANT IV INFUSION 150 MG, 150 mg, Intravenous,  Once, 0 of 4 cycles  for chemotherapy treatment.    11/13/2019 -  Chemotherapy   The patient had palonosetron (ALOXI) injection 0.25 mg, 0.25 mg, Intravenous,  Once, 4 of 4 cycles Administration: 0.25 mg (11/13/2019), 0.25 mg (12/04/2019), 0.25 mg (12/25/2019), 0.25 mg (01/15/2020) pegfilgrastim-jmdb (FULPHILA) injection 6 mg, 6 mg, Subcutaneous,  Once, 4 of 4 cycles Administration: 6 mg (11/14/2019), 6 mg (12/05/2019), 6 mg (12/26/2019), 6 mg (01/16/2020) cyclophosphamide (CYTOXAN) 1,000 mg in sodium chloride 0.9 % 250 mL chemo infusion, 980 mg, Intravenous,  Once, 4 of 4 cycles Administration: 1,000 mg (11/13/2019), 1,000 mg (12/04/2019), 1,000 mg (12/25/2019), 1,000 mg (01/15/2020) DOCEtaxel (TAXOTERE) 100 mg in sodium chloride 0.9 % 250 mL chemo infusion, 60 mg/m2 = 100 mg (100 %  of original dose 60 mg/m2), Intravenous,  Once, 4 of 4 cycles Dose modification: 60 mg/m2 (original dose 60 mg/m2, Cycle 1, Reason: Provider Judgment) Administration:  100 mg (11/13/2019), 100 mg (12/04/2019), 100 mg (12/25/2019), 100 mg (01/15/2020)  for chemotherapy treatment.       HISTORY OF PRESENTING ILLNESS:  Jessica Barnes 59 y.o.  female with of breast cancer  [T2N1-ER/PR positive HER-2 negative; T1N1-ER/PR positive HER-2 negative 2 separate primaries] s/p adjuvant chemotherapy is here for follow-up.  Patient is currently undergoing radiation; will finish radiation tomorrow.  Complains of mild rash from the radiation.  Itching.  Otherwise denies any new lumps or bumps.  Appetite is good.  Chronic mild diarrhea not any worse.  No nausea no vomiting.   Review of Systems  Constitutional: Positive for malaise/fatigue. Negative for chills, diaphoresis, fever and weight loss.  HENT: Negative for nosebleeds and sore throat.   Eyes: Negative for double vision.  Respiratory: Negative for cough, hemoptysis, sputum production and wheezing.   Cardiovascular: Negative for chest pain, palpitations, orthopnea and leg swelling.  Gastrointestinal: Negative for abdominal pain, blood in stool, constipation, heartburn, melena, nausea and vomiting.  Genitourinary: Negative for dysuria, frequency and urgency.  Musculoskeletal: Negative for back pain and joint pain.  Skin: Positive for itching and rash.  Neurological: Positive for headaches. Negative for dizziness, tingling, focal weakness and weakness.  Endo/Heme/Allergies: Does not bruise/bleed easily.  Psychiatric/Behavioral: Positive for depression. The patient is nervous/anxious and has insomnia.      MEDICAL HISTORY:  Past Medical History:  Diagnosis Date  . Anxiety 10/13/96  . Chest pain    atypical, hosp for that and depression 8/10-8/11/08  . COPD (chronic obstructive pulmonary disease) ([place]) 09/15/05   by x-ray  . Depression 10/13/96   hospital 8/10-8/11/08.  notable history: son with TBI after MVA  . Family history of breast cancer   . Family history of colon cancer   . Family history of  pancreatic cancer   . Hyperlipidemia 09/15/98  . Hypothyroidism 01/14/96  . Migraine    27 yoa  . NSVD (normal spontaneous vaginal delivery)    x 1  . PONV (postoperative nausea and vomiting)   . PTSD (post-traumatic stress disorder)    likely PTSD after son's MVA/TBI in 2007, s/p counseling    SURGICAL HISTORY: Past Surgical History:  Procedure Laterality Date  . APPENDECTOMY     59 years of age  . BREAST BIOPSY Left 08/06/2019   [place] bx/ ribbon clip/ path pending  . BREAST BIOPSY Left 09/06/2019   [place] path pending heart clip  . BREAST LUMPECTOMY WITH RADIOACTIVE SEED AND SENTINEL LYMPH NODE BIOPSY Left 10/01/2019   Procedure: LEFT BREAST LUMPECTOMY WITH BRACKETED RADIOACTIVE SEEDS AND LEFT AXILLARY SENTINEL LYMPH NODE BIOPSY;  Surgeon: Rolm Bookbinder, MD;  Location: [facility];  Service: General;  Laterality: Left;  . PORTACATH PLACEMENT Right 10/28/2019   Procedure: INSERTION PORT-A-CATH WITH ULTRASOUND GUIDANCE;  Surgeon: Rolm Bookbinder, MD;  Location: [facility];  Service: General;  Laterality: Right;  . RE-EXCISION OF BREAST LUMPECTOMY Left 10/28/2019   Procedure: RE-EXCISION OF LEFT BREAST LUMPECTOMY;  Surgeon: Rolm Bookbinder, MD;  Location: [facility];  Service: General;  Laterality: Left;    SOCIAL HISTORY: Social History   Socioeconomic History  . Marital status: Married    Spouse name: Not on file  . Number of children: Not on file  . Years of education: Not on file  . Highest education level:  Not on file  Occupational History  . Not on file  Tobacco Use  . Smoking status: Former Smoker    Packs/day: 0.50    Years: 8.00    Pack years: 4.00    Types: Cigarettes  . Smokeless tobacco: Never Used  . Tobacco comment: quit 04/15/2014  Vaping Use  . Vaping Use: Every day  Substance and Sexual Activity  . Alcohol use: No    Alcohol/week: 0.0 standard drinks  . Drug use: No  . Sexual activity: Not on file  Other  Topics Concern  . Not on file  Social History Narrative   Remarried, lives with husband 1 son, he had TBI and short term memory loss after prolonged illness      #Patient lives in [place] with her husband; prior history of smoking/currently vapes.  No significant alcohol use.  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    Social Determinants of Health   Financial Resource Strain:   . Difficulty of Paying Living Expenses: Not on file  Food Insecurity:   . Worried About Charity fundraiser in the Last Year: Not on file  . Ran Out of Food in the Last Year: Not on file  Transportation Needs:   . Lack of Transportation (Medical): Not on file  . Lack of Transportation (Non-Medical): Not on file  Physical Activity:   . Days of Exercise per Week: Not on file  . Minutes of Exercise per Session: Not on file  Stress:   . Feeling of Stress : Not on file  Social Connections:   . Frequency of Communication with Friends and Family: Not on file  . Frequency of Social Gatherings with Friends and Family: Not on file  . Attends Religious Services: Not on file  . Active Member of Clubs or Organizations: Not on file  . Attends Archivist Meetings: Not on file  . Marital Status: Not on file  Intimate Partner Violence:   . Fear of Current or Ex-Partner: Not on file  . Emotionally Abused: Not on file  . Physically Abused: Not on file  . Sexually Abused: Not on file    FAMILY HISTORY: Family History  Problem Relation Age of Onset  . Hypertension Mother   . Colon cancer Father        d. 12  . Thyroid disease Sister   . Breast cancer Paternal Aunt   . Breast cancer Paternal Grandmother   . Colon cancer Paternal Grandmother   . Breast cancer Cousin        paternal aunt's daughter  . Colon cancer Paternal Uncle        3 pat uncles with colon cancer at unknown ages  . Pancreatic cancer Maternal Uncle        d. 53  . Breast cancer Paternal Aunt     ALLERGIES:  is allergic to  sulfamethoxazole-trimethoprim and codeine.  MEDICATIONS:  Current Outpatient Medications  Medication Sig Dispense Refill  . acetaminophen (TYLENOL) 500 MG tablet Take 1,000 mg by mouth every 6 (six) hours as needed for mild pain or headache.    . butalbital-acetaminophen-caffeine (FIORICET) 50-325-40 MG tablet TAKE 1 TABLET BY MOUTH EVERY 4 TO 6 HOURS AS NEEDED FOR HEADACHE 90 tablet 3  . FLUoxetine (PROZAC) 20 MG tablet Take 60 mg by mouth daily.     Marland Kitchen levothyroxine (SYNTHROID) 88 MCG tablet TAKE 1 TABLET BY MOUTH DAILY EXCEPT ON SUNDAYS TAKE 1/2 TABLET 90 tablet 0  . potassium chloride SA (  KLOR-CON) 20 MEQ tablet TAKE 1 TABLET BY MOUTH TWICE A DAY (Patient taking differently: Take 20 mEq by mouth daily. TAKE 1 TABLET BY MOUTH TWICE A DAY) 60 tablet 3  . anastrozole (ARIMIDEX) 1 MG tablet Take 1 tablet (1 mg total) by mouth daily. Start it in MID-October, 2021. 30 tablet 4  . diazepam (VALIUM) 10 MG tablet Take 1 tablet (10 mg total) by mouth at bedtime as needed for anxiety. (Patient not taking: Reported on 04/28/2020) 30 tablet 0  . dimenhyDRINATE (DRAMAMINE) 50 MG tablet Take 50 mg by mouth every 8 (eight) hours as needed. (Patient not taking: Reported on 04/28/2020)    . FLUoxetine (PROZAC) 20 MG capsule TAKE 3 CAPSULES BY MOUTH DAILY (Patient not taking: Reported on 04/28/2020) 270 capsule 1  . hydrOXYzine (ATARAX/VISTARIL) 25 MG tablet Take 1 tablet (25 mg total) by mouth 3 (three) times daily as needed. (Patient not taking: Reported on 04/28/2020) 30 tablet 0  . ibuprofen (ADVIL) 100 MG tablet Take 100 mg by mouth every 6 (six) hours as needed for fever. (Patient not taking: Reported on 04/28/2020)    . lidocaine-prilocaine (EMLA) cream Apply 1 application topically as needed. (Patient not taking: Reported on 04/28/2020) 30 g 0  . loperamide (IMODIUM A-D) 2 MG tablet Take 4 mg by mouth 4 (four) times daily as needed for diarrhea or loose stools. (Patient not taking: Reported on 04/28/2020)    .  ondansetron (ZOFRAN) 8 MG tablet One pill every 8 hours as needed for nausea/vomitting. (Patient not taking: Reported on 04/28/2020) 40 tablet 1   No current facility-administered medications for this visit.   Facility-Administered Medications Ordered in Other Visits  Medication Dose Route Frequency Provider Last Rate Last Admin  . sodium chloride flush (NS) 0.9 % injection 10 mL  10 mL Intravenous PRN Cammie Sickle, MD   10 mL at 04/28/20 1020      .  PHYSICAL EXAMINATION: ECOG PERFORMANCE STATUS: 0 - Asymptomatic  Vitals:   04/28/20 1036  BP: 128/68  Pulse: 90  Resp: 18  Temp: (!) 95.2 F (35.1 C)   Filed Weights   04/28/20 1015  Weight: 138 lb 8 oz (62.8 kg)    Physical Exam Constitutional:      Comments: Accompanied by husband.  HENT:     Head: Normocephalic and atraumatic.     Mouth/Throat:     Pharynx: No oropharyngeal exudate.  Eyes:     Pupils: Pupils are equal, round, and reactive to light.  Cardiovascular:     Rate and Rhythm: Normal rate and regular rhythm.  Pulmonary:     Effort: Pulmonary effort is normal. No respiratory distress.     Breath sounds: Normal breath sounds. No wheezing.  Abdominal:     General: Bowel sounds are normal. There is no distension.     Palpations: Abdomen is soft. There is no mass.     Tenderness: There is no abdominal tenderness. There is no guarding or rebound.  Musculoskeletal:        General: No tenderness. Normal range of motion.     Cervical back: Normal range of motion and neck supple.  Skin:    General: Skin is warm.     Comments: Macular rash in the radiation portal.  No desquamation.  Neurological:     Mental Status: She is alert and oriented to person, place, and time.  Psychiatric:        Mood and Affect: Affect normal.  Comments: Anxious    LABORATORY DATA:  I have reviewed the data as listed Lab Results  Component Value Date   WBC 5.6 04/28/2020   HGB 12.8 04/28/2020   HCT 38.6 04/28/2020    MCV 86.2 04/28/2020   PLT 308 04/28/2020   Recent Labs    08/14/19 1544 09/06/19 1054 10/10/19 1325 11/06/19 0950 01/15/20 0846 02/25/20 1029 04/28/20 1011  NA  --   --   --    < > 137 136 138  K  --   --   --    < > 3.8 4.0 3.5  CL  --   --   --    < > 105 106 108  CO2  --   --   --    < > 20* 23 21*  GLUCOSE  --   --   --    < > 198* 96 112*  BUN  --   --   --    < > 14 13 14   CREATININE  --   --   --    < > 0.76 0.87 0.77  CALCIUM   < >  --  8.6*   < > 9.4 8.7* 9.0  GFRNONAA  --   --   --    < > >60 >60 >60  GFRAA  --   --   --    < > >60 >60 >60  PROT  --  7.6 6.5   < > 7.5 6.8 7.1  ALBUMIN  --  3.8 4.0   < > 4.2 3.9 3.9  AST   < > 27 23   < > 27 17 20   ALT   < > 18 21   < > 28 14 17   ALKPHOS   < > 174* 200*   < > 105 118 104  BILITOT  --  0.4 <0.2   < > 0.7 0.4 0.5  BILIDIR  --  <0.1 0.07  --   --   --   --   IBILI  --  NOT CALCULATED  --   --   --   --   --    < > = values in this interval not displayed.    RADIOGRAPHIC STUDIES: I have personally reviewed the radiological images as listed and agreed with the findings in the report. No results found.  ASSESSMENT & PLAN:   Carcinoma of upper-outer quadrant of left breast in female, estrogen receptor positive ([place]) # Left breast cancer-2 separate primaries- A] T2N1-ER/PR positive HER-2 negative;  B] T1N1 ER/PR positive HER-2 negative. S/p 4 cycles of adjuvant chemotherapy Taxotere [60 mg/m] Cytoxan. currently on RT [last 9/15].   #Discussed regarding additional therapy with anastrozole.  Discussed the mechanism of action of aromatase inhibitors-with blocking of estrogen to prevent breast cancer.  Also discussed the potential side effects including but not limited to arthralgias hot flashes and increased risk of osteoporosis. Recommend ca plus vitamin D.  Will order bone density test.  Patient will start anastrozole mid October.  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  Recommend Aqaphor.  #Chronic  hypokalemia/diarrhea-potassium 3.5.  STABLE;recommend Kdur 20 meq/day.   #Elevated LFTs- alkaline phosphatase-123; improved-stable.  # Insomnia/likely secondary to anxiety- on valium improved.    # port access-port flushing not drawing blood.  Recommend explantation.  Discussed with Dr. Donne Hazel; his office will contact patient.  Wants to have explanted in November.  # DISPOSITION:  #follow up in 2 months-MD; labs- cbc/cmp;  port flush; BMD prior-Dr.B  All questions were answered. The patient/family knows to call the clinic with any problems, questions or concerns.    Cammie Sickle, MD 04/28/2020 11:53 AM

## 2020-04-28 NOTE — Telephone Encounter (Signed)
-----   Message from Rushie Chestnut, [place] sent at 11/07/2019  3:22 PM EDT ----- Regarding: Pt is due for repeat labs Pt is due for repeat LFT and GGT lab tests (refer to 11-07-19 progress note)

## 2020-04-29 ENCOUNTER — Ambulatory Visit
Admission: RE | Admit: 2020-04-29 | Discharge: 2020-04-29 | Disposition: A | Discharge status: Home / Self Care | Payer: No Typology Code available for payment source | Source: Ambulatory Visit | Attending: Radiation Oncology | Admitting: Radiation Oncology

## 2020-04-29 DIAGNOSIS — C50412 Malignant neoplasm of upper-outer quadrant of left female breast: Secondary | ICD-10-CM | POA: Diagnosis not present

## 2020-05-19 ENCOUNTER — Telehealth: Payer: Self-pay

## 2020-05-19 NOTE — Telephone Encounter (Signed)
Pt called in stating she knows she needs to be seen but is still having treatment for breast cancer and is afraid of coming in the office with Covid. Asking if she can do a virtual visit and possibly do blood draw at the car. Her immune system is very weak. Please advise.

## 2020-05-20 ENCOUNTER — Ambulatory Visit
Admission: RE | Admit: 2020-05-20 | Discharge: 2020-05-20 | Disposition: A | Discharge status: Home / Self Care | Payer: No Typology Code available for payment source | Source: Ambulatory Visit | Attending: Internal Medicine | Admitting: Internal Medicine

## 2020-05-20 ENCOUNTER — Other Ambulatory Visit: Payer: Self-pay

## 2020-05-20 DIAGNOSIS — M810 Age-related osteoporosis without current pathological fracture: Secondary | ICD-10-CM

## 2020-05-20 NOTE — Telephone Encounter (Signed)
Please schedule 30-minute video versus phone visit when possible.  We can set the labs up after that.  Thanks.

## 2020-05-26 ENCOUNTER — Other Ambulatory Visit: Payer: Self-pay | Admitting: Internal Medicine

## 2020-05-27 NOTE — Telephone Encounter (Signed)
Pt has been scheduled.  °

## 2020-06-01 ENCOUNTER — Ambulatory Visit: Payer: No Typology Code available for payment source | Admitting: Radiation Oncology

## 2020-06-01 ENCOUNTER — Encounter (HOSPITAL_COMMUNITY): Payer: Self-pay

## 2020-06-17 ENCOUNTER — Other Ambulatory Visit: Payer: Self-pay | Admitting: Family Medicine

## 2020-06-17 NOTE — Telephone Encounter (Signed)
Okay to continue.  Sent.  Thanks. 

## 2020-06-17 NOTE — Telephone Encounter (Signed)
Refill request Prozac Last office visit 07/29/19 Last refill 11/08/19 #270/1 Upcoming appointment scheduled 07/02/20 See drug interaction with Ibuprofen Okay to refill?

## 2020-06-26 ENCOUNTER — Other Ambulatory Visit: Payer: Self-pay | Admitting: *Deleted

## 2020-06-26 ENCOUNTER — Other Ambulatory Visit: Payer: Self-pay | Admitting: Internal Medicine

## 2020-06-26 ENCOUNTER — Telehealth: Payer: Self-pay

## 2020-06-26 DIAGNOSIS — Z170 Estrogen receptor positive status [ER+]: Secondary | ICD-10-CM

## 2020-06-26 DIAGNOSIS — C50412 Malignant neoplasm of upper-outer quadrant of left female breast: Secondary | ICD-10-CM

## 2020-06-29 ENCOUNTER — Inpatient Hospital Stay (HOSPITAL_BASED_OUTPATIENT_CLINIC_OR_DEPARTMENT_OTHER): Payer: No Typology Code available for payment source | Admitting: Internal Medicine

## 2020-06-29 ENCOUNTER — Inpatient Hospital Stay: Payer: No Typology Code available for payment source | Attending: Internal Medicine

## 2020-06-29 ENCOUNTER — Ambulatory Visit
Admission: RE | Admit: 2020-06-29 | Discharge: 2020-06-29 | Disposition: A | Discharge status: Home / Self Care | Payer: No Typology Code available for payment source | Source: Ambulatory Visit | Attending: Radiation Oncology | Admitting: Radiation Oncology

## 2020-06-29 ENCOUNTER — Encounter: Payer: Self-pay | Admitting: Internal Medicine

## 2020-06-29 DIAGNOSIS — F419 Anxiety disorder, unspecified: Secondary | ICD-10-CM | POA: Insufficient documentation

## 2020-06-29 DIAGNOSIS — E876 Hypokalemia: Secondary | ICD-10-CM | POA: Diagnosis not present

## 2020-06-29 DIAGNOSIS — E039 Hypothyroidism, unspecified: Secondary | ICD-10-CM | POA: Insufficient documentation

## 2020-06-29 DIAGNOSIS — Z170 Estrogen receptor positive status [ER+]: Secondary | ICD-10-CM | POA: Insufficient documentation

## 2020-06-29 DIAGNOSIS — Z803 Family history of malignant neoplasm of breast: Secondary | ICD-10-CM | POA: Diagnosis not present

## 2020-06-29 DIAGNOSIS — M79603 Pain in arm, unspecified: Secondary | ICD-10-CM | POA: Diagnosis not present

## 2020-06-29 DIAGNOSIS — C50412 Malignant neoplasm of upper-outer quadrant of left female breast: Secondary | ICD-10-CM

## 2020-06-29 DIAGNOSIS — Z800 Family history of malignant neoplasm of digestive organs: Secondary | ICD-10-CM | POA: Insufficient documentation

## 2020-06-29 DIAGNOSIS — G4700 Insomnia, unspecified: Secondary | ICD-10-CM | POA: Diagnosis not present

## 2020-06-29 DIAGNOSIS — Z87891 Personal history of nicotine dependence: Secondary | ICD-10-CM | POA: Insufficient documentation

## 2020-06-29 DIAGNOSIS — E785 Hyperlipidemia, unspecified: Secondary | ICD-10-CM | POA: Insufficient documentation

## 2020-06-29 DIAGNOSIS — R748 Abnormal levels of other serum enzymes: Secondary | ICD-10-CM | POA: Insufficient documentation

## 2020-06-29 DIAGNOSIS — Z923 Personal history of irradiation: Secondary | ICD-10-CM | POA: Diagnosis not present

## 2020-06-29 DIAGNOSIS — R5383 Other fatigue: Secondary | ICD-10-CM | POA: Diagnosis not present

## 2020-06-29 DIAGNOSIS — Z79811 Long term (current) use of aromatase inhibitors: Secondary | ICD-10-CM | POA: Insufficient documentation

## 2020-06-29 DIAGNOSIS — K5900 Constipation, unspecified: Secondary | ICD-10-CM | POA: Insufficient documentation

## 2020-06-29 DIAGNOSIS — R599 Enlarged lymph nodes, unspecified: Secondary | ICD-10-CM | POA: Diagnosis not present

## 2020-06-29 DIAGNOSIS — F418 Other specified anxiety disorders: Secondary | ICD-10-CM | POA: Diagnosis not present

## 2020-06-29 DIAGNOSIS — Z9221 Personal history of antineoplastic chemotherapy: Secondary | ICD-10-CM | POA: Insufficient documentation

## 2020-06-29 DIAGNOSIS — R197 Diarrhea, unspecified: Secondary | ICD-10-CM | POA: Diagnosis not present

## 2020-06-29 DIAGNOSIS — Z79899 Other long term (current) drug therapy: Secondary | ICD-10-CM | POA: Insufficient documentation

## 2020-06-29 DIAGNOSIS — J449 Chronic obstructive pulmonary disease, unspecified: Secondary | ICD-10-CM | POA: Insufficient documentation

## 2020-06-29 DIAGNOSIS — F4310 Post-traumatic stress disorder, unspecified: Secondary | ICD-10-CM | POA: Insufficient documentation

## 2020-06-29 LAB — CBC WITH DIFFERENTIAL/PLATELET
Abs Immature Granulocytes: 0.02 10*3/uL (ref 0.00–0.07)
Basophils Absolute: 0.1 10*3/uL (ref 0.0–0.1)
Basophils Relative: 1 %
Eosinophils Absolute: 0.4 10*3/uL (ref 0.0–0.5)
Eosinophils Relative: 6 %
HCT: 36.6 % (ref 36.0–46.0)
Hemoglobin: 11.9 g/dL — ABNORMAL LOW (ref 12.0–15.0)
Immature Granulocytes: 0 %
Lymphocytes Relative: 24 %
Lymphs Abs: 1.5 10*3/uL (ref 0.7–4.0)
MCH: 29.5 pg (ref 26.0–34.0)
MCHC: 32.5 g/dL (ref 30.0–36.0)
MCV: 90.6 fL (ref 80.0–100.0)
Monocytes Absolute: 0.5 10*3/uL (ref 0.1–1.0)
Monocytes Relative: 8 %
Neutro Abs: 3.8 10*3/uL (ref 1.7–7.7)
Neutrophils Relative %: 61 %
Platelets: 275 10*3/uL (ref 150–400)
RBC: 4.04 MIL/uL (ref 3.87–5.11)
RDW: 15.7 % — ABNORMAL HIGH (ref 11.5–15.5)
WBC: 6.3 10*3/uL (ref 4.0–10.5)
nRBC: 0 % (ref 0.0–0.2)

## 2020-06-29 LAB — COMPREHENSIVE METABOLIC PANEL
ALT: 17 U/L (ref 0–44)
AST: 19 U/L (ref 15–41)
Albumin: 3.7 g/dL (ref 3.5–5.0)
Alkaline Phosphatase: 105 U/L (ref 38–126)
Anion gap: 6 (ref 5–15)
BUN: 17 mg/dL (ref 6–20)
CO2: 24 mmol/L (ref 22–32)
Calcium: 9 mg/dL (ref 8.9–10.3)
Chloride: 105 mmol/L (ref 98–111)
Creatinine, Ser: 0.8 mg/dL (ref 0.44–1.00)
GFR, Estimated: >60 mL/min (ref >60)
Glucose, Bld: 111 mg/dL — ABNORMAL HIGH (ref 70–99)
Potassium: 3.8 mmol/L (ref 3.5–5.1)
Sodium: 135 mmol/L (ref 135–145)
Total Bilirubin: 0.7 mg/dL (ref 0.3–1.2)
Total Protein: 7 g/dL (ref 6.5–8.1)

## 2020-06-29 NOTE — Progress Notes (Signed)
Radiation Oncology Follow up Note  Name: Jessica Barnes   Date:   06/29/2020 MRN:  [id number] DOB: 22-Apr-1961    This 59 y.o. female presents to the clinic today for 51-month follow-up status post whole breast radiation for stage Ib invasive mammary carcinoma left breast.  She had 2 separate foci's status post wide local excision as well as adjuvant chemotherapy for stage Ib (T2N1 a M0).  REFERRING PROVIDER: Tonia Ghent, MD  HPI: Patient is a 59 year old female now out 2 months having completed whole breast and peripheral lymphatic radiation therapy for ER positive stage Ib invasive mammary carcinoma of the left breast seen today in routine follow-up she is doing well she specifically denies breast tenderness cough or bone pain..  She is currently on Arimidex tolerant well without side effect  COMPLICATIONS OF TREATMENT: none  FOLLOW UP COMPLIANCE: keeps appointments   PHYSICAL EXAM:  LMP 11/30/2011  Lungs are clear to A&P cardiac examination essentially unremarkable with regular rate and rhythm. No dominant mass or nodularity is noted in either breast in 2 positions examined. Incision is well-healed. No axillary or supraclavicular adenopathy is appreciated. Cosmetic result is excellent.  Well-developed well-nourished patient in NAD. HEENT reveals PERLA, EOMI, discs not visualized.  Oral cavity is clear. No oral mucosal lesions are identified. Neck is clear without evidence of cervical or supraclavicular adenopathy. Lungs are clear to A&P. Cardiac examination is essentially unremarkable with regular rate and rhythm without murmur rub or thrill. Abdomen is benign with no organomegaly or masses noted. Motor sensory and DTR levels are equal and symmetric in the upper and lower extremities. Cranial nerves II through XII are grossly intact. Proprioception is intact. No peripheral adenopathy or edema is identified. No motor or sensory levels are noted. Crude visual fields are within  normal range.  RADIOLOGY RESULTS: No current films to review  PLAN: Present time patient is doing well 2 months out from whole breast radiation and pleased with her overall progress.  She continues on Arimidex without side effect.  I have asked to see her back in 4 to 5 months for follow-up.  Patient knows to call with any concerns.  I would like to take this opportunity to thank you for allowing me to participate in the care of your patient.Noreene Filbert, MD

## 2020-06-29 NOTE — Assessment & Plan Note (Addendum)
#  Left breast cancer-2 separate primaries- A] T2N1-ER/PR positive HER-2 negative;  B] T1N1 ER/PR positive HER-2 negative. S/p 4 cycles of adjuvant chemotherapy Taxotere [60 mg/m] Cytoxan. S/p  RT [last 9/15].   #Recommend starting anastrozole.  Again reviewed the postmenopausal symptoms including but not limited to hot flashes/joint pains etc.  Patient is nervous but willing to try.  # Chronic hypokalemia/diarrhea-potassium 3.5.  STABLE; Recommend continue Kdur 20 meq/day.   # Elevated LFTs- alkaline phosphatase-123; improved-STABLE.   # BMD- T score= 0.9; continue Vit D [ca-constipation]  # Insomnia/likely secondary to anxiety- on valium improved.  Continue Valium.  # DISPOSITION:  #follow up in 1 months-MD;no labs--Dr.B

## 2020-06-29 NOTE — Progress Notes (Signed)
[facility] NOTE  Patient Care Team: Tonia Ghent, MD as PCP - General (Family Medicine) Cammie Sickle, MD as Consulting Physician (Internal Medicine) Rolm Bookbinder, MD as Consulting Physician (General Surgery) Rico Junker, [name]  #  Oncology History Overview Note  # DEC 2020- LEFT BREAST-TWO PRIM ARIES:   #A]-IMC; ER/PR >90%; Her 2-IHC-2+; FISH-NEG-2 ; MRI- 3.7 CM; s/p s/p lumpectomy; s/p reexcision-final margins negative.  Stage Ib.    #B] Bx- IMC; G-1; ER/PR-pos; her2 NEG. s/p lumpectomy; margins negative [as per Dr.Wakefield; discussed at [facility] tumor conference]  # MARCH 31st 2021- START Taxotere [60 mg/m]; Cytoxan; growth factor x4 cycles; s/p radiation 04/29/2020  # MID-NOV 2021- Start anastrozole  #Extreme anxiety/chronic diarrhea/chronic severe hypokalemia/history of migraines/depression  Used estrogen and progesterone therapy: on premarin for post- menopausal x 2-3 years- discontinued in JAN 2021.   # SURVIVORSHIP:   DIAGNOSIS: Breast cancer  STAGE: IB & I     ;  GOALS: Cure  CURRENT/MOST RECENT THERAPY : Anastrozole    Carcinoma of upper-outer quadrant of left breast in female, estrogen receptor positive ([place])  08/14/2019 Initial Diagnosis   Carcinoma of upper-outer quadrant of left breast in female, estrogen receptor positive ([place])   09/10/2019 Genetic Testing   Negative genetic testing on the STAT and common hereditary cancer panel.  The Common Hereditary Gene Panel offered by [facility] includes sequencing and/or deletion duplication testing of the following 48 genes: APC, ATM, AXIN2, BARD1, BMPR1A, BRCA1, BRCA2, BRIP1, CDH1, CDK4, CDKN2A (p14ARF), CDKN2A (p16INK4a), CHEK2, CTNNA1, DICER1, EPCAM (Deletion/duplication testing only), GREM1 (promoter region deletion/duplication testing only), KIT, MEN1, MLH1, MSH2, MSH3, MSH6, MUTYH, NBN, NF1,  NHTL1, PALB2, PDGFRA, PMS2, POLD1, POLE, PTEN, RAD50, RAD51C, RAD51D, RNF43, SDHB, SDHC, SDHD, SMAD4, SMARCA4. STK11, TP53, TSC1, TSC2, and VHL.  The following genes were evaluated for sequence changes only: SDHA and HOXB13 c.251G>A variant only. The report date is September 10, 2019.   10/03/2019 Cancer Staging   Staging form: Breast, AJCC 8th Edition - Pathologic stage from 10/03/2019: Stage IB (pT2, pN1a, cM0, G2, ER+, PR+, HER2-) - Signed by Gardenia Phlegm, NP on 10/16/2019   10/09/2019 - 10/09/2019 Chemotherapy   The patient had dexamethasone (DECADRON) 4 MG tablet, 0 of 1 cycle, Start date: --, End date: -- DOXOrubicin (ADRIAMYCIN) chemo injection 98 mg, 60 mg/m2, Intravenous,  Once, 0 of 4 cycles PALONOSETRON HCL INJECTION 0.25 MG/5ML, 0.25 mg, Intravenous,  Once, 0 of 4 cycles pegfilgrastim-jmdb (FULPHILA) injection 6 mg, 6 mg, Subcutaneous,  Once, 0 of 4 cycles cyclophosphamide (CYTOXAN) 980 mg in sodium chloride 0.9 % 250 mL chemo infusion, 600 mg/m2, Intravenous,  Once, 0 of 4 cycles PACLitaxel (TAXOL) 132 mg in sodium chloride 0.9 % 250 mL chemo infusion (</= 32m/m2), 80 mg/m2, Intravenous,  Once, 0 of 12 cycles FOSAPREPITANT IV INFUSION 150 MG, 150 mg, Intravenous,  Once, 0 of 4 cycles  for chemotherapy treatment.    11/13/2019 -  Chemotherapy   The patient had dexamethasone (DECADRON) 4 MG tablet, 8 mg, Oral, 2 times daily, 1 of 1 cycle, Start date: --, End date: -- palonosetron (ALOXI) injection 0.25 mg, 0.25 mg, Intravenous,  Once, 4 of 4 cycles Administration: 0.25 mg (11/13/2019), 0.25 mg (12/04/2019), 0.25 mg (12/25/2019), 0.25 mg (01/15/2020) pegfilgrastim-jmdb (FULPHILA) injection 6 mg, 6 mg, Subcutaneous,  Once, 4 of 4 cycles Administration: 6 mg (11/14/2019), 6 mg (12/05/2019), 6 mg (12/26/2019), 6 mg (01/16/2020) cyclophosphamide (CYTOXAN)  1,000 mg in sodium chloride 0.9 % 250 mL chemo infusion, 980 mg, Intravenous,  Once, 4 of 4 cycles Administration: 1,000 mg (11/13/2019),  1,000 mg (12/04/2019), 1,000 mg (12/25/2019), 1,000 mg (01/15/2020) DOCEtaxel (TAXOTERE) 100 mg in sodium chloride 0.9 % 250 mL chemo infusion, 60 mg/m2 = 100 mg (100 % of original dose 60 mg/m2), Intravenous,  Once, 4 of 4 cycles Dose modification: 60 mg/m2 (original dose 60 mg/m2, Cycle 1, Reason: Provider Judgment) Administration: 100 mg (11/13/2019), 100 mg (12/04/2019), 100 mg (12/25/2019), 100 mg (01/15/2020)  for chemotherapy treatment.       HISTORY OF PRESENTING ILLNESS:  Jessica Barnes 59 y.o.  female with of breast cancer  [T2N1-ER/PR positive HER-2 negative; T1N1-ER/PR positive HER-2 negative 2 separate primaries] s/p adjuvant chemotherapy; also s/p adjuvant radiation is here for follow-up.  In the interim underwent port explantation.  Patient finished radiation in September negative.  Patient did not start anastrozole as recommended in mid October.  She was concerned about side effects.  Complains of fatigue.  Otherwise denies any new lumps or bumps.  No nausea vomiting.  Chronic mild diarrhea.  Not any worse.   Review of Systems  Constitutional: Positive for malaise/fatigue. Negative for chills, diaphoresis, fever and weight loss.  HENT: Negative for nosebleeds and sore throat.   Eyes: Negative for double vision.  Respiratory: Negative for cough, hemoptysis, sputum production and wheezing.   Cardiovascular: Negative for chest pain, palpitations, orthopnea and leg swelling.  Gastrointestinal: Negative for abdominal pain, blood in stool, constipation, heartburn, melena, nausea and vomiting.  Genitourinary: Negative for dysuria, frequency and urgency.  Musculoskeletal: Negative for back pain and joint pain.  Neurological: Negative for dizziness, tingling, focal weakness and weakness.  Endo/Heme/Allergies: Does not bruise/bleed easily.  Psychiatric/Behavioral: The patient is nervous/anxious and has insomnia.      MEDICAL HISTORY:  Past Medical History:  Diagnosis Date   . Anxiety 10/13/96  . Chest pain    atypical, hosp for that and depression 8/10-8/11/08  . COPD (chronic obstructive pulmonary disease) ([place]) 09/15/05   by x-ray  . Depression 10/13/96   hospital 8/10-8/11/08.  notable history: son with TBI after MVA  . Family history of breast cancer   . Family history of colon cancer   . Family history of pancreatic cancer   . Hyperlipidemia 09/15/98  . Hypothyroidism 01/14/96  . Migraine    27 yoa  . NSVD (normal spontaneous vaginal delivery)    x 1  . PONV (postoperative nausea and vomiting)   . PTSD (post-traumatic stress disorder)    likely PTSD after son's MVA/TBI in 2007, s/p counseling    SURGICAL HISTORY: Past Surgical History:  Procedure Laterality Date  . APPENDECTOMY     59 years of age  . BREAST BIOPSY Left 08/06/2019   [place] bx/ ribbon clip/ invasive mammary carcinoma  . BREAST BIOPSY Left 09/06/2019   [place] bx, heart clip, invasive mammary carcinoma   . BREAST LUMPECTOMY    . BREAST LUMPECTOMY WITH RADIOACTIVE SEED AND SENTINEL LYMPH NODE BIOPSY Left 10/01/2019   Procedure: LEFT BREAST LUMPECTOMY WITH BRACKETED RADIOACTIVE SEEDS AND LEFT AXILLARY SENTINEL LYMPH NODE BIOPSY;  Surgeon: Rolm Bookbinder, MD;  Location: [place];  Service: General;  Laterality: Left;  . PORTACATH PLACEMENT Right 10/28/2019   Procedure: INSERTION PORT-A-CATH WITH ULTRASOUND GUIDANCE;  Surgeon: Rolm Bookbinder, MD;  Location: [place];  Service: General;  Laterality: Right;  . RE-EXCISION OF BREAST LUMPECTOMY Left 10/28/2019   Procedure: RE-EXCISION  OF LEFT BREAST LUMPECTOMY;  Surgeon: Rolm Bookbinder, MD;  Location: [place];  Service: General;  Laterality: Left;    SOCIAL HISTORY: Social History   Socioeconomic History  . Marital status: Married    Spouse name: Not on file  . Number of children: Not on file  . Years of education: Not on file  . Highest education level: Not on file  Occupational  History  . Not on file  Tobacco Use  . Smoking status: Former Smoker    Packs/day: 0.50    Years: 8.00    Pack years: 4.00    Types: Cigarettes  . Smokeless tobacco: Never Used  . Tobacco comment: quit 04/15/2014  Vaping Use  . Vaping Use: Every day  Substance and Sexual Activity  . Alcohol use: No    Alcohol/week: 0.0 standard drinks  . Drug use: No  . Sexual activity: Not on file  Other Topics Concern  . Not on file  Social History Narrative   Remarried, lives with husband 1 son, he had TBI and short term memory loss after prolonged illness      #Patient lives in [place] with her husband; prior history of smoking/currently vapes.  No significant alcohol use.  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    Social Determinants of Health   Financial Resource Strain:   . Difficulty of Paying Living Expenses: Not on file  Food Insecurity:   . Worried About Charity fundraiser in the Last Year: Not on file  . Ran Out of Food in the Last Year: Not on file  Transportation Needs:   . Lack of Transportation (Medical): Not on file  . Lack of Transportation (Non-Medical): Not on file  Physical Activity:   . Days of Exercise per Week: Not on file  . Minutes of Exercise per Session: Not on file  Stress:   . Feeling of Stress : Not on file  Social Connections:   . Frequency of Communication with Friends and Family: Not on file  . Frequency of Social Gatherings with Friends and Family: Not on file  . Attends Religious Services: Not on file  . Active Member of Clubs or Organizations: Not on file  . Attends Archivist Meetings: Not on file  . Marital Status: Not on file  Intimate Partner Violence:   . Fear of Current or Ex-Partner: Not on file  . Emotionally Abused: Not on file  . Physically Abused: Not on file  . Sexually Abused: Not on file    FAMILY HISTORY: Family History  Problem Relation Age of Onset  . Hypertension Mother   . Colon cancer Father        d. 91   . Thyroid disease Sister   . Breast cancer Paternal Aunt   . Breast cancer Paternal Grandmother   . Colon cancer Paternal Grandmother   . Breast cancer Cousin        paternal aunt's daughter  . Colon cancer Paternal Uncle        3 pat uncles with colon cancer at unknown ages  . Pancreatic cancer Maternal Uncle        d. 57  . Breast cancer Paternal Aunt     ALLERGIES:  is allergic to sulfamethoxazole-trimethoprim and codeine.  MEDICATIONS:  Current Outpatient Medications  Medication Sig Dispense Refill  . Calcium Carbonate-Vit D-Min (CALCIUM 1200 PO) Take by mouth.    . diazepam (VALIUM) 10 MG tablet TAKE 1 TABLET BY MOUTH DAILY  AT BEDTIME AS NEEDED FOR ANXIETY. 30 tablet 0  . FLUoxetine (PROZAC) 20 MG capsule TAKE 3 CAPSULES BY MOUTH DAILY 270 capsule 0  . ibuprofen (ADVIL) 100 MG tablet Take 100 mg by mouth every 6 (six) hours as needed for fever.     . levothyroxine (SYNTHROID) 88 MCG tablet TAKE 1 TABLET BY MOUTH DAILY EXCEPT ON SUNDAYS TAKE 1/2 TABLET 90 tablet 0  . lidocaine-prilocaine (EMLA) cream Apply 1 application topically as needed. 30 g 0  . loperamide (IMODIUM A-D) 2 MG tablet Take 4 mg by mouth 4 (four) times daily as needed for diarrhea or loose stools.     . ondansetron (ZOFRAN) 8 MG tablet One pill every 8 hours as needed for nausea/vomitting. 40 tablet 1  . potassium chloride SA (KLOR-CON) 20 MEQ tablet TAKE 1 TABLET BY MOUTH TWICE A DAY (Patient taking differently: Take 20 mEq by mouth daily. TAKE 1 TABLET BY MOUTH TWICE A DAY) 60 tablet 3  . anastrozole (ARIMIDEX) 1 MG tablet Take 1 tablet (1 mg total) by mouth daily. Start it in [place], 2021. (Patient not taking: Reported on 06/29/2020) 30 tablet 4  . butalbital-acetaminophen-caffeine (FIORICET) 50-325-40 MG tablet TAKE 1 TABLET BY MOUTH EVERY 4 TO 6 HOURS AS NEEDED FOR HEADACHE (Patient not taking: Reported on 06/29/2020) 90 tablet 3  . dimenhyDRINATE (DRAMAMINE) 50 MG tablet Take 50 mg by mouth every 8  (eight) hours as needed. (Patient not taking: Reported on 04/28/2020)     No current facility-administered medications for this visit.      Marland Kitchen  PHYSICAL EXAMINATION: ECOG PERFORMANCE STATUS: 0 - Asymptomatic  Vitals:   06/29/20 1022  BP: 112/65  Pulse: 88  Resp: 16  Temp: 97.8 F (36.6 C)  SpO2: 98%   Filed Weights   06/29/20 1022  Weight: 141 lb (64 kg)    Physical Exam Constitutional:      Comments: Accompanied by husband.  HENT:     Head: Normocephalic and atraumatic.     Mouth/Throat:     Pharynx: No oropharyngeal exudate.  Eyes:     Pupils: Pupils are equal, round, and reactive to light.  Cardiovascular:     Rate and Rhythm: Normal rate and regular rhythm.  Pulmonary:     Effort: Pulmonary effort is normal. No respiratory distress.     Breath sounds: Normal breath sounds. No wheezing.  Abdominal:     General: Bowel sounds are normal. There is no distension.     Palpations: Abdomen is soft. There is no mass.     Tenderness: There is no abdominal tenderness. There is no guarding or rebound.  Musculoskeletal:        General: No tenderness. Normal range of motion.     Cervical back: Normal range of motion and neck supple.  Skin:    General: Skin is warm.     Comments: Macular rash in the radiation portal.  No desquamation.  Neurological:     Mental Status: She is alert and oriented to person, place, and time.  Psychiatric:        Mood and Affect: Affect normal.     Comments: Anxious    LABORATORY DATA:  I have reviewed the data as listed Lab Results  Component Value Date   WBC 6.3 06/29/2020   HGB 11.9 (L) 06/29/2020   HCT 36.6 06/29/2020   MCV 90.6 06/29/2020   PLT 275 06/29/2020   Recent Labs    08/14/19 1544 09/06/19 1054 10/10/19 1325 11/06/19  1194 01/15/20 0846 01/15/20 0846 02/25/20 1029 04/28/20 1011 06/29/20 1006  NA  --   --   --    < > 137   < > 136 138 135  K  --   --   --    < > 3.8   < > 4.0 3.5 3.8  CL  --   --   --    < >  105   < > 106 108 105  CO2  --   --   --    < > 20*   < > 23 21* 24  GLUCOSE  --   --   --    < > 198*   < > 96 112* 111*  BUN  --   --   --    < > 14   < > _0 CREATININE  --   --   --    < > 0.76   < > 0.87 0.77 0.80  CALCIUM   < >  --  8.6*   < > 9.4   < > 8.7* 9.0 9.0  GFRNONAA  --   --   --    < > >60   < > >60 >60 >60  GFRAA  --   --   --    < > >60  --  >60 >60  --   PROT  --  7.6 6.5   < > 7.5   < > 6.8 7.1 7.0  ALBUMIN  --  3.8 4.0   < > 4.2   < > 3.9 3.9 3.7  AST   < > 27 23   < > 27   < > _1 ALT   < > 18 21   < > 28   < > _2 ALKPHOS   < > 174* 200*   < > 105   < > 118 104 105  BILITOT  --  0.4 <0.2   < > 0.7   < > 0.4 0.5 0.7  BILIDIR  --  <0.1 0.07  --   --   --   --   --   --   IBILI  --  NOT CALCULATED  --   --   --   --   --   --   --    < > = values in this interval not displayed.    RADIOGRAPHIC STUDIES: I have personally reviewed the radiological images as listed and agreed with the findings in the report. No results found.  ASSESSMENT & PLAN:   Carcinoma of upper-outer quadrant of left breast in female, estrogen receptor positive ([place]) # Left breast cancer-2 separate primaries- A] T2N1-ER/PR positive HER-2 negative;  B] T1N1 ER/PR positive HER-2 negative. S/p 4 cycles of adjuvant chemotherapy Taxotere [60 mg/m] Cytoxan. S/p  RT [last 9/15].   #Recommend starting anastrozole.  Again reviewed the postmenopausal symptoms including but not limited to hot flashes/joint pains etc.  Patient is nervous but willing to try.  # Chronic hypokalemia/diarrhea-potassium 3.5.  STABLE; Recommend continue Kdur 20 meq/day.   # Elevated LFTs- alkaline phosphatase-123; improved-STABLE.   # BMD- T score= 0.9; continue Vit D [ca-constipation]  # Insomnia/likely secondary to anxiety- on valium improved.  Continue Valium.  # DISPOSITION:  #follow up in 1 months-MD;no labs--Dr.B  All questions were answered. The patient/family knows to call the clinic with any  problems, questions or concerns.  Cammie Sickle, MD 06/29/2020 9:00 PM

## 2020-07-02 ENCOUNTER — Other Ambulatory Visit: Payer: Self-pay

## 2020-07-02 ENCOUNTER — Telehealth (INDEPENDENT_AMBULATORY_CARE_PROVIDER_SITE_OTHER): Payer: No Typology Code available for payment source | Admitting: Family Medicine

## 2020-07-02 VITALS — Ht 63.0 in

## 2020-07-02 DIAGNOSIS — Z170 Estrogen receptor positive status [ER+]: Secondary | ICD-10-CM

## 2020-07-02 DIAGNOSIS — F411 Generalized anxiety disorder: Secondary | ICD-10-CM | POA: Diagnosis not present

## 2020-07-02 DIAGNOSIS — E039 Hypothyroidism, unspecified: Secondary | ICD-10-CM

## 2020-07-02 DIAGNOSIS — C50412 Malignant neoplasm of upper-outer quadrant of left female breast: Secondary | ICD-10-CM

## 2020-07-02 MED ORDER — LEVOTHYROXINE SODIUM 88 MCG PO TABS
ORAL_TABLET | ORAL | 1 refills | Status: DC
Start: 2020-07-02 — End: 2020-07-29

## 2020-07-02 MED ORDER — DIAZEPAM 10 MG PO TABS
ORAL_TABLET | ORAL | 2 refills | Status: DC
Start: 2020-07-02 — End: 2020-10-25

## 2020-07-02 MED ORDER — BUTALBITAL-APAP-CAFFEINE 50-325-40 MG PO TABS
ORAL_TABLET | ORAL | 0 refills | Status: DC
Start: 2020-07-02 — End: 2020-08-13

## 2020-07-02 MED ORDER — POTASSIUM CHLORIDE CRYS ER 20 MEQ PO TBCR
EXTENDED_RELEASE_TABLET | ORAL | Status: DC
Start: 2020-07-02 — End: 2020-12-22

## 2020-07-02 NOTE — Progress Notes (Signed)
Interactive audio and video telecommunications were attempted between this provider and patient, however failed, due to patient having technical difficulties OR patient did not have access to video capability.  We continued and completed visit with audio only.   Virtual Visit via Telephone Note  I connected with patient on 07/02/20  at 3:38 PM  by telephone and verified that I am speaking with the correct person using two identifiers.  Location of patient: home  Location of MD: [place] Name of referring provider (if blank then none associated): Names per persons and role in encounter:  MD: Earlyne Iba, Patient: name listed above.    I discussed the limitations, risks, security and privacy concerns of performing an evaluation and management service by telephone and the availability of in person appointments. I also discussed with the patient that there may be a patient responsible charge related to this service. The patient expressed understanding and agreed to proceed.  CC: follow up.   History of Present Illness:   She had a flu shot and covid vaccines.  Discussed with patient.  Valium used prn at night.  That helps with sleep.  Still on 60mg  prozac daily.  That helps.  She'll update me when she needs refills.  No adverse effect on medications.  Hypothyroidism.  Due for f/u TSH.  Compliant except taking no dose on Sundays.  No ADE on med.  I will ask if that can be drawn at the f/u with oncology with Dr. Rogue Bussing.    She has fatigue on anastrazole.  She is putting up with it, d/w pt.  We talked about the course of her cancer treatment.    She has fewer migraines in the midst of chemo treatment.     Observations/Objective: nad Speech normal  Assessment and Plan:  Insomnia and anxiety.  Continue Prozac and Valium.  No adverse effect on medications.  We talked about other options for insomnia.  It would be okay to use Tylenol PM occasionally, as that would be a limited  amount of Tylenol and should not cause a problem otherwise.  Routine vaccination.  She had flu shot and COVID vaccine.  History of breast cancer, she is tolerating anastrozole.  I will defer to oncology clinic.  Appreciate the help of all involved  Hypothyroidism.  Due for TSH.  I'll ask if this can be drawn with her next set of labs at the oncology clinic.  Follow Up Instructions: see above.    I discussed the assessment and treatment plan with the patient. The patient was provided an opportunity to ask questions and all were answered. The patient agreed with the plan and demonstrated an understanding of the instructions.   The patient was advised to call back or seek an in-person evaluation if the symptoms worsen or if the condition fails to improve as anticipated.  I provided 30 minutes of non-face-to-face time during this encounter.  [name], MD

## 2020-07-05 NOTE — Assessment & Plan Note (Signed)
Routine vaccination.  She had flu shot and COVID vaccine.  History of breast cancer, she is tolerating anastrozole.  I will defer to oncology clinic.  Appreciate the help of all involved

## 2020-07-05 NOTE — Assessment & Plan Note (Signed)
  Hypothyroidism.  Due for TSH.  I'll ask if this can be drawn with her next set of labs at the oncology clinic.

## 2020-07-05 NOTE — Assessment & Plan Note (Signed)
Insomnia and anxiety.  Continue Prozac and Valium.  No adverse effect on medications.  We talked about other options for insomnia.  It would be okay to use Tylenol PM occasionally, as that would be a limited amount of Tylenol and should not cause a problem otherwise.

## 2020-07-06 ENCOUNTER — Inpatient Hospital Stay (HOSPITAL_BASED_OUTPATIENT_CLINIC_OR_DEPARTMENT_OTHER): Payer: No Typology Code available for payment source | Admitting: Oncology

## 2020-07-06 ENCOUNTER — Telehealth: Payer: Self-pay | Admitting: *Deleted

## 2020-07-06 VITALS — BP 100/86 | HR 73 | Temp 98.3°F | Resp 18

## 2020-07-06 DIAGNOSIS — Z170 Estrogen receptor positive status [ER+]: Secondary | ICD-10-CM

## 2020-07-06 DIAGNOSIS — C50412 Malignant neoplasm of upper-outer quadrant of left female breast: Secondary | ICD-10-CM | POA: Diagnosis not present

## 2020-07-06 DIAGNOSIS — R599 Enlarged lymph nodes, unspecified: Secondary | ICD-10-CM

## 2020-07-06 DIAGNOSIS — C50912 Malignant neoplasm of unspecified site of left female breast: Secondary | ICD-10-CM

## 2020-07-06 DIAGNOSIS — E039 Hypothyroidism, unspecified: Secondary | ICD-10-CM

## 2020-07-06 NOTE — Telephone Encounter (Signed)
Labs added per md order 

## 2020-07-06 NOTE — Progress Notes (Signed)
   Symptom Management Consult note [facility]  [phone] [phone]  Patient Care Team: Duncan, Graham S, MD as PCP - General (Family Medicine) Brahmanday, Govinda R, MD as Consulting Physician (Internal Medicine) Wakefield, Matthew, MD as Consulting Physician (General Surgery) Lambert, Sheena M, [name]   Name of the patient: Jessica Barnes  [id number]  04/09/1961   Date of visit: 07/06/2020   Diagnosis-breast cancer  Chief complaint/ Reason for visit-swelling in left armpit  Heme/Onc history:  Oncology History Overview Note  # DEC 2020- LEFT BREAST-TWO PRIM ARIES:   #A]-IMC; ER/PR >90%; Her 2-IHC-2+; FISH-NEG-2 ; MRI- 3.7 CM; s/p s/p lumpectomy; s/p reexcision-final margins negative.  Stage Ib.    #B] Bx- IMC; G-1; ER/PR-pos; her2 NEG. s/p lumpectomy; margins negative [as per Dr.Wakefield; discussed at [place] tumor conference]  # MARCH 31st 2021- START Taxotere [60 mg/m]; Cytoxan; growth factor x4 cycles; s/p radiation 04/29/2020  # MID-NOV 2021- Start anastrozole  #Extreme anxiety/chronic diarrhea/chronic severe hypokalemia/history of migraines/depression  Used estrogen and progesterone therapy: on premarin for post- menopausal x 2-3 years- discontinued in JAN 2021.   # SURVIVORSHIP:   DIAGNOSIS: Breast cancer  STAGE: IB & I     ;  GOALS: Cure  CURRENT/MOST RECENT THERAPY : Anastrozole    Carcinoma of upper-outer quadrant of left breast in female, estrogen receptor positive (HCC)  08/14/2019 Initial Diagnosis   Carcinoma of upper-outer quadrant of left breast in female, estrogen receptor positive (HCC)   09/10/2019 Genetic Testing   Negative genetic testing on the STAT and common hereditary cancer panel.  The Common Hereditary Gene Panel offered by [facility] includes sequencing and/or deletion duplication testing of the following 48 genes: APC, ATM, AXIN2, BARD1, BMPR1A, BRCA1, BRCA2, BRIP1, CDH1,  CDK4, CDKN2A (p14ARF), CDKN2A (p16INK4a), CHEK2, CTNNA1, DICER1, EPCAM (Deletion/duplication testing only), GREM1 (promoter region deletion/duplication testing only), KIT, MEN1, MLH1, MSH2, MSH3, MSH6, MUTYH, NBN, NF1, NHTL1, PALB2, PDGFRA, PMS2, POLD1, POLE, PTEN, RAD50, RAD51C, RAD51D, RNF43, SDHB, SDHC, SDHD, SMAD4, SMARCA4. STK11, TP53, TSC1, TSC2, and VHL.  The following genes were evaluated for sequence changes only: SDHA and HOXB13 c.251G>A variant only. The report date is September 10, 2019.   10/03/2019 Cancer Staging   Staging form: Breast, AJCC 8th Edition - Pathologic stage from 10/03/2019: Stage IB (pT2, pN1a, cM0, G2, ER+, PR+, HER2-) - Signed by Causey, Lindsey Cornetto, NP on 10/16/2019   10/09/2019 - 10/09/2019 Chemotherapy   The patient had dexamethasone (DECADRON) 4 MG tablet, 0 of 1 cycle, Start date: --, End date: -- DOXOrubicin (ADRIAMYCIN) chemo injection 98 mg, 60 mg/m2, Intravenous,  Once, 0 of 4 cycles PALONOSETRON HCL INJECTION 0.25 MG/5ML, 0.25 mg, Intravenous,  Once, 0 of 4 cycles pegfilgrastim-jmdb (FULPHILA) injection 6 mg, 6 mg, Subcutaneous,  Once, 0 of 4 cycles cyclophosphamide (CYTOXAN) 980 mg in sodium chloride 0.9 % 250 mL chemo infusion, 600 mg/m2, Intravenous,  Once, 0 of 4 cycles PACLitaxel (TAXOL) 132 mg in sodium chloride 0.9 % 250 mL chemo infusion (</= 80mg/m2), 80 mg/m2, Intravenous,  Once, 0 of 12 cycles FOSAPREPITANT IV INFUSION 150 MG, 150 mg, Intravenous,  Once, 0 of 4 cycles  for chemotherapy treatment.    11/13/2019 -  Chemotherapy   The patient had dexamethasone (DECADRON) 4 MG tablet, 8 mg, Oral, 2 times daily, 1 of 1 cycle, Start date: --, End date: -- palonosetron (ALOXI) injection 0.25 mg, 0.25 mg, Intravenous,  Once, 4 of 4 cycles Administration: 0.25 mg (11/13/2019), 0.25 mg (12/04/2019),   0.25 mg (12/25/2019), 0.25 mg (01/15/2020) pegfilgrastim-jmdb (FULPHILA) injection 6 mg, 6 mg, Subcutaneous,  Once, 4 of 4 cycles Administration: 6 mg (11/14/2019), 6  mg (12/05/2019), 6 mg (12/26/2019), 6 mg (01/16/2020) cyclophosphamide (CYTOXAN) 1,000 mg in sodium chloride 0.9 % 250 mL chemo infusion, 980 mg, Intravenous,  Once, 4 of 4 cycles Administration: 1,000 mg (11/13/2019), 1,000 mg (12/04/2019), 1,000 mg (12/25/2019), 1,000 mg (01/15/2020) DOCEtaxel (TAXOTERE) 100 mg in sodium chloride 0.9 % 250 mL chemo infusion, 60 mg/m2 = 100 mg (100 % of original dose 60 mg/m2), Intravenous,  Once, 4 of 4 cycles Dose modification: 60 mg/m2 (original dose 60 mg/m2, Cycle 1, Reason: Provider Judgment) Administration: 100 mg (11/13/2019), 100 mg (12/04/2019), 100 mg (12/25/2019), 100 mg (01/15/2020)  for chemotherapy treatment.     Interval history-Jessica Barnes is a 59-year-old female with past medical history significant for migraine, hypothyroidism, hyperlipidemia, anxiety, depression, headache, joint pain with recent diagnosis of left breast cancer.  She is status post 4 cycles of Cytoxan/Taxotere with growth factor support.  She completed treatment on 01/16/2020.  She completed radiation on 04/29/20.  She tolerated treatment well except for some fatigue.  Today, she presents with left axillary swelling and a new breast mass which she discovered this past Thursday.  She reports noticing the swelling after taking a shower. She has pain with palpation and raising her arm above her head.  She denies any nipple drainage or retraction.  Denies any fevers or illness.  Denies recent Covid booster or vaccine in that left arm. Denies any neurologic complaints. Denies any easy bleeding or bruising. Reports good appetite and denies weight loss. Denies chest pain. Denies any nausea, vomiting, constipation, or diarrhea. Denies urinary complaints. Patient offers no further specific complaints today.  ECOG FS:0 - Asymptomatic  Review of systems- Review of Systems  Constitutional: Positive for malaise/fatigue. Negative for chills, fever and weight loss.  HENT: Negative for congestion, ear pain  and tinnitus.   Eyes: Negative.  Negative for blurred vision and double vision.  Respiratory: Negative.  Negative for cough, sputum production and shortness of breath.   Cardiovascular: Negative.  Negative for chest pain, palpitations and leg swelling.  Gastrointestinal: Negative.  Negative for abdominal pain, constipation, diarrhea, nausea and vomiting.  Genitourinary: Negative for dysuria, frequency and urgency.  Musculoskeletal: Negative for back pain and falls.       Left axillary swelling  Skin: Negative.  Negative for rash.  Neurological: Negative.  Negative for weakness and headaches.  Endo/Heme/Allergies: Negative.  Does not bruise/bleed easily.  Psychiatric/Behavioral: Negative.  Negative for depression. The patient is not nervous/anxious and does not have insomnia.      Current treatment-completed radiation and chemo-currently on Arimidex  Allergies  Allergen Reactions  . Sulfamethoxazole-Trimethoprim     Lip and tongue edema  . Codeine     GI upset     Past Medical History:  Diagnosis Date  . Anxiety 10/13/96  . Chest pain    atypical, hosp for that and depression 8/10-8/11/08  . COPD (chronic obstructive pulmonary disease) (HCC) 09/15/05   by x-ray  . Depression 10/13/96   hospital 8/10-8/11/08.  notable history: son with TBI after MVA  . Family history of breast cancer   . Family history of colon cancer   . Family history of pancreatic cancer   . Hyperlipidemia 09/15/98  . Hypothyroidism 01/14/96  . Migraine    27 yoa  . NSVD (normal spontaneous vaginal delivery)    x 1  . PONV (  postoperative nausea and vomiting)   . PTSD (post-traumatic stress disorder)    likely PTSD after son's MVA/TBI in 2007, s/p counseling     Past Surgical History:  Procedure Laterality Date  . APPENDECTOMY     59 years of age  . BREAST BIOPSY Left 08/06/2019   us bx/ ribbon clip/ invasive mammary carcinoma  . BREAST BIOPSY Left 09/06/2019   us bx, heart clip, invasive mammary  carcinoma   . BREAST LUMPECTOMY    . BREAST LUMPECTOMY WITH RADIOACTIVE SEED AND SENTINEL LYMPH NODE BIOPSY Left 10/01/2019   Procedure: LEFT BREAST LUMPECTOMY WITH BRACKETED RADIOACTIVE SEEDS AND LEFT AXILLARY SENTINEL LYMPH NODE BIOPSY;  Surgeon: Wakefield, Matthew, MD;  Location: [facility];  Service: General;  Laterality: Left;  . PORTACATH PLACEMENT Right 10/28/2019   Procedure: INSERTION PORT-A-CATH WITH ULTRASOUND GUIDANCE;  Surgeon: Wakefield, Matthew, MD;  Location: [facility];  Service: General;  Laterality: Right;  . RE-EXCISION OF BREAST LUMPECTOMY Left 10/28/2019   Procedure: RE-EXCISION OF LEFT BREAST LUMPECTOMY;  Surgeon: Wakefield, Matthew, MD;  Location: [facility];  Service: General;  Laterality: Left;    Social History   Socioeconomic History  . Marital status: Married    Spouse name: Not on file  . Number of children: Not on file  . Years of education: Not on file  . Highest education level: Not on file  Occupational History  . Not on file  Tobacco Use  . Smoking status: Former Smoker    Packs/day: 0.50    Years: 8.00    Pack years: 4.00    Types: Cigarettes  . Smokeless tobacco: Never Used  . Tobacco comment: quit 04/15/2014  Vaping Use  . Vaping Use: Every day  Substance and Sexual Activity  . Alcohol use: No    Alcohol/week: 0.0 standard drinks  . Drug use: No  . Sexual activity: Not on file  Other Topics Concern  . Not on file  Social History Narrative   Remarried, lives with husband 1 son, he had TBI and short term memory loss after prolonged illness      #Patient lives in [place] with her husband; prior history of smoking/currently vapes.  No significant alcohol use.  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    Social Determinants of Health   Financial Resource Strain:   . Difficulty of Paying Living Expenses: Not on file  Food Insecurity:   . Worried About Running Out of Food in the Last Year: Not on  file  . Ran Out of Food in the Last Year: Not on file  Transportation Needs:   . Lack of Transportation (Medical): Not on file  . Lack of Transportation (Non-Medical): Not on file  Physical Activity:   . Days of Exercise per Week: Not on file  . Minutes of Exercise per Session: Not on file  Stress:   . Feeling of Stress : Not on file  Social Connections:   . Frequency of Communication with Friends and Family: Not on file  . Frequency of Social Gatherings with Friends and Family: Not on file  . Attends Religious Services: Not on file  . Active Member of Clubs or Organizations: Not on file  . Attends Club or Organization Meetings: Not on file  . Marital Status: Not on file  Intimate Partner Violence:   . Fear of Current or Ex-Partner: Not on file  . Emotionally Abused: Not on file  . Physically Abused: Not on file  .   Sexually Abused: Not on file    Family History  Problem Relation Age of Onset  . Hypertension Mother   . Colon cancer Father        d. 73  . Thyroid disease Sister   . Breast cancer Paternal Aunt   . Breast cancer Paternal Grandmother   . Colon cancer Paternal Grandmother   . Breast cancer Cousin        paternal aunt's daughter  . Colon cancer Paternal Uncle        3 pat uncles with colon cancer at unknown ages  . Pancreatic cancer Maternal Uncle        d. 42  . Breast cancer Paternal Aunt      Current Outpatient Medications:  .  anastrozole (ARIMIDEX) 1 MG tablet, Take 1 tablet (1 mg total) by mouth daily. Start it in MID-October, 2021., Disp: 30 tablet, Rfl: 4 .  butalbital-acetaminophen-caffeine (FIORICET) 50-325-40 MG tablet, TAKE 1 TABLET BY MOUTH EVERY 4 TO 6 HOURS AS NEEDED FOR HEADACHE, Disp: 30 tablet, Rfl: 0 .  Calcium Carbonate-Vit D-Min (CALCIUM 1200 PO), Take by mouth., Disp: , Rfl:  .  diazepam (VALIUM) 10 MG tablet, TAKE 1 TABLET BY MOUTH DAILY AT BEDTIME AS NEEDED FOR ANXIETY.  Hold for next refill., Disp: 30 tablet, Rfl: 2 .  dimenhyDRINATE  (DRAMAMINE) 50 MG tablet, Take 50 mg by mouth every 8 (eight) hours as needed. , Disp: , Rfl:  .  FLUoxetine (PROZAC) 20 MG capsule, TAKE 3 CAPSULES BY MOUTH DAILY, Disp: 270 capsule, Rfl: 0 .  ibuprofen (ADVIL) 100 MG tablet, Take 100 mg by mouth every 6 (six) hours as needed for fever. , Disp: , Rfl:  .  levothyroxine (SYNTHROID) 88 MCG tablet, 1 tab a day except for skip dose of Sundays., Disp: 90 tablet, Rfl: 1 .  lidocaine-prilocaine (EMLA) cream, Apply 1 application topically as needed., Disp: 30 g, Rfl: 0 .  loperamide (IMODIUM A-D) 2 MG tablet, Take 4 mg by mouth 4 (four) times daily as needed for diarrhea or loose stools. , Disp: , Rfl:  .  ondansetron (ZOFRAN) 8 MG tablet, One pill every 8 hours as needed for nausea/vomitting., Disp: 40 tablet, Rfl: 1 .  potassium chloride SA (KLOR-CON) 20 MEQ tablet, TAKE 1 TABLET BY MOUTH ONCE A DAY, Disp: , Rfl:   Physical exam:  Vitals:   07/06/20 1314  BP: 100/86  Pulse: 73  Resp: 18  Temp: 98.3 F (36.8 C)  TempSrc: Tympanic   Physical Exam Constitutional:      Appearance: Normal appearance.  HENT:     Head: Normocephalic and atraumatic.  Eyes:     Pupils: Pupils are equal, round, and reactive to light.  Cardiovascular:     Rate and Rhythm: Normal rate and regular rhythm.     Heart sounds: Normal heart sounds. No murmur heard.   Pulmonary:     Effort: Pulmonary effort is normal.     Breath sounds: Normal breath sounds. No wheezing.  Abdominal:     General: Bowel sounds are normal. There is no distension.     Palpations: Abdomen is soft.     Tenderness: There is no abdominal tenderness.  Musculoskeletal:        General: Normal range of motion.     Cervical back: Normal range of motion.  Lymphadenopathy:     Upper Body:     Left upper body: Axillary adenopathy (Axillary swelling-no lymph node identified) present.  Skin:      General: Skin is warm and dry.     Findings: No rash.  Neurological:     Mental Status: She is alert  and oriented to person, place, and time.  Psychiatric:        Judgment: Judgment normal.      CMP Latest Ref Rng & Units 06/29/2020  Glucose 70 - 99 mg/dL 111(H)  BUN 6 - 20 mg/dL 17  Creatinine 0.44 - 1.00 mg/dL 0.80  Sodium 135 - 145 mmol/L 135  Potassium 3.5 - 5.1 mmol/L 3.8  Chloride 98 - 111 mmol/L 105  CO2 22 - 32 mmol/L 24  Calcium 8.9 - 10.3 mg/dL 9.0  Total Protein 6.5 - 8.1 g/dL 7.0  Total Bilirubin 0.3 - 1.2 mg/dL 0.7  Alkaline Phos 38 - 126 U/L 105  AST 15 - 41 U/L 19  ALT 0 - 44 U/L 17   CBC Latest Ref Rng & Units 06/29/2020  WBC 4.0 - 10.5 K/uL 6.3  Hemoglobin 12.0 - 15.0 g/dL 11.9(L)  Hematocrit 36 - 46 % 36.6  Platelets 150 - 400 K/uL 275    No images are attached to the encounter.  No results found.   Assessment and plan- Patient is a 59 y.o. female who presents to symptom management for new breast mass and swelling in her left axilla.  She is status post chemo/radiation and lumpectomy to the left breast.  She is currently on Arimidex and tolerating well.  Her last mammogram was at diagnosis.  Left breast mass and axillary swelling-on assessment, swelling is noted in that left axilla but no lymph node.  Small nonmovable breast mass near lumpectomy scar noted.  Otherwise, no additional mass identified.  Spoke with Dr. Rogue Bussing who recommends diagnostic mammogram and ultrasound of left breast and axilla stat.  Orders placed and sent to scheduling.  If imaging is negative for recurrent disease, would recommend evaluation in lymphedema clinic.  Plan- -Stat ultrasound and mammogram of left breast and axilla -Lymphedema clinic referral  Disposition- -Return to clinic as scheduled on 07/27/2020  Visit Diagnosis 1. Malignant neoplasm of left female breast, unspecified estrogen receptor status, unspecified site of breast ([place])   2. Adenopathy     Patient expressed understanding and was in agreement with this plan. She also understands that She can call  clinic at any time with any questions, concerns, or complaints.   Greater than 50% was spent in counseling and coordination of care with this patient including but not limited to discussion of the relevant topics above (See A&P) including, but not limited to diagnosis and management of acute and chronic medical conditions.   Thank you for allowing me to participate in the care of this very pleasant patient.    Jacquelin Hawking, NP [place] [facility] Cell - [phone] Pager- [phone] 07/06/2020 4:11 PM

## 2020-07-06 NOTE — Progress Notes (Signed)
Last Thurs pt noticed swelling under Left axilla after getting out of shower. Axilla is red swollen. She feels 2 other lumps in L breast around incision lines.Started arimidex on Monday.

## 2020-07-06 NOTE — Telephone Encounter (Signed)
-----   Message from Cammie Sickle, MD sent at 07/06/2020  1:05 PM EST ----- Regarding: RE: Follow-up TSH Will do, Dr.Duncan.  Please add TSH to her next labs draw.  GB ----- Message ----- From: Tonia Ghent, MD Sent: 07/05/2020  10:24 PM EST To: Cammie Sickle, MD Subject: Follow-up TSH                                  I have a follow-up TSH ordered in the EMR.  Is it possible to get this lab collected at her next visit with you?  I was trying to save her a lab visit here at the clinic.  Many thanks.  Brigitte Pulse

## 2020-07-06 NOTE — Progress Notes (Signed)
Survivorship Care Plan packet given to Radiation to give to patient.   Left note on packet I would call her this afternoon to review SCP.  T/C to pt but no answer so I left message asking pt to return my call on 06/29/2020.   Waiting for return call.  Treatment summary given to patient.  ASCO answers booklet given to patient.  CARE program and Cancer Transitions along with other resources cancer center offers to patients and caregivers in packet.

## 2020-07-07 NOTE — Telephone Encounter (Signed)
error 

## 2020-07-08 ENCOUNTER — Other Ambulatory Visit: Payer: Self-pay | Admitting: Oncology

## 2020-07-08 DIAGNOSIS — E039 Hypothyroidism, unspecified: Secondary | ICD-10-CM

## 2020-07-13 ENCOUNTER — Other Ambulatory Visit: Payer: Self-pay | Admitting: Internal Medicine

## 2020-07-17 ENCOUNTER — Other Ambulatory Visit: Payer: Self-pay

## 2020-07-17 ENCOUNTER — Ambulatory Visit
Admission: RE | Admit: 2020-07-17 | Discharge: 2020-07-17 | Disposition: A | Discharge status: Home / Self Care | Payer: No Typology Code available for payment source | Source: Ambulatory Visit | Attending: Oncology | Admitting: Oncology

## 2020-07-17 DIAGNOSIS — R599 Enlarged lymph nodes, unspecified: Secondary | ICD-10-CM

## 2020-07-17 DIAGNOSIS — C50912 Malignant neoplasm of unspecified site of left female breast: Secondary | ICD-10-CM

## 2020-07-17 HISTORY — DX: Personal history of irradiation: Z92.3

## 2020-07-17 HISTORY — DX: Personal history of antineoplastic chemotherapy: Z92.21

## 2020-07-24 ENCOUNTER — Encounter: Payer: Self-pay | Admitting: Internal Medicine

## 2020-07-24 ENCOUNTER — Other Ambulatory Visit: Payer: Self-pay | Admitting: *Deleted

## 2020-07-24 DIAGNOSIS — C50912 Malignant neoplasm of unspecified site of left female breast: Secondary | ICD-10-CM

## 2020-07-24 NOTE — Progress Notes (Signed)
Patient called/ pre-screened for appointment with oncologist, no new concerns voiced. She states he "knows everything that has been going on".

## 2020-07-27 ENCOUNTER — Inpatient Hospital Stay: Payer: Self-pay | Attending: Internal Medicine | Admitting: Internal Medicine

## 2020-07-27 ENCOUNTER — Inpatient Hospital Stay: Payer: Self-pay

## 2020-07-27 ENCOUNTER — Encounter: Payer: Self-pay | Admitting: Internal Medicine

## 2020-07-27 DIAGNOSIS — Z79811 Long term (current) use of aromatase inhibitors: Secondary | ICD-10-CM | POA: Insufficient documentation

## 2020-07-27 DIAGNOSIS — E785 Hyperlipidemia, unspecified: Secondary | ICD-10-CM | POA: Insufficient documentation

## 2020-07-27 DIAGNOSIS — Z170 Estrogen receptor positive status [ER+]: Secondary | ICD-10-CM | POA: Insufficient documentation

## 2020-07-27 DIAGNOSIS — G4700 Insomnia, unspecified: Secondary | ICD-10-CM | POA: Insufficient documentation

## 2020-07-27 DIAGNOSIS — R7989 Other specified abnormal findings of blood chemistry: Secondary | ICD-10-CM | POA: Insufficient documentation

## 2020-07-27 DIAGNOSIS — R197 Diarrhea, unspecified: Secondary | ICD-10-CM | POA: Insufficient documentation

## 2020-07-27 DIAGNOSIS — C50912 Malignant neoplasm of unspecified site of left female breast: Secondary | ICD-10-CM

## 2020-07-27 DIAGNOSIS — J449 Chronic obstructive pulmonary disease, unspecified: Secondary | ICD-10-CM | POA: Insufficient documentation

## 2020-07-27 DIAGNOSIS — C50412 Malignant neoplasm of upper-outer quadrant of left female breast: Secondary | ICD-10-CM | POA: Insufficient documentation

## 2020-07-27 DIAGNOSIS — Z87891 Personal history of nicotine dependence: Secondary | ICD-10-CM | POA: Insufficient documentation

## 2020-07-27 DIAGNOSIS — E039 Hypothyroidism, unspecified: Secondary | ICD-10-CM | POA: Insufficient documentation

## 2020-07-27 DIAGNOSIS — Z79899 Other long term (current) drug therapy: Secondary | ICD-10-CM | POA: Insufficient documentation

## 2020-07-27 DIAGNOSIS — F419 Anxiety disorder, unspecified: Secondary | ICD-10-CM | POA: Insufficient documentation

## 2020-07-27 DIAGNOSIS — F32A Depression, unspecified: Secondary | ICD-10-CM | POA: Insufficient documentation

## 2020-07-27 DIAGNOSIS — K5900 Constipation, unspecified: Secondary | ICD-10-CM | POA: Insufficient documentation

## 2020-07-27 DIAGNOSIS — Z9221 Personal history of antineoplastic chemotherapy: Secondary | ICD-10-CM | POA: Insufficient documentation

## 2020-07-27 DIAGNOSIS — Z803 Family history of malignant neoplasm of breast: Secondary | ICD-10-CM | POA: Insufficient documentation

## 2020-07-27 DIAGNOSIS — E876 Hypokalemia: Secondary | ICD-10-CM | POA: Insufficient documentation

## 2020-07-27 LAB — COMPREHENSIVE METABOLIC PANEL
ALT: 15 U/L (ref 0–44)
AST: 20 U/L (ref 15–41)
Albumin: 3.8 g/dL (ref 3.5–5.0)
Alkaline Phosphatase: 136 U/L — ABNORMAL HIGH (ref 38–126)
Anion gap: 10 (ref 5–15)
BUN: 16 mg/dL (ref 6–20)
CO2: 25 mmol/L (ref 22–32)
Calcium: 8.9 mg/dL (ref 8.9–10.3)
Chloride: 101 mmol/L (ref 98–111)
Creatinine, Ser: 0.82 mg/dL (ref 0.44–1.00)
GFR, Estimated: >60 mL/min (ref >60)
Glucose, Bld: 106 mg/dL — ABNORMAL HIGH (ref 70–99)
Potassium: 3.6 mmol/L (ref 3.5–5.1)
Sodium: 136 mmol/L (ref 135–145)
Total Bilirubin: 0.3 mg/dL (ref 0.3–1.2)
Total Protein: 7.1 g/dL (ref 6.5–8.1)

## 2020-07-27 LAB — CBC WITH DIFFERENTIAL/PLATELET
Abs Immature Granulocytes: 0.02 10*3/uL (ref 0.00–0.07)
Basophils Absolute: 0.1 10*3/uL (ref 0.0–0.1)
Basophils Relative: 1 %
Eosinophils Absolute: 0.5 10*3/uL (ref 0.0–0.5)
Eosinophils Relative: 6 %
HCT: 38.3 % (ref 36.0–46.0)
Hemoglobin: 12.4 g/dL (ref 12.0–15.0)
Immature Granulocytes: 0 %
Lymphocytes Relative: 26 %
Lymphs Abs: 2 10*3/uL (ref 0.7–4.0)
MCH: 30 pg (ref 26.0–34.0)
MCHC: 32.4 g/dL (ref 30.0–36.0)
MCV: 92.7 fL (ref 80.0–100.0)
Monocytes Absolute: 0.6 10*3/uL (ref 0.1–1.0)
Monocytes Relative: 8 %
Neutro Abs: 4.7 10*3/uL (ref 1.7–7.7)
Neutrophils Relative %: 59 %
Platelets: 288 10*3/uL (ref 150–400)
RBC: 4.13 MIL/uL (ref 3.87–5.11)
RDW: 13.8 % (ref 11.5–15.5)
WBC: 7.9 10*3/uL (ref 4.0–10.5)
nRBC: 0 % (ref 0.0–0.2)

## 2020-07-27 LAB — TSH: TSH: 0.194 u[IU]/mL — ABNORMAL LOW (ref 0.350–4.500)

## 2020-07-27 NOTE — Assessment & Plan Note (Addendum)
#  Left breast cancer-2 separate primaries- A] T2N1-ER/PR positive HER-2 negative;  B] T1N1 ER/PR positive HER-2 negative. S/p 4 cycles of adjuvant chemotherapy Taxotere [60 mg/m] Cytoxan. S/p  RT [last 9/15].  Last Mammo left-WNL [dec 3rd 2021];will need screening on right side.    #Currently on anastrozole; tolerating fairly well.  Left breast exam/recent mammogram-no concerns for any recurrent disease.  Reassured the patient.  # Chronic hypokalemia/diarrhea-potassium 3.5.  STABLE; Recommend continue Kdur 20 meq/day.   # Elevated LFTs- alkaline phosphatase-123; improved- STABLE.   # BMD- T score= 0.9; continue Vit D [ca-constipation]; STABLE.   # Insomnia/likely secondary to anxiety- on valium improved. STABLE;  Continue Valium.  # DISPOSITION:  #follow up in 3 months-MD;labs- cbc/cmp---Dr.B

## 2020-07-27 NOTE — Progress Notes (Signed)
[facility] NOTE  Patient Care Team: Tonia Ghent, MD as PCP - General (Family Medicine) Cammie Sickle, MD as Consulting Physician (Internal Medicine) Rolm Bookbinder, MD as Consulting Physician (General Surgery) Rico Junker, [name]  #  Oncology History Overview Note  # DEC 2020- LEFT BREAST-TWO PRIM ARIES:   #A]-IMC; ER/PR >90%; Her 2-IHC-2+; FISH-NEG-2 ; MRI- 3.7 CM; s/p s/p lumpectomy; s/p reexcision-final margins negative.  Stage Ib.    #B] Bx- IMC; G-1; ER/PR-pos; her2 NEG. s/p lumpectomy; margins negative [as per Dr.Wakefield; discussed at [facility] tumor conference]  # MARCH 31st 2021- START Taxotere [60 mg/m]; Cytoxan; growth factor x4 cycles; s/p radiation 04/29/2020  # MID-NOV 2021- Start anastrozole  #Extreme anxiety/chronic diarrhea/chronic severe hypokalemia/history of migraines/depression  Used estrogen and progesterone therapy: on premarin for post- menopausal x 2-3 years- discontinued in JAN 2021.   # SURVIVORSHIP:   DIAGNOSIS: Breast cancer  STAGE: IB & I     ;  GOALS: Cure  CURRENT/MOST RECENT THERAPY : Anastrozole    Carcinoma of upper-outer quadrant of left breast in female, estrogen receptor positive ([place])  08/14/2019 Initial Diagnosis   Carcinoma of upper-outer quadrant of left breast in female, estrogen receptor positive ([place])   09/10/2019 Genetic Testing   Negative genetic testing on the STAT and common hereditary cancer panel.  The Common Hereditary Gene Panel offered by [facility] includes sequencing and/or deletion duplication testing of the following 48 genes: APC, ATM, AXIN2, BARD1, BMPR1A, BRCA1, BRCA2, BRIP1, CDH1, CDK4, CDKN2A (p14ARF), CDKN2A (p16INK4a), CHEK2, CTNNA1, DICER1, EPCAM (Deletion/duplication testing only), GREM1 (promoter region deletion/duplication testing only), KIT, MEN1, MLH1, MSH2, MSH3, MSH6, MUTYH, NBN, NF1,  NHTL1, PALB2, PDGFRA, PMS2, POLD1, POLE, PTEN, RAD50, RAD51C, RAD51D, RNF43, SDHB, SDHC, SDHD, SMAD4, SMARCA4. STK11, TP53, TSC1, TSC2, and VHL.  The following genes were evaluated for sequence changes only: SDHA and HOXB13 c.251G>A variant only. The report date is September 10, 2019.   10/03/2019 Cancer Staging   Staging form: Breast, AJCC 8th Edition - Pathologic stage from 10/03/2019: Stage IB (pT2, pN1a, cM0, G2, ER+, PR+, HER2-) - Signed by Gardenia Phlegm, NP on 10/16/2019   10/09/2019 - 10/09/2019 Chemotherapy   The patient had dexamethasone (DECADRON) 4 MG tablet, 0 of 1 cycle, Start date: --, End date: -- DOXOrubicin (ADRIAMYCIN) chemo injection 98 mg, 60 mg/m2, Intravenous,  Once, 0 of 4 cycles PALONOSETRON HCL INJECTION 0.25 MG/5ML, 0.25 mg, Intravenous,  Once, 0 of 4 cycles pegfilgrastim-jmdb (FULPHILA) injection 6 mg, 6 mg, Subcutaneous,  Once, 0 of 4 cycles cyclophosphamide (CYTOXAN) 980 mg in sodium chloride 0.9 % 250 mL chemo infusion, 600 mg/m2, Intravenous,  Once, 0 of 4 cycles PACLitaxel (TAXOL) 132 mg in sodium chloride 0.9 % 250 mL chemo infusion (</= 32m/m2), 80 mg/m2, Intravenous,  Once, 0 of 12 cycles FOSAPREPITANT IV INFUSION 150 MG, 150 mg, Intravenous,  Once, 0 of 4 cycles  for chemotherapy treatment.    11/13/2019 -  Chemotherapy   The patient had dexamethasone (DECADRON) 4 MG tablet, 8 mg, Oral, 2 times daily, 1 of 1 cycle, Start date: --, End date: -- palonosetron (ALOXI) injection 0.25 mg, 0.25 mg, Intravenous,  Once, 4 of 4 cycles Administration: 0.25 mg (11/13/2019), 0.25 mg (12/04/2019), 0.25 mg (12/25/2019), 0.25 mg (01/15/2020) pegfilgrastim-jmdb (FULPHILA) injection 6 mg, 6 mg, Subcutaneous,  Once, 4 of 4 cycles Administration: 6 mg (11/14/2019), 6 mg (12/05/2019), 6 mg (12/26/2019), 6 mg (01/16/2020) cyclophosphamide (CYTOXAN)  1,000 mg in sodium chloride 0.9 % 250 mL chemo infusion, 980 mg, Intravenous,  Once, 4 of 4 cycles Administration: 1,000 mg (11/13/2019),  1,000 mg (12/04/2019), 1,000 mg (12/25/2019), 1,000 mg (01/15/2020) DOCEtaxel (TAXOTERE) 100 mg in sodium chloride 0.9 % 250 mL chemo infusion, 60 mg/m2 = 100 mg (100 % of original dose 60 mg/m2), Intravenous,  Once, 4 of 4 cycles Dose modification: 60 mg/m2 (original dose 60 mg/m2, Cycle 1, Reason: Provider Judgment) Administration: 100 mg (11/13/2019), 100 mg (12/04/2019), 100 mg (12/25/2019), 100 mg (01/15/2020)  for chemotherapy treatment.       HISTORY OF PRESENTING ILLNESS:  Jessica Barnes 59 y.o.  female with of breast cancer  [T2N1-ER/PR positive HER-2 negative; T1N1-ER/PR positive HER-2 negative 2 separate primaries] s/p adjuvant chemotherapy; also s/p adjuvant radiation is here for follow-up.  Patient is currently on adjuvant anastrozole.  Mild hot flashes.  Complains of pain/lump like mass in the left breast/the site of lumpectomy.  Patient denies any shortness of breath or cough.  Review of Systems  Constitutional: Positive for malaise/fatigue. Negative for chills, diaphoresis, fever and weight loss.  HENT: Negative for nosebleeds and sore throat.   Eyes: Negative for double vision.  Respiratory: Negative for cough, hemoptysis, sputum production and wheezing.   Cardiovascular: Negative for chest pain, palpitations, orthopnea and leg swelling.  Gastrointestinal: Negative for abdominal pain, blood in stool, constipation, heartburn, melena, nausea and vomiting.  Genitourinary: Negative for dysuria, frequency and urgency.  Musculoskeletal: Negative for back pain and joint pain.  Neurological: Negative for dizziness, tingling, focal weakness and weakness.  Endo/Heme/Allergies: Does not bruise/bleed easily.  Psychiatric/Behavioral: The patient is nervous/anxious and has insomnia.      MEDICAL HISTORY:  Past Medical History:  Diagnosis Date  . Anxiety 10/13/96  . Chest pain    atypical, hosp for that and depression 8/10-8/11/08  . COPD (chronic obstructive pulmonary disease)  ([place]) 09/15/05   by x-ray  . Depression 10/13/96   hospital 8/10-8/11/08.  notable history: son with TBI after MVA  . Family history of breast cancer   . Family history of colon cancer   . Family history of pancreatic cancer   . Hyperlipidemia 09/15/98  . Hypothyroidism 01/14/96  . Migraine    27 yoa  . NSVD (normal spontaneous vaginal delivery)    x 1  . Personal history of chemotherapy    finished 01/2020  . Personal history of radiation therapy    finished 04/2020  . PONV (postoperative nausea and vomiting)   . PTSD (post-traumatic stress disorder)    likely PTSD after son's MVA/TBI in 2007, s/p counseling    SURGICAL HISTORY: Past Surgical History:  Procedure Laterality Date  . APPENDECTOMY     59 years of age  . BREAST BIOPSY Left 08/06/2019   [place] bx/ ribbon clip/ invasive mammary carcinoma  . BREAST BIOPSY Left 09/06/2019   [place] bx, heart clip, invasive mammary carcinoma   . BREAST LUMPECTOMY Left 10/21/2019   Inv ductal Ca and DCIS   . BREAST LUMPECTOMY WITH RADIOACTIVE SEED AND SENTINEL LYMPH NODE BIOPSY Left 10/01/2019   Procedure: LEFT BREAST LUMPECTOMY WITH BRACKETED RADIOACTIVE SEEDS AND LEFT AXILLARY SENTINEL LYMPH NODE BIOPSY;  Surgeon: Rolm Bookbinder, MD;  Location: [facility];  Service: General;  Laterality: Left;  . PORTACATH PLACEMENT Right 10/28/2019   Procedure: INSERTION PORT-A-CATH WITH ULTRASOUND GUIDANCE;  Surgeon: Rolm Bookbinder, MD;  Location: [facility];  Service: General;  Laterality: Right;  . RE-EXCISION OF BREAST  LUMPECTOMY Left 10/28/2019   Procedure: RE-EXCISION OF LEFT BREAST LUMPECTOMY;  Surgeon: Rolm Bookbinder, MD;  Location: [facility];  Service: General;  Laterality: Left;    SOCIAL HISTORY: Social History   Socioeconomic History  . Marital status: Married    Spouse name: Not on file  . Number of children: Not on file  . Years of education: Not on file  . Highest education level: Not on  file  Occupational History  . Not on file  Tobacco Use  . Smoking status: Former Smoker    Packs/day: 0.50    Years: 8.00    Pack years: 4.00    Types: Cigarettes  . Smokeless tobacco: Never Used  . Tobacco comment: quit 04/15/2014  Vaping Use  . Vaping Use: Every day  Substance and Sexual Activity  . Alcohol use: No    Alcohol/week: 0.0 standard drinks  . Drug use: No  . Sexual activity: Not on file  Other Topics Concern  . Not on file  Social History Narrative   Remarried, lives with husband 1 son, he had TBI and short term memory loss after prolonged illness      #Patient lives in [place] with her husband; prior history of smoking/currently vapes.  No significant alcohol use.  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    Social Determinants of Health   Financial Resource Strain: Not on file  Food Insecurity: Not on file  Transportation Needs: Not on file  Physical Activity: Not on file  Stress: Not on file  Social Connections: Not on file  Intimate Partner Violence: Not on file    FAMILY HISTORY: Family History  Problem Relation Age of Onset  . Hypertension Mother   . Colon cancer Father        d. 74  . Thyroid disease Sister   . Breast cancer Paternal Aunt   . Breast cancer Paternal Grandmother   . Colon cancer Paternal Grandmother   . Breast cancer Cousin        paternal aunt's daughter  . Colon cancer Paternal Uncle        3 pat uncles with colon cancer at unknown ages  . Pancreatic cancer Maternal Uncle        d. 98  . Breast cancer Paternal Aunt     ALLERGIES:  is allergic to sulfamethoxazole-trimethoprim and codeine.  MEDICATIONS:  Current Outpatient Medications  Medication Sig Dispense Refill  . anastrozole (ARIMIDEX) 1 MG tablet Take 1 tablet (1 mg total) by mouth daily. Start it in MID-October, 2021. 30 tablet 4  . butalbital-acetaminophen-caffeine (FIORICET) 50-325-40 MG tablet TAKE 1 TABLET BY MOUTH EVERY 4 TO 6 HOURS AS NEEDED FOR HEADACHE 30  tablet 0  . Calcium Carbonate-Vit D-Min (CALCIUM 1200 PO) Take by mouth.    . diazepam (VALIUM) 10 MG tablet TAKE 1 TABLET BY MOUTH DAILY AT BEDTIME AS NEEDED FOR ANXIETY.  Hold for next refill. 30 tablet 2  . dimenhyDRINATE (DRAMAMINE) 50 MG tablet Take 50 mg by mouth every 8 (eight) hours as needed.     . diphenoxylate-atropine (LOMOTIL) 2.5-0.025 MG tablet TAKE 1 TABLET BY MOUTH 4 TIMES A DAY AS NEEDED FOR DIARHEA OR LOOSE STOOLS. TAKE WITH IMMODIUM 30 tablet 0  . FLUoxetine (PROZAC) 20 MG capsule TAKE 3 CAPSULES BY MOUTH DAILY 270 capsule 0  . ibuprofen (ADVIL) 100 MG tablet Take 100 mg by mouth every 6 (six) hours as needed for fever.     . lidocaine-prilocaine (EMLA) cream  Apply 1 application topically as needed. 30 g 0  . loperamide (IMODIUM A-D) 2 MG tablet Take 4 mg by mouth 4 (four) times daily as needed for diarrhea or loose stools.     . ondansetron (ZOFRAN) 8 MG tablet One pill every 8 hours as needed for nausea/vomitting. 40 tablet 1  . potassium chloride SA (KLOR-CON) 20 MEQ tablet TAKE 1 TABLET BY MOUTH ONCE A DAY    . levothyroxine (SYNTHROID) 75 MCG tablet Take 1 tablet each morning on empty stomach except for 0.5 tablet on Sundays. 100 tablet 1   No current facility-administered medications for this visit.      Marland Kitchen  PHYSICAL EXAMINATION: ECOG PERFORMANCE STATUS: 0 - Asymptomatic  Vitals:   07/24/20 1513  BP: (!) 111/54  Pulse: 83  Resp: 12  Temp: 98.2 F (36.8 C)   Filed Weights   07/24/20 1513  Weight: 136 lb 11.2 oz (62 kg)    Physical Exam Constitutional:      Comments: Accompanied by husband.  HENT:     Head: Normocephalic and atraumatic.     Mouth/Throat:     Pharynx: No oropharyngeal exudate.  Eyes:     Pupils: Pupils are equal, round, and reactive to light.  Cardiovascular:     Rate and Rhythm: Normal rate and regular rhythm.  Pulmonary:     Effort: Pulmonary effort is normal. No respiratory distress.     Breath sounds: Normal breath sounds. No  wheezing.  Abdominal:     General: Bowel sounds are normal. There is no distension.     Palpations: Abdomen is soft. There is no mass.     Tenderness: There is no abdominal tenderness. There is no guarding or rebound.  Musculoskeletal:        General: No tenderness. Normal range of motion.     Cervical back: Normal range of motion and neck supple.  Skin:    General: Skin is warm.     Comments:  left BREAST exam (in the presence of nurse)- no unusual skin changes or dominant masses felt. Surgical scars noted.    Neurological:     Mental Status: She is alert and oriented to person, place, and time.  Psychiatric:        Mood and Affect: Affect normal.     Comments: Anxious    LABORATORY DATA:  I have reviewed the data as listed Lab Results  Component Value Date   WBC 7.9 07/27/2020   HGB 12.4 07/27/2020   HCT 38.3 07/27/2020   MCV 92.7 07/27/2020   PLT 288 07/27/2020   Recent Labs    09/06/19 1054 10/10/19 1325 11/06/19 0950 01/15/20 0846 02/25/20 1029 04/28/20 1011 06/29/20 1006 07/27/20 0954  NA  --   --    < > 137 136 138 135 136  K  --   --    < > 3.8 4.0 3.5 3.8 3.6  CL  --   --    < > 105 106 108 105 101  CO2  --   --    < > 20* 23 21* 24 25  GLUCOSE  --   --    < > 198* 96 112* 111* 106*  BUN  --   --    < > $R'14 13 14 17 16  'jo$ CREATININE  --   --    < > 0.76 0.87 0.77 0.80 0.82  CALCIUM  --  8.6*   < > 9.4 8.7* 9.0 9.0 8.9  GFRNONAA  --   --    < > >60 >60 >60 >60 >60  GFRAA  --   --    < > >60 >60 >60  --   --   PROT 7.6 6.5   < > 7.5 6.8 7.1 7.0 7.1  ALBUMIN 3.8 4.0   < > 4.2 3.9 3.9 3.7 3.8  AST 27 23   < > $R'27 17 20 19 20  'Pg$ ALT 18 21   < > $R'28 14 17 17 15  'Up$ ALKPHOS 174* 200*   < > 105 118 104 105 136*  BILITOT 0.4 <0.2   < > 0.7 0.4 0.5 0.7 0.3  BILIDIR <0.1 0.07  --   --   --   --   --   --   IBILI NOT CALCULATED  --   --   --   --   --   --   --    < > = values in this interval not displayed.    RADIOGRAPHIC STUDIES: I have personally reviewed the  radiological images as listed and agreed with the findings in the report. US Breast Limited Uni Left Inc Axilla  Result Date: 07/17/2020 CLINICAL DATA:  Patient had LEFT lumpectomy with re-excision in February of 2021. Patient was treated with radiation therapy. Most recently she presents with LEFT breast mass and axillary swelling. The patient reports that since was seen by her oncologist, she has noted a lump in the LEFT side of her neck. EXAM: DIGITAL DIAGNOSTIC LEFT MAMMOGRAM WITH CAD AND TOMO ULTRASOUND LEFT BREAST COMPARISON:  Previous exam(s). ACR Breast Density Category c: The breast tissue is heterogeneously dense, which may obscure small masses. FINDINGS: Postoperative changes are identified in the central portion of the LEFT breast. BB marks area of patient's concern in the superior portion of the LEFT breast. Spot tangential view of this region is unremarkable. BB marks the area of patient's concern in the LOWER portion of the LEFT breast, and spot compression view of this region is unremarkable. Mammographic images were processed with CAD. On physical exam, there are postoperative changes in the central portion of the LEFT breast. I palpate no discrete abnormality in the LEFT axilla where the patient feels swelling. I palpate no discrete mass in the 6 o'clock location of the LEFT breast the patient notes significant pain. Targeted ultrasound is performed, showing postoperative changes and normal axillary lymph nodes in the LEFT axilla. No mass or adenopathy. Normal appearing fibrofatty tissue is identified in the LOWER central portion of the LEFT breast in the area of focal pain. IMPRESSION: No mammographic or ultrasound evidence for malignancy. RECOMMENDATION: Recommend bilateral diagnostic mammogram later this month. I recommended that the patient discussed the LEFT neck lump with her physician. I have discussed the findings and recommendations with the patient. If applicable, a reminder letter  will be sent to the patient regarding the next appointment. BI-RADS CATEGORY  2: Benign. Electronically Signed   By: Nolon Nations M.D.   On: 07/17/2020 14:47   MM DIAG BREAST TOMO UNI LEFT  Result Date: 07/17/2020 CLINICAL DATA:  Patient had LEFT lumpectomy with re-excision in February of 2021. Patient was treated with radiation therapy. Most recently she presents with LEFT breast mass and axillary swelling. The patient reports that since was seen by her oncologist, she has noted a lump in the LEFT side of her neck. EXAM: DIGITAL DIAGNOSTIC LEFT MAMMOGRAM WITH CAD AND TOMO ULTRASOUND LEFT BREAST COMPARISON:  Previous exam(s). ACR  Breast Density Category c: The breast tissue is heterogeneously dense, which may obscure small masses. FINDINGS: Postoperative changes are identified in the central portion of the LEFT breast. BB marks area of patient's concern in the superior portion of the LEFT breast. Spot tangential view of this region is unremarkable. BB marks the area of patient's concern in the LOWER portion of the LEFT breast, and spot compression view of this region is unremarkable. Mammographic images were processed with CAD. On physical exam, there are postoperative changes in the central portion of the LEFT breast. I palpate no discrete abnormality in the LEFT axilla where the patient feels swelling. I palpate no discrete mass in the 6 o'clock location of the LEFT breast the patient notes significant pain. Targeted ultrasound is performed, showing postoperative changes and normal axillary lymph nodes in the LEFT axilla. No mass or adenopathy. Normal appearing fibrofatty tissue is identified in the LOWER central portion of the LEFT breast in the area of focal pain. IMPRESSION: No mammographic or ultrasound evidence for malignancy. RECOMMENDATION: Recommend bilateral diagnostic mammogram later this month. I recommended that the patient discussed the LEFT neck lump with her physician. I have discussed the  findings and recommendations with the patient. If applicable, a reminder letter will be sent to the patient regarding the next appointment. BI-RADS CATEGORY  2: Benign. Electronically Signed   By: Nolon Nations M.D.   On: 07/17/2020 14:47    ASSESSMENT & PLAN:   Carcinoma of upper-outer quadrant of left breast in female, estrogen receptor positive ([place]) # Left breast cancer-2 separate primaries- A] T2N1-ER/PR positive HER-2 negative;  B] T1N1 ER/PR positive HER-2 negative. S/p 4 cycles of adjuvant chemotherapy Taxotere [60 mg/m] Cytoxan. S/p  RT [last 9/15].  Last Mammo left-WNL [dec 3rd 2021];will need screening on right side.    #Currently on anastrozole; tolerating fairly well.  Left breast exam/recent mammogram-no concerns for any recurrent disease.  Reassured the patient.  # Chronic hypokalemia/diarrhea-potassium 3.5.  STABLE; Recommend continue Kdur 20 meq/day.   # Elevated LFTs- alkaline phosphatase-123; improved- STABLE.   # BMD- T score= 0.9; continue Vit D [ca-constipation]; STABLE.   # Insomnia/likely secondary to anxiety- on valium improved. STABLE;  Continue Valium.  # DISPOSITION:  #follow up in 3 months-MD;labs- cbc/cmp---Dr.B  All questions were answered. The patient/family knows to call the clinic with any problems, questions or concerns.    Cammie Sickle, MD 08/04/2020 11:08 PM

## 2020-07-29 ENCOUNTER — Telehealth: Payer: Self-pay | Admitting: Oncology

## 2020-07-29 ENCOUNTER — Other Ambulatory Visit: Payer: Self-pay | Admitting: Family Medicine

## 2020-07-29 DIAGNOSIS — C50912 Malignant neoplasm of unspecified site of left female breast: Secondary | ICD-10-CM

## 2020-07-29 MED ORDER — LEVOTHYROXINE SODIUM 75 MCG PO TABS
ORAL_TABLET | ORAL | 1 refills | Status: DC
Start: 2020-07-29 — End: 2021-01-18

## 2020-07-29 NOTE — Telephone Encounter (Signed)
Re: Results from breast mammogram and ultrasound  She was seen in clinic at the end of November 2021 for complaints of left axillary swelling and possible breast mass.  Mammogram did not show any findings concerning for recurrent disease in the left breast.  She is due for a diagnostic mammogram of the bilateral breasts on 07/31/2020.  Patient did not want to stay to have her right breast mammogram completed secondary to possible denial from insurance company.    She will be due for repeat bilateral mammogram as of 07/31/2020.   Orders placed for bilateral diagnostic mammogram.  We will get this scheduled and call patient.  Faythe Casa, NP 07/29/2020 2:57 PM

## 2020-07-29 NOTE — Telephone Encounter (Signed)
-----   Message from Lloyd Huger, MD sent at 07/23/2020  4:16 PM EST ----- I'm confused why they want a repeat in a month for a benign lesion.  Can you call Dr. Owens Shark and ask?   ----- Message ----- From: Jacquelin Hawking, NP Sent: 07/23/2020   4:15 PM EST To: Lloyd Huger, MD  Repeat in 1 month?

## 2020-08-13 ENCOUNTER — Other Ambulatory Visit: Payer: Self-pay | Admitting: Family Medicine

## 2020-08-13 NOTE — Telephone Encounter (Signed)
Pharmacy requests refill on: BAC 50-325-40 mg   LAST REFILL: 07/02/2020 (Q-30, R-0) LAST OV: 07/02/2020 NEXT OV: Not Scheduled  PHARMACY: [facility]

## 2020-08-13 NOTE — Telephone Encounter (Signed)
Sent. Thanks.   

## 2020-08-17 ENCOUNTER — Other Ambulatory Visit: Payer: Self-pay

## 2020-09-15 ENCOUNTER — Other Ambulatory Visit: Payer: Self-pay | Admitting: Family Medicine

## 2020-10-08 ENCOUNTER — Other Ambulatory Visit: Payer: Self-pay | Admitting: Family Medicine

## 2020-10-08 NOTE — Telephone Encounter (Signed)
Pharmacy requests refill on: BAC 50-325-40 mg    LAST REFILL: 08/13/2020 (Q-30, R-1) LAST OV: 07/02/2020 NEXT OV: Not Scheduled  PHARMACY: [facility]

## 2020-10-09 NOTE — Telephone Encounter (Signed)
Sent. Thanks.   

## 2020-10-16 ENCOUNTER — Telehealth: Payer: Self-pay

## 2020-10-16 NOTE — Telephone Encounter (Signed)
Attempted to schedule [facility] for patient, but was informed that they do not to do urine samples for [facility]. Informed patient of this information and apologized for the misunderstanding. Patient refused to go to [facility] or ED. Patient was upset that Dr. Damita Dunnings would not call in antibiotics for her and stated, "I will not be coming back there. I am going to find another doctor that can help me! I need help, I don't need sympathy!" Voiced understanding, but encouraged patient that she needed to be seen. Patient continued to refuse D/T her cancer diagnosis and ended phone call politely.

## 2020-10-16 NOTE — Telephone Encounter (Signed)
[facility] - Client TELEPHONE ADVICE RECORD [facility] Patient Name: Jessica Barnes Gender: Female DOB: 08/27/1960 Age: 60 Y 31 M Return Phone Number: [phone] (Primary) [place] [address] Client [facility] - Client Client Site [place] - Day Physician Renford Dills - MD Contact Type Call Who Is Calling Patient / Member / Family / Caregiver Call Type Triage / Clinical Relationship To Patient Self Return Phone Number [phone] (Primary) Chief Complaint Urination Frequency Reason for Call Symptomatic / Request for [facility] Office Staff transferred caller, says she has urgency and pain in urinating, has a history of Cancer---- NOTE---Caller says her pharmacy closes at 6pm Translation No Nurse Assessment Nurse: Donna Christen, RN, Legrand Como Date/Time Eilene Ghazi Time): 10/16/2020 4:23:25 PM Confirm and document reason for call. If symptomatic, describe symptoms. ---Caller states that she has urgency and pain in urinating. Has been taking Azo. History of Cancer and does not want to come to office. No Fever. States it is common for her to get UTI but has been a while since she had one. Does the patient have any new or worsening symptoms? ---Yes Will a triage be completed? ---Yes Related visit to physician within the last 2 weeks? ---No Does the PT have any chronic conditions? (i.e. diabetes, asthma, this includes High risk factors for pregnancy, etc.) ---Yes List chronic conditions. ---Breast Cancer last chemo/radiation Sept 15th, Is this a behavioral health or substance abuse call? ---No Guidelines Guideline Title Affirmed Question Affirmed Notes Nurse Date/Time Eilene Ghazi Time) Urination Pain - Female Diabetes mellitus or weak immune system (e.g., HIV positive, cancer chemo, splenectomy, organ transplant, chronic steroids) Donna Christen RN,  Legrand Como 10/16/2020 4:25:28 PM Disp. Time Eilene Ghazi Time) Disposition Final User PLEASE NOTE: All timestamps contained within this report are represented as [place] Standard Time. CONFIDENTIALTY NOTICE: This fax transmission is intended only for the addressee. It contains information that is legally privileged, confidential or otherwise protected from use or disclosure. If you are not the intended recipient, you are strictly prohibited from reviewing, disclosing, copying using or disseminating any of this information or taking any action in reliance on or regarding this information. If you have received this fax in error, please notify us immediately by telephone so that we can arrange for its return to [place]. Phone: [phone], Toll-Free: [phone], Fax: [phone] Page: 2 of 2 Call Id: [id number] 10/16/2020 4:29:30 PM See HCP within 4 Hours (or PCP triage) Yes Donna Christen, RN, Gerome Sam Disagree/Comply Comply Caller Understands Yes PreDisposition Call Doctor Care Advice Given Per Guideline SEE HCP (OR PCP TRIAGE) WITHIN 4 HOURS: CARE ADVICE given per Urination Pain - Female (Adult) guideline. * You become worse CALL BACK IF: Referrals Warm transfer to backline

## 2020-10-16 NOTE — Telephone Encounter (Signed)
Pt notified as instructed and she said she has had a lot of UTIs and she just needs an abx. Pt said she has someone on phone trying to schedule virtual visit now and ended my call to speak with that person. Sending note to Dr Damita Dunnings.

## 2020-10-16 NOTE — Telephone Encounter (Signed)
Patient called in and stated that she has a UTI. She stated that this is very common for her and she doesn't want to come in the office D/T her cancer diagnosis. Patient stated that her symptoms of burning, painful urination, increased frequency, and dark/foul smelling urine started a few days ago. Denies fever or hematuria. Patient is requesting antibiotics to be sent into [place]. Please advise.

## 2020-10-16 NOTE — Telephone Encounter (Signed)
I thank all involved.  I will defer to patient.  The advice stands re: eval.

## 2020-10-16 NOTE — Telephone Encounter (Signed)
I am sympathetic but I would suggest she get checked, esp in case she needs a urine culture.

## 2020-10-16 NOTE — Telephone Encounter (Signed)
Please see Anisha RN's note under access nurse note; Cardell Peach has already spoken with pt and sent note to Dr Damita Dunnings.

## 2020-10-21 ENCOUNTER — Other Ambulatory Visit: Payer: Self-pay | Admitting: Family Medicine

## 2020-10-21 ENCOUNTER — Ambulatory Visit: Payer: Self-pay | Admitting: Primary Care

## 2020-10-21 NOTE — Telephone Encounter (Signed)
Refill request for diazepam 10 mg tablets  LOV - 07/02/20 Next OV - not scheduled Last refill - 07/02/20 #30/2

## 2020-10-25 NOTE — Telephone Encounter (Signed)
Sent. Thanks.   

## 2020-10-26 ENCOUNTER — Other Ambulatory Visit: Payer: Self-pay | Admitting: *Deleted

## 2020-10-26 ENCOUNTER — Encounter: Payer: Self-pay | Admitting: Internal Medicine

## 2020-10-26 ENCOUNTER — Inpatient Hospital Stay (HOSPITAL_BASED_OUTPATIENT_CLINIC_OR_DEPARTMENT_OTHER): Payer: BC Managed Care – PPO | Admitting: Internal Medicine

## 2020-10-26 ENCOUNTER — Inpatient Hospital Stay: Payer: BC Managed Care – PPO | Attending: Internal Medicine

## 2020-10-26 DIAGNOSIS — Z79811 Long term (current) use of aromatase inhibitors: Secondary | ICD-10-CM | POA: Insufficient documentation

## 2020-10-26 DIAGNOSIS — Z170 Estrogen receptor positive status [ER+]: Secondary | ICD-10-CM | POA: Diagnosis not present

## 2020-10-26 DIAGNOSIS — Z800 Family history of malignant neoplasm of digestive organs: Secondary | ICD-10-CM | POA: Diagnosis not present

## 2020-10-26 DIAGNOSIS — N941 Unspecified dyspareunia: Secondary | ICD-10-CM | POA: Insufficient documentation

## 2020-10-26 DIAGNOSIS — R232 Flushing: Secondary | ICD-10-CM | POA: Insufficient documentation

## 2020-10-26 DIAGNOSIS — Z803 Family history of malignant neoplasm of breast: Secondary | ICD-10-CM | POA: Insufficient documentation

## 2020-10-26 DIAGNOSIS — R944 Abnormal results of kidney function studies: Secondary | ICD-10-CM | POA: Diagnosis not present

## 2020-10-26 DIAGNOSIS — R197 Diarrhea, unspecified: Secondary | ICD-10-CM | POA: Diagnosis not present

## 2020-10-26 DIAGNOSIS — Z791 Long term (current) use of non-steroidal anti-inflammatories (NSAID): Secondary | ICD-10-CM | POA: Diagnosis not present

## 2020-10-26 DIAGNOSIS — G4700 Insomnia, unspecified: Secondary | ICD-10-CM | POA: Insufficient documentation

## 2020-10-26 DIAGNOSIS — J449 Chronic obstructive pulmonary disease, unspecified: Secondary | ICD-10-CM | POA: Diagnosis not present

## 2020-10-26 DIAGNOSIS — Z9221 Personal history of antineoplastic chemotherapy: Secondary | ICD-10-CM | POA: Diagnosis not present

## 2020-10-26 DIAGNOSIS — Z79899 Other long term (current) drug therapy: Secondary | ICD-10-CM | POA: Insufficient documentation

## 2020-10-26 DIAGNOSIS — E876 Hypokalemia: Secondary | ICD-10-CM | POA: Insufficient documentation

## 2020-10-26 DIAGNOSIS — N951 Menopausal and female climacteric states: Secondary | ICD-10-CM | POA: Diagnosis not present

## 2020-10-26 DIAGNOSIS — F4310 Post-traumatic stress disorder, unspecified: Secondary | ICD-10-CM | POA: Insufficient documentation

## 2020-10-26 DIAGNOSIS — E785 Hyperlipidemia, unspecified: Secondary | ICD-10-CM | POA: Diagnosis not present

## 2020-10-26 DIAGNOSIS — F418 Other specified anxiety disorders: Secondary | ICD-10-CM | POA: Insufficient documentation

## 2020-10-26 DIAGNOSIS — C50412 Malignant neoplasm of upper-outer quadrant of left female breast: Secondary | ICD-10-CM | POA: Insufficient documentation

## 2020-10-26 DIAGNOSIS — Z923 Personal history of irradiation: Secondary | ICD-10-CM | POA: Insufficient documentation

## 2020-10-26 DIAGNOSIS — E039 Hypothyroidism, unspecified: Secondary | ICD-10-CM | POA: Insufficient documentation

## 2020-10-26 DIAGNOSIS — N952 Postmenopausal atrophic vaginitis: Secondary | ICD-10-CM | POA: Diagnosis not present

## 2020-10-26 DIAGNOSIS — C50912 Malignant neoplasm of unspecified site of left female breast: Secondary | ICD-10-CM

## 2020-10-26 LAB — CBC WITH DIFFERENTIAL/PLATELET
Abs Immature Granulocytes: 0.02 10*3/uL (ref 0.00–0.07)
Basophils Absolute: 0.1 10*3/uL (ref 0.0–0.1)
Basophils Relative: 1 %
Eosinophils Absolute: 0.2 10*3/uL (ref 0.0–0.5)
Eosinophils Relative: 2 %
HCT: 40.1 % (ref 36.0–46.0)
Hemoglobin: 13 g/dL (ref 12.0–15.0)
Immature Granulocytes: 0 %
Lymphocytes Relative: 20 %
Lymphs Abs: 2 10*3/uL (ref 0.7–4.0)
MCH: 31.3 pg (ref 26.0–34.0)
MCHC: 32.4 g/dL (ref 30.0–36.0)
MCV: 96.4 fL (ref 80.0–100.0)
Monocytes Absolute: 0.7 10*3/uL (ref 0.1–1.0)
Monocytes Relative: 7 %
Neutro Abs: 6.8 10*3/uL (ref 1.7–7.7)
Neutrophils Relative %: 70 %
Platelets: 386 10*3/uL (ref 150–400)
RBC: 4.16 MIL/uL (ref 3.87–5.11)
RDW: 13.7 % (ref 11.5–15.5)
WBC: 9.9 10*3/uL (ref 4.0–10.5)
nRBC: 0 % (ref 0.0–0.2)

## 2020-10-26 LAB — COMPREHENSIVE METABOLIC PANEL
ALT: 28 U/L (ref 0–44)
AST: 32 U/L (ref 15–41)
Albumin: 4.1 g/dL (ref 3.5–5.0)
Alkaline Phosphatase: 134 U/L — ABNORMAL HIGH (ref 38–126)
Anion gap: 12 (ref 5–15)
BUN: 16 mg/dL (ref 6–20)
CO2: 22 mmol/L (ref 22–32)
Calcium: 9.2 mg/dL (ref 8.9–10.3)
Chloride: 102 mmol/L (ref 98–111)
Creatinine, Ser: 1.22 mg/dL — ABNORMAL HIGH (ref 0.44–1.00)
GFR, Estimated: 51 mL/min — ABNORMAL LOW (ref >60)
Glucose, Bld: 129 mg/dL — ABNORMAL HIGH (ref 70–99)
Potassium: 4 mmol/L (ref 3.5–5.1)
Sodium: 136 mmol/L (ref 135–145)
Total Bilirubin: 0.2 mg/dL — ABNORMAL LOW (ref 0.3–1.2)
Total Protein: 7.6 g/dL (ref 6.5–8.1)

## 2020-10-26 MED ORDER — ONDANSETRON HCL 8 MG PO TABS
ORAL_TABLET | ORAL | 1 refills | Status: DC
Start: 2020-10-26 — End: 2021-07-01

## 2020-10-26 MED ORDER — EXEMESTANE 25 MG PO TABS: 25.0000 mg | ORAL_TABLET | Freq: Every day | ORAL | 6 refills | Status: DC

## 2020-10-26 NOTE — Progress Notes (Signed)
Questions about anastrozole. Having some side effects since taking it. Hot flashes, not sleeping well, irritable, and sex is painful.

## 2020-10-26 NOTE — Patient Instructions (Signed)
#   STOP ANASTRAZOLE  # DO NOT START EXAMESTANE- until next visit

## 2020-10-26 NOTE — Progress Notes (Signed)
[facility] [place] NOTE  Patient Care Team: Tonia Ghent, MD as PCP - General (Family Medicine) Cammie Sickle, MD as Consulting Physician (Internal Medicine) Rolm Bookbinder, MD as Consulting Physician (General Surgery) Rico Junker, [name]  #  Oncology History Overview Note  # DEC 2020- LEFT BREAST-TWO PRIM ARIES:   #A]-IMC; ER/PR >90%; Her 2-IHC-2+; FISH-NEG-2 ; MRI- 3.7 CM; s/p s/p lumpectomy; s/p reexcision-final margins negative.  Stage Ib.    #B] Bx- IMC; G-1; ER/PR-pos; her2 NEG. s/p lumpectomy; margins negative [as per Dr.Wakefield; discussed at [facility] tumor conference]  # MARCH 31st 2021- START Taxotere [60 mg/m]; Cytoxan; growth factor x4 cycles; s/p radiation 04/29/2020  # MID-OCT 2021- Start anastrozole  #Extreme anxiety/chronic diarrhea/chronic severe hypokalemia/history of migraines/depression  Used estrogen and progesterone therapy: on premarin for post- menopausal x 2-3 years- discontinued in JAN 2021.   # SURVIVORSHIP:   DIAGNOSIS: Breast cancer  STAGE: IB & I     ;  GOALS: Cure  CURRENT/MOST RECENT THERAPY : Anastrozole    Carcinoma of upper-outer quadrant of left breast in female, estrogen receptor positive ([place])  08/14/2019 Initial Diagnosis   Carcinoma of upper-outer quadrant of left breast in female, estrogen receptor positive ([place])   09/10/2019 Genetic Testing   Negative genetic testing on the STAT and common hereditary cancer panel.  The Common Hereditary Gene Panel offered by [facility] includes sequencing and/or deletion duplication testing of the following 48 genes: APC, ATM, AXIN2, BARD1, BMPR1A, BRCA1, BRCA2, BRIP1, CDH1, CDK4, CDKN2A (p14ARF), CDKN2A (p16INK4a), CHEK2, CTNNA1, DICER1, EPCAM (Deletion/duplication testing only), GREM1 (promoter region deletion/duplication testing only), KIT, MEN1, MLH1, MSH2, MSH3, MSH6, MUTYH, NBN, NF1,  NHTL1, PALB2, PDGFRA, PMS2, POLD1, POLE, PTEN, RAD50, RAD51C, RAD51D, RNF43, SDHB, SDHC, SDHD, SMAD4, SMARCA4. STK11, TP53, TSC1, TSC2, and VHL.  The following genes were evaluated for sequence changes only: SDHA and HOXB13 c.251G>A variant only. The report date is September 10, 2019.   10/03/2019 Cancer Staging   Staging form: Breast, AJCC 8th Edition - Pathologic stage from 10/03/2019: Stage IB (pT2, pN1a, cM0, G2, ER+, PR+, HER2-) - Signed by Gardenia Phlegm, NP on 10/16/2019   10/09/2019 - 10/09/2019 Chemotherapy   The patient had dexamethasone (DECADRON) 4 MG tablet, 0 of 1 cycle, Start date: --, End date: -- DOXOrubicin (ADRIAMYCIN) chemo injection 98 mg, 60 mg/m2, Intravenous,  Once, 0 of 4 cycles PALONOSETRON HCL INJECTION 0.25 MG/5ML, 0.25 mg, Intravenous,  Once, 0 of 4 cycles pegfilgrastim-jmdb (FULPHILA) injection 6 mg, 6 mg, Subcutaneous,  Once, 0 of 4 cycles cyclophosphamide (CYTOXAN) 980 mg in sodium chloride 0.9 % 250 mL chemo infusion, 600 mg/m2, Intravenous,  Once, 0 of 4 cycles PACLitaxel (TAXOL) 132 mg in sodium chloride 0.9 % 250 mL chemo infusion (</= 59m/m2), 80 mg/m2, Intravenous,  Once, 0 of 12 cycles FOSAPREPITANT IV INFUSION 150 MG, 150 mg, Intravenous,  Once, 0 of 4 cycles  for chemotherapy treatment.    11/13/2019 -  Chemotherapy   The patient had dexamethasone (DECADRON) 4 MG tablet, 8 mg, Oral, 2 times daily, 1 of 1 cycle, Start date: --, End date: -- palonosetron (ALOXI) injection 0.25 mg, 0.25 mg, Intravenous,  Once, 4 of 4 cycles Administration: 0.25 mg (11/13/2019), 0.25 mg (12/04/2019), 0.25 mg (12/25/2019), 0.25 mg (01/15/2020) pegfilgrastim-jmdb (FULPHILA) injection 6 mg, 6 mg, Subcutaneous,  Once, 4 of 4 cycles Administration: 6 mg (11/14/2019), 6 mg (12/05/2019), 6 mg (12/26/2019), 6 mg (01/16/2020) cyclophosphamide (CYTOXAN)  1,000 mg in sodium chloride 0.9 % 250 mL chemo infusion, 980 mg, Intravenous,  Once, 4 of 4 cycles Administration: 1,000 mg (11/13/2019),  1,000 mg (12/04/2019), 1,000 mg (12/25/2019), 1,000 mg (01/15/2020) DOCEtaxel (TAXOTERE) 100 mg in sodium chloride 0.9 % 250 mL chemo infusion, 60 mg/m2 = 100 mg (100 % of original dose 60 mg/m2), Intravenous,  Once, 4 of 4 cycles Dose modification: 60 mg/m2 (original dose 60 mg/m2, Cycle 1, Reason: Provider Judgment) Administration: 100 mg (11/13/2019), 100 mg (12/04/2019), 100 mg (12/25/2019), 100 mg (01/15/2020)  for chemotherapy treatment.       HISTORY OF PRESENTING ILLNESS:  Jessica Barnes 60 y.o.  female with of breast cancer  [T2N1-ER/PR positive HER-2 negative; T1N1-ER/PR positive HER-2 negative 2 separate primaries] s/p adjuvant chemotherapy; also s/p adjuvant radiation is here for follow-up.  Patient is currently on adjuvant anastrozole.  Patient complains of significant hot flashes.  She also complains of severe fatigue.  Also complains of painful sexual intercourse.  She has tried Associate Professor gels-without any significant benefit.  Of note patient's Synthroid was dropped from 16mg to 75 mcg in December 2021-as a TSH was low at 0.9.  Review of Systems  Constitutional: Positive for malaise/fatigue. Negative for chills, diaphoresis, fever and weight loss.  HENT: Negative for nosebleeds and sore throat.   Eyes: Negative for double vision.  Respiratory: Negative for cough, hemoptysis, sputum production and wheezing.   Cardiovascular: Negative for chest pain, palpitations, orthopnea and leg swelling.  Gastrointestinal: Negative for abdominal pain, blood in stool, constipation, heartburn, melena, nausea and vomiting.  Genitourinary: Negative for dysuria, frequency and urgency.  Musculoskeletal: Negative for back pain and joint pain.  Neurological: Negative for dizziness, tingling, focal weakness and weakness.  Endo/Heme/Allergies: Does not bruise/bleed easily.  Psychiatric/Behavioral: The patient is nervous/anxious and has insomnia.      MEDICAL HISTORY:  Past  Medical History:  Diagnosis Date  . Anxiety 10/13/96  . Chest pain    atypical, hosp for that and depression 8/10-8/11/08  . [place] obstructive [place] disease[place]15[place]   by x-ray  . Depression 10/13/96   hospital 8/10-8/11/08.  notable history: son with TBI after MVA  . Family history of breast cancer   . Family history of colon cancer   . Family history of pancreatic cancer   . Hyperlipidemia 09/15/98  . Hypothyroidism 01/14/96  . Migraine    27 yoa  . NSVD (normal spontaneous vaginal delivery)    x 1  . Personal history of chemotherapy    finished 01/2020  . Personal history of radiation therapy    finished 04/2020  . PONV (postoperative nausea and vomiting)   . PTSD (post-traumatic stress disorder)    likely PTSD after son's MVA/TBI in 2007, s/p counseling    SURGICAL HISTORY: Past Surgical History:  Procedure Laterality Date  . APPENDECTOMY     60years of age  . BREAST BIOPSY Left 08/06/2019   uKoreabx/ ribbon clip/ invasive mammary carcinoma  . BREAST BIOPSY Left 09/06/2019   uKoreabx, heart clip, invasive mammary carcinoma   . BREAST LUMPECTOMY Left 10/21/2019   Inv ductal Ca and DCIS   . BREAST LUMPECTOMY WITH RADIOACTIVE SEED AND SENTINEL LYMPH NODE BIOPSY Left 10/01/2019   Procedure: LEFT BREAST LUMPECTOMY WITH BRACKETED RADIOACTIVE SEEDS AND LEFT AXILLARY SENTINEL LYMPH NODE BIOPSY;  Surgeon: WRolm Bookbinder MD;  [facility]  Service: General;  Laterality: Left;  . PORTACATH PLACEMENT Right 10/28/2019   Procedure: INSERTION  PORT-A-CATH WITH ULTRASOUND GUIDANCE;  Surgeon: Rolm Bookbinder, MD;  Location: [facility];  Service: General;  Laterality: Right;  . RE-EXCISION OF BREAST LUMPECTOMY Left 10/28/2019   Procedure: RE-EXCISION OF LEFT BREAST LUMPECTOMY;  Surgeon: Rolm Bookbinder, MD;  Location: [facility];  Service: General;  Laterality: Left;    SOCIAL HISTORY: Social History   Socioeconomic  History  . Marital status: Married    Spouse name: Not on file  . Number of children: Not on file  . Years of education: Not on file  . Highest education level: Not on file  Occupational History  . Not on file  Tobacco Use  . Smoking status: Former Smoker    Packs/day: 0.50    Years: 8.00    Pack years: 4.00    Types: Cigarettes  . Smokeless tobacco: Never Used  . Tobacco comment: quit 04/15/2014  Vaping Use  . Vaping Use: Every day  Substance and Sexual Activity  . Alcohol use: No    Alcohol/week: 0.0 standard drinks  . Drug use: No  . Sexual activity: Not on file  Other Topics Concern  . Not on file  Social History Narrative   Remarried, lives with husband 1 son, he had TBI and short term memory loss after prolonged illness      #Patient lives in [place] with her husband; prior history of smoking/currently vapes.  No significant alcohol use.  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    Social Determinants of Health   Financial Resource Strain: Not on file  Food Insecurity: Not on file  Transportation Needs: Not on file  Physical Activity: Not on file  Stress: Not on file  Social Connections: Not on file  Intimate Partner Violence: Not on file    FAMILY HISTORY: Family History  Problem Relation Age of Onset  . Hypertension Mother   . Colon cancer Father        d. 34  . Thyroid disease Sister   . Breast cancer Paternal Aunt   . Breast cancer Paternal Grandmother   . Colon cancer Paternal Grandmother   . Breast cancer Cousin        paternal aunt's daughter  . Colon cancer Paternal Uncle        3 pat uncles with colon cancer at unknown ages  . Pancreatic cancer Maternal Uncle        d. 3  . Breast cancer Paternal Aunt     ALLERGIES:  is allergic to sulfamethoxazole-trimethoprim and codeine.  MEDICATIONS:  Current Outpatient Medications  Medication Sig Dispense Refill  . BAC 50-325-40 MG tablet TAKE 1 TABLET BY MOUTH EVERY 4 TO 6 HOURS AS NEEDED FOR  HEADACHE 30 tablet 1  . Calcium Carbonate-Vit D-Min (CALCIUM 1200 PO) Take by mouth.    . diazepam (VALIUM) 10 MG tablet TAKE 1 TABLET BY MOUTH EVERY NIGHT AT BEDTIME AS NEEDED FOR ANXIETY. 30 tablet 2  . diphenoxylate-atropine (LOMOTIL) 2.5-0.025 MG tablet TAKE 1 TABLET BY MOUTH 4 TIMES A DAY AS NEEDED FOR DIARHEA OR LOOSE STOOLS. TAKE WITH IMMODIUM 30 tablet 0  . exemestane (AROMASIN) 25 MG tablet Take 1 tablet (25 mg total) by mouth daily after breakfast. 30 tablet 6  . FLUoxetine (PROZAC) 20 MG capsule TAKE 3 CAPSULES BY MOUTH DAILY 270 capsule 0  . ibuprofen (ADVIL) 100 MG tablet Take 100 mg by mouth every 6 (six) hours as needed for fever.     . levothyroxine (SYNTHROID) 75 MCG tablet Take  1 tablet each morning on empty stomach except for 0.5 tablet on Sundays. 100 tablet 1  . lidocaine-prilocaine (EMLA) cream Apply 1 application topically as needed. 30 g 0  . loperamide (IMODIUM A-D) 2 MG tablet Take 4 mg by mouth 4 (four) times daily as needed for diarrhea or loose stools.     . potassium chloride SA (KLOR-CON) 20 MEQ tablet TAKE 1 TABLET BY MOUTH ONCE A DAY    . ondansetron (ZOFRAN) 8 MG tablet One pill every 8 hours as needed for nausea/vomitting. 40 tablet 1   No current facility-administered medications for this visit.      Marland Kitchen  PHYSICAL EXAMINATION: ECOG PERFORMANCE STATUS: 0 - Asymptomatic  Vitals:   10/26/20 1437  BP: 121/74  Pulse: (!) 111  Resp: 16  Temp: (!) 97.3 F (36.3 C)  SpO2: 96%   Filed Weights   10/26/20 1437  Weight: 146 lb (66.2 kg)    Physical Exam Constitutional:      Comments: Accompanied by husband.  HENT:     Head: Normocephalic and atraumatic.     Mouth/Throat:     Pharynx: No oropharyngeal exudate.  Eyes:     Pupils: Pupils are equal, round, and reactive to light.  Cardiovascular:     Rate and Rhythm: Normal rate and regular rhythm.  Pulmonary:     Effort: Pulmonary effort is normal. No respiratory distress.     Breath sounds: Normal  breath sounds. No wheezing.  Abdominal:     General: Bowel sounds are normal. There is no distension.     Palpations: Abdomen is soft. There is no mass.     Tenderness: There is no abdominal tenderness. There is no guarding or rebound.  Musculoskeletal:        General: No tenderness. Normal range of motion.     Cervical back: Normal range of motion and neck supple.  Skin:    General: Skin is warm.  Neurological:     Mental Status: She is alert and oriented to person, place, and time.  Psychiatric:        Mood and Affect: Affect normal.     Comments: Anxious    LABORATORY DATA:  I have reviewed the data as listed Lab Results  Component Value Date   WBC 9.9 10/26/2020   HGB 13.0 10/26/2020   HCT 40.1 10/26/2020   MCV 96.4 10/26/2020   PLT 386 10/26/2020   Recent Labs    01/15/20 0846 02/25/20 1029 04/28/20 1011 06/29/20 1006 07/27/20 0954 10/26/20 1427  NA 137 136 138 135 136 136  K 3.8 4.0 3.5 3.8 3.6 4.0  CL 105 106 108 105 101 102  CO2 20* 23 21* 24 25 22   GLUCOSE 198* 96 112* 111* 106* 129*  BUN 14 13 14 17 16 16   CREATININE 0.76 0.87 0.77 0.80 0.82 1.22*  CALCIUM 9.4 8.7* 9.0 9.0 8.9 9.2  GFRNONAA >60 >60 >60 >60 >60 51*  GFRAA >60 >60 >60  --   --   --   PROT 7.5 6.8 7.1 7.0 7.1 7.6  ALBUMIN 4.2 3.9 3.9 3.7 3.8 4.1  AST 27 17 20 19 20  32  ALT 28 14 17 17 15 28   ALKPHOS 105 118 104 105 136* 134*  BILITOT 0.7 0.4 0.5 0.7 0.3 0.2*    RADIOGRAPHIC STUDIES: I have personally reviewed the radiological images as listed and agreed with the findings in the report. No results found.  ASSESSMENT & PLAN:   Carcinoma  of upper-outer quadrant of left breast in female, estrogen receptor positive ([place]) # Left breast cancer-2 separate primaries- A] T2N1-ER/PR positive HER-2 negative;  B] T1N1 ER/PR positive HER-2 negative. S/p 4 cycles of adjuvant chemotherapy Taxotere [60 mg/m] Cytoxan. S/p  RT [last 9/15].  Stable evidence of recurrence.  Discuss adj.  Zometa.  #Patient currently on anastrozole tolerating poorly [hot flashes extreme fatigue/dyspareunia-see below].  I would recommend holding anastrozole for the next 1 month.  Discussed switching to exemestane; new prescription started-however patient will not start until next visit.  # Chronic hypokalemia/diarrhea-potassium 4.0 stable.  Continue K-Dur.    # Chronic intermittent nausea- 1-2 a month- improved on Zofran. New refill.  # Elevated LFTs- alkaline phosphatase-134 stable.  # BMD- T score= 0.9; continue Vit D [ca-constipation]; stable.  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   # Fatigue-dec TSH- 0.9; decreased the dose to 85mg/day-  re-check TSH.   # Insomnia/likely secondary to anxiety- on valium improved.  Stable continue Valium.  # DISPOSITION: check TSH #follow up in 1 months-MD; No Labs---Dr.B  All questions were answered. The patient/family knows to call the clinic with any problems, questions or concerns.    GCammie Sickle MD 10/26/2020 3:26 PM

## 2020-10-26 NOTE — Assessment & Plan Note (Addendum)
#  Left breast cancer-2 separate primaries- A] T2N1-ER/PR positive HER-2 negative;  B] T1N1 ER/PR positive HER-2 negative. S/p 4 cycles of adjuvant chemotherapy Taxotere [60 mg/m] Cytoxan. S/p  RT [last 9/15].  Stable evidence of recurrence.  Discuss adj. Zometa.  #Patient currently on anastrozole tolerating poorly [hot flashes extreme fatigue/dyspareunia-see below].  I would recommend holding anastrozole for the next 1 month.  Discussed switching to exemestane; new prescription started-however patient will not start until next visit.  # Chronic hypokalemia/diarrhea-potassium 4.0 stable.  Continue K-Dur.    # Chronic intermittent nausea- 1-2 a month- improved on Zofran. New refill.  # Elevated LFTs- alkaline phosphatase-134 stable.  # BMD- T score= 0.9; continue Vit D [ca-constipation]; stable.  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   # Fatigue-dec TSH- 0.9; decreased the dose to 71mg/day-  re-check TSH.   # Insomnia/likely secondary to anxiety- on valium improved.  Stable continue Valium.  # DISPOSITION: check TSH #follow up in 1 months-MD; No Labs---Dr.B

## 2020-11-23 ENCOUNTER — Inpatient Hospital Stay: Payer: BC Managed Care – PPO | Attending: Internal Medicine | Admitting: Internal Medicine

## 2020-11-23 ENCOUNTER — Encounter: Payer: Self-pay | Admitting: Internal Medicine

## 2020-11-23 VITALS — BP 123/70 | HR 97 | Temp 97.8°F | Resp 20 | Wt 143.6 lb

## 2020-11-23 DIAGNOSIS — K5900 Constipation, unspecified: Secondary | ICD-10-CM | POA: Insufficient documentation

## 2020-11-23 DIAGNOSIS — M2550 Pain in unspecified joint: Secondary | ICD-10-CM | POA: Insufficient documentation

## 2020-11-23 DIAGNOSIS — G4700 Insomnia, unspecified: Secondary | ICD-10-CM | POA: Insufficient documentation

## 2020-11-23 DIAGNOSIS — Z9221 Personal history of antineoplastic chemotherapy: Secondary | ICD-10-CM | POA: Insufficient documentation

## 2020-11-23 DIAGNOSIS — R110 Nausea: Secondary | ICD-10-CM | POA: Insufficient documentation

## 2020-11-23 DIAGNOSIS — R7989 Other specified abnormal findings of blood chemistry: Secondary | ICD-10-CM | POA: Diagnosis not present

## 2020-11-23 DIAGNOSIS — Z79899 Other long term (current) drug therapy: Secondary | ICD-10-CM | POA: Insufficient documentation

## 2020-11-23 DIAGNOSIS — E876 Hypokalemia: Secondary | ICD-10-CM | POA: Diagnosis not present

## 2020-11-23 DIAGNOSIS — E039 Hypothyroidism, unspecified: Secondary | ICD-10-CM | POA: Diagnosis not present

## 2020-11-23 DIAGNOSIS — Z87891 Personal history of nicotine dependence: Secondary | ICD-10-CM | POA: Diagnosis not present

## 2020-11-23 DIAGNOSIS — Z803 Family history of malignant neoplasm of breast: Secondary | ICD-10-CM | POA: Insufficient documentation

## 2020-11-23 DIAGNOSIS — R197 Diarrhea, unspecified: Secondary | ICD-10-CM | POA: Insufficient documentation

## 2020-11-23 DIAGNOSIS — J449 Chronic obstructive pulmonary disease, unspecified: Secondary | ICD-10-CM | POA: Insufficient documentation

## 2020-11-23 DIAGNOSIS — I890 Lymphedema, not elsewhere classified: Secondary | ICD-10-CM | POA: Diagnosis not present

## 2020-11-23 DIAGNOSIS — Z800 Family history of malignant neoplasm of digestive organs: Secondary | ICD-10-CM | POA: Insufficient documentation

## 2020-11-23 DIAGNOSIS — C50412 Malignant neoplasm of upper-outer quadrant of left female breast: Secondary | ICD-10-CM | POA: Diagnosis not present

## 2020-11-23 DIAGNOSIS — Z923 Personal history of irradiation: Secondary | ICD-10-CM | POA: Insufficient documentation

## 2020-11-23 DIAGNOSIS — E785 Hyperlipidemia, unspecified: Secondary | ICD-10-CM | POA: Insufficient documentation

## 2020-11-23 DIAGNOSIS — Z170 Estrogen receptor positive status [ER+]: Secondary | ICD-10-CM

## 2020-11-23 DIAGNOSIS — Z79811 Long term (current) use of aromatase inhibitors: Secondary | ICD-10-CM | POA: Insufficient documentation

## 2020-11-23 DIAGNOSIS — R609 Edema, unspecified: Secondary | ICD-10-CM | POA: Diagnosis not present

## 2020-11-23 NOTE — Progress Notes (Signed)
Patient states she has been having some right breast pain and right armpit pain within the last week.

## 2020-11-23 NOTE — Progress Notes (Signed)
[facility] NOTE  Patient Care Team: Tonia Ghent, MD as PCP - General (Family Medicine) Cammie Sickle, MD as Consulting Physician (Internal Medicine) Rolm Bookbinder, MD as Consulting Physician (General Surgery) Rico Junker, [name]  #  Oncology History Overview Note  # DEC 2020- LEFT BREAST-TWO PRIM ARIES:   #A]-IMC; ER/PR >90%; Her 2-IHC-2+; FISH-NEG-2 ; MRI- 3.7 CM; s/p s/p lumpectomy; s/p reexcision-final margins negative.  Stage Ib.    #B] Bx- IMC; G-1; ER/PR-pos; her2 NEG. s/p lumpectomy; margins negative [as per Dr.Wakefield; discussed at [facility] tumor conference]  # MARCH 31st 2021- START Taxotere [60 mg/m]; Cytoxan; growth factor x4 cycles; s/p radiation 04/29/2020  # MID-OCT 2021- Start anastrozole; MID MARCH 2022- stopped sec to intolerance; MID April 20222- START AROMASIN  #Extreme anxiety/chronic diarrhea/chronic severe hypokalemia/history of migraines/depression  Used estrogen and progesterone therapy: on premarin for post- menopausal x 2-3 years- discontinued in JAN 2021.   # SURVIVORSHIP:   DIAGNOSIS: Breast cancer  STAGE: IB & I     ;  GOALS: Cure  CURRENT/MOST RECENT THERAPY : Anastrozole    Carcinoma of upper-outer quadrant of left breast in female, estrogen receptor positive ([place])  08/14/2019 Initial Diagnosis   Carcinoma of upper-outer quadrant of left breast in female, estrogen receptor positive ([place])   09/10/2019 Genetic Testing   Negative genetic testing on the STAT and common hereditary cancer panel.  The Common Hereditary Gene Panel offered by [facility] includes sequencing and/or deletion duplication testing of the following 48 genes: APC, ATM, AXIN2, BARD1, BMPR1A, BRCA1, BRCA2, BRIP1, CDH1, CDK4, CDKN2A (p14ARF), CDKN2A (p16INK4a), CHEK2, CTNNA1, DICER1, EPCAM (Deletion/duplication testing only), GREM1 (promoter region  deletion/duplication testing only), KIT, MEN1, MLH1, MSH2, MSH3, MSH6, MUTYH, NBN, NF1, NHTL1, PALB2, PDGFRA, PMS2, POLD1, POLE, PTEN, RAD50, RAD51C, RAD51D, RNF43, SDHB, SDHC, SDHD, SMAD4, SMARCA4. STK11, TP53, TSC1, TSC2, and VHL.  The following genes were evaluated for sequence changes only: SDHA and HOXB13 c.251G>A variant only. The report date is September 10, 2019.   10/03/2019 Cancer Staging   Staging form: Breast, AJCC 8th Edition - Pathologic stage from 10/03/2019: Stage IB (pT2, pN1a, cM0, G2, ER+, PR+, HER2-) - Signed by Gardenia Phlegm, NP on 10/16/2019   10/09/2019 - 10/09/2019 Chemotherapy   The patient had dexamethasone (DECADRON) 4 MG tablet, 0 of 1 cycle, Start date: --, End date: -- DOXOrubicin (ADRIAMYCIN) chemo injection 98 mg, 60 mg/m2, Intravenous,  Once, 0 of 4 cycles PALONOSETRON HCL INJECTION 0.25 MG/5ML, 0.25 mg, Intravenous,  Once, 0 of 4 cycles pegfilgrastim-jmdb (FULPHILA) injection 6 mg, 6 mg, Subcutaneous,  Once, 0 of 4 cycles cyclophosphamide (CYTOXAN) 980 mg in sodium chloride 0.9 % 250 mL chemo infusion, 600 mg/m2, Intravenous,  Once, 0 of 4 cycles PACLitaxel (TAXOL) 132 mg in sodium chloride 0.9 % 250 mL chemo infusion (</= 44m/m2), 80 mg/m2, Intravenous,  Once, 0 of 12 cycles FOSAPREPITANT IV INFUSION 150 MG, 150 mg, Intravenous,  Once, 0 of 4 cycles  for chemotherapy treatment.    11/13/2019 -  Chemotherapy   The patient had dexamethasone (DECADRON) 4 MG tablet, 8 mg, Oral, 2 times daily, 1 of 1 cycle, Start date: --, End date: -- palonosetron (ALOXI) injection 0.25 mg, 0.25 mg, Intravenous,  Once, 4 of 4 cycles Administration: 0.25 mg (11/13/2019), 0.25 mg (12/04/2019), 0.25 mg (12/25/2019), 0.25 mg (01/15/2020) pegfilgrastim-jmdb (FULPHILA) injection 6 mg, 6 mg, Subcutaneous,  Once, 4 of 4 cycles Administration: 6 mg (  11/14/2019), 6 mg (12/05/2019), 6 mg (12/26/2019), 6 mg (01/16/2020) cyclophosphamide (CYTOXAN) 1,000 mg in sodium chloride 0.9 % 250 mL chemo  infusion, 980 mg, Intravenous,  Once, 4 of 4 cycles Administration: 1,000 mg (11/13/2019), 1,000 mg (12/04/2019), 1,000 mg (12/25/2019), 1,000 mg (01/15/2020) DOCEtaxel (TAXOTERE) 100 mg in sodium chloride 0.9 % 250 mL chemo infusion, 60 mg/m2 = 100 mg (100 % of original dose 60 mg/m2), Intravenous,  Once, 4 of 4 cycles Dose modification: 60 mg/m2 (original dose 60 mg/m2, Cycle 1, Reason: Provider Judgment) Administration: 100 mg (11/13/2019), 100 mg (12/04/2019), 100 mg (12/25/2019), 100 mg (01/15/2020)  for chemotherapy treatment.       HISTORY OF PRESENTING ILLNESS:  Jessica Barnes 60 y.o.  female with of breast cancer  [T2N1-ER/PR positive HER-2 negative; T1N1-ER/PR positive HER-2 negative 2 separate primaries] s/p adjuvant chemotherapy; also s/p adjuvant radiation is here for follow-up.    Patient is currently  OFF adjuvant anastrozole foe the last 1 month because of intolerance.  Patient is hot flashes have improved since stopping anastrozole.  However continues to have fatigue.  Continues to have joint pains.  Review of Systems  Constitutional: Positive for malaise/fatigue. Negative for chills, diaphoresis, fever and weight loss.  HENT: Negative for nosebleeds and sore throat.   Eyes: Negative for double vision.  Respiratory: Negative for cough, hemoptysis, sputum production and wheezing.   Cardiovascular: Negative for chest pain, palpitations, orthopnea and leg swelling.  Gastrointestinal: Negative for abdominal pain, blood in stool, constipation, heartburn, melena, nausea and vomiting.  Genitourinary: Negative for dysuria, frequency and urgency.  Musculoskeletal: Negative for back pain and joint pain.  Neurological: Negative for dizziness, tingling, focal weakness and weakness.  Endo/Heme/Allergies: Does not bruise/bleed easily.  Psychiatric/Behavioral: The patient is nervous/anxious and has insomnia.      MEDICAL HISTORY:  Past Medical History:  Diagnosis Date  . Anxiety  10/13/96  . Chest pain    atypical, hosp for that and depression 8/10-8/11/08  . COPD (chronic obstructive pulmonary disease) ([place]) 09/15/05   by x-ray  . Depression 10/13/96   hospital 8/10-8/11/08.  notable history: son with TBI after MVA  . Family history of breast cancer   . Family history of colon cancer   . Family history of pancreatic cancer   . Hyperlipidemia 09/15/98  . Hypothyroidism 01/14/96  . Migraine    27 yoa  . NSVD (normal spontaneous vaginal delivery)    x 1  . Personal history of chemotherapy    finished 01/2020  . Personal history of radiation therapy    finished 04/2020  . PONV (postoperative nausea and vomiting)   . PTSD (post-traumatic stress disorder)    likely PTSD after son's MVA/TBI in 2007, s/p counseling    SURGICAL HISTORY: Past Surgical History:  Procedure Laterality Date  . APPENDECTOMY     60 years of age  . BREAST BIOPSY Left 08/06/2019   [place] bx/ ribbon clip/ invasive mammary carcinoma  . BREAST BIOPSY Left 09/06/2019   [place] bx, heart clip, invasive mammary carcinoma   . BREAST LUMPECTOMY Left 10/21/2019   Inv ductal Ca and DCIS   . BREAST LUMPECTOMY WITH RADIOACTIVE SEED AND SENTINEL LYMPH NODE BIOPSY Left 10/01/2019   Procedure: LEFT BREAST LUMPECTOMY WITH BRACKETED RADIOACTIVE SEEDS AND LEFT AXILLARY SENTINEL LYMPH NODE BIOPSY;  Surgeon: Rolm Bookbinder, MD;  Location: [facility];  Service: General;  Laterality: Left;  . PORTACATH PLACEMENT Right 10/28/2019   Procedure: INSERTION PORT-A-CATH WITH ULTRASOUND GUIDANCE;  Surgeon: Donne Hazel,  Rodman Key, MD;  Location: [facility];  Service: General;  Laterality: Right;  . RE-EXCISION OF BREAST LUMPECTOMY Left 10/28/2019   Procedure: RE-EXCISION OF LEFT BREAST LUMPECTOMY;  Surgeon: Rolm Bookbinder, MD;  Location: [facility];  Service: General;  Laterality: Left;    SOCIAL HISTORY: Social History   Socioeconomic History  . Marital status: Married    Spouse  name: Not on file  . Number of children: Not on file  . Years of education: Not on file  . Highest education level: Not on file  Occupational History  . Not on file  Tobacco Use  . Smoking status: Former Smoker    Packs/day: 0.50    Years: 8.00    Pack years: 4.00    Types: Cigarettes  . Smokeless tobacco: Never Used  . Tobacco comment: quit 04/15/2014  Vaping Use  . Vaping Use: Every day  Substance and Sexual Activity  . Alcohol use: No    Alcohol/week: 0.0 standard drinks  . Drug use: No  . Sexual activity: Not on file  Other Topics Concern  . Not on file  Social History Narrative   Remarried, lives with husband 1 son, he had TBI and short term memory loss after prolonged illness      #Patient lives in [place] with her husband; prior history of smoking/currently vapes.  No significant alcohol use.  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    Social Determinants of Health   Financial Resource Strain: Not on file  Food Insecurity: Not on file  Transportation Needs: Not on file  Physical Activity: Not on file  Stress: Not on file  Social Connections: Not on file  Intimate Partner Violence: Not on file    FAMILY HISTORY: Family History  Problem Relation Age of Onset  . Hypertension Mother   . Colon cancer Father        d. 30  . Thyroid disease Sister   . Breast cancer Paternal Aunt   . Breast cancer Paternal Grandmother   . Colon cancer Paternal Grandmother   . Breast cancer Cousin        paternal aunt's daughter  . Colon cancer Paternal Uncle        3 pat uncles with colon cancer at unknown ages  . Pancreatic cancer Maternal Uncle        d. 67  . Breast cancer Paternal Aunt     ALLERGIES:  is allergic to sulfamethoxazole-trimethoprim and codeine.  MEDICATIONS:  Current Outpatient Medications  Medication Sig Dispense Refill  . BAC 50-325-40 MG tablet TAKE 1 TABLET BY MOUTH EVERY 4 TO 6 HOURS AS NEEDED FOR HEADACHE 30 tablet 1  . Calcium Carbonate-Vit  D-Min (CALCIUM 1200 PO) Take by mouth.    . diazepam (VALIUM) 10 MG tablet TAKE 1 TABLET BY MOUTH EVERY NIGHT AT BEDTIME AS NEEDED FOR ANXIETY. 30 tablet 2  . diphenoxylate-atropine (LOMOTIL) 2.5-0.025 MG tablet TAKE 1 TABLET BY MOUTH 4 TIMES A DAY AS NEEDED FOR DIARHEA OR LOOSE STOOLS. TAKE WITH IMMODIUM 30 tablet 0  . exemestane (AROMASIN) 25 MG tablet Take 1 tablet (25 mg total) by mouth daily after breakfast. 30 tablet 6  . FLUoxetine (PROZAC) 20 MG capsule TAKE 3 CAPSULES BY MOUTH DAILY 270 capsule 0  . ibuprofen (ADVIL) 100 MG tablet Take 100 mg by mouth every 6 (six) hours as needed for fever.     . levothyroxine (SYNTHROID) 75 MCG tablet Take 1 tablet each morning on empty stomach  except for 0.5 tablet on Sundays. 100 tablet 1  . lidocaine-prilocaine (EMLA) cream Apply 1 application topically as needed. 30 g 0  . loperamide (IMODIUM A-D) 2 MG tablet Take 4 mg by mouth 4 (four) times daily as needed for diarrhea or loose stools.     . ondansetron (ZOFRAN) 8 MG tablet One pill every 8 hours as needed for nausea/vomitting. 40 tablet 1  . potassium chloride SA (KLOR-CON) 20 MEQ tablet TAKE 1 TABLET BY MOUTH ONCE A DAY     No current facility-administered medications for this visit.      Marland Kitchen  PHYSICAL EXAMINATION: ECOG PERFORMANCE STATUS: 0 - Asymptomatic  Vitals:   11/23/20 1442  BP: 123/70  Pulse: 97  Resp: 20  Temp: 97.8 F (36.6 C)  SpO2: 99%   Filed Weights   11/23/20 1442  Weight: 143 lb 9.6 oz (65.1 kg)    Physical Exam Constitutional:      Comments: Accompanied by husband.  HENT:     Head: Normocephalic and atraumatic.     Mouth/Throat:     Pharynx: No oropharyngeal exudate.  Eyes:     Pupils: Pupils are equal, round, and reactive to light.  Cardiovascular:     Rate and Rhythm: Normal rate and regular rhythm.  Pulmonary:     Effort: Pulmonary effort is normal. No respiratory distress.     Breath sounds: Normal breath sounds. No wheezing.  Abdominal:      General: Bowel sounds are normal. There is no distension.     Palpations: Abdomen is soft. There is no mass.     Tenderness: There is no abdominal tenderness. There is no guarding or rebound.  Musculoskeletal:        General: No tenderness. Normal range of motion.     Cervical back: Normal range of motion and neck supple.  Skin:    General: Skin is warm.  Neurological:     Mental Status: She is alert and oriented to person, place, and time.  Psychiatric:        Mood and Affect: Affect normal.     Comments: Anxious    LABORATORY DATA:  I have reviewed the data as listed Lab Results  Component Value Date   WBC 9.9 10/26/2020   HGB 13.0 10/26/2020   HCT 40.1 10/26/2020   MCV 96.4 10/26/2020   PLT 386 10/26/2020   Recent Labs    01/15/20 0846 02/25/20 1029 04/28/20 1011 06/29/20 1006 07/27/20 0954 10/26/20 1427  NA 137 136 138 135 136 136  K 3.8 4.0 3.5 3.8 3.6 4.0  CL 105 106 108 105 101 102  CO2 20* 23 21* 24 25 22   GLUCOSE 198* 96 112* 111* 106* 129*  BUN 14 13 14 17 16 16   CREATININE 0.76 0.87 0.77 0.80 0.82 1.22*  CALCIUM 9.4 8.7* 9.0 9.0 8.9 9.2  GFRNONAA >60 >60 >60 >60 >60 51*  GFRAA >60 >60 >60  --   --   --   PROT 7.5 6.8 7.1 7.0 7.1 7.6  ALBUMIN 4.2 3.9 3.9 3.7 3.8 4.1  AST 27 17 20 19 20  32  ALT 28 14 17 17 15 28   ALKPHOS 105 118 104 105 136* 134*  BILITOT 0.7 0.4 0.5 0.7 0.3 0.2*    RADIOGRAPHIC STUDIES: I have personally reviewed the radiological images as listed and agreed with the findings in the report. No results found.  ASSESSMENT & PLAN:   Carcinoma of upper-outer quadrant of left breast in  female, estrogen receptor positive ([place]) # Left breast cancer-2 separate primaries- A] T2N1-ER/PR positive HER-2 negative;  B] T1N1 ER/PR positive HER-2 negative. S/p 4 cycles of adjuvant chemotherapy Taxotere [60 mg/m] Cytoxan. S/p  RT [last 9/15].  STABLE; No evidence of recurrence.  Discuss adj. Zometa.  #Given intolerance to anastrozole discontinue  anastrozole.  Plan start Aromasin.  Again educated about the potential side effects including but not limited to fatigue/joint aches etc.  # Chronic hypokalemia/diarrhea-potassium 4.0 stable.  Continue K-Dur.    #Swelling under the left underarm/chest wall-likely lymphedema.  Recommend referral to physical therapy.  # Chronic intermittent nausea- 1-2 a month- improved on Zofran. New refill.  # Elevated LFTs- alkaline phosphatase-134 stable.  # BMD- T score= 0.9; continue Vit D [ca-constipation]; STABLE.   # Fatigue-dec TSH- 0.9; Decreased the dose to 89mg/day. Defer to PCP.   # Insomnia/likely secondary to anxiety- on valium improved.    # DISPOSITION:  # refer to lymphedema PT re; breast cancer #follow up in 1 months-MD- virtual;--Dr.B  All questions were answered. The patient/family knows to call the clinic with any problems, questions or concerns.    GCammie Sickle MD 11/24/2020 10:07 PM

## 2020-11-23 NOTE — Assessment & Plan Note (Addendum)
#  Left breast cancer-2 separate primaries- A] T2N1-ER/PR positive HER-2 negative;  B] T1N1 ER/PR positive HER-2 negative. S/p 4 cycles of adjuvant chemotherapy Taxotere [60 mg/m] Cytoxan. S/p  RT [last 9/15].  STABLE; No evidence of recurrence.  Discuss adj. Zometa.  #Given intolerance to anastrozole discontinue anastrozole.  Plan start Aromasin.  Again educated about the potential side effects including but not limited to fatigue/joint aches etc.  # Chronic hypokalemia/diarrhea-potassium 4.0 stable.  Continue K-Dur.    #Swelling under the left underarm/chest wall-likely lymphedema.  Recommend referral to physical therapy.  # Chronic intermittent nausea- 1-2 a month- improved on Zofran. New refill.  # Elevated LFTs- alkaline phosphatase-134 stable.  # BMD- T score= 0.9; continue Vit D [ca-constipation]; STABLE.   # Fatigue-dec TSH- 0.9; Decreased the dose to 36mg/day. Defer to PCP.   # Insomnia/likely secondary to anxiety- on valium improved.    # DISPOSITION:  # refer to lymphedema PT re; breast cancer #follow up in 1 months-MD- virtual;--Dr.B

## 2020-11-30 ENCOUNTER — Ambulatory Visit: Payer: BC Managed Care – PPO | Admitting: Radiation Oncology

## 2020-12-07 ENCOUNTER — Other Ambulatory Visit: Payer: Self-pay | Admitting: Family Medicine

## 2020-12-07 NOTE — Telephone Encounter (Signed)
Refill request BAC Last refill 10/09/20 #30/1 Last office visit 07/02/20 video No upcoming appointment scheduled

## 2020-12-08 NOTE — Telephone Encounter (Signed)
Sent. Thanks.   

## 2020-12-21 ENCOUNTER — Inpatient Hospital Stay: Payer: BC Managed Care – PPO | Attending: Internal Medicine | Admitting: Internal Medicine

## 2020-12-21 ENCOUNTER — Other Ambulatory Visit: Payer: Self-pay

## 2020-12-21 DIAGNOSIS — C50412 Malignant neoplasm of upper-outer quadrant of left female breast: Secondary | ICD-10-CM

## 2020-12-21 DIAGNOSIS — Z170 Estrogen receptor positive status [ER+]: Secondary | ICD-10-CM

## 2020-12-21 NOTE — Progress Notes (Signed)
I connected with Jessica Barnes on 12/21/20 at  3:15 PM EDT by telephone visit and verified that I am speaking with the correct person using two identifiers.  I discussed the limitations, risks, security and privacy concerns of performing an evaluation and management service by telemedicine and the availability of in-person appointments. I also discussed with the patient that there may be a patient responsible charge related to this service. The patient expressed understanding and agreed to proceed.  Patient is scheduled for video visit; however because of technical reasons switched over to telephone visit.   Other persons participating in the visit and their role in the encounter: RN/medical reconciliation Patient's location: home Provider's location: office  Oncology History Overview Note  # DEC 2020- LEFT BREAST-TWO PRIM ARIES:   #A]-IMC; ER/PR >90%; Her 2-IHC-2+; FISH-NEG-2 ; MRI- 3.7 CM; s/p s/p lumpectomy; s/p reexcision-final margins negative.  Stage Ib.    #B] Bx- IMC; G-1; ER/PR-pos; her2 NEG. s/p lumpectomy; margins negative [as per Dr.Wakefield; discussed at [facility] tumor conference]  # MARCH 31st 2021- START Taxotere [60 mg/m]; Cytoxan; growth factor x4 cycles; s/p radiation 04/29/2020  # MID-OCT 2021- Start anastrozole; MID MARCH 2022- stopped sec to intolerance; MID April 20222- START AROMASIN  #Extreme anxiety/chronic diarrhea/chronic severe hypokalemia/history of migraines/depression  Used estrogen and progesterone therapy: on premarin for post- menopausal x 2-3 years- discontinued in JAN 2021.   # SURVIVORSHIP:   DIAGNOSIS: Breast cancer  STAGE: IB & I     ;  GOALS: Cure  CURRENT/MOST RECENT THERAPY : Anastrozole    Carcinoma of upper-outer quadrant of left breast in female, estrogen receptor positive ([place])  08/14/2019 Initial Diagnosis   Carcinoma of upper-outer quadrant of left breast in female, estrogen receptor positive ([place])   09/10/2019 Genetic  Testing   Negative genetic testing on the STAT and common hereditary cancer panel.  The Common Hereditary Gene Panel offered by [facility] includes sequencing and/or deletion duplication testing of the following 48 genes: APC, ATM, AXIN2, BARD1, BMPR1A, BRCA1, BRCA2, BRIP1, CDH1, CDK4, CDKN2A (p14ARF), CDKN2A (p16INK4a), CHEK2, CTNNA1, DICER1, EPCAM (Deletion/duplication testing only), GREM1 (promoter region deletion/duplication testing only), KIT, MEN1, MLH1, MSH2, MSH3, MSH6, MUTYH, NBN, NF1, NHTL1, PALB2, PDGFRA, PMS2, POLD1, POLE, PTEN, RAD50, RAD51C, RAD51D, RNF43, SDHB, SDHC, SDHD, SMAD4, SMARCA4. STK11, TP53, TSC1, TSC2, and VHL.  The following genes were evaluated for sequence changes only: SDHA and HOXB13 c.251G>A variant only. The report date is September 10, 2019.   10/03/2019 Cancer Staging   Staging form: Breast, AJCC 8th Edition - Pathologic stage from 10/03/2019: Stage IB (pT2, pN1a, cM0, G2, ER+, PR+, HER2-) - Signed by Gardenia Phlegm, NP on 10/16/2019   10/09/2019 - 10/09/2019 Chemotherapy   The patient had dexamethasone (DECADRON) 4 MG tablet, 0 of 1 cycle, Start date: --, End date: -- DOXOrubicin (ADRIAMYCIN) chemo injection 98 mg, 60 mg/m2, Intravenous,  Once, 0 of 4 cycles PALONOSETRON HCL INJECTION 0.25 MG/5ML, 0.25 mg, Intravenous,  Once, 0 of 4 cycles pegfilgrastim-jmdb (FULPHILA) injection 6 mg, 6 mg, Subcutaneous,  Once, 0 of 4 cycles cyclophosphamide (CYTOXAN) 980 mg in sodium chloride 0.9 % 250 mL chemo infusion, 600 mg/m2, Intravenous,  Once, 0 of 4 cycles PACLitaxel (TAXOL) 132 mg in sodium chloride 0.9 % 250 mL chemo infusion (</= 28m/m2), 80 mg/m2, Intravenous,  Once, 0 of 12 cycles FOSAPREPITANT IV INFUSION 150 MG, 150 mg, Intravenous,  Once, 0 of 4 cycles  for chemotherapy treatment.    11/13/2019 -  Chemotherapy   The patient  had dexamethasone (DECADRON) 4 MG tablet, 8 mg, Oral, 2 times daily, 1 of 1 cycle, Start date: --, End date: -- palonosetron (ALOXI)  injection 0.25 mg, 0.25 mg, Intravenous,  Once, 4 of 4 cycles Administration: 0.25 mg (11/13/2019), 0.25 mg (12/04/2019), 0.25 mg (12/25/2019), 0.25 mg (01/15/2020) pegfilgrastim-jmdb (FULPHILA) injection 6 mg, 6 mg, Subcutaneous,  Once, 4 of 4 cycles Administration: 6 mg (11/14/2019), 6 mg (12/05/2019), 6 mg (12/26/2019), 6 mg (01/16/2020) cyclophosphamide (CYTOXAN) 1,000 mg in sodium chloride 0.9 % 250 mL chemo infusion, 980 mg, Intravenous,  Once, 4 of 4 cycles Administration: 1,000 mg (11/13/2019), 1,000 mg (12/04/2019), 1,000 mg (12/25/2019), 1,000 mg (01/15/2020) DOCEtaxel (TAXOTERE) 100 mg in sodium chloride 0.9 % 250 mL chemo infusion, 60 mg/m2 = 100 mg (100 % of original dose 60 mg/m2), Intravenous,  Once, 4 of 4 cycles Dose modification: 60 mg/m2 (original dose 60 mg/m2, Cycle 1, Reason: Provider Judgment) Administration: 100 mg (11/13/2019), 100 mg (12/04/2019), 100 mg (12/25/2019), 100 mg (01/15/2020)  for chemotherapy treatment.       Chief Complaint: Breast cancer   History of present illness:Jessica Barnes 60 y.o.  female with history of breast cancer-ER/PR positive HER2 negative stage I currently on Aromasin is here for follow-up.  Patient discontinued anastrozole approximately a month ago because of worsening fatigue/joint pains.  Patient has been on Aromasin tolerating it fairly well continues to have mild joint pains body aches however not significantly worse.  Chronic fatigue.  Not any worse.   Assessment and plan: Carcinoma of upper-outer quadrant of left breast in female, estrogen receptor positive ([place]) # Left breast cancer-2 separate primaries- A] T2N1-ER/PR positive HER-2 negative;  B] T1N1 ER/PR positive HER-2 negative. S/p 4 cycles of adjuvant chemotherapy Taxotere [60 mg/m] Cytoxan. S/p  RT [last 9/15].  STABLE; No evidence of recurrence.  Discuss adj. Zometa.  # currently on Aromasin-tolerating with mild to moderate side effects.  Continue Aromasin for now.  # Chronic  hypokalemia/diarrhea-potassium 4.0; on Kdur- STABLE.   #Swelling under the left underarm/chest wall-likely lymphedema- awaiting on physical therapy.  # Chronic intermittent nausea- 1-2 a month- improved on Zofran.STABLE  # Elevated LFTs- alkaline phosphatase-134; STABLE.   # BMD- T score= 0.9; continue Vit D [ca-constipation]; STABLE.    # Fatigue-dec TSH- 0.9; Decreased the dose to 72mg/day. Defer to PCP.   # Insomnia/likely secondary to anxiety- on valium improved.    # DISPOSITION:  #follow up in 2 months-MD; labs- cbc/cmp-Dr.B  Follow-up instructions:  I discussed the assessment and treatment plan with the patient.  The patient was provided an opportunity to ask questions and all were answered.  The patient agreed with the plan and demonstrated understanding of instructions.  The patient was advised to call back or seek an in person evaluation if the symptoms worsen or if the condition fails to improve as anticipated.  Dr. GCharlaine DaltonCWest Terre Hauteat [facility]il[facility]5/04/2021 4:06 PM

## 2020-12-21 NOTE — Assessment & Plan Note (Signed)
#  Left breast cancer-2 separate primaries- A] T2N1-ER/PR positive HER-2 negative;  B] T1N1 ER/PR positive HER-2 negative. S/p 4 cycles of adjuvant chemotherapy Taxotere [60 mg/m] Cytoxan. S/p  RT [last 9/15].  STABLE; No evidence of recurrence.  Discuss adj. Zometa.  # currently on Aromasin-tolerating with mild to moderate side effects.  Continue Aromasin for now.  # Chronic hypokalemia/diarrhea-potassium 4.0; on Kdur- STABLE.   #Swelling under the left underarm/chest wall-likely lymphedema- awaiting on physical therapy.  # Chronic intermittent nausea- 1-2 a month- improved on Zofran.STABLE  # Elevated LFTs- alkaline phosphatase-134; STABLE.   # BMD- T score= 0.9; continue Vit D [ca-constipation]; STABLE.    # Fatigue-dec TSH- 0.9; Decreased the dose to 6mg/day. Defer to PCP.   # Insomnia/likely secondary to anxiety- on valium improved.    # DISPOSITION:  #follow up in 2 months-MD; labs- cbc/cmp-Dr.B

## 2020-12-22 ENCOUNTER — Other Ambulatory Visit: Payer: Self-pay | Admitting: Internal Medicine

## 2020-12-22 ENCOUNTER — Other Ambulatory Visit: Payer: Self-pay | Admitting: Family Medicine

## 2020-12-28 ENCOUNTER — Telehealth: Payer: Self-pay | Admitting: *Deleted

## 2020-12-28 NOTE — Telephone Encounter (Signed)
Hold anastrozole. I'll see her in 2 weeks for re-evaluation. If symptoms worsen or don't improve in the interim, let me know and I'll see her sooner.

## 2020-12-28 NOTE — Telephone Encounter (Signed)
[name] please advise - [facility]? Visit.

## 2020-12-28 NOTE — Telephone Encounter (Signed)
Spoke with the patient. She gave verbal understanding to hold the anastrozole x 2 weeks. We will reevaluate in 2 weeks in [facility]. mychart visit will be setup for 6/2 at 1:30 pm. Patient sent link to set up mychart. RN offered to help with set up mychart during telephone call, but patient declined. My chart set up Password link text sent to patient's phone per request. She will get her husband to set up mychart for her.

## 2020-12-28 NOTE — Telephone Encounter (Signed)
Patient called reporting that the newer AI (Anastrozole) she was started on a little over a month ago is worse than the Arimidex. She has had gradual onset of fatigue, increased hot flashes, joint pain, depressed, Feels "fuzzy" like a black cloud hanging over her head. She states that the symptoms seem to have gotten worse this past week. Please advise

## 2021-01-14 ENCOUNTER — Other Ambulatory Visit: Payer: Self-pay | Admitting: *Deleted

## 2021-01-14 ENCOUNTER — Inpatient Hospital Stay: Payer: BC Managed Care – PPO | Attending: Nurse Practitioner | Admitting: Nurse Practitioner

## 2021-01-14 DIAGNOSIS — C50412 Malignant neoplasm of upper-outer quadrant of left female breast: Secondary | ICD-10-CM | POA: Diagnosis not present

## 2021-01-14 DIAGNOSIS — Z170 Estrogen receptor positive status [ER+]: Secondary | ICD-10-CM | POA: Diagnosis not present

## 2021-01-14 DIAGNOSIS — R5383 Other fatigue: Secondary | ICD-10-CM | POA: Diagnosis not present

## 2021-01-14 DIAGNOSIS — Z79899 Other long term (current) drug therapy: Secondary | ICD-10-CM | POA: Insufficient documentation

## 2021-01-14 NOTE — Progress Notes (Signed)
Virtual Visit Progress Note  [facility]  [phone] [phone]  I connected with Jessica Barnes on 01/14/21 at  1:30 PM EDT by video enabled telemedicine visit and verified that I am speaking with the correct person using two identifiers.   I discussed the limitations, risks, security and privacy concerns of performing an evaluation and management service by telemedicine and the availability of in-person appointments. I also discussed with the patient that there may be a patient responsible charge related to this service. The patient expressed understanding and agreed to proceed.   Other persons participating in the visit and their role in the encounter: none  Patient's location: home Provider's location: clinic  Chief Complaint: fatigue    Patient Care Team: Tonia Ghent, MD as PCP - General (Family Medicine) Cammie Sickle, MD as Consulting Physician (Internal Medicine) Rolm Bookbinder, MD as Consulting Physician (General Surgery) Rico Junker, [name]   Name of the patient: Jessica Barnes  [id number]  [birth year]   Date of visit: 01/14/21  Diagnosis-breast cancer  Heme/Onc history:  Oncology History Overview Note  # DEC 2020- LEFT BREAST-TWO PRIM ARIES:   #A]-IMC; ER/PR >90%; Her 2-IHC-2+; FISH-NEG-2 ; MRI- 3.7 CM; s/p s/p lumpectomy; s/p reexcision-final margins negative.  Stage Ib.    #B] Bx- IMC; G-1; ER/PR-pos; her2 NEG. s/p lumpectomy; margins negative [as per Dr.Wakefield; discussed at [facility] tumor conference]  # MARCH 31st 2021- START Taxotere [60 mg/m]; Cytoxan; growth factor x4 cycles; s/p radiation 04/29/2020  # MID-OCT 2021- Start anastrozole; MID MARCH 2022- stopped sec to intolerance; MID April 20222- START AROMASIN  #Extreme anxiety/chronic diarrhea/chronic severe hypokalemia/history of migraines/depression  Used estrogen and  progesterone therapy: on premarin for post- menopausal x 2-3 years- discontinued in JAN 2021.   # SURVIVORSHIP:   DIAGNOSIS: Breast cancer  STAGE: IB & I     ;  GOALS: Cure  CURRENT/MOST RECENT THERAPY : Anastrozole    Carcinoma of upper-outer quadrant of left breast in female, estrogen receptor positive ([place])  08/14/2019 Initial Diagnosis   Carcinoma of upper-outer quadrant of left breast in female, estrogen receptor positive ([place])   09/10/2019 Genetic Testing   Negative genetic testing on the STAT and common hereditary cancer panel.  The Common Hereditary Gene Panel offered by [facility] includes sequencing and/or deletion duplication testing of the following 48 genes: APC, ATM, AXIN2, BARD1, BMPR1A, BRCA1, BRCA2, BRIP1, CDH1, CDK4, CDKN2A (p14ARF), CDKN2A (p16INK4a), CHEK2, CTNNA1, DICER1, EPCAM (Deletion/duplication testing only), GREM1 (promoter region deletion/duplication testing only), KIT, MEN1, MLH1, MSH2, MSH3, MSH6, MUTYH, NBN, NF1, NHTL1, PALB2, PDGFRA, PMS2, POLD1, POLE, PTEN, RAD50, RAD51C, RAD51D, RNF43, SDHB, SDHC, SDHD, SMAD4, SMARCA4. STK11, TP53, TSC1, TSC2, and VHL.  The following genes were evaluated for sequence changes only: SDHA and HOXB13 c.251G>A variant only. The report date is September 10, 2019.   10/03/2019 Cancer Staging   Staging form: Breast, AJCC 8th Edition - Pathologic stage from 10/03/2019: Stage IB (pT2, pN1a, cM0, G2, ER+, PR+, HER2-) - Signed by Gardenia Phlegm, NP on 10/16/2019   10/09/2019 - 10/09/2019 Chemotherapy   The patient had dexamethasone (DECADRON) 4 MG tablet, 0 of 1 cycle, Start date: --, End date: -- DOXOrubicin (ADRIAMYCIN) chemo injection 98 mg, 60 mg/m2, Intravenous,  Once, 0 of 4 cycles PALONOSETRON HCL INJECTION 0.25 MG/5ML, 0.25 mg, Intravenous,  Once, 0 of 4 cycles pegfilgrastim-jmdb (FULPHILA) injection 6 mg, 6 mg, Subcutaneous,  Once, 0 of 4 cycles cyclophosphamide (CYTOXAN)  980 mg in sodium chloride 0.9 % 250 mL chemo infusion,  600 mg/m2, Intravenous,  Once, 0 of 4 cycles PACLitaxel (TAXOL) 132 mg in sodium chloride 0.9 % 250 mL chemo infusion (</= 37m/m2), 80 mg/m2, Intravenous,  Once, 0 of 12 cycles FOSAPREPITANT IV INFUSION 150 MG, 150 mg, Intravenous,  Once, 0 of 4 cycles  for chemotherapy treatment.    11/13/2019 -  Chemotherapy   The patient had dexamethasone (DECADRON) 4 MG tablet, 8 mg, Oral, 2 times daily, 1 of 1 cycle, Start date: --, End date: -- palonosetron (ALOXI) injection 0.25 mg, 0.25 mg, Intravenous,  Once, 4 of 4 cycles Administration: 0.25 mg (11/13/2019), 0.25 mg (12/04/2019), 0.25 mg (12/25/2019), 0.25 mg (01/15/2020) pegfilgrastim-jmdb (FULPHILA) injection 6 mg, 6 mg, Subcutaneous,  Once, 4 of 4 cycles Administration: 6 mg (11/14/2019), 6 mg (12/05/2019), 6 mg (12/26/2019), 6 mg (01/16/2020) cyclophosphamide (CYTOXAN) 1,000 mg in sodium chloride 0.9 % 250 mL chemo infusion, 980 mg, Intravenous,  Once, 4 of 4 cycles Administration: 1,000 mg (11/13/2019), 1,000 mg (12/04/2019), 1,000 mg (12/25/2019), 1,000 mg (01/15/2020) DOCEtaxel (TAXOTERE) 100 mg in sodium chloride 0.9 % 250 mL chemo infusion, 60 mg/m2 = 100 mg (100 % of original dose 60 mg/m2), Intravenous,  Once, 4 of 4 cycles Dose modification: 60 mg/m2 (original dose 60 mg/m2, Cycle 1, Reason: Provider Judgment) Administration: 100 mg (11/13/2019), 100 mg (12/04/2019), 100 mg (12/25/2019), 100 mg (01/15/2020)  for chemotherapy treatment.      Interval history- Jessica Barnes presents with a report of fatigue.  Symptoms began several months ago. Sentinal symptom the patient feels fatigue began with: significant change in weight. Symptoms of her fatigue have been diffuse soft tissue aches and pains, feelings of depression, general malaise and intermittent intestinal cramping and loose stools. Patient describes the following psychologic symptoms: depression and stress of disease, treatment.  Patient denies exercise intolerance, unusual rashes, cold  intolerance, constipation and change in hair texture., GI blood loss, excessive menstrual bleeding and witnessed or suspected sleep apnea.  Symptoms have progressed to a point and plateaued.  Severity has been struggles to carry out day to day responsibilities..  She was seen for this problem previously and her aromatase inhibitor was held.  Previously started anastrozole.  Stopped due to intolerance.  Started Aromasin.  Held since mid May.  She says that she is tired when she wakes up in the morning and struggles through the day.  No dizziness or weakness.  No recent fevers or illness.  No easy bruising or bleeding.  No nausea, vomiting, constipation, or diarrhea.  No urinary complaints.  Review of systems- Review of Systems  Constitutional: Positive for malaise/fatigue. Negative for chills, fever and weight loss.  HENT: Negative for hearing loss, nosebleeds, sore throat and tinnitus.   Eyes: Negative for blurred vision and double vision.  Respiratory: Negative for cough, hemoptysis, shortness of breath and wheezing.   Cardiovascular: Negative for chest pain, palpitations and leg swelling.  Gastrointestinal: Negative for abdominal pain, blood in stool, constipation, diarrhea, melena, nausea and vomiting.  Genitourinary: Negative for dysuria and urgency.  Musculoskeletal: Negative for back pain, falls, joint pain and myalgias.  Skin: Negative for itching and rash.  Neurological: Negative for dizziness, tingling, sensory change, loss of consciousness, weakness and headaches.  Endo/Heme/Allergies: Negative for environmental allergies. Does not bruise/bleed easily.  Psychiatric/Behavioral: Negative for depression. The patient is not nervous/anxious and does not have insomnia.       Allergies  Allergen Reactions  . Sulfamethoxazole-Trimethoprim  Lip and tongue edema  . Codeine     GI upset    Past Medical History:  Diagnosis Date  . Anxiety 10/13/96  . Chest pain    atypical, hosp for  that and depression 8/10-8/11/08  . COPD (chronic obstructive pulmonary disease) ([place]) 09/15/05   by x-ray  . Depression 10/13/96   hospital 8/10-8/11/08.  notable history: son with TBI after MVA  . Family history of breast cancer   . Family history of colon cancer   . Family history of pancreatic cancer   . Hyperlipidemia 09/15/98  . Hypothyroidism 01/14/96  . Migraine    27 yoa  . NSVD (normal spontaneous vaginal delivery)    x 1  . Personal history of chemotherapy    finished 01/2020  . Personal history of radiation therapy    finished 04/2020  . PONV (postoperative nausea and vomiting)   . PTSD (post-traumatic stress disorder)    likely PTSD after son's MVA/TBI in 2007, s/p counseling    Past Surgical History:  Procedure Laterality Date  . APPENDECTOMY     60 years of age  . BREAST BIOPSY Left 08/06/2019   [place] bx/ ribbon clip/ invasive mammary carcinoma  . BREAST BIOPSY Left 09/06/2019   [place] bx, heart clip, invasive mammary carcinoma   . BREAST LUMPECTOMY Left 10/21/2019   Inv ductal Ca and DCIS   . BREAST LUMPECTOMY WITH RADIOACTIVE SEED AND SENTINEL LYMPH NODE BIOPSY Left 10/01/2019   Procedure: LEFT BREAST LUMPECTOMY WITH BRACKETED RADIOACTIVE SEEDS AND LEFT AXILLARY SENTINEL LYMPH NODE BIOPSY;  Surgeon: Rolm Bookbinder, MD;  Location: [place];  Service: General;  Laterality: Left;  . PORTACATH PLACEMENT Right 10/28/2019   Procedure: INSERTION PORT-A-CATH WITH ULTRASOUND GUIDANCE;  Surgeon: Rolm Bookbinder, MD;  Location: [place];  Service: General;  Laterality: Right;  . RE-EXCISION OF BREAST LUMPECTOMY Left 10/28/2019   Procedure: RE-EXCISION OF LEFT BREAST LUMPECTOMY;  Surgeon: Rolm Bookbinder, MD;  Location: [place];  Service: General;  Laterality: Left;    Social History   Socioeconomic History  . Marital status: Married    Spouse name: Not on file  . Number of children: Not on file  . Years of education: Not  on file  . Highest education level: Not on file  Occupational History  . Not on file  Tobacco Use  . Smoking status: Former Smoker    Packs/day: 0.50    Years: 8.00    Pack years: 4.00    Types: Cigarettes  . Smokeless tobacco: Never Used  . Tobacco comment: quit 04/15/2014  Vaping Use  . Vaping Use: Every day  Substance and Sexual Activity  . Alcohol use: No    Alcohol/week: 0.0 standard drinks  . Drug use: No  . Sexual activity: Not on file  Other Topics Concern  . Not on file  Social History Narrative   Remarried, lives with husband 1 son, he had TBI and short term memory loss after prolonged illness      #Patient lives in [place] with her husband; prior history of smoking/currently vapes.  No significant alcohol use.  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    Social Determinants of Health   Financial Resource Strain: Not on file  Food Insecurity: Not on file  Transportation Needs: Not on file  Physical Activity: Not on file  Stress: Not on file  Social Connections: Not on file  Intimate Partner Violence: Not on file    Family  History  Problem Relation Age of Onset  . Hypertension Mother   . Colon cancer Father        d. 35  . Thyroid disease Sister   . Breast cancer Paternal Aunt   . Breast cancer Paternal Grandmother   . Colon cancer Paternal Grandmother   . Breast cancer Cousin        paternal aunt's daughter  . Colon cancer Paternal Uncle        3 pat uncles with colon cancer at unknown ages  . Pancreatic cancer Maternal Uncle        d. 10  . Breast cancer Paternal Aunt      Current Outpatient Medications:  .  butalbital-acetaminophen-caffeine (FIORICET) 50-325-40 MG tablet, TAKE 1 TABLET BY MOUTH EVERY 4 TO 6 HOURS AS NEEDED FOR HEADACHE, Disp: 30 tablet, Rfl: 1 .  Calcium Carbonate-Vit D-Min (CALCIUM 1200 PO), Take 1 capsule by mouth daily., Disp: , Rfl:  .  diazepam (VALIUM) 10 MG tablet, TAKE 1 TABLET BY MOUTH EVERY NIGHT AT BEDTIME AS NEEDED  FOR ANXIETY., Disp: 30 tablet, Rfl: 2 .  diphenoxylate-atropine (LOMOTIL) 2.5-0.025 MG tablet, TAKE 1 TABLET BY MOUTH 4 TIMES A DAY AS NEEDED FOR DIARHEA OR LOOSE STOOLS. TAKE WITH IMMODIUM, Disp: 30 tablet, Rfl: 0 .  FLUoxetine (PROZAC) 20 MG capsule, TAKE 3 CAPSULES BY MOUTH DAILY, Disp: 270 capsule, Rfl: 1 .  ibuprofen (ADVIL) 100 MG tablet, Take 100 mg by mouth every 6 (six) hours as needed for fever. , Disp: , Rfl:  .  levothyroxine (SYNTHROID) 75 MCG tablet, Take 1 tablet each morning on empty stomach except for 0.5 tablet on Sundays., Disp: 100 tablet, Rfl: 1 .  loperamide (IMODIUM A-D) 2 MG tablet, Take 4 mg by mouth 4 (four) times daily as needed for diarrhea or loose stools. , Disp: , Rfl:  .  ondansetron (ZOFRAN) 8 MG tablet, One pill every 8 hours as needed for nausea/vomitting., Disp: 40 tablet, Rfl: 1 .  potassium chloride SA (KLOR-CON) 20 MEQ tablet, TAKE 1 TABLET BY MOUTH TWICE A DAY, Disp: 60 tablet, Rfl: 0 .  exemestane (AROMASIN) 25 MG tablet, Take 1 tablet (25 mg total) by mouth daily after breakfast. (Patient not taking: Reported on 01/14/2021), Disp: 30 tablet, Rfl: 6  Physical exam: Exam limited due to telemedicine  There were no vitals filed for this visit. Physical Exam Constitutional:      General: She is not in acute distress. HENT:     Head: Normocephalic.  Pulmonary:     Effort: No respiratory distress.  Neurological:     Mental Status: She is alert and oriented to person, place, and time.  Psychiatric:        Mood and Affect: Mood normal.        Behavior: Behavior normal.      CMP Latest Ref Rng & Units 10/26/2020  Glucose 70 - 99 mg/dL 129(H)  BUN 6 - 20 mg/dL 16  Creatinine 0.44 - 1.00 mg/dL 1.22(H)  Sodium 135 - 145 mmol/L 136  Potassium 3.5 - 5.1 mmol/L 4.0  Chloride 98 - 111 mmol/L 102  CO2 22 - 32 mmol/L 22  Calcium 8.9 - 10.3 mg/dL 9.2  Total Protein 6.5 - 8.1 g/dL 7.6  Total Bilirubin 0.3 - 1.2 mg/dL 0.2(L)  Alkaline Phos 38 - 126 U/L 134(H)   AST 15 - 41 U/L 32  ALT 0 - 44 U/L 28   CBC Latest Ref Rng & Units 10/26/2020  WBC  4.0 - 10.5 K/uL 9.9  Hemoglobin 12.0 - 15.0 g/dL 13.0  Hematocrit 36.0 - 46.0 % 40.1  Platelets 150 - 400 K/uL 386    No images are attached to the encounter.  No results found.  Assessment and plan- Patient is a 60 y.o. female diagnosed with left breast cancer, 2 separate primaries, status post 4 cycles of adjuvant chemotherapy and radiation completed April 29, 2021. Currently on aromasin, held due to fatigue who presents to clinic for evaluation of acute fatigue.   Fatigue- etiology unclear. Onset corresponds with aromasin. Worse than with anastrozole. Not significantly improved with holding medication. Will check CBC, CMP, TSH, and CK to evaluate for other etiologies.   Plan:  RTC this week to check labs Continue to hold aromasin RTC on 01/29/21 for virtual visit to review results and re-evaluate symptoms  Visit Diagnosis 1. Fatigue, unspecified type   2. Carcinoma of upper-outer quadrant of left breast in female, estrogen receptor positive ([place])     Patient expressed understanding and was in agreement with this plan. She also understands that She can call clinic at any time with any questions, concerns, or complaints.   I discussed the assessment and treatment plan with the patient. The patient was provided an opportunity to ask questions and all were answered. The patient agreed with the plan and demonstrated an understanding of the instructions.   The patient was advised to call back or seek an in-person evaluation if the symptoms worsen or if the condition fails to improve as anticipated.   I spent 20 minutes face-to-face video visit time dedicated to the care of this patient on the date of this encounter to include pre-visit review of interval med onc notes, face-to-face time with the patient, and post visit ordering of testing/documentation.   Thank you for allowing me to participate in  the care of this very pleasant patient.   Beckey Rutter, DNP, AGNP-C [facility]

## 2021-01-14 NOTE — Progress Notes (Signed)
Pt stopped AI( exemestane) 2 weeks. She is some better. She feels tired all the time. Appetite is good. Bowels are her normal which is good BM, to diarrhea to constipation. All her life she has done that

## 2021-01-15 IMAGING — MG MM PLC BREAST LOC DEV 1ST LESION INC MAMMO GUIDE*L*
8 of 9 series · 8 of 9 positions shown · non-contrast
Comparison: Previous exam(s).

CLINICAL DATA: Biopsy proven invasive mammary carcinoma in the left
breast at 2 o'clock 3 cm from the nipple (ribbon shaped clip) and
biopsy proven invasive mammary carcinoma in the left breast at 2
o'clock subareolar region (heart shaped clip).

EXAM:
MAMMOGRAPHIC GUIDED RADIOACTIVE SEED LOCALIZATIONS OF THE LEFT
BREAST

[L ML]
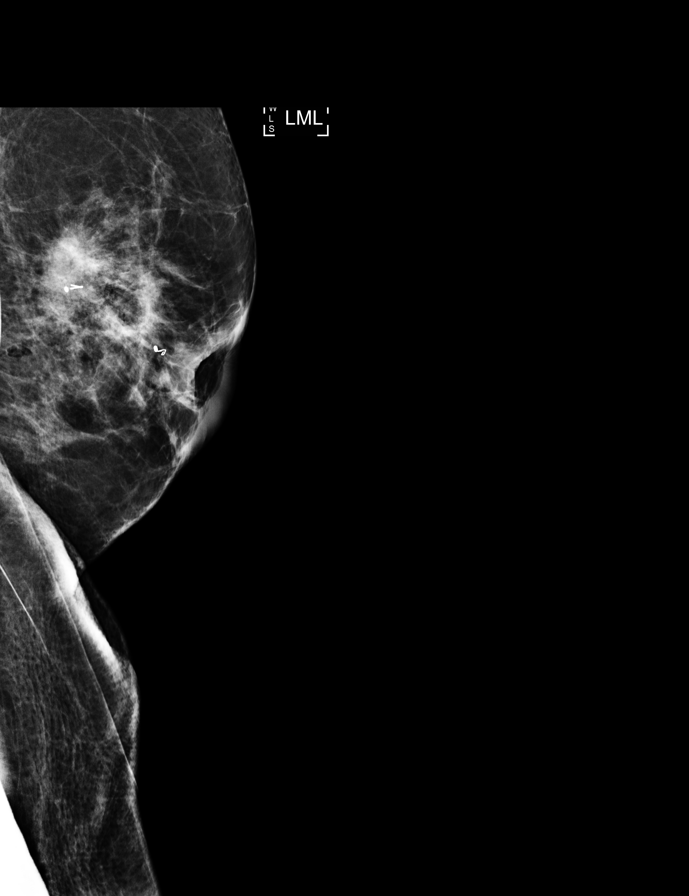

[L CC (1 of 4)]
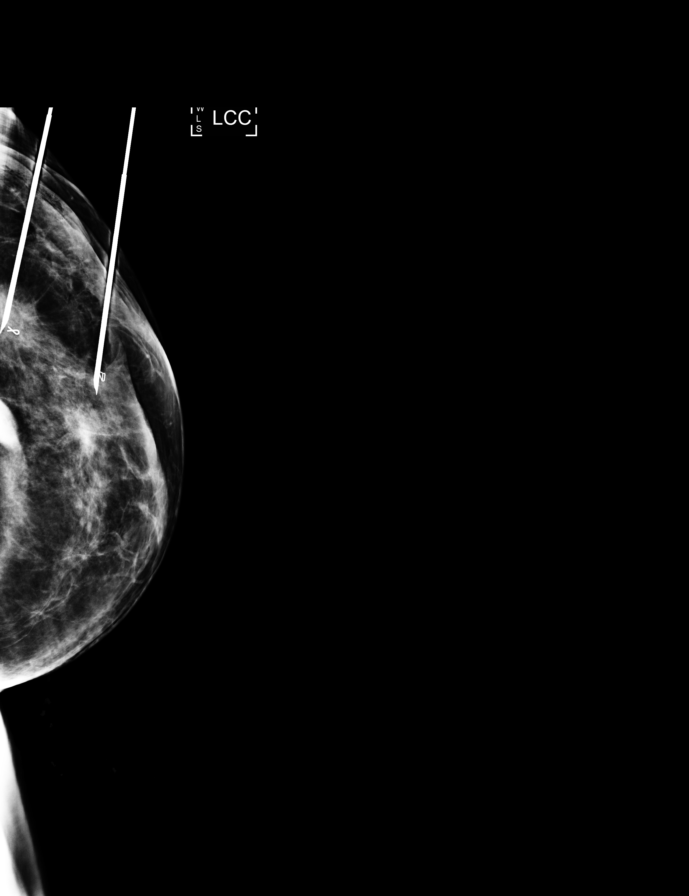

[L LM (1 of 3)]
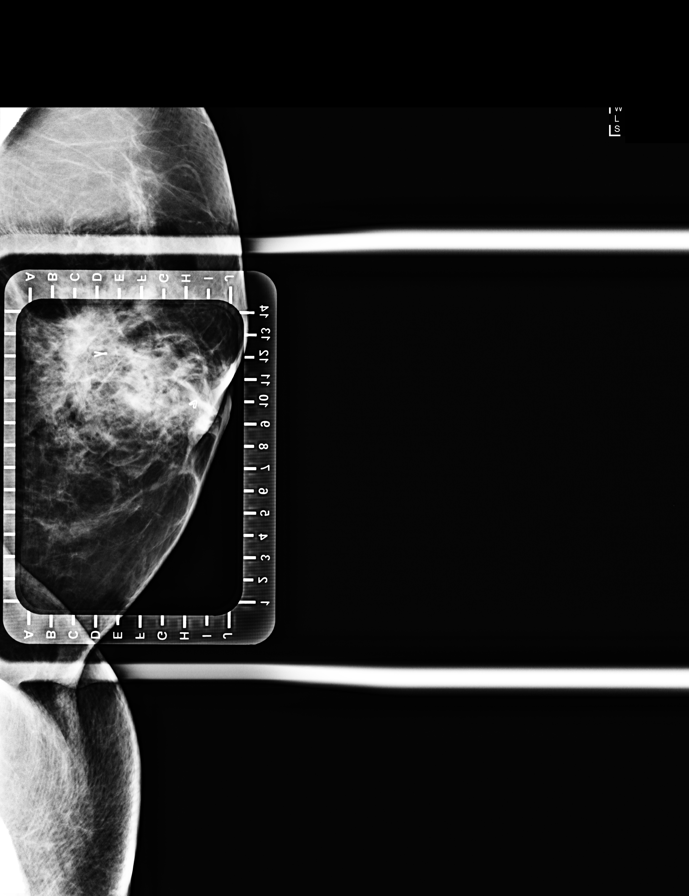

[L LM (2 of 3)]
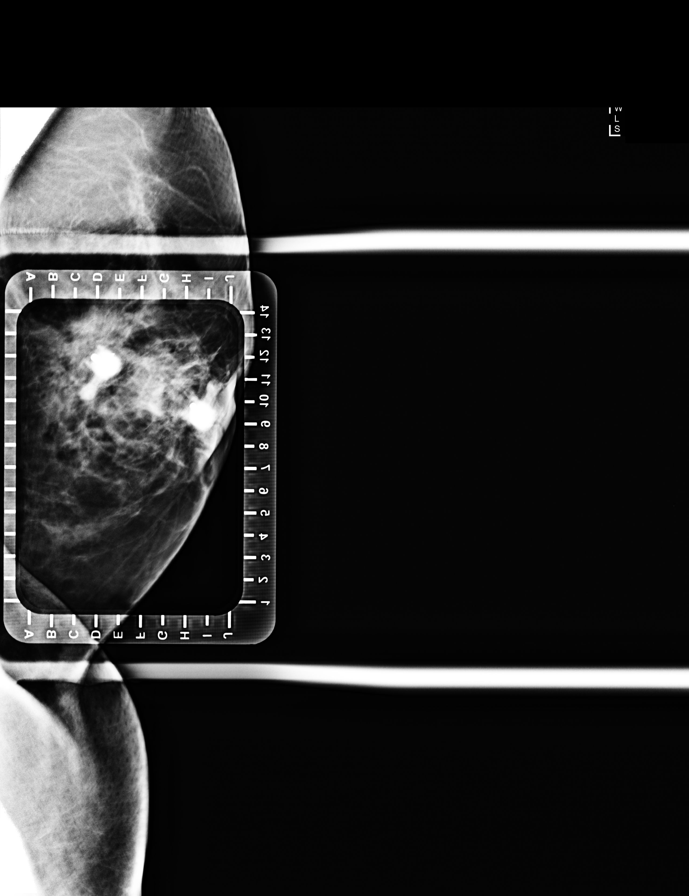

[L LM (3 of 3)]
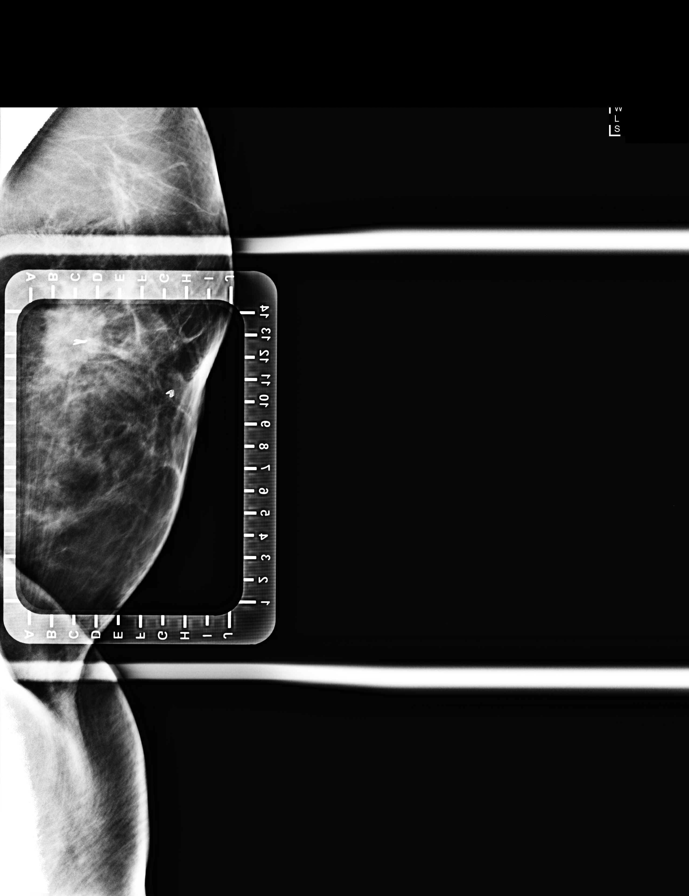

[L CC (2 of 4)]
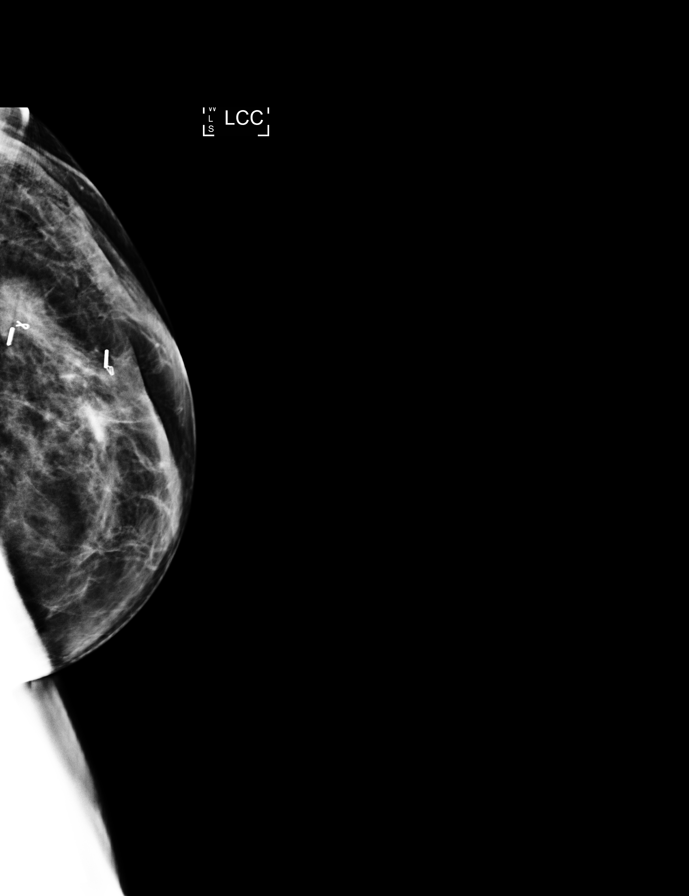

[L CC (3 of 4)]
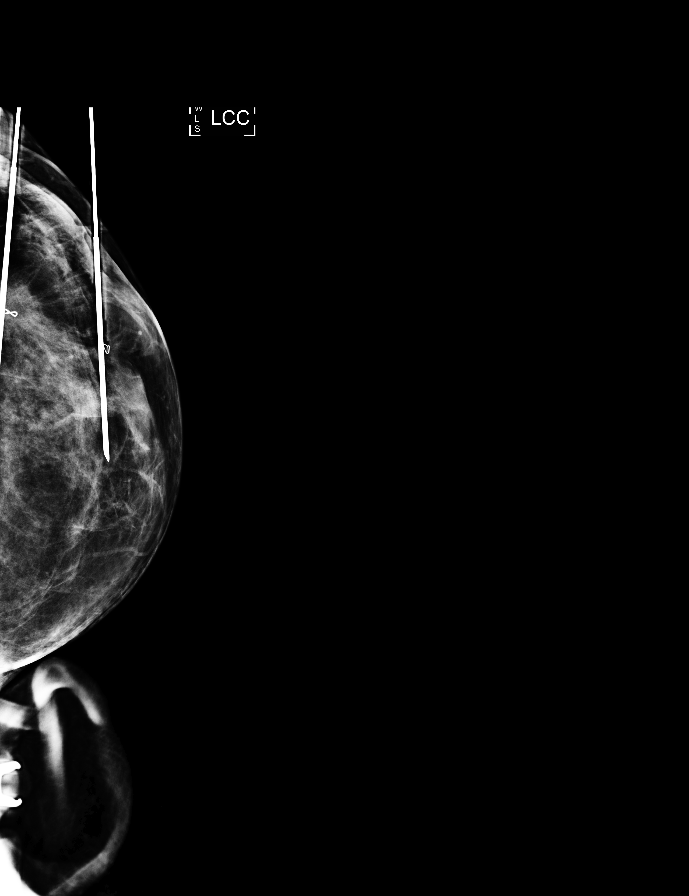

[L CC (4 of 4)]
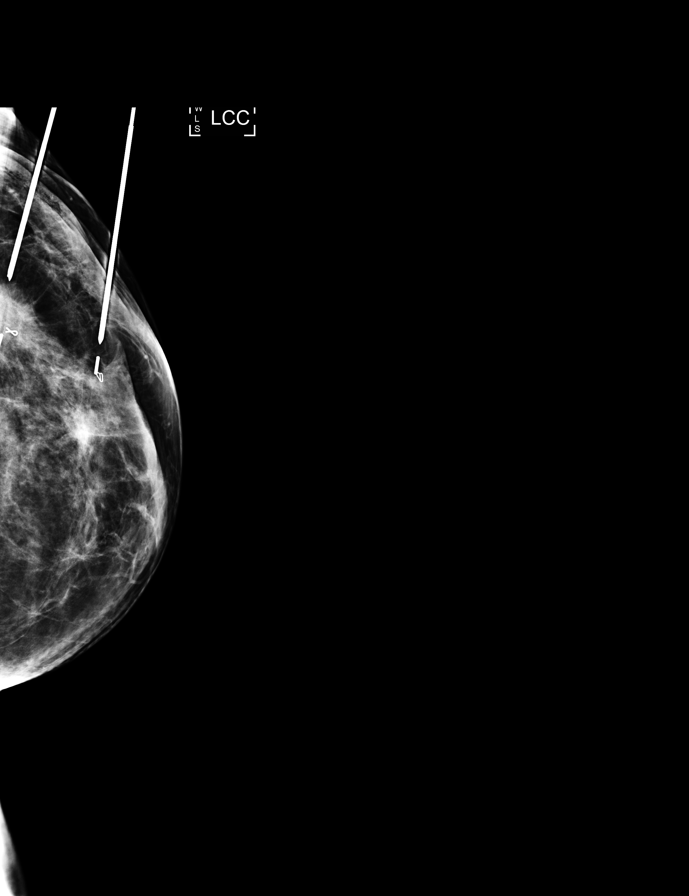

[8 of 9 positions shown; findings below may reference images not displayed]



The usual time-out protocol was performed immediately prior to the
procedure.

Using mammographic guidance, sterile technique, 1% lidocaine and an
E-[id number] radioactive seed, ribbon shaped was localized using a lateral
to medial approach. The follow-up mammogram images confirm the seed
in the expected location and were marked for Dr. Adonai.

Follow-up survey of the patient confirms presence of the radioactive
seed.

Order number of [id number] seed:  [id number].

Total activity:  0.251 millicuries reference Date: 09/26/2019

The patient tolerated the procedure well and was released from the
[REDACTED]. She was given instructions regarding seed removal.

Patient presents for radioactive seed localization prior to surgery.
I met with the patient and we discussed the procedure of seed
localization including benefits and alternatives. We discussed the
high likelihood of a successful procedure. We discussed the risks of
the procedure including infection, bleeding, tissue injury and
further surgery. We discussed the low dose of radioactivity involved
in the procedure. Informed, written consent was given.

The usual time-out protocol was performed immediately prior to the
procedure.

Using mammographic guidance, sterile technique, 1% lidocaine and an
E-4QO radioactive seed, heart shaped was localized using a lateral
to medial approach. The follow-up mammogram images confirm the seed
in the expected location and were marked for Dr. Adonai.

Follow-up survey of the patient confirms presence of the radioactive
seed.

Order number of E-[id number] seed:  [id number].

Total activity:  0.251 millicuries reference Date: 09/26/2019

The patient tolerated the procedure well and was released from the
[REDACTED]. She was given instructions regarding seed removal.
IMPRESSION: Radioactive seed localizations of the left breast. No apparent
complications.

## 2021-01-18 ENCOUNTER — Other Ambulatory Visit: Payer: Self-pay | Admitting: Nurse Practitioner

## 2021-01-18 ENCOUNTER — Inpatient Hospital Stay: Payer: BC Managed Care – PPO

## 2021-01-18 ENCOUNTER — Encounter: Payer: Self-pay | Admitting: Internal Medicine

## 2021-01-18 DIAGNOSIS — C50412 Malignant neoplasm of upper-outer quadrant of left female breast: Secondary | ICD-10-CM | POA: Diagnosis not present

## 2021-01-18 DIAGNOSIS — Z170 Estrogen receptor positive status [ER+]: Secondary | ICD-10-CM | POA: Diagnosis not present

## 2021-01-18 DIAGNOSIS — R5383 Other fatigue: Secondary | ICD-10-CM

## 2021-01-18 DIAGNOSIS — Z79899 Other long term (current) drug therapy: Secondary | ICD-10-CM | POA: Diagnosis not present

## 2021-01-18 DIAGNOSIS — E039 Hypothyroidism, unspecified: Secondary | ICD-10-CM

## 2021-01-18 LAB — COMPREHENSIVE METABOLIC PANEL
ALT: 24 U/L (ref 0–44)
AST: 27 U/L (ref 15–41)
Albumin: 4 g/dL (ref 3.5–5.0)
Alkaline Phosphatase: 123 U/L (ref 38–126)
Anion gap: 12 (ref 5–15)
BUN: 17 mg/dL (ref 6–20)
CO2: 23 mmol/L (ref 22–32)
Calcium: 8.9 mg/dL (ref 8.9–10.3)
Chloride: 101 mmol/L (ref 98–111)
Creatinine, Ser: 0.72 mg/dL (ref 0.44–1.00)
GFR, Estimated: >60 mL/min (ref >60)
Glucose, Bld: 122 mg/dL — ABNORMAL HIGH (ref 70–99)
Potassium: 3.7 mmol/L (ref 3.5–5.1)
Sodium: 136 mmol/L (ref 135–145)
Total Bilirubin: 0.4 mg/dL (ref 0.3–1.2)
Total Protein: 7.7 g/dL (ref 6.5–8.1)

## 2021-01-18 LAB — CBC WITH DIFFERENTIAL/PLATELET
Abs Immature Granulocytes: 0.01 10*3/uL (ref 0.00–0.07)
Basophils Absolute: 0.1 10*3/uL (ref 0.0–0.1)
Basophils Relative: 1 %
Eosinophils Absolute: 0.2 10*3/uL (ref 0.0–0.5)
Eosinophils Relative: 2 %
HCT: 38.2 % (ref 36.0–46.0)
Hemoglobin: 12.6 g/dL (ref 12.0–15.0)
Immature Granulocytes: 0 %
Lymphocytes Relative: 17 %
Lymphs Abs: 1.2 10*3/uL (ref 0.7–4.0)
MCH: 32 pg (ref 26.0–34.0)
MCHC: 33 g/dL (ref 30.0–36.0)
MCV: 97 fL (ref 80.0–100.0)
Monocytes Absolute: 0.4 10*3/uL (ref 0.1–1.0)
Monocytes Relative: 7 %
Neutro Abs: 4.9 10*3/uL (ref 1.7–7.7)
Neutrophils Relative %: 73 %
Platelets: 321 10*3/uL (ref 150–400)
RBC: 3.94 MIL/uL (ref 3.87–5.11)
RDW: 13.8 % (ref 11.5–15.5)
WBC: 6.8 10*3/uL (ref 4.0–10.5)
nRBC: 0 % (ref 0.0–0.2)

## 2021-01-18 LAB — TSH: TSH: 11.284 u[IU]/mL — ABNORMAL HIGH (ref 0.350–4.500)

## 2021-01-18 LAB — CK: Total CK: 128 U/L (ref 38–234)

## 2021-01-18 MED ORDER — LEVOTHYROXINE SODIUM 88 MCG PO TABS: 88.0000 ug | ORAL_TABLET | Freq: Every day | ORAL | 0 refills | Status: DC

## 2021-01-25 ENCOUNTER — Other Ambulatory Visit: Payer: Self-pay | Admitting: Family Medicine

## 2021-01-26 NOTE — Telephone Encounter (Signed)
Refill request for Valium 10 mg tablets  LOV - 07/02/20 Next OV - not scheduled Last refill - 10/25/20 #30/2

## 2021-01-27 NOTE — Telephone Encounter (Signed)
Sent. Thanks.   

## 2021-01-29 ENCOUNTER — Encounter: Payer: Self-pay | Admitting: Nurse Practitioner

## 2021-01-29 ENCOUNTER — Inpatient Hospital Stay (HOSPITAL_BASED_OUTPATIENT_CLINIC_OR_DEPARTMENT_OTHER): Payer: BC Managed Care – PPO | Admitting: Nurse Practitioner

## 2021-01-29 DIAGNOSIS — E039 Hypothyroidism, unspecified: Secondary | ICD-10-CM | POA: Diagnosis not present

## 2021-01-29 DIAGNOSIS — M2550 Pain in unspecified joint: Secondary | ICD-10-CM

## 2021-01-29 DIAGNOSIS — T451X5A Adverse effect of antineoplastic and immunosuppressive drugs, initial encounter: Secondary | ICD-10-CM

## 2021-01-29 MED ORDER — ANASTROZOLE 1 MG PO TABS: 1.0000 mg | ORAL_TABLET | Freq: Every day | ORAL | 1 refills | Status: DC

## 2021-01-29 NOTE — Progress Notes (Signed)
Virtual Visit Progress Note  [facility]  [phone] [phone]  I connected with Jessica Barnes on 01/29/21 at 11:00 AM EDT by telephone visit and verified that I am speaking with the correct person using two identifiers.   I discussed the limitations, risks, security and privacy concerns of performing an evaluation and management service by telemedicine and the availability of in-person appointments. I also discussed with the patient that there may be a patient responsible charge related to this service. The patient expressed understanding and agreed to proceed.   Other persons participating in the visit and their role in the encounter: none  Patient's location: home Provider's location: clinic  Chief Complaint: fatigue    Patient Care Team: Tonia Ghent, MD as PCP - General (Family Medicine) Cammie Sickle, MD as Consulting Physician (Internal Medicine) Rolm Bookbinder, MD as Consulting Physician (General Surgery) Rico Junker, [name]   Name of the patient: Jessica Barnes  [id number]  1961/06/21   Date of visit: 01/29/21  Diagnosis-breast cancer  Heme/Onc history:  Oncology History Overview Note  # DEC 2020- LEFT BREAST-TWO PRIM ARIES:   #A]-IMC; ER/PR >90%; Her 2-IHC-2+; FISH-NEG-2 ; MRI- 3.7 CM; s/p s/p lumpectomy; s/p reexcision-final margins negative.  Stage Ib.    #B] Bx- IMC; G-1; ER/PR-pos; her2 NEG. s/p lumpectomy; margins negative [as per Dr.Wakefield; discussed at [facility] tumor conference]  # MARCH 31st 2021- START Taxotere [60 mg/m]; Cytoxan; growth factor x4 cycles; s/p radiation 04/29/2020  # MID-OCT 2021- Start anastrozole; MID MARCH 2022- stopped sec to intolerance; MID April 20222- START AROMASIN  #Extreme anxiety/chronic diarrhea/chronic severe hypokalemia/history of migraines/depression  Used estrogen and progesterone therapy: on  premarin for post- menopausal x 2-3 years- discontinued in JAN 2021.   # SURVIVORSHIP:   DIAGNOSIS: Breast cancer  STAGE: IB & I     ;  GOALS: Cure  CURRENT/MOST RECENT THERAPY : Anastrozole    Carcinoma of upper-outer quadrant of left breast in female, estrogen receptor positive ([place])  08/14/2019 Initial Diagnosis   Carcinoma of upper-outer quadrant of left breast in female, estrogen receptor positive ([place])   09/10/2019 Genetic Testing   Negative genetic testing on the STAT and common hereditary cancer panel.  The Common Hereditary Gene Panel offered by [facility] includes sequencing and/or deletion duplication testing of the following 48 genes: APC, ATM, AXIN2, BARD1, BMPR1A, BRCA1, BRCA2, BRIP1, CDH1, CDK4, CDKN2A (p14ARF), CDKN2A (p16INK4a), CHEK2, CTNNA1, DICER1, EPCAM (Deletion/duplication testing only), GREM1 (promoter region deletion/duplication testing only), KIT, MEN1, MLH1, MSH2, MSH3, MSH6, MUTYH, NBN, NF1, NHTL1, PALB2, PDGFRA, PMS2, POLD1, POLE, PTEN, RAD50, RAD51C, RAD51D, RNF43, SDHB, SDHC, SDHD, SMAD4, SMARCA4. STK11, TP53, TSC1, TSC2, and VHL.  The following genes were evaluated for sequence changes only: SDHA and HOXB13 c.251G>A variant only. The report date is September 10, 2019.   10/03/2019 Cancer Staging   Staging form: Breast, AJCC 8th Edition - Pathologic stage from 10/03/2019: Stage IB (pT2, pN1a, cM0, G2, ER+, PR+, HER2-) - Signed by Gardenia Phlegm, NP on 10/16/2019   10/09/2019 - 10/09/2019 Chemotherapy   The patient had dexamethasone (DECADRON) 4 MG tablet, 0 of 1 cycle, Start date: --, End date: -- DOXOrubicin (ADRIAMYCIN) chemo injection 98 mg, 60 mg/m2, Intravenous,  Once, 0 of 4 cycles PALONOSETRON HCL INJECTION 0.25 MG/5ML, 0.25 mg, Intravenous,  Once, 0 of 4 cycles pegfilgrastim-jmdb (FULPHILA) injection 6 mg, 6 mg, Subcutaneous,  Once, 0 of 4 cycles cyclophosphamide (CYTOXAN) 980 mg in  sodium chloride 0.9 % 250 mL chemo infusion, 600 mg/m2, Intravenous,   Once, 0 of 4 cycles PACLitaxel (TAXOL) 132 mg in sodium chloride 0.9 % 250 mL chemo infusion (</= 86m/m2), 80 mg/m2, Intravenous,  Once, 0 of 12 cycles FOSAPREPITANT IV INFUSION 150 MG, 150 mg, Intravenous,  Once, 0 of 4 cycles  for chemotherapy treatment.    11/13/2019 -  Chemotherapy   The patient had dexamethasone (DECADRON) 4 MG tablet, 8 mg, Oral, 2 times daily, 1 of 1 cycle, Start date: --, End date: -- palonosetron (ALOXI) injection 0.25 mg, 0.25 mg, Intravenous,  Once, 4 of 4 cycles Administration: 0.25 mg (11/13/2019), 0.25 mg (12/04/2019), 0.25 mg (12/25/2019), 0.25 mg (01/15/2020) pegfilgrastim-jmdb (FULPHILA) injection 6 mg, 6 mg, Subcutaneous,  Once, 4 of 4 cycles Administration: 6 mg (11/14/2019), 6 mg (12/05/2019), 6 mg (12/26/2019), 6 mg (01/16/2020) cyclophosphamide (CYTOXAN) 1,000 mg in sodium chloride 0.9 % 250 mL chemo infusion, 980 mg, Intravenous,  Once, 4 of 4 cycles Administration: 1,000 mg (11/13/2019), 1,000 mg (12/04/2019), 1,000 mg (12/25/2019), 1,000 mg (01/15/2020) DOCEtaxel (TAXOTERE) 100 mg in sodium chloride 0.9 % 250 mL chemo infusion, 60 mg/m2 = 100 mg (100 % of original dose 60 mg/m2), Intravenous,  Once, 4 of 4 cycles Dose modification: 60 mg/m2 (original dose 60 mg/m2, Cycle 1, Reason: Provider Judgment) Administration: 100 mg (11/13/2019), 100 mg (12/04/2019), 100 mg (12/25/2019), 100 mg (01/15/2020)  for chemotherapy treatment.      Interval history- Jessica Barnes presents for follow up for fatigue. She has started levothyroxine at increased dose. She's been holding aromasin. Feels that fatigue persists but is better. Is willing to retry anastrzole which she took prior to [employer] Has worse aches and pains with aromasin. No other specific complaints.   Review of systems- Review of Systems  Constitutional:  Positive for malaise/fatigue (improved). Negative for chills, fever and weight loss.  HENT:  Negative for hearing loss, nosebleeds, sore throat and tinnitus.    Eyes:  Negative for blurred vision and double vision.  Respiratory:  Negative for cough, hemoptysis, shortness of breath and wheezing.   Cardiovascular:  Negative for chest pain, palpitations and leg swelling.  Gastrointestinal:  Negative for abdominal pain, blood in stool, constipation, diarrhea, melena, nausea and vomiting.  Genitourinary:  Negative for dysuria and urgency.  Musculoskeletal:  Negative for back pain, falls, joint pain and myalgias.  Skin:  Negative for itching and rash.  Neurological:  Negative for dizziness, tingling, sensory change, loss of consciousness, weakness and headaches.  Endo/Heme/Allergies:  Negative for environmental allergies. Does not bruise/bleed easily.  Psychiatric/Behavioral:  Negative for depression. The patient is not nervous/anxious and does not have insomnia.      Allergies  Allergen Reactions   Sulfamethoxazole-Trimethoprim     Lip and tongue edema   Codeine     GI upset    Past Medical History:  Diagnosis Date   Anxiety 10/13/96   Chest pain    atypical, hosp for that and depression 8/10-8/11/08   [place]   by x-ray   Depression 10/13/96   hospital 8/10-8/11/08.  notable history: son with TBI after MVA   Family history of breast cancer    Family history of colon cancer    Family history of pancreatic cancer    Hyperlipidemia 09/15/98   Hypothyroidism 01/14/96   Migraine    89yoa   NSVD (normal spontaneous vaginal delivery)    x 1   Personal history of chemotherapy  finished 01/2020   Personal history of radiation therapy    finished 04/2020   PONV (postoperative nausea and vomiting)    PTSD (post-traumatic stress disorder)    likely PTSD after son's MVA/TBI in 2007, s/p counseling    Past Surgical History:  Procedure Laterality Date   APPENDECTOMY     60 years of age   BREAST BIOPSY Left 08/06/2019   [place] bx/ ribbon clip/ invasive mammary carcinoma   BREAST BIOPSY Left  09/06/2019   [place] bx, heart clip, invasive mammary carcinoma    BREAST LUMPECTOMY Left 10/21/2019   Inv ductal Ca and DCIS    BREAST LUMPECTOMY WITH RADIOACTIVE SEED AND SENTINEL LYMPH NODE BIOPSY Left 10/01/2019   Procedure: LEFT BREAST LUMPECTOMY WITH BRACKETED RADIOACTIVE SEEDS AND LEFT AXILLARY SENTINEL LYMPH NODE BIOPSY;  Surgeon: Rolm Bookbinder, MD;  Location: [place];  Service: General;  Laterality: Left;   PORTACATH PLACEMENT Right 10/28/2019   Procedure: INSERTION PORT-A-CATH WITH ULTRASOUND GUIDANCE;  Surgeon: Rolm Bookbinder, MD;  Location: [place];  Service: General;  Laterality: Right;   RE-EXCISION OF BREAST LUMPECTOMY Left 10/28/2019   Procedure: RE-EXCISION OF LEFT BREAST LUMPECTOMY;  Surgeon: Rolm Bookbinder, MD;  Location: [place];  Service: General;  Laterality: Left;    Social History   Socioeconomic History   Marital status: Married    Spouse name: Not on file   Number of children: Not on file   Years of education: Not on file   Highest education level: Not on file  Occupational History   Not on file  Tobacco Use   Smoking status: Former    Packs/day: 0.50    Years: 8.00    Pack years: 4.00    Types: Cigarettes   Smokeless tobacco: Never   Tobacco comments:    quit 04/15/2014  Vaping Use   Vaping Use: Every day  Substance and Sexual Activity   Alcohol use: No    Alcohol/week: 0.0 standard drinks   Drug use: No   Sexual activity: Not on file  Other Topics Concern   Not on file  Social History Narrative   Remarried, lives with husband 1 son, he had TBI and short term memory loss after prolonged illness      #Patient lives in [place] with her husband; prior history of smoking/currently vapes.  No significant alcohol use.  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    Social Determinants of Health   Financial Resource Strain: Not on file  Food Insecurity: Not on file  Transportation Needs: Not  on file  Physical Activity: Not on file  Stress: Not on file  Social Connections: Not on file  Intimate Partner Violence: Not on file    Family History  Problem Relation Age of Onset   Hypertension Mother    Colon cancer Father        d. 43   Thyroid disease Sister    Breast cancer Paternal Aunt    Breast cancer Paternal Grandmother    Colon cancer Paternal Grandmother    Breast cancer Cousin        paternal aunt's daughter   Colon cancer Paternal Uncle        3 pat uncles with colon cancer at unknown ages   Pancreatic cancer Maternal Uncle        d. 82   Breast cancer Paternal Aunt      Current Outpatient Medications:    butalbital-acetaminophen-caffeine (FIORICET) 50-325-40 MG tablet, TAKE 1  TABLET BY MOUTH EVERY 4 TO 6 HOURS AS NEEDED FOR HEADACHE, Disp: 30 tablet, Rfl: 1   Calcium Carbonate-Vit D-Min (CALCIUM 1200 PO), Take 1 capsule by mouth daily., Disp: , Rfl:    diazepam (VALIUM) 10 MG tablet, TAKE 1 TABLET BY MOUTH EVERY NIGHT AT BEDTIME AS NEEDED FOR ANXIETY., Disp: 30 tablet, Rfl: 2   diphenoxylate-atropine (LOMOTIL) 2.5-0.025 MG tablet, TAKE 1 TABLET BY MOUTH 4 TIMES A DAY AS NEEDED FOR DIARHEA OR LOOSE STOOLS. TAKE WITH IMMODIUM, Disp: 30 tablet, Rfl: 0   FLUoxetine (PROZAC) 20 MG capsule, TAKE 3 CAPSULES BY MOUTH DAILY, Disp: 270 capsule, Rfl: 1   ibuprofen (ADVIL) 100 MG tablet, Take 100 mg by mouth every 6 (six) hours as needed for fever. , Disp: , Rfl:    levothyroxine (SYNTHROID) 88 MCG tablet, Take 1 tablet (88 mcg total) by mouth daily., Disp: 60 tablet, Rfl: 0   loperamide (IMODIUM A-D) 2 MG tablet, Take 4 mg by mouth 4 (four) times daily as needed for diarrhea or loose stools. , Disp: , Rfl:    ondansetron (ZOFRAN) 8 MG tablet, One pill every 8 hours as needed for nausea/vomitting., Disp: 40 tablet, Rfl: 1   potassium chloride SA (KLOR-CON) 20 MEQ tablet, TAKE 1 TABLET BY MOUTH TWICE A DAY, Disp: 60 tablet, Rfl: 0   exemestane (AROMASIN) 25 MG tablet, Take 1  tablet (25 mg total) by mouth daily after breakfast. (Patient not taking: No sig reported), Disp: 30 tablet, Rfl: 6  Physical exam: Exam limited due to telemedicine  There were no vitals filed for this visit. Physical Exam Pulmonary:     Effort: No respiratory distress.  Neurological:     Mental Status: She is alert and oriented to person, place, and time.     CMP Latest Ref Rng & Units 01/18/2021  Glucose 70 - 99 mg/dL 122(H)  BUN 6 - 20 mg/dL 17  Creatinine 0.44 - 1.00 mg/dL 0.72  Sodium 135 - 145 mmol/L 136  Potassium 3.5 - 5.1 mmol/L 3.7  Chloride 98 - 111 mmol/L 101  CO2 22 - 32 mmol/L 23  Calcium 8.9 - 10.3 mg/dL 8.9  Total Protein 6.5 - 8.1 g/dL 7.7  Total Bilirubin 0.3 - 1.2 mg/dL 0.4  Alkaline Phos 38 - 126 U/L 123  AST 15 - 41 U/L 27  ALT 0 - 44 U/L 24   CBC Latest Ref Rng & Units 01/18/2021  WBC 4.0 - 10.5 K/uL 6.8  Hemoglobin 12.0 - 15.0 g/dL 12.6  Hematocrit 36.0 - 46.0 % 38.2  Platelets 150 - 400 K/uL 321    No images are attached to the encounter.  No results found.  Assessment and plan- Patient is a 60 y.o. female diagnosed with left breast cancer, 2 separate primaries, status post 4 cycles of adjuvant chemotherapy and radiation completed April 29, 2021. Fatigued. Found to have TSH of 11. Aromasin held due to arthralgias and possibly contributing to fatigue. Restarted levothyroxine at increased dose of 88 mcg daily. Restart anastrozole which she previously tolerated better than aromasin.   Plan to recheck tsh in 6 weeks (July 18th) and follow up virtually after to recheck fatigue and discuss results.   Plan:  Recheck TSH on July 18th Virtual day or 2 later to discuss fatigue and results  Visit Diagnosis 1. Hypothyroidism, unspecified type   2. Aromatase inhibitor-associated arthralgia    Patient expressed understanding and was in agreement with this plan. She also understands that She can call clinic  at any time with any questions, concerns, or  complaints.   I discussed the assessment and treatment plan with the patient. The patient was provided an opportunity to ask questions and all were answered. The patient agreed with the plan and demonstrated an understanding of the instructions.   The patient was advised to call back or seek an in-person evaluation if the symptoms worsen or if the condition fails to improve as anticipated.   I discussed the assessment and treatment plan with the patient. The patient was provided an opportunity to ask questions and all were answered. The patient agreed with the plan and demonstrated an understanding of the instructions.   The patient was advised to call back or seek an in-person evaluation if the symptoms worsen or if the condition fails to improve as anticipated.   I spent 15 minutes none face-to-face visit time/telephone visit dedicated to the care of this patient on the date of this encounter.    Beckey Rutter, DNP, AGNP-C [facility]

## 2021-02-08 ENCOUNTER — Other Ambulatory Visit: Payer: Self-pay | Admitting: Family Medicine

## 2021-02-09 NOTE — Telephone Encounter (Signed)
Refill request for butalbital-acetaminophen-caffeine (FIORICET) 50-325-40 MG tablet  LOV - 07/02/20 Next OV - not scheduled Last refill - 12/08/20 #30/1

## 2021-02-10 NOTE — Telephone Encounter (Signed)
Sent. Thanks.   

## 2021-02-19 ENCOUNTER — Other Ambulatory Visit: Payer: Self-pay | Admitting: *Deleted

## 2021-02-19 DIAGNOSIS — C50412 Malignant neoplasm of upper-outer quadrant of left female breast: Secondary | ICD-10-CM

## 2021-02-19 DIAGNOSIS — E039 Hypothyroidism, unspecified: Secondary | ICD-10-CM

## 2021-02-19 DIAGNOSIS — Z170 Estrogen receptor positive status [ER+]: Secondary | ICD-10-CM

## 2021-02-22 ENCOUNTER — Inpatient Hospital Stay: Payer: BC Managed Care – PPO

## 2021-02-22 ENCOUNTER — Inpatient Hospital Stay: Payer: BC Managed Care – PPO | Admitting: Internal Medicine

## 2021-02-22 NOTE — Assessment & Plan Note (Deleted)
#  Left breast cancer-2 separate primaries- A] T2N1-ER/PR positive HER-2 negative;  B] T1N1 ER/PR positive HER-2 negative. S/p 4 cycles of adjuvant chemotherapy Taxotere [60 mg/m] Cytoxan. S/p  RT [last 9/15].  STABLE; No evidence of recurrence.  Discuss adj. Zometa.  # currently on Aromasin-tolerating with mild to moderate side effects.  Continue Aromasin for now.  # Chronic hypokalemia/diarrhea-potassium 4.0; on Kdur- STABLE.   #Swelling under the left underarm/chest wall-likely lymphedema- awaiting on physical therapy.  # Chronic intermittent nausea- 1-2 a month- improved on Zofran.STABLE  # Elevated LFTs- alkaline phosphatase-134; STABLE.   # BMD- T score= 0.9; continue Vit D [ca-constipation]; STABLE.    # Fatigue-dec TSH- 0.9; Decreased the dose to 6mg/day. Defer to PCP.   # Insomnia/likely secondary to anxiety- on valium improved.    # DISPOSITION:  #follow up in 2 months-MD; labs- cbc/cmp-Dr.B

## 2021-02-22 NOTE — Progress Notes (Deleted)
one [place] NOTE  Patient Care Team: Tonia Ghent, MD as PCP - General (Family Medicine) Cammie Sickle, MD as Consulting Physician (Internal Medicine) Rolm Bookbinder, MD as Consulting Physician (General Surgery) Rico Junker, [name]  #  Oncology History Overview Note  # DEC 2020- LEFT BREAST-TWO PRIM ARIES:   #A]-IMC; ER/PR >90%; Her 2-IHC-2+; FISH-NEG-2 ; MRI- 3.7 CM; s/p s/p lumpectomy; s/p reexcision-final margins negative.  Stage Ib.    #B] Bx- IMC; G-1; ER/PR-pos; her2 NEG. s/p lumpectomy; margins negative [as per Dr.Wakefield; discussed at [facility] tumor conference]  # MARCH 31st 2021- START Taxotere [60 mg/m]; Cytoxan; growth factor x4 cycles; s/p radiation 04/29/2020  # MID-OCT 2021- Start anastrozole; MID MARCH 2022- stopped sec to intolerance; MID April 20222- START AROMASIN  #Extreme anxiety/chronic diarrhea/chronic severe hypokalemia/history of migraines/depression  Used estrogen and progesterone therapy: on premarin for post- menopausal x 2-3 years- discontinued in JAN 2021.   # SURVIVORSHIP:   DIAGNOSIS: Breast cancer  STAGE: IB & I     ;  GOALS: Cure  CURRENT/MOST RECENT THERAPY : Anastrozole    Carcinoma of upper-outer quadrant of left breast in female, estrogen receptor positive ([place])  08/14/2019 Initial Diagnosis   Carcinoma of upper-outer quadrant of left breast in female, estrogen receptor positive ([place])   09/10/2019 Genetic Testing   Negative genetic testing on the STAT and common hereditary cancer panel.  The Common Hereditary Gene Panel offered by [facility] includes sequencing and/or deletion duplication testing of the following 48 genes: APC, ATM, AXIN2, BARD1, BMPR1A, BRCA1, BRCA2, BRIP1, CDH1, CDK4, CDKN2A (p14ARF), CDKN2A (p16INK4a), CHEK2, CTNNA1, DICER1, EPCAM (Deletion/duplication testing only), GREM1 (promoter region  deletion/duplication testing only), KIT, MEN1, MLH1, MSH2, MSH3, MSH6, MUTYH, NBN, NF1, NHTL1, PALB2, PDGFRA, PMS2, POLD1, POLE, PTEN, RAD50, RAD51C, RAD51D, RNF43, SDHB, SDHC, SDHD, SMAD4, SMARCA4. STK11, TP53, TSC1, TSC2, and VHL.  The following genes were evaluated for sequence changes only: SDHA and HOXB13 c.251G>A variant only. The report date is September 10, 2019.   10/03/2019 Cancer Staging   Staging form: Breast, AJCC 8th Edition - Pathologic stage from 10/03/2019: Stage IB (pT2, pN1a, cM0, G2, ER+, PR+, HER2-) - Signed by Gardenia Phlegm, NP on 10/16/2019   10/09/2019 - 10/09/2019 Chemotherapy   The patient had dexamethasone (DECADRON) 4 MG tablet, 0 of 1 cycle, Start date: --, End date: -- DOXOrubicin (ADRIAMYCIN) chemo injection 98 mg, 60 mg/m2, Intravenous,  Once, 0 of 4 cycles PALONOSETRON HCL INJECTION 0.25 MG/5ML, 0.25 mg, Intravenous,  Once, 0 of 4 cycles pegfilgrastim-jmdb (FULPHILA) injection 6 mg, 6 mg, Subcutaneous,  Once, 0 of 4 cycles cyclophosphamide (CYTOXAN) 980 mg in sodium chloride 0.9 % 250 mL chemo infusion, 600 mg/m2, Intravenous,  Once, 0 of 4 cycles PACLitaxel (TAXOL) 132 mg in sodium chloride 0.9 % 250 mL chemo infusion (</= 35m/m2), 80 mg/m2, Intravenous,  Once, 0 of 12 cycles FOSAPREPITANT IV INFUSION 150 MG, 150 mg, Intravenous,  Once, 0 of 4 cycles  for chemotherapy treatment.    11/13/2019 -  Chemotherapy   The patient had dexamethasone (DECADRON) 4 MG tablet, 8 mg, Oral, 2 times daily, 1 of 1 cycle, Start date: --, End date: -- palonosetron (ALOXI) injection 0.25 mg, 0.25 mg, Intravenous,  Once, 4 of 4 cycles Administration: 0.25 mg (11/13/2019), 0.25 mg (12/04/2019), 0.25 mg (12/25/2019), 0.25 mg (01/15/2020) pegfilgrastim-jmdb (FULPHILA) injection 6 mg, 6 mg, Subcutaneous,  Once, 4 of 4 cycles Administration: 6 mg (  11/14/2019), 6 mg (12/05/2019), 6 mg (12/26/2019), 6 mg (01/16/2020) cyclophosphamide (CYTOXAN) 1,000 mg in sodium chloride 0.9 % 250 mL chemo  infusion, 980 mg, Intravenous,  Once, 4 of 4 cycles Administration: 1,000 mg (11/13/2019), 1,000 mg (12/04/2019), 1,000 mg (12/25/2019), 1,000 mg (01/15/2020) DOCEtaxel (TAXOTERE) 100 mg in sodium chloride 0.9 % 250 mL chemo infusion, 60 mg/m2 = 100 mg (100 % of original dose 60 mg/m2), Intravenous,  Once, 4 of 4 cycles Dose modification: 60 mg/m2 (original dose 60 mg/m2, Cycle 1, Reason: Provider Judgment) Administration: 100 mg (11/13/2019), 100 mg (12/04/2019), 100 mg (12/25/2019), 100 mg (01/15/2020)  for chemotherapy treatment.       HISTORY OF PRESENTING ILLNESS:  Jessica Barnes 60 y.o.  female with of breast cancer  [T2N1-ER/PR positive HER-2 negative; T1N1-ER/PR positive HER-2 negative 2 separate primaries] s/p adjuvant chemotherapy; also s/p adjuvant radiation is here for follow-up.    Patient is currently  OFF adjuvant anastrozole foe the last 1 month because of intolerance.  Patient is hot flashes have improved since stopping anastrozole.  However continues to have fatigue.  Continues to have joint pains.  Review of Systems  Constitutional:  Positive for malaise/fatigue. Negative for chills, diaphoresis, fever and weight loss.  HENT:  Negative for nosebleeds and sore throat.   Eyes:  Negative for double vision.  Respiratory:  Negative for cough, hemoptysis, sputum production and wheezing.   Cardiovascular:  Negative for chest pain, palpitations, orthopnea and leg swelling.  Gastrointestinal:  Negative for abdominal pain, blood in stool, constipation, heartburn, melena, nausea and vomiting.  Genitourinary:  Negative for dysuria, frequency and urgency.  Musculoskeletal:  Negative for back pain and joint pain.  Neurological:  Negative for dizziness, tingling, focal weakness and weakness.  Endo/Heme/Allergies:  Does not bruise/bleed easily.  Psychiatric/Behavioral:  The patient is nervous/anxious and has insomnia.      MEDICAL HISTORY:  Past Medical History:  Diagnosis Date   . Anxiety 10/13/96  . Chest pain    atypical, hosp for that and depression 8/10-8/11/08  . COPD (chronic obstructive pulmonary disease) ([place]) 09/15/05   by x-ray  . Depression 10/13/96   hospital 8/10-8/11/08.  notable history: son with TBI after MVA  . Family history of breast cancer   . Family history of colon cancer   . Family history of pancreatic cancer   . Hyperlipidemia 09/15/98  . Hypothyroidism 01/14/96  . Migraine    27 yoa  . NSVD (normal spontaneous vaginal delivery)    x 1  . Personal history of chemotherapy    finished 01/2020  . Personal history of radiation therapy    finished 04/2020  . PONV (postoperative nausea and vomiting)   . PTSD (post-traumatic stress disorder)    likely PTSD after son's MVA/TBI in 2007, s/p counseling    SURGICAL HISTORY: Past Surgical History:  Procedure Laterality Date  . APPENDECTOMY     60 years of age  . BREAST BIOPSY Left 08/06/2019   [place] bx/ ribbon clip/ invasive mammary carcinoma  . BREAST BIOPSY Left 09/06/2019   [place] bx, heart clip, invasive mammary carcinoma   . BREAST LUMPECTOMY Left 10/21/2019   Inv ductal Ca and DCIS   . BREAST LUMPECTOMY WITH RADIOACTIVE SEED AND SENTINEL LYMPH NODE BIOPSY Left 10/01/2019   Procedure: LEFT BREAST LUMPECTOMY WITH BRACKETED RADIOACTIVE SEEDS AND LEFT AXILLARY SENTINEL LYMPH NODE BIOPSY;  Surgeon: Rolm Bookbinder, MD;  Location: [facility];  Service: General;  Laterality: Left;  . PORTACATH PLACEMENT Right 10/28/2019  Procedure: INSERTION PORT-A-CATH WITH ULTRASOUND GUIDANCE;  Surgeon: Rolm Bookbinder, MD;  Location: [facility];  Service: General;  Laterality: Right;  . RE-EXCISION OF BREAST LUMPECTOMY Left 10/28/2019   Procedure: RE-EXCISION OF LEFT BREAST LUMPECTOMY;  Surgeon: Rolm Bookbinder, MD;  Location: [facility];  Service: General;  Laterality: Left;    SOCIAL HISTORY: Social History   Socioeconomic History  . Marital status: Married     Spouse name: Not on file  . Number of children: Not on file  . Years of education: Not on file  . Highest education level: Not on file  Occupational History  . Not on file  Tobacco Use  . Smoking status: Former    Packs/day: 0.50    Years: 8.00    Pack years: 4.00    Types: Cigarettes  . Smokeless tobacco: Never  . Tobacco comments:    quit 04/15/2014  Vaping Use  . Vaping Use: Every day  Substance and Sexual Activity  . Alcohol use: No    Alcohol/week: 0.0 standard drinks  . Drug use: No  . Sexual activity: Not on file  Other Topics Concern  . Not on file  Social History Narrative   Remarried, lives with husband 1 son, he had TBI and short term memory loss after prolonged illness      #Patient lives in [place] with her husband; prior history of smoking/currently vapes.  No significant alcohol use.  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    Social Determinants of Health   Financial Resource Strain: Not on file  Food Insecurity: Not on file  Transportation Needs: Not on file  Physical Activity: Not on file  Stress: Not on file  Social Connections: Not on file  Intimate Partner Violence: Not on file    FAMILY HISTORY: Family History  Problem Relation Age of Onset  . Hypertension Mother   . Colon cancer Father        d. 1  . Thyroid disease Sister   . Breast cancer Paternal Aunt   . Breast cancer Paternal Grandmother   . Colon cancer Paternal Grandmother   . Breast cancer Cousin        paternal aunt's daughter  . Colon cancer Paternal Uncle        3 pat uncles with colon cancer at unknown ages  . Pancreatic cancer Maternal Uncle        d. 70  . Breast cancer Paternal Aunt     ALLERGIES:  is allergic to sulfamethoxazole-trimethoprim and codeine.  MEDICATIONS:  Current Outpatient Medications  Medication Sig Dispense Refill  . anastrozole (ARIMIDEX) 1 MG tablet Take 1 tablet (1 mg total) by mouth daily. 60 tablet 1  . butalbital-acetaminophen-caffeine  (FIORICET) 50-325-40 MG tablet TAKE 1 TABLET BY MOUTH EVERY 4 TO 6 HOURS AS NEEDED FOR HEADACHE 30 tablet 1  . Calcium Carbonate-Vit D-Min (CALCIUM 1200 PO) Take 1 capsule by mouth daily.    . diazepam (VALIUM) 10 MG tablet TAKE 1 TABLET BY MOUTH EVERY NIGHT AT BEDTIME AS NEEDED FOR ANXIETY. 30 tablet 2  . diphenoxylate-atropine (LOMOTIL) 2.5-0.025 MG tablet TAKE 1 TABLET BY MOUTH 4 TIMES A DAY AS NEEDED FOR DIARHEA OR LOOSE STOOLS. TAKE WITH IMMODIUM 30 tablet 0  . FLUoxetine (PROZAC) 20 MG capsule TAKE 3 CAPSULES BY MOUTH DAILY 270 capsule 1  . ibuprofen (ADVIL) 100 MG tablet Take 100 mg by mouth every 6 (six) hours as needed for fever.     . levothyroxine (  SYNTHROID) 88 MCG tablet Take 1 tablet (88 mcg total) by mouth daily. 60 tablet 0  . loperamide (IMODIUM A-D) 2 MG tablet Take 4 mg by mouth 4 (four) times daily as needed for diarrhea or loose stools.     . ondansetron (ZOFRAN) 8 MG tablet One pill every 8 hours as needed for nausea/vomitting. 40 tablet 1  . potassium chloride SA (KLOR-CON) 20 MEQ tablet TAKE 1 TABLET BY MOUTH TWICE A DAY 60 tablet 0   No current facility-administered medications for this visit.      Marland Kitchen  PHYSICAL EXAMINATION: ECOG PERFORMANCE STATUS: 0 - Asymptomatic  There were no vitals filed for this visit.  There were no vitals filed for this visit.   Physical Exam Constitutional:      Comments: Accompanied by husband.  HENT:     Head: Normocephalic and atraumatic.     Mouth/Throat:     Pharynx: No oropharyngeal exudate.  Eyes:     Pupils: Pupils are equal, round, and reactive to light.  Cardiovascular:     Rate and Rhythm: Normal rate and regular rhythm.  Pulmonary:     Effort: Pulmonary effort is normal. No respiratory distress.     Breath sounds: Normal breath sounds. No wheezing.  Abdominal:     General: Bowel sounds are normal. There is no distension.     Palpations: Abdomen is soft. There is no mass.     Tenderness: There is no abdominal  tenderness. There is no guarding or rebound.  Musculoskeletal:        General: No tenderness. Normal range of motion.     Cervical back: Normal range of motion and neck supple.  Skin:    General: Skin is warm.  Neurological:     Mental Status: She is alert and oriented to person, place, and time.  Psychiatric:        Mood and Affect: Affect normal.     Comments: Anxious   LABORATORY DATA:  I have reviewed the data as listed Lab Results  Component Value Date   WBC 6.8 01/18/2021   HGB 12.6 01/18/2021   HCT 38.2 01/18/2021   MCV 97.0 01/18/2021   PLT 321 01/18/2021   Recent Labs    02/25/20 1029 04/28/20 1011 06/29/20 1006 07/27/20 0954 10/26/20 1427 01/18/21 1359  NA 136 138   < > 136 136 136  K 4.0 3.5   < > 3.6 4.0 3.7  CL 106 108   < > 101 102 101  CO2 23 21*   < > 25 22 23   GLUCOSE 96 112*   < > 106* 129* 122*  BUN 13 14   < > 16 16 17   CREATININE 0.87 0.77   < > 0.82 1.22* 0.72  CALCIUM 8.7* 9.0   < > 8.9 9.2 8.9  GFRNONAA >60 >60   < > >60 51* >60  GFRAA >60 >60  --   --   --   --   PROT 6.8 7.1   < > 7.1 7.6 7.7  ALBUMIN 3.9 3.9   < > 3.8 4.1 4.0  AST 17 20   < > 20 32 27  ALT 14 17   < > 15 28 24   ALKPHOS 118 104   < > 136* 134* 123  BILITOT 0.4 0.5   < > 0.3 0.2* 0.4   < > = values in this interval not displayed.     RADIOGRAPHIC STUDIES: I have personally reviewed the  radiological images as listed and agreed with the findings in the report. No results found.  ASSESSMENT & PLAN:   No problem-specific Assessment & Plan notes found for this encounter.  All questions were answered. The patient/family knows to call the clinic with any problems, questions or concerns.    Cammie Sickle, MD 02/22/2021 8:36 AM

## 2021-03-11 ENCOUNTER — Other Ambulatory Visit: Payer: Self-pay | Admitting: Internal Medicine

## 2021-03-12 ENCOUNTER — Inpatient Hospital Stay (HOSPITAL_BASED_OUTPATIENT_CLINIC_OR_DEPARTMENT_OTHER): Payer: BC Managed Care – PPO | Admitting: Internal Medicine

## 2021-03-12 ENCOUNTER — Inpatient Hospital Stay: Payer: BC Managed Care – PPO | Attending: Internal Medicine

## 2021-03-12 ENCOUNTER — Other Ambulatory Visit: Payer: Self-pay

## 2021-03-12 DIAGNOSIS — C50412 Malignant neoplasm of upper-outer quadrant of left female breast: Secondary | ICD-10-CM | POA: Insufficient documentation

## 2021-03-12 DIAGNOSIS — G4700 Insomnia, unspecified: Secondary | ICD-10-CM | POA: Insufficient documentation

## 2021-03-12 DIAGNOSIS — Z79899 Other long term (current) drug therapy: Secondary | ICD-10-CM | POA: Diagnosis not present

## 2021-03-12 DIAGNOSIS — Z79811 Long term (current) use of aromatase inhibitors: Secondary | ICD-10-CM | POA: Diagnosis not present

## 2021-03-12 DIAGNOSIS — E785 Hyperlipidemia, unspecified: Secondary | ICD-10-CM | POA: Insufficient documentation

## 2021-03-12 DIAGNOSIS — Z170 Estrogen receptor positive status [ER+]: Secondary | ICD-10-CM

## 2021-03-12 DIAGNOSIS — Z803 Family history of malignant neoplasm of breast: Secondary | ICD-10-CM | POA: Insufficient documentation

## 2021-03-12 DIAGNOSIS — J449 Chronic obstructive pulmonary disease, unspecified: Secondary | ICD-10-CM | POA: Insufficient documentation

## 2021-03-12 DIAGNOSIS — Z791 Long term (current) use of non-steroidal anti-inflammatories (NSAID): Secondary | ICD-10-CM | POA: Diagnosis not present

## 2021-03-12 DIAGNOSIS — K5900 Constipation, unspecified: Secondary | ICD-10-CM | POA: Diagnosis not present

## 2021-03-12 DIAGNOSIS — F329 Major depressive disorder, single episode, unspecified: Secondary | ICD-10-CM | POA: Insufficient documentation

## 2021-03-12 DIAGNOSIS — R110 Nausea: Secondary | ICD-10-CM | POA: Insufficient documentation

## 2021-03-12 DIAGNOSIS — E039 Hypothyroidism, unspecified: Secondary | ICD-10-CM | POA: Insufficient documentation

## 2021-03-12 DIAGNOSIS — Z79890 Hormone replacement therapy (postmenopausal): Secondary | ICD-10-CM | POA: Diagnosis not present

## 2021-03-12 DIAGNOSIS — Z87891 Personal history of nicotine dependence: Secondary | ICD-10-CM | POA: Diagnosis not present

## 2021-03-12 DIAGNOSIS — F4310 Post-traumatic stress disorder, unspecified: Secondary | ICD-10-CM | POA: Insufficient documentation

## 2021-03-12 DIAGNOSIS — E876 Hypokalemia: Secondary | ICD-10-CM | POA: Insufficient documentation

## 2021-03-12 DIAGNOSIS — R197 Diarrhea, unspecified: Secondary | ICD-10-CM | POA: Insufficient documentation

## 2021-03-12 DIAGNOSIS — Z800 Family history of malignant neoplasm of digestive organs: Secondary | ICD-10-CM | POA: Diagnosis not present

## 2021-03-12 LAB — COMPREHENSIVE METABOLIC PANEL
ALT: 18 U/L (ref 0–44)
AST: 24 U/L (ref 15–41)
Albumin: 4.2 g/dL (ref 3.5–5.0)
Alkaline Phosphatase: 145 U/L — ABNORMAL HIGH (ref 38–126)
Anion gap: 9 (ref 5–15)
BUN: 10 mg/dL (ref 6–20)
CO2: 23 mmol/L (ref 22–32)
Calcium: 9.2 mg/dL (ref 8.9–10.3)
Chloride: 104 mmol/L (ref 98–111)
Creatinine, Ser: 1.04 mg/dL — ABNORMAL HIGH (ref 0.44–1.00)
GFR, Estimated: >60 mL/min (ref >60)
Glucose, Bld: 142 mg/dL — ABNORMAL HIGH (ref 70–99)
Potassium: 3.4 mmol/L — ABNORMAL LOW (ref 3.5–5.1)
Sodium: 136 mmol/L (ref 135–145)
Total Bilirubin: 0.4 mg/dL (ref 0.3–1.2)
Total Protein: 7.7 g/dL (ref 6.5–8.1)

## 2021-03-12 LAB — CBC WITH DIFFERENTIAL/PLATELET
Abs Immature Granulocytes: 0.02 10*3/uL (ref 0.00–0.07)
Basophils Absolute: 0.1 10*3/uL (ref 0.0–0.1)
Basophils Relative: 1 %
Eosinophils Absolute: 0.3 10*3/uL (ref 0.0–0.5)
Eosinophils Relative: 4 %
HCT: 40.9 % (ref 36.0–46.0)
Hemoglobin: 13.3 g/dL (ref 12.0–15.0)
Immature Granulocytes: 0 %
Lymphocytes Relative: 19 %
Lymphs Abs: 1.4 10*3/uL (ref 0.7–4.0)
MCH: 31.6 pg (ref 26.0–34.0)
MCHC: 32.5 g/dL (ref 30.0–36.0)
MCV: 97.1 fL (ref 80.0–100.0)
Monocytes Absolute: 0.5 10*3/uL (ref 0.1–1.0)
Monocytes Relative: 7 %
Neutro Abs: 4.9 10*3/uL (ref 1.7–7.7)
Neutrophils Relative %: 69 %
Platelets: 321 10*3/uL (ref 150–400)
RBC: 4.21 MIL/uL (ref 3.87–5.11)
RDW: 12.9 % (ref 11.5–15.5)
WBC: 7.2 10*3/uL (ref 4.0–10.5)
nRBC: 0 % (ref 0.0–0.2)

## 2021-03-12 LAB — TSH: TSH: 1.178 u[IU]/mL (ref 0.350–4.500)

## 2021-03-12 NOTE — Progress Notes (Signed)
[facility] [place] NOTE  Patient Care Team: Tonia Ghent, MD as PCP - General (Family Medicine) Cammie Sickle, MD as Consulting Physician (Internal Medicine) Rolm Bookbinder, MD as Consulting Physician (General Surgery) Rico Junker, [name]  #  Oncology History Overview Note  # DEC 2020- LEFT BREAST-TWO PRIM ARIES:   #A]-IMC; ER/PR >90%; Her 2-IHC-2+; FISH-NEG-2 ; MRI- 3.7 CM; s/p s/p lumpectomy; s/p reexcision-final margins negative.  Stage Ib.    #B] Bx- IMC; G-1; ER/PR-pos; her2 NEG. s/p lumpectomy; margins negative [as per Dr.Wakefield; discussed at [facility] tumor conference]  # MARCH 31st 2021- START Taxotere [60 mg/m]; Cytoxan; growth factor x4 cycles; s/p radiation 04/29/2020  # MID-OCT 2021- Start anastrozole; MID MARCH 2022- stopped sec to intolerance; MID April 20222- START AROMASIN  #Extreme anxiety/chronic diarrhea/chronic severe hypokalemia/history of migraines/depression  Used estrogen and progesterone therapy: on premarin for post- menopausal x 2-3 years- discontinued in JAN 2021.   # SURVIVORSHIP:   DIAGNOSIS: Breast cancer  STAGE: IB & I     ;  GOALS: Cure  CURRENT/MOST RECENT THERAPY : Anastrozole    Carcinoma of upper-outer quadrant of left breast in female, estrogen receptor positive ([place])  08/14/2019 Initial Diagnosis   Carcinoma of upper-outer quadrant of left breast in female, estrogen receptor positive ([place])   09/10/2019 Genetic Testing   Negative genetic testing on the STAT and common hereditary cancer panel.  The Common Hereditary Gene Panel offered by [facility] includes sequencing and/or deletion duplication testing of the following 48 genes: APC, ATM, AXIN2, BARD1, BMPR1A, BRCA1, BRCA2, BRIP1, CDH1, CDK4, CDKN2A (p14ARF), CDKN2A (p16INK4a), CHEK2, CTNNA1, DICER1, EPCAM (Deletion/duplication testing only), GREM1 (promoter region  deletion/duplication testing only), KIT, MEN1, MLH1, MSH2, MSH3, MSH6, MUTYH, NBN, NF1, NHTL1, PALB2, PDGFRA, PMS2, POLD1, POLE, PTEN, RAD50, RAD51C, RAD51D, RNF43, SDHB, SDHC, SDHD, SMAD4, SMARCA4. STK11, TP53, TSC1, TSC2, and VHL.  The following genes were evaluated for sequence changes only: SDHA and HOXB13 c.251G>A variant only. The report date is September 10, 2019.   10/03/2019 Cancer Staging   Staging form: Breast, AJCC 8th Edition - Pathologic stage from 10/03/2019: Stage IB (pT2, pN1a, cM0, G2, ER+, PR+, HER2-) - Signed by Gardenia Phlegm, NP on 10/16/2019    10/09/2019 - 10/09/2019 Chemotherapy   The patient had dexamethasone (DECADRON) 4 MG tablet, 0 of 1 cycle, Start date: --, End date: -- DOXOrubicin (ADRIAMYCIN) chemo injection 98 mg, 60 mg/m2, Intravenous,  Once, 0 of 4 cycles PALONOSETRON HCL INJECTION 0.25 MG/5ML, 0.25 mg, Intravenous,  Once, 0 of 4 cycles pegfilgrastim-jmdb (FULPHILA) injection 6 mg, 6 mg, Subcutaneous,  Once, 0 of 4 cycles cyclophosphamide (CYTOXAN) 980 mg in sodium chloride 0.9 % 250 mL chemo infusion, 600 mg/m2, Intravenous,  Once, 0 of 4 cycles PACLitaxel (TAXOL) 132 mg in sodium chloride 0.9 % 250 mL chemo infusion (</= 47m/m2), 80 mg/m2, Intravenous,  Once, 0 of 12 cycles FOSAPREPITANT IV INFUSION 150 MG, 150 mg, Intravenous,  Once, 0 of 4 cycles   for chemotherapy treatment.     11/13/2019 -  Chemotherapy   The patient had dexamethasone (DECADRON) 4 MG tablet, 8 mg, Oral, 2 times daily, 1 of 1 cycle, Start date: --, End date: -- palonosetron (ALOXI) injection 0.25 mg, 0.25 mg, Intravenous,  Once, 4 of 4 cycles Administration: 0.25 mg (11/13/2019), 0.25 mg (12/04/2019), 0.25 mg (12/25/2019), 0.25 mg (01/15/2020) pegfilgrastim-jmdb (FULPHILA) injection 6 mg, 6 mg, Subcutaneous,  Once, 4 of 4 cycles  Administration: 6 mg (11/14/2019), 6 mg (12/05/2019), 6 mg (12/26/2019), 6 mg (01/16/2020) cyclophosphamide (CYTOXAN) 1,000 mg in sodium chloride 0.9 % 250 mL chemo  infusion, 980 mg, Intravenous,  Once, 4 of 4 cycles Administration: 1,000 mg (11/13/2019), 1,000 mg (12/04/2019), 1,000 mg (12/25/2019), 1,000 mg (01/15/2020) DOCEtaxel (TAXOTERE) 100 mg in sodium chloride 0.9 % 250 mL chemo infusion, 60 mg/m2 = 100 mg (100 % of original dose 60 mg/m2), Intravenous,  Once, 4 of 4 cycles Dose modification: 60 mg/m2 (original dose 60 mg/m2, Cycle 1, Reason: Provider Judgment) Administration: 100 mg (11/13/2019), 100 mg (12/04/2019), 100 mg (12/25/2019), 100 mg (01/15/2020)   for chemotherapy treatment.        HISTORY OF PRESENTING ILLNESS:  Jessica Barnes 60 y.o.  female with of breast cancer  [T2N1-ER/PR positive HER-2 negative; T1N1-ER/PR positive HER-2 negative 2 separate primaries] s/p adjuvant chemotherapy; also s/p adjuvant radiation currently on Aromasin is here for follow-up.  Denies any worsening hot flashes.  Denies any worsening fatigue.  Chronic joint pains not any worse.  No chest pain or shortness of breath or cough.  Continues to have swelling of the left chest wall/left underarm.;  Patient reluctant with lymphedema clinic-she continues swelling of the left underarm/left chest wall.  Review of Systems  Constitutional:  Positive for malaise/fatigue. Negative for chills, diaphoresis, fever and weight loss.  HENT:  Negative for nosebleeds and sore throat.   Eyes:  Negative for double vision.  Respiratory:  Negative for cough, hemoptysis, sputum production and wheezing.   Cardiovascular:  Negative for chest pain, palpitations, orthopnea and leg swelling.  Gastrointestinal:  Negative for abdominal pain, blood in stool, constipation, heartburn, melena, nausea and vomiting.  Genitourinary:  Negative for dysuria, frequency and urgency.  Musculoskeletal:  Negative for back pain and joint pain.  Neurological:  Negative for dizziness, tingling, focal weakness and weakness.  Endo/Heme/Allergies:  Does not bruise/bleed easily.  Psychiatric/Behavioral:   The patient is nervous/anxious and has insomnia.     MEDICAL HISTORY:  Past Medical History:  Diagnosis Date  . Anxiety 10/13/96  . Chest pain    atypical, hosp for that and depression 8/10-8/11/08  . COPD (chronic obstructive pulmonary disease) ([place]) 09/15/05   by x-ray  . Depression 10/13/96   hospital 8/10-8/11/08.  notable history: son with TBI after MVA  . Family history of breast cancer   . Family history of colon cancer   . Family history of pancreatic cancer   . Hyperlipidemia 09/15/98  . Hypothyroidism 01/14/96  . Migraine    27 yoa  . NSVD (normal spontaneous vaginal delivery)    x 1  . Personal history of chemotherapy    finished 01/2020  . Personal history of radiation therapy    finished 04/2020  . PONV (postoperative nausea and vomiting)   . PTSD (post-traumatic stress disorder)    likely PTSD after son's MVA/TBI in 2007, s/p counseling    SURGICAL HISTORY: Past Surgical History:  Procedure Laterality Date  . APPENDECTOMY     60 years of age  . BREAST BIOPSY Left 08/06/2019   [place] bx/ ribbon clip/ invasive mammary carcinoma  . BREAST BIOPSY Left 09/06/2019   [place] bx, heart clip, invasive mammary carcinoma   . BREAST LUMPECTOMY Left 10/21/2019   Inv ductal Ca and DCIS   . BREAST LUMPECTOMY WITH RADIOACTIVE SEED AND SENTINEL LYMPH NODE BIOPSY Left 10/01/2019   Procedure: LEFT BREAST LUMPECTOMY WITH BRACKETED RADIOACTIVE SEEDS AND LEFT AXILLARY SENTINEL LYMPH NODE BIOPSY;  Surgeon: Donne Hazel,  Rodman Key, MD;  Location: [place] [facility];  Service: General;  Laterality: Left;  . PORTACATH PLACEMENT Right 10/28/2019   Procedure: INSERTION PORT-A-CATH WITH ULTRASOUND GUIDANCE;  Surgeon: Rolm Bookbinder, MD;  Location: [place];  Service: General;  Laterality: Right;  . RE-EXCISION OF BREAST LUMPECTOMY Left 10/28/2019   Procedure: RE-EXCISION OF LEFT BREAST LUMPECTOMY;  Surgeon: Rolm Bookbinder, MD;  Location: [facility];  Service:  General;  Laterality: Left;    SOCIAL HISTORY: Social History   Socioeconomic History  . Marital status: Married    Spouse name: Not on file  . Number of children: Not on file  . Years of education: Not on file  . Highest education level: Not on file  Occupational History  . Not on file  Tobacco Use  . Smoking status: Former    Packs/day: 0.50    Years: 8.00    Pack years: 4.00    Types: Cigarettes  . Smokeless tobacco: Never  . Tobacco comments:    quit 04/15/2014  Vaping Use  . Vaping Use: Every day  Substance and Sexual Activity  . Alcohol use: No    Alcohol/week: 0.0 standard drinks  . Drug use: No  . Sexual activity: Not on file  Other Topics Concern  . Not on file  Social History Narrative   Remarried, lives with husband 1 son, he had TBI and short term memory loss after prolonged illness      #Patient lives in [place] with her husband; prior history of smoking/currently vapes.  No significant alcohol use.  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    Social Determinants of Health   Financial Resource Strain: Not on file  Food Insecurity: Not on file  Transportation Needs: Not on file  Physical Activity: Not on file  Stress: Not on file  Social Connections: Not on file  Intimate Partner Violence: Not on file    FAMILY HISTORY: Family History  Problem Relation Age of Onset  . Hypertension Mother   . Colon cancer Father        d. 55  . Thyroid disease Sister   . Breast cancer Paternal Aunt   . Breast cancer Paternal Grandmother   . Colon cancer Paternal Grandmother   . Breast cancer Cousin        paternal aunt's daughter  . Colon cancer Paternal Uncle        3 pat uncles with colon cancer at unknown ages  . Pancreatic cancer Maternal Uncle        d. 40  . Breast cancer Paternal Aunt     ALLERGIES:  is allergic to sulfamethoxazole-trimethoprim and codeine.  MEDICATIONS:  Current Outpatient Medications  Medication Sig Dispense Refill  .  anastrozole (ARIMIDEX) 1 MG tablet Take 1 tablet (1 mg total) by mouth daily. 60 tablet 1  . butalbital-acetaminophen-caffeine (FIORICET) 50-325-40 MG tablet TAKE 1 TABLET BY MOUTH EVERY 4 TO 6 HOURS AS NEEDED FOR HEADACHE 30 tablet 1  . Calcium Carbonate-Vit D-Min (CALCIUM 1200 PO) Take 1 capsule by mouth daily.    . diazepam (VALIUM) 10 MG tablet TAKE 1 TABLET BY MOUTH EVERY NIGHT AT BEDTIME AS NEEDED FOR ANXIETY. 30 tablet 2  . diphenoxylate-atropine (LOMOTIL) 2.5-0.025 MG tablet TAKE 1 TABLET BY MOUTH 4 TIMES A DAY AS NEEDED FOR DIARHEA OR LOOSE STOOLS. TAKE WITH IMMODIUM 30 tablet 0  . FLUoxetine (PROZAC) 20 MG capsule TAKE 3 CAPSULES BY MOUTH DAILY 270 capsule 1  . ibuprofen (ADVIL)  100 MG tablet Take 100 mg by mouth every 6 (six) hours as needed for fever.     . levothyroxine (SYNTHROID) 88 MCG tablet Take 1 tablet (88 mcg total) by mouth daily. 60 tablet 0  . loperamide (IMODIUM A-D) 2 MG tablet Take 4 mg by mouth 4 (four) times daily as needed for diarrhea or loose stools.     . ondansetron (ZOFRAN) 8 MG tablet One pill every 8 hours as needed for nausea/vomitting. 40 tablet 1  . potassium chloride SA (KLOR-CON) 20 MEQ tablet TAKE 1 TABLET BY MOUTH TWICE A DAY 60 tablet 0   No current facility-administered medications for this visit.      Marland Kitchen  PHYSICAL EXAMINATION: ECOG PERFORMANCE STATUS: 0 - Asymptomatic  Vitals:   03/12/21 1526  BP: 119/75  Pulse: (!) 108  Resp: 18  Temp: 98.8 F (37.1 C)  SpO2: 97%   Filed Weights   03/12/21 1526  Weight: 147 lb (66.7 kg)    Physical Exam Constitutional:      Comments: Accompanied by her sister.  Ambulating independently.  HENT:     Head: Normocephalic and atraumatic.     Mouth/Throat:     Pharynx: No oropharyngeal exudate.  Eyes:     Pupils: Pupils are equal, round, and reactive to light.  Cardiovascular:     Rate and Rhythm: Normal rate and regular rhythm.  Pulmonary:     Effort: Pulmonary effort is normal. No respiratory  distress.     Breath sounds: Normal breath sounds. No wheezing.  Abdominal:     General: Bowel sounds are normal. There is no distension.     Palpations: Abdomen is soft. There is no mass.     Tenderness: There is no abdominal tenderness. There is no guarding or rebound.  Musculoskeletal:        General: No tenderness. Normal range of motion.     Cervical back: Normal range of motion and neck supple.  Skin:    General: Skin is warm.     Comments: Mild left underarm swelling/chest wall swelling.  Neurological:     Mental Status: She is alert and oriented to person, place, and time.  Psychiatric:        Mood and Affect: Affect normal.     Comments: Anxious   LABORATORY DATA:  I have reviewed the data as listed Lab Results  Component Value Date   WBC 7.2 03/12/2021   HGB 13.3 03/12/2021   HCT 40.9 03/12/2021   MCV 97.1 03/12/2021   PLT 321 03/12/2021   Recent Labs    04/28/20 1011 06/29/20 1006 10/26/20 1427 01/18/21 1359 03/12/21 1502  NA 138   < > 136 136 136  K 3.5   < > 4.0 3.7 3.4*  CL 108   < > 102 101 104  CO2 21*   < > $R'22 23 23  'Wc$ GLUCOSE 112*   < > 129* 122* 142*  BUN 14   < > $R'16 17 10  'BU$ CREATININE 0.77   < > 1.22* 0.72 1.04*  CALCIUM 9.0   < > 9.2 8.9 9.2  GFRNONAA >60   < > 51* >60 >60  GFRAA >60  --   --   --   --   PROT 7.1   < > 7.6 7.7 7.7  ALBUMIN 3.9   < > 4.1 4.0 4.2  AST 20   < > 32 27 24  ALT 17   < > 28 24 18  ALKPHOS 104   < > 134* 123 145*  BILITOT 0.5   < > 0.2* 0.4 0.4   < > = values in this interval not displayed.    RADIOGRAPHIC STUDIES: I have personally reviewed the radiological images as listed and agreed with the findings in the report. No results found.  ASSESSMENT & PLAN:   Carcinoma of upper-outer quadrant of left breast in female, estrogen receptor positive ([place]) # Left breast cancer-2 separate primaries- A] T2N1-ER/PR positive HER-2 negative;  B] T1N1 ER/PR positive HER-2 negative. S/p 4 cycles of adjuvant chemotherapy  Taxotere [60 mg/m] Cytoxan. S/p  RT [last 9/15].  STABLE; No evidence of recurrence.  Discuss adj. Zometa.  # currently on Aromasin-tolerating with mild to moderate side effects.  Continue Aromasin for now.  # Chronic hypokalemia/diarrhea-potassium-3.4; on [place];  # Swelling under the left underarm/chest wall-likely lymphedema-reiterated the importance of lymphedema physical therapy.  Patient reluctantly agrees.  She states that she will call.  # Chronic intermittent nausea- 1-2 a month- improved on Zofran.STABLE;  # Elevated LFTs-unclear etiology s/p extensive work-up/GI evaluation [2021] alkaline phosphatase-134; STABLE;  # BMD- T score= 0.9; continue Vit D [ca-constipation]; STABLE; hold off adjuvant Zometa.   # Fatigue/hypothyroidism-improved currently on Synthroid 88 mcg-TSH today 1.7 stable.   # Insomnia/likely secondary to anxiety- on valium improved.  Stable  # DISPOSITION:  # Bil mammogram Diagnostic- in 1week #follow up in 3 months-MD; labs- cbc/cmp-Dr.B..  All questions were answered. The patient/family knows to call the clinic with any problems, questions or concerns.    Cammie Sickle, MD 03/13/2021 10:13 AM

## 2021-03-12 NOTE — Assessment & Plan Note (Addendum)
#  Left breast cancer-2 separate primaries- A] T2N1-ER/PR positive HER-2 negative;  B] T1N1 ER/PR positive HER-2 negative. S/p 4 cycles of adjuvant chemotherapy Taxotere [60 mg/m] Cytoxan. S/p  RT [last 9/15].  STABLE; No evidence of recurrence.  Discuss adj. Zometa.  # currently on Aromasin-tolerating with mild to moderate side effects.  Continue Aromasin for now.  # Chronic hypokalemia/diarrhea-potassium-3.4; on [place];  # Swelling under the left underarm/chest wall-likely lymphedema-reiterated the importance of lymphedema physical therapy.  Patient reluctantly agrees.  She states that she will call.  # Chronic intermittent nausea- 1-2 a month- improved on Zofran.STABLE;  # Elevated LFTs-unclear etiology s/p extensive work-up/GI evaluation [2021] alkaline phosphatase-134; STABLE;  # BMD- T score= 0.9; continue Vit D [ca-constipation]; STABLE; hold off adjuvant Zometa.   # Fatigue/hypothyroidism-improved currently on Synthroid 88 mcg-TSH today 1.7 stable.   # Insomnia/likely secondary to anxiety- on valium improved.  Stable  # DISPOSITION:  # Bil mammogram Diagnostic- in 1week #follow up in 3 months-MD; labs- cbc/cmp-Dr.B..

## 2021-03-13 ENCOUNTER — Encounter: Payer: Self-pay | Admitting: Internal Medicine

## 2021-03-13 ENCOUNTER — Other Ambulatory Visit: Payer: Self-pay | Admitting: Internal Medicine

## 2021-03-15 ENCOUNTER — Encounter: Payer: Self-pay | Admitting: Internal Medicine

## 2021-03-21 ENCOUNTER — Other Ambulatory Visit: Payer: Self-pay | Admitting: Family Medicine

## 2021-03-21 MED ORDER — BUTALBITAL-APAP-CAFFEINE 50-325-40 MG PO TABS: ORAL_TABLET | ORAL | 1 refills | Status: DC

## 2021-03-23 ENCOUNTER — Other Ambulatory Visit: Payer: Self-pay | Admitting: Nurse Practitioner

## 2021-03-25 ENCOUNTER — Other Ambulatory Visit: Payer: Self-pay

## 2021-03-25 ENCOUNTER — Ambulatory Visit
Admission: RE | Admit: 2021-03-25 | Discharge: 2021-03-25 | Disposition: A | Discharge status: Home / Self Care | Payer: BC Managed Care – PPO | Source: Ambulatory Visit | Attending: Internal Medicine | Admitting: Internal Medicine

## 2021-03-25 ENCOUNTER — Ambulatory Visit
Admission: RE | Admit: 2021-03-25 | Discharge: 2021-03-25 | Disposition: A | Discharge status: Home / Self Care | Payer: BC Managed Care – PPO | Source: Ambulatory Visit | Attending: Oncology | Admitting: Oncology

## 2021-03-25 DIAGNOSIS — C50412 Malignant neoplasm of upper-outer quadrant of left female breast: Secondary | ICD-10-CM

## 2021-03-25 DIAGNOSIS — C50912 Malignant neoplasm of unspecified site of left female breast: Secondary | ICD-10-CM

## 2021-03-25 DIAGNOSIS — Z853 Personal history of malignant neoplasm of breast: Secondary | ICD-10-CM | POA: Diagnosis not present

## 2021-03-25 DIAGNOSIS — R922 Inconclusive mammogram: Secondary | ICD-10-CM | POA: Diagnosis not present

## 2021-03-25 DIAGNOSIS — Z170 Estrogen receptor positive status [ER+]: Secondary | ICD-10-CM | POA: Insufficient documentation

## 2021-03-26 ENCOUNTER — Other Ambulatory Visit: Payer: Self-pay | Admitting: Family Medicine

## 2021-03-26 NOTE — Telephone Encounter (Signed)
Last filled 02-24-21 #30 Last OV 07-02-20 Virtual Visit No Future [place]

## 2021-03-28 NOTE — Telephone Encounter (Signed)
Sent. Thanks.   

## 2021-04-22 ENCOUNTER — Telehealth: Payer: Self-pay | Admitting: *Deleted

## 2021-04-22 NOTE — Telephone Encounter (Signed)
Patient called reporting that she feels a new lump in her breast and would like to be checked for this. She states she has recently had a mammogram prior to finding the new lump. Please advise

## 2021-04-22 NOTE — Telephone Encounter (Signed)
Patient reports feeling a 'mass on the left side of her chest.' Mass is 'tender' to touch.   Patient has a h/o of lymphema in her left axillary area.  Pt agrees to see Ander Purpura, NP on Monday 9/12 for evaluation of the area of concern.

## 2021-04-26 ENCOUNTER — Inpatient Hospital Stay: Payer: BC Managed Care – PPO | Attending: Nurse Practitioner | Admitting: Nurse Practitioner

## 2021-04-26 VITALS — BP 102/55 | HR 92 | Temp 97.9°F | Resp 16 | Wt 144.8 lb

## 2021-04-26 DIAGNOSIS — Z87891 Personal history of nicotine dependence: Secondary | ICD-10-CM | POA: Diagnosis not present

## 2021-04-26 DIAGNOSIS — Z79899 Other long term (current) drug therapy: Secondary | ICD-10-CM | POA: Diagnosis not present

## 2021-04-26 DIAGNOSIS — E039 Hypothyroidism, unspecified: Secondary | ICD-10-CM | POA: Insufficient documentation

## 2021-04-26 DIAGNOSIS — Z923 Personal history of irradiation: Secondary | ICD-10-CM | POA: Insufficient documentation

## 2021-04-26 DIAGNOSIS — Z79811 Long term (current) use of aromatase inhibitors: Secondary | ICD-10-CM | POA: Insufficient documentation

## 2021-04-26 DIAGNOSIS — F4310 Post-traumatic stress disorder, unspecified: Secondary | ICD-10-CM | POA: Diagnosis not present

## 2021-04-26 DIAGNOSIS — Z170 Estrogen receptor positive status [ER+]: Secondary | ICD-10-CM | POA: Insufficient documentation

## 2021-04-26 DIAGNOSIS — N6332 Unspecified lump in axillary tail of the left breast: Secondary | ICD-10-CM | POA: Diagnosis not present

## 2021-04-26 DIAGNOSIS — Z803 Family history of malignant neoplasm of breast: Secondary | ICD-10-CM | POA: Insufficient documentation

## 2021-04-26 DIAGNOSIS — Z800 Family history of malignant neoplasm of digestive organs: Secondary | ICD-10-CM | POA: Insufficient documentation

## 2021-04-26 DIAGNOSIS — Z9221 Personal history of antineoplastic chemotherapy: Secondary | ICD-10-CM | POA: Diagnosis not present

## 2021-04-26 DIAGNOSIS — E876 Hypokalemia: Secondary | ICD-10-CM | POA: Diagnosis not present

## 2021-04-26 DIAGNOSIS — E785 Hyperlipidemia, unspecified: Secondary | ICD-10-CM | POA: Insufficient documentation

## 2021-04-26 DIAGNOSIS — C50412 Malignant neoplasm of upper-outer quadrant of left female breast: Secondary | ICD-10-CM | POA: Diagnosis not present

## 2021-04-26 DIAGNOSIS — N6320 Unspecified lump in the left breast, unspecified quadrant: Secondary | ICD-10-CM

## 2021-04-27 ENCOUNTER — Telehealth: Payer: Self-pay | Admitting: *Deleted

## 2021-04-27 DIAGNOSIS — N63 Unspecified lump in unspecified breast: Secondary | ICD-10-CM

## 2021-04-27 DIAGNOSIS — C50912 Malignant neoplasm of unspecified site of left female breast: Secondary | ICD-10-CM

## 2021-04-27 NOTE — Telephone Encounter (Signed)
[name]- please advise. 

## 2021-04-27 NOTE — Telephone Encounter (Signed)
Patient called stating that prescription was to have been sent to pharmacy for her yesterday and was not sent in. She is also asking about an appointment that is to be scheduled for her too.

## 2021-04-28 ENCOUNTER — Encounter: Payer: Self-pay | Admitting: Internal Medicine

## 2021-04-28 MED ORDER — ALPRAZOLAM 0.5 MG PO TABS: 0.2500 mg | ORAL_TABLET | Freq: Two times a day (BID) | ORAL | 0 refills | Status: DC | PRN

## 2021-04-28 NOTE — Telephone Encounter (Signed)
Jessica Barnes sent xanax prescription to patient's pharmacy.

## 2021-04-28 NOTE — Telephone Encounter (Signed)
MRI left breast ordered- Breast mass, palpable, prior mammo negative (Age >= 40y); left breast 1-2:00 oclock; 2-3 cm from surgical scar  Jessica Barnes. Patient needs MRI Left Breast. Please schedule and contact patient with new apts.   I spoke with patient. She is aware that the Xanax prescription was sent to pharmacy and the plan for an MRI of Breast. I explained to her that her insurance may needs to approve the MRI prior to scheduling. Also, [facility] may ask for additional imaging prior to MRI

## 2021-04-28 NOTE — Progress Notes (Signed)
[facility]  [phone] [phone]  Patient Care Team: Tonia Ghent, MD as PCP - General (Family Medicine) Cammie Sickle, MD as Consulting Physician (Internal Medicine) Rolm Bookbinder, MD as Consulting Physician (General Surgery) Rico Junker, [name]   Name of the patient: Jessica Barnes  [id number]  1960/11/13   Date of visit: 04/28/21  Diagnosis- Left Breast Cancer - 2 primaries  Chief complaint/ Reason for visit- New left breast mass  Heme/Onc history:  Oncology History Overview Note  # DEC 2020- LEFT BREAST-TWO PRIM ARIES:   #A]-IMC; ER/PR >90%; Her 2-IHC-2+; FISH-NEG-2 ; MRI- 3.7 CM; s/p s/p lumpectomy; s/p reexcision-final margins negative.  Stage Ib.    #B] Bx- IMC; G-1; ER/PR-pos; her2 NEG. s/p lumpectomy; margins negative [as per Dr.Wakefield; discussed at [facility] tumor conference]  # MARCH 31st 2021- START Taxotere [60 mg/m]; Cytoxan; growth factor x4 cycles; s/p radiation 04/29/2020  # MID-OCT 2021- Start anastrozole; MID MARCH 2022- stopped sec to intolerance; MID April 20222- START AROMASIN  #Extreme anxiety/chronic diarrhea/chronic severe hypokalemia/history of migraines/depression  Used estrogen and progesterone therapy: on premarin for post- menopausal x 2-3 years- discontinued in JAN 2021.   # SURVIVORSHIP:   DIAGNOSIS: Breast cancer  STAGE: IB & I     ;  GOALS: Cure  CURRENT/MOST RECENT THERAPY : Anastrozole    Carcinoma of upper-outer quadrant of left breast in female, estrogen receptor positive ([place])  08/14/2019 Initial Diagnosis   Carcinoma of upper-outer quadrant of left breast in female, estrogen receptor positive ([place])   09/10/2019 Genetic Testing   Negative genetic testing on the STAT and common hereditary cancer panel.  The Common Hereditary Gene Panel offered by [facility] includes sequencing and/or deletion duplication testing of  the following 48 genes: APC, ATM, AXIN2, BARD1, BMPR1A, BRCA1, BRCA2, BRIP1, CDH1, CDK4, CDKN2A (p14ARF), CDKN2A (p16INK4a), CHEK2, CTNNA1, DICER1, EPCAM (Deletion/duplication testing only), GREM1 (promoter region deletion/duplication testing only), KIT, MEN1, MLH1, MSH2, MSH3, MSH6, MUTYH, NBN, NF1, NHTL1, PALB2, PDGFRA, PMS2, POLD1, POLE, PTEN, RAD50, RAD51C, RAD51D, RNF43, SDHB, SDHC, SDHD, SMAD4, SMARCA4. STK11, TP53, TSC1, TSC2, and VHL.  The following genes were evaluated for sequence changes only: SDHA and HOXB13 c.251G>A variant only. The report date is September 10, 2019.   10/03/2019 Cancer Staging   Staging form: Breast, AJCC 8th Edition - Pathologic stage from 10/03/2019: Stage IB (pT2, pN1a, cM0, G2, ER+, PR+, HER2-) - Signed by Gardenia Phlegm, NP on 10/16/2019   10/09/2019 - 10/09/2019 Chemotherapy   The patient had dexamethasone (DECADRON) 4 MG tablet, 0 of 1 cycle, Start date: --, End date: -- DOXOrubicin (ADRIAMYCIN) chemo injection 98 mg, 60 mg/m2, Intravenous,  Once, 0 of 4 cycles PALONOSETRON HCL INJECTION 0.25 MG/5ML, 0.25 mg, Intravenous,  Once, 0 of 4 cycles pegfilgrastim-jmdb (FULPHILA) injection 6 mg, 6 mg, Subcutaneous,  Once, 0 of 4 cycles cyclophosphamide (CYTOXAN) 980 mg in sodium chloride 0.9 % 250 mL chemo infusion, 600 mg/m2, Intravenous,  Once, 0 of 4 cycles PACLitaxel (TAXOL) 132 mg in sodium chloride 0.9 % 250 mL chemo infusion (</= 9m/m2), 80 mg/m2, Intravenous,  Once, 0 of 12 cycles FOSAPREPITANT IV INFUSION 150 MG, 150 mg, Intravenous,  Once, 0 of 4 cycles   for chemotherapy treatment.     11/13/2019 -  Chemotherapy   The patient had dexamethasone (DECADRON) 4 MG tablet, 8 mg, Oral, 2 times daily, 1 of 1 cycle, Start date: --, End date: -- palonosetron (ALOXI) injection 0.25 mg,  0.25 mg, Intravenous,  Once, 4 of 4 cycles Administration: 0.25 mg (11/13/2019), 0.25 mg (12/04/2019), 0.25 mg (12/25/2019), 0.25 mg (01/15/2020) pegfilgrastim-jmdb (FULPHILA)  injection 6 mg, 6 mg, Subcutaneous,  Once, 4 of 4 cycles Administration: 6 mg (11/14/2019), 6 mg (12/05/2019), 6 mg (12/26/2019), 6 mg (01/16/2020) cyclophosphamide (CYTOXAN) 1,000 mg in sodium chloride 0.9 % 250 mL chemo infusion, 980 mg, Intravenous,  Once, 4 of 4 cycles Administration: 1,000 mg (11/13/2019), 1,000 mg (12/04/2019), 1,000 mg (12/25/2019), 1,000 mg (01/15/2020) DOCEtaxel (TAXOTERE) 100 mg in sodium chloride 0.9 % 250 mL chemo infusion, 60 mg/m2 = 100 mg (100 % of original dose 60 mg/m2), Intravenous,  Once, 4 of 4 cycles Dose modification: 60 mg/m2 (original dose 60 mg/m2, Cycle 1, Reason: Provider Judgment) Administration: 100 mg (11/13/2019), 100 mg (12/04/2019), 100 mg (12/25/2019), 100 mg (01/15/2020)   for chemotherapy treatment.       Interval history- Patient is 60 year old female who [facility] complaints of left breast mass. She complains of left breast fullness and abnormality. She palpated this mass during routine breast exam. She has left axillary edema and possible history of lymphedema that comes and goes. Area is not painful. She has not had a mass in this area previously but says her first breast cancer presented in this manner. Very concerned for new or recurrent breast cancer. Not resting due to worrying. No unintentional weight loss, fevers, chills. Feels well otherwise.   Review of systems- Review of Systems  Constitutional:  Negative for chills, fever, malaise/fatigue and weight loss.  HENT:  Negative for hearing loss, nosebleeds, sore throat and tinnitus.   Eyes:  Negative for blurred vision and double vision.  Respiratory:  Negative for cough, hemoptysis, shortness of breath and wheezing.   Cardiovascular:  Negative for chest pain, palpitations and leg swelling.  Gastrointestinal:  Negative for abdominal pain, blood in stool, constipation, diarrhea, melena, nausea and vomiting.  Genitourinary:  Negative for dysuria and urgency.   Musculoskeletal:  Negative for back pain, falls, joint pain and myalgias.  Skin:  Negative for itching and rash.  Neurological:  Negative for dizziness, tingling, sensory change, loss of consciousness, weakness and headaches.  Endo/Heme/Allergies:  Negative for environmental allergies. Does not bruise/bleed easily.  Psychiatric/Behavioral:  Negative for depression. The patient is nervous/anxious and has insomnia.     Current treatment- Arimidex  Allergies  Allergen Reactions   Sulfamethoxazole-Trimethoprim     Lip and tongue edema   Codeine     GI upset    Past Medical History:  Diagnosis Date   Anxiety 10/13/96   Chest pain    atypical, hosp for that and depression 8/10-8/11/08   COPD (chronic obstructive pulmonary disease) ([place]) 09/15/05   by x-ray   Depression 10/13/96   hospital 8/10-8/11/08.  notable history: son with TBI after MVA   Family history of breast cancer    Family history of colon cancer    Family history of pancreatic cancer    Hyperlipidemia 09/15/98   Hypothyroidism 01/14/96   Migraine    25 yoa   NSVD (normal spontaneous vaginal delivery)    x 1   Personal history of chemotherapy    finished 01/2020   Personal history of radiation therapy    finished 04/2020   PONV (postoperative nausea and vomiting)    PTSD (post-traumatic stress disorder)    likely PTSD after son's MVA/TBI in 2007, s/p counseling    Past Surgical History:  Procedure Laterality Date  APPENDECTOMY     60 years of age   BREAST BIOPSY Left 08/06/2019   [place] bx/ ribbon clip/ invasive mammary carcinoma   BREAST BIOPSY Left 09/06/2019   [place] bx, heart clip, invasive mammary carcinoma    BREAST LUMPECTOMY Left 10/21/2019   Inv ductal Ca and DCIS    BREAST LUMPECTOMY WITH RADIOACTIVE SEED AND SENTINEL LYMPH NODE BIOPSY Left 10/01/2019   Procedure: LEFT BREAST LUMPECTOMY WITH BRACKETED RADIOACTIVE SEEDS AND LEFT AXILLARY SENTINEL LYMPH NODE BIOPSY;  Surgeon: Rolm Bookbinder, MD;  Location:  [facility];  Service: General;  Laterality: Left;   PORTACATH PLACEMENT Right 10/28/2019   Procedure: INSERTION PORT-A-CATH WITH ULTRASOUND GUIDANCE;  Surgeon: Rolm Bookbinder, MD;  Location: [facility];  Service: General;  Laterality: Right;   RE-EXCISION OF BREAST LUMPECTOMY Left 10/28/2019   Procedure: RE-EXCISION OF LEFT BREAST LUMPECTOMY;  Surgeon: Rolm Bookbinder, MD;  Location: [facility];  Service: General;  Laterality: Left;    Social History   Socioeconomic History   Marital status: Married    Spouse name: Not on file   Number of children: Not on file   Years of education: Not on file   Highest education level: Not on file  Occupational History   Not on file  Tobacco Use   Smoking status: Former    Packs/day: 0.50    Years: 8.00    Pack years: 4.00    Types: Cigarettes   Smokeless tobacco: Never   Tobacco comments:    quit 04/15/2014  Vaping Use   Vaping Use: Every day  Substance and Sexual Activity   Alcohol use: No    Alcohol/week: 0.0 standard drinks   Drug use: No   Sexual activity: Not on file  Other Topics Concern   Not on file  Social History Narrative   Remarried, lives with husband 1 son, he had TBI and short term memory loss after prolonged illness      #Patient lives in [place] with her husband; prior history of smoking/currently vapes.  No significant alcohol use.  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    Social Determinants of Health   Financial Resource Strain: Not on file  Food Insecurity: Not on file  Transportation Needs: Not on file  Physical Activity: Not on file  Stress: Not on file  Social Connections: Not on file  Intimate Partner Violence: Not on file    Family History  Problem Relation Age of Onset   Hypertension Mother    Colon cancer Father        d. 39   Thyroid disease Sister    Breast cancer Paternal Aunt    Breast cancer Paternal Grandmother    Colon cancer Paternal  Grandmother    Breast cancer Cousin        paternal aunt's daughter   Colon cancer Paternal Uncle        3 pat uncles with colon cancer at unknown ages   Pancreatic cancer Maternal Uncle        d. 58   Breast cancer Paternal Aunt      Current Outpatient Medications:    ALPRAZolam (XANAX) 0.5 MG tablet, Take 0.5-1 tablets (0.25-0.5 mg total) by mouth 2 (two) times daily as needed for anxiety., Disp: 20 tablet, Rfl: 0   anastrozole (ARIMIDEX) 1 MG tablet, Take 1 tablet (1 mg total) by mouth daily., Disp: 60 tablet, Rfl: 1   butalbital-acetaminophen-caffeine (FIORICET) 50-325-40 MG tablet, TAKE 1 TABLET BY MOUTH  EVERY 4 TO 6 HOURS AS NEEDED FOR HEADACHE, Disp: 60 tablet, Rfl: 1   Calcium Carbonate-Vit D-Min (CALCIUM 1200 PO), Take 1 capsule by mouth daily., Disp: , Rfl:    diphenoxylate-atropine (LOMOTIL) 2.5-0.025 MG tablet, TAKE 1 TABLET BY MOUTH 4 TIMES A DAY AS NEEDED FOR DIARHEA OR LOOSE STOOLS. TAKE WITH IMMODIUM, Disp: 30 tablet, Rfl: 0   FLUoxetine (PROZAC) 20 MG capsule, TAKE 3 CAPSULES BY MOUTH DAILY, Disp: 270 capsule, Rfl: 1   ibuprofen (ADVIL) 100 MG tablet, Take 100 mg by mouth every 6 (six) hours as needed for fever. , Disp: , Rfl:    levothyroxine (SYNTHROID) 88 MCG tablet, TAKE 1 TABLET BY MOUTH ONCE A DAY, Disp: 90 tablet, Rfl: 1   loperamide (IMODIUM A-D) 2 MG tablet, Take 4 mg by mouth 4 (four) times daily as needed for diarrhea or loose stools. , Disp: , Rfl:    ondansetron (ZOFRAN) 8 MG tablet, One pill every 8 hours as needed for nausea/vomitting., Disp: 40 tablet, Rfl: 1   potassium chloride SA (KLOR-CON) 20 MEQ tablet, TAKE 1 TABLET BY MOUTH TWICE A DAY, Disp: 60 tablet, Rfl: 0  Physical exam:  Vitals:   04/26/21 1348  BP: (!) 102/55  Pulse: 92  Resp: 16  Temp: 97.9 F (36.6 C)  SpO2: 96%  Weight: 144 lb 12.8 oz (65.7 kg)   Physical Exam Constitutional:      General: She is not in acute distress.    Appearance: She is well-developed. She is not  ill-appearing.  HENT:     Head: Atraumatic.     Mouth/Throat:     Pharynx: No oropharyngeal exudate.  Eyes:     General: No scleral icterus. Cardiovascular:     Rate and Rhythm: Normal rate and regular rhythm.  Pulmonary:     Effort: Pulmonary effort is normal.     Breath sounds: Normal breath sounds.  Chest:     Comments: Left breast- 9:00 2-3 CMFN diffuse tenderness and masslike edema effect. No associated adenopathy. No other masses, skin changes, or lesions noted. Inner breast appears swollen Right breast- no palpable nodules, adenopathy, skin changes Abdominal:     General: Bowel sounds are normal. There is no distension.     Palpations: Abdomen is soft.     Tenderness: There is no abdominal tenderness.  Musculoskeletal:        General: No tenderness or deformity. Normal range of motion.     Cervical back: Normal range of motion and neck supple.  Skin:    General: Skin is warm and dry.     Coloration: Skin is not pale.  Neurological:     General: No focal deficit present.     Mental Status: She is alert and oriented to person, place, and time.  Psychiatric:        Mood and Affect: Mood is anxious.        Behavior: Behavior normal.    CMP Latest Ref Rng & Units 03/12/2021  Glucose 70 - 99 mg/dL 142(H)  BUN 6 - 20 mg/dL 10  Creatinine 0.44 - 1.00 mg/dL 1.04(H)  Sodium 135 - 145 mmol/L 136  Potassium 3.5 - 5.1 mmol/L 3.4(L)  Chloride 98 - 111 mmol/L 104  CO2 22 - 32 mmol/L 23  Calcium 8.9 - 10.3 mg/dL 9.2  Total Protein 6.5 - 8.1 g/dL 7.7  Total Bilirubin 0.3 - 1.2 mg/dL 0.4  Alkaline Phos 38 - 126 U/L 145(H)  AST 15 - 41 U/L 24  ALT 0 - 44 U/L 18   CBC Latest Ref Rng & Units 03/12/2021  WBC 4.0 - 10.5 K/uL 7.2  Hemoglobin 12.0 - 15.0 g/dL 13.3  Hematocrit 36.0 - 46.0 % 40.9  Platelets 150 - 400 K/uL 321   No images are attached to the encounter.  No results found.  Assessment and plan- Patient is a 60 y.o. female with history of ER positive left breast  cancer s/p left breast cancer, 2 separate primaries, s/p lumpectomy, 4 cycles of adjuvant chemotherapy with taxotere and cytoxan followed by RT, currently on aromasin, who presents to clinic for new left breast mass. Plan for mammogram and u/s of left breast. May also need to consider MRI of breast.   She is highly anxious about breast mass and possibility of recurrent or new cancer. Will give her prescription for xanax 0.61m-0.5 mg twice a day as needed for anxiety.   Follow up based on results.   Visit Diagnosis 1. Left breast mass    Patient expressed understanding and was in agreement with this plan. She also understands that She can call clinic at any time with any questions, concerns, or complaints.   Thank you for allowing me to participate in the care of this very pleasant patient.   LBeckey Rutter DNP, AGNP-C [facility] [place]

## 2021-04-28 NOTE — Telephone Encounter (Signed)
I spoke with Ander Purpura, NP this morning. She would recommend MR of breast. Will enter order and then I will call the patient with NPs recommendation

## 2021-04-29 ENCOUNTER — Other Ambulatory Visit: Payer: Self-pay | Admitting: *Deleted

## 2021-04-29 DIAGNOSIS — C50912 Malignant neoplasm of unspecified site of left female breast: Secondary | ICD-10-CM

## 2021-04-29 DIAGNOSIS — N63 Unspecified lump in unspecified breast: Secondary | ICD-10-CM

## 2021-04-30 NOTE — Telephone Encounter (Signed)
Dx left mammo/ u/s sch. For 9/21. Will review results once available to determine if MRI needs to be scheduled.

## 2021-05-05 ENCOUNTER — Ambulatory Visit
Admission: RE | Admit: 2021-05-05 | Discharge: 2021-05-05 | Disposition: A | Discharge status: Home / Self Care | Payer: BC Managed Care – PPO | Source: Ambulatory Visit | Attending: Internal Medicine | Admitting: Internal Medicine

## 2021-05-05 ENCOUNTER — Other Ambulatory Visit: Payer: Self-pay | Admitting: Internal Medicine

## 2021-05-05 ENCOUNTER — Other Ambulatory Visit: Payer: Self-pay

## 2021-05-05 DIAGNOSIS — N63 Unspecified lump in unspecified breast: Secondary | ICD-10-CM

## 2021-05-05 DIAGNOSIS — C50912 Malignant neoplasm of unspecified site of left female breast: Secondary | ICD-10-CM | POA: Insufficient documentation

## 2021-05-05 DIAGNOSIS — N6489 Other specified disorders of breast: Secondary | ICD-10-CM

## 2021-05-05 DIAGNOSIS — R922 Inconclusive mammogram: Secondary | ICD-10-CM | POA: Diagnosis not present

## 2021-05-05 DIAGNOSIS — N644 Mastodynia: Secondary | ICD-10-CM | POA: Diagnosis not present

## 2021-05-05 DIAGNOSIS — N6320 Unspecified lump in the left breast, unspecified quadrant: Secondary | ICD-10-CM

## 2021-05-05 DIAGNOSIS — R928 Other abnormal and inconclusive findings on diagnostic imaging of breast: Secondary | ICD-10-CM

## 2021-05-05 NOTE — Telephone Encounter (Signed)
[name]- Dr. B would like you to reach out to the patient.

## 2021-05-05 NOTE — Telephone Encounter (Signed)
Pls review reports. [place]-placed orders for needle bx   IMPRESSION: 1. In the LEFT upper outer breast, there is an indeterminate 6 mm masslike area which is favored to correspond to a focal asymmetry noted mammographically. This may reflect fat necrosis/postsurgical changes but recommend ultrasound-guided biopsy for definitive characterization given patient history. Recommend attention on post marker placement mammogram to assess for mammographic/sonographic correlation. 2. No mammographic or sonographic evidence of malignancy at the sites of painful/palpable concern in the LEFT breast. Postsurgical changes are noted.   RECOMMENDATION: LEFT breast ultrasound-guided biopsy x1   I have discussed the findings and recommendations with the patient. If applicable, a reminder letter will be sent to the patient regarding the next appointment. Patient will be scheduled for biopsy at her earliest convenience by the schedulers and ordering provider will be notified.   BI-RADS CATEGORY  4: Suspicious.

## 2021-05-06 ENCOUNTER — Ambulatory Visit
Admission: EM | Admit: 2021-05-06 | Discharge: 2021-05-06 | Disposition: A | Discharge status: Home / Self Care | Payer: BC Managed Care – PPO | Attending: Emergency Medicine | Admitting: Emergency Medicine

## 2021-05-06 ENCOUNTER — Other Ambulatory Visit: Payer: Self-pay | Admitting: Family Medicine

## 2021-05-06 DIAGNOSIS — R300 Dysuria: Secondary | ICD-10-CM

## 2021-05-06 DIAGNOSIS — N390 Urinary tract infection, site not specified: Secondary | ICD-10-CM

## 2021-05-06 LAB — POCT URINALYSIS DIP (MANUAL ENTRY)
Glucose, UA: 100 mg/dL — AB
Nitrite, UA: POSITIVE — AB
Protein Ur, POC: 100 mg/dL — AB
Spec Grav, UA: 1.025 (ref 1.010–1.025)
Urobilinogen, UA: 2 E.U./dL — AB
pH, UA: 5 (ref 5.0–8.0)

## 2021-05-06 MED ORDER — CEPHALEXIN 500 MG PO CAPS: 500.0000 mg | ORAL_CAPSULE | Freq: Two times a day (BID) | ORAL | 0 refills | Status: AC

## 2021-05-06 MED ORDER — PHENAZOPYRIDINE HCL 200 MG PO TABS: 200.0000 mg | ORAL_TABLET | Freq: Three times a day (TID) | ORAL | 0 refills | Status: AC

## 2021-05-06 NOTE — ED Triage Notes (Signed)
Patient presents to Urgent Care with complaints of possible UTI. Pt c/o of dysuria, increased urinary frequency, and abdominal pressure. Treating symptoms with AZO. Has a hx of UTI.   Denies fever or hematuria.

## 2021-05-06 NOTE — Telephone Encounter (Signed)
Sent. Thanks.   

## 2021-05-06 NOTE — ED Provider Notes (Signed)
Subjective:    Jessica Barnes is a very pleasant 60 y.o. female who presents with concerns for UTI due to dysuria, urinary frequency and lower abdominal pressure.  Patient reports treating her symptoms with AZO. No unilateral back pain, vomiting, fever, vaginal discharge, concern for STD.  Past medical history, past surgical history, current medications reviewed.  Allergies: is allergic to sulfamethoxazole-trimethoprim and codeine.  Review of Systems See HPI   Objective:     Vitals:   05/06/21 1813  BP: 119/69  Pulse: 95  Resp: 18  Temp: 98.2 F (36.8 C)  SpO2: 93%     General: Appears well-developed and well-nourished. No acute distress.  Cardiovascular: Normal rate Pulm/Chest: No respiratory distress Abdominal: No CVAT.  Neurological: Alert and oriented to person, place, and time.  Skin: Skin is warm and dry.  Psychiatric: Normal mood, affect, behavior, and thought content.  GU:  Deferred secondary to self collect specimen  Laboratory:  Orders Placed This Encounter  Procedures   Urine Culture   POCT urinalysis dipstick   Results for orders placed or performed during the hospital encounter of 05/06/21  POCT urinalysis dipstick  Result Value Ref Range   Color, UA yellow yellow   Clarity, UA clear clear   Glucose, UA =100 (A) negative mg/dL   Bilirubin, UA small (A) negative   Ketones, POC UA trace (5) (A) negative mg/dL   Spec Grav, UA 1.025 1.010 - 1.025   Blood, UA small (A) negative   pH, UA 5.0 5.0 - 8.0   Protein Ur, POC =100 (A) negative mg/dL   Urobilinogen, UA 2.0 (A) 0.2 or 1.0 E.U./dL   Nitrite, UA Positive (A) Negative   Leukocytes, UA Large (3+) (A) Negative     Assessment:   1. Acute UTI - Urine Culture; Standing - Urine Culture - cephALEXin (KEFLEX) 500 MG capsule; Take 1 capsule (500 mg total) by mouth 2 (two) times daily for 7 days.  Dispense: 14 capsule; Refill: 0 - phenazopyridine (PYRIDIUM) 200 MG tablet; Take 1 tablet (200  mg total) by mouth 3 (three) times daily for 2 days.  Dispense: 6 tablet; Refill: 0  Plan:   MDM: Patient presents with concerns for UTI due to dysuria, urinary frequency and lower abdominal pressure.  Patient reports treating her symptoms with AZO. No unilateral back pain, vomiting, fever, vaginal discharge, concern for STD.  Urinalysis reveals 100 glucose, small bilirubin, trace ketones, small blood, 100 protein, 2 urobilinogen, positive nitrite and large leukocytes.  Urine culture pending.  Urinalysis results likely skewed somewhat due to use of AZO, but given the patient's symptoms will treat with Keflex twice daily for the next 7 days for presumed acute UTI.  Advised to return with any unilateral back pain, fever or vomiting.  Advised to push fluids.  Patient verbalized understanding and agreed with plan.  Patient stable upon discharge.  Return as needed.    Discharge Instructions      You were seen today for an infection in the lower urinary tract. Your urine sample today was sent for a culture. The urine culture will show what type of bacteria grows and if you are on the appropriate antibiotic. If the antibiotic needs to be changed, you will receive a phone call from the follow up nurse who will give you more information. If you do not receive a call, then you are on the correct antibiotic.  Take antibiotics as directed. Finish course even if feeling better sooner. Drink plenty of clear fluids.  Over the counter "Uristat" or "Azo Standard" may help your discomfort.  This will turn your urine dark orange or red and may stain contacts (remove before using). You may experience 24 - 48 hours of continuing discomfort until medication controls the infection.  Return to clinic or go to the ER if you develop a fever, one-sided back pain, or vomiting as these are signs of a worsening infection.          [name], [place] 05/06/21 1835

## 2021-05-06 NOTE — Discharge Instructions (Signed)

## 2021-05-06 NOTE — Telephone Encounter (Signed)
Refill request for BAC 50-325-40 MG tablet  LOV - 07/02/20 VV Next OV - not scheduled Last refill - 03/21/21 #60/1

## 2021-05-07 ENCOUNTER — Telehealth: Payer: Self-pay | Admitting: Nurse Practitioner

## 2021-05-07 DIAGNOSIS — N63 Unspecified lump in unspecified breast: Secondary | ICD-10-CM

## 2021-05-07 NOTE — Telephone Encounter (Signed)
Jessica Barnes will be scheduling this but [name] is out of the office and should be doing it next week.

## 2021-05-07 NOTE — Telephone Encounter (Signed)
Spoke to patient regarding results of mammogram and ultrasound. Recommend biopsy. Orders placed by Dr. Rogue Bussing. She is recovering from UTI currently and is taking antibiotics per ED.

## 2021-05-08 ENCOUNTER — Encounter: Payer: Self-pay | Admitting: Internal Medicine

## 2021-05-08 NOTE — Progress Notes (Signed)
I spoke to patient husband Shanon Brow regarding results of the mammogram with the possibility of fat necrosis. However await  the biopsy. They are currently awaiting scheduling the biopsy.

## 2021-05-09 LAB — URINE CULTURE: Culture: >=100000 — AB

## 2021-05-11 ENCOUNTER — Other Ambulatory Visit: Payer: Self-pay

## 2021-05-11 ENCOUNTER — Ambulatory Visit
Admission: RE | Admit: 2021-05-11 | Discharge: 2021-05-11 | Disposition: A | Discharge status: Home / Self Care | Payer: BC Managed Care – PPO | Source: Ambulatory Visit | Attending: Internal Medicine | Admitting: Internal Medicine

## 2021-05-11 DIAGNOSIS — N6489 Other specified disorders of breast: Secondary | ICD-10-CM | POA: Diagnosis not present

## 2021-05-11 DIAGNOSIS — R928 Other abnormal and inconclusive findings on diagnostic imaging of breast: Secondary | ICD-10-CM

## 2021-05-11 DIAGNOSIS — N6320 Unspecified lump in the left breast, unspecified quadrant: Secondary | ICD-10-CM | POA: Diagnosis not present

## 2021-05-11 DIAGNOSIS — L905 Scar conditions and fibrosis of skin: Secondary | ICD-10-CM | POA: Diagnosis not present

## 2021-05-11 DIAGNOSIS — N6321 Unspecified lump in the left breast, upper outer quadrant: Secondary | ICD-10-CM | POA: Diagnosis not present

## 2021-05-11 HISTORY — PX: BREAST BIOPSY: SHX20

## 2021-05-12 ENCOUNTER — Encounter: Payer: Self-pay | Admitting: Internal Medicine

## 2021-05-12 ENCOUNTER — Telehealth: Payer: Self-pay | Admitting: *Deleted

## 2021-05-12 LAB — SURGICAL PATHOLOGY

## 2021-05-12 NOTE — Telephone Encounter (Signed)
Jessica Barnes with [facility] called requesting to speak with Dr B regarding this patient biopsy and patient concern that the proper area was not biopsied. Please return her call

## 2021-05-12 NOTE — Telephone Encounter (Signed)
[name]- Dr. B would like you to call radiology. Thanks.

## 2021-05-14 ENCOUNTER — Encounter: Payer: Self-pay | Admitting: Internal Medicine

## 2021-05-14 NOTE — Telephone Encounter (Signed)
Patient needs MRI-breast. Is a PA needed? Colette- pls arrange.

## 2021-05-14 NOTE — Telephone Encounter (Signed)
[name]- Pls review path. Notify patient of test results   SURGICAL PATHOLOGY  CASE: [id number]  PATIENT: Jessica Barnes  Surgical Pathology Report      Specimen Submitted:  A. Breast, left   Clinical History: Coil clip within asymmetry, UOQ left breast, adjacent  to lumpectomy scar.       DIAGNOSIS:  A. LEFT BREAST, 2:00 3CMFN; ULTRASOUND-GUIDED BIOPSY:  - PREDOMINANTLY SKELETAL MUSCLE.  - DENSE FIBROUS TISSUE WITH MINUTE FOCUS OF BENIGN MAMMARY PARENCHYMA.  - NEGATIVE FOR ATYPIA AND MALIGNANCY.

## 2021-05-19 ENCOUNTER — Other Ambulatory Visit: Payer: Self-pay

## 2021-05-19 ENCOUNTER — Ambulatory Visit
Admission: RE | Admit: 2021-05-19 | Discharge: 2021-05-19 | Disposition: A | Discharge status: Home / Self Care | Payer: BC Managed Care – PPO | Source: Ambulatory Visit | Attending: Nurse Practitioner | Admitting: Nurse Practitioner

## 2021-05-19 DIAGNOSIS — N63 Unspecified lump in unspecified breast: Secondary | ICD-10-CM | POA: Insufficient documentation

## 2021-05-19 DIAGNOSIS — N6320 Unspecified lump in the left breast, unspecified quadrant: Secondary | ICD-10-CM | POA: Diagnosis not present

## 2021-05-19 DIAGNOSIS — C50912 Malignant neoplasm of unspecified site of left female breast: Secondary | ICD-10-CM | POA: Insufficient documentation

## 2021-05-19 DIAGNOSIS — Z853 Personal history of malignant neoplasm of breast: Secondary | ICD-10-CM | POA: Diagnosis not present

## 2021-05-19 MED ORDER — GADOBUTROL 1 MMOL/ML IV SOLN
6.0000 mL | Freq: Once | INTRAVENOUS | Status: AC | PRN
  Administered 2021-05-19: 6 mL via INTRAVENOUS

## 2021-05-20 ENCOUNTER — Other Ambulatory Visit: Payer: Self-pay | Admitting: Family Medicine

## 2021-05-21 ENCOUNTER — Ambulatory Visit: Payer: BC Managed Care – PPO

## 2021-05-24 ENCOUNTER — Other Ambulatory Visit: Payer: Self-pay | Admitting: Family Medicine

## 2021-05-24 ENCOUNTER — Telehealth: Payer: Self-pay | Admitting: Internal Medicine

## 2021-05-24 NOTE — Telephone Encounter (Signed)
Dr. B asked lauren to call to go over results

## 2021-05-24 NOTE — Telephone Encounter (Signed)
Dr. Jacinto Reap asked [name] to call to go over results.

## 2021-05-25 ENCOUNTER — Telehealth: Payer: Self-pay | Admitting: Nurse Practitioner

## 2021-05-25 ENCOUNTER — Encounter: Payer: Self-pay | Admitting: Internal Medicine

## 2021-05-25 DIAGNOSIS — C50912 Malignant neoplasm of unspecified site of left female breast: Secondary | ICD-10-CM

## 2021-05-25 NOTE — Telephone Encounter (Signed)
MRI results reviewed with patient. No evidence of mass or malignancy. Patient feels well and is reassured. She will keep follow up with Dr. Rogue Bussing as scheduled.

## 2021-05-25 NOTE — Telephone Encounter (Signed)
[name]- will you reach out to the patient.

## 2021-05-28 NOTE — Telephone Encounter (Signed)
Tried to call patient to see if she is still taking rx; has been discontinued in EMR. LMTCB

## 2021-05-28 NOTE — Telephone Encounter (Signed)
Pt returning call

## 2021-06-03 ENCOUNTER — Other Ambulatory Visit: Payer: Self-pay | Admitting: Nurse Practitioner

## 2021-06-11 ENCOUNTER — Inpatient Hospital Stay: Payer: BC Managed Care – PPO | Attending: Internal Medicine

## 2021-06-11 ENCOUNTER — Encounter: Payer: Self-pay | Admitting: Internal Medicine

## 2021-06-11 ENCOUNTER — Other Ambulatory Visit: Payer: Self-pay

## 2021-06-11 ENCOUNTER — Inpatient Hospital Stay (HOSPITAL_BASED_OUTPATIENT_CLINIC_OR_DEPARTMENT_OTHER): Payer: BC Managed Care – PPO | Admitting: Internal Medicine

## 2021-06-11 DIAGNOSIS — Z170 Estrogen receptor positive status [ER+]: Secondary | ICD-10-CM | POA: Insufficient documentation

## 2021-06-11 DIAGNOSIS — Z800 Family history of malignant neoplasm of digestive organs: Secondary | ICD-10-CM | POA: Insufficient documentation

## 2021-06-11 DIAGNOSIS — Z923 Personal history of irradiation: Secondary | ICD-10-CM | POA: Diagnosis not present

## 2021-06-11 DIAGNOSIS — M2550 Pain in unspecified joint: Secondary | ICD-10-CM | POA: Diagnosis not present

## 2021-06-11 DIAGNOSIS — G8929 Other chronic pain: Secondary | ICD-10-CM | POA: Diagnosis not present

## 2021-06-11 DIAGNOSIS — E039 Hypothyroidism, unspecified: Secondary | ICD-10-CM | POA: Diagnosis not present

## 2021-06-11 DIAGNOSIS — J449 Chronic obstructive pulmonary disease, unspecified: Secondary | ICD-10-CM | POA: Insufficient documentation

## 2021-06-11 DIAGNOSIS — R110 Nausea: Secondary | ICD-10-CM | POA: Insufficient documentation

## 2021-06-11 DIAGNOSIS — C50412 Malignant neoplasm of upper-outer quadrant of left female breast: Secondary | ICD-10-CM | POA: Diagnosis not present

## 2021-06-11 DIAGNOSIS — F419 Anxiety disorder, unspecified: Secondary | ICD-10-CM | POA: Insufficient documentation

## 2021-06-11 DIAGNOSIS — Z803 Family history of malignant neoplasm of breast: Secondary | ICD-10-CM | POA: Diagnosis not present

## 2021-06-11 DIAGNOSIS — R197 Diarrhea, unspecified: Secondary | ICD-10-CM | POA: Diagnosis not present

## 2021-06-11 DIAGNOSIS — K5900 Constipation, unspecified: Secondary | ICD-10-CM | POA: Insufficient documentation

## 2021-06-11 DIAGNOSIS — G4700 Insomnia, unspecified: Secondary | ICD-10-CM | POA: Insufficient documentation

## 2021-06-11 DIAGNOSIS — E876 Hypokalemia: Secondary | ICD-10-CM | POA: Diagnosis not present

## 2021-06-11 DIAGNOSIS — E785 Hyperlipidemia, unspecified: Secondary | ICD-10-CM | POA: Insufficient documentation

## 2021-06-11 DIAGNOSIS — R232 Flushing: Secondary | ICD-10-CM | POA: Insufficient documentation

## 2021-06-11 DIAGNOSIS — Z9221 Personal history of antineoplastic chemotherapy: Secondary | ICD-10-CM | POA: Insufficient documentation

## 2021-06-11 DIAGNOSIS — Z79811 Long term (current) use of aromatase inhibitors: Secondary | ICD-10-CM | POA: Diagnosis not present

## 2021-06-11 LAB — COMPREHENSIVE METABOLIC PANEL
ALT: 35 U/L (ref 0–44)
AST: 33 U/L (ref 15–41)
Albumin: 4.4 g/dL (ref 3.5–5.0)
Alkaline Phosphatase: 152 U/L — ABNORMAL HIGH (ref 38–126)
Anion gap: 9 (ref 5–15)
BUN: 14 mg/dL (ref 8–23)
CO2: 24 mmol/L (ref 22–32)
Calcium: 9.5 mg/dL (ref 8.9–10.3)
Chloride: 101 mmol/L (ref 98–111)
Creatinine, Ser: 0.91 mg/dL (ref 0.44–1.00)
GFR, Estimated: >60 mL/min (ref >60)
Glucose, Bld: 97 mg/dL (ref 70–99)
Potassium: 4.2 mmol/L (ref 3.5–5.1)
Sodium: 134 mmol/L — ABNORMAL LOW (ref 135–145)
Total Bilirubin: 0.3 mg/dL (ref 0.3–1.2)
Total Protein: 8 g/dL (ref 6.5–8.1)

## 2021-06-11 LAB — CBC WITH DIFFERENTIAL/PLATELET
Abs Immature Granulocytes: 0.01 10*3/uL (ref 0.00–0.07)
Basophils Absolute: 0.1 10*3/uL (ref 0.0–0.1)
Basophils Relative: 1 %
Eosinophils Absolute: 0.2 10*3/uL (ref 0.0–0.5)
Eosinophils Relative: 3 %
HCT: 40.1 % (ref 36.0–46.0)
Hemoglobin: 13.1 g/dL (ref 12.0–15.0)
Immature Granulocytes: 0 %
Lymphocytes Relative: 23 %
Lymphs Abs: 1.6 10*3/uL (ref 0.7–4.0)
MCH: 30.9 pg (ref 26.0–34.0)
MCHC: 32.7 g/dL (ref 30.0–36.0)
MCV: 94.6 fL (ref 80.0–100.0)
Monocytes Absolute: 0.6 10*3/uL (ref 0.1–1.0)
Monocytes Relative: 8 %
Neutro Abs: 4.6 10*3/uL (ref 1.7–7.7)
Neutrophils Relative %: 65 %
Platelets: 354 10*3/uL (ref 150–400)
RBC: 4.24 MIL/uL (ref 3.87–5.11)
RDW: 13.2 % (ref 11.5–15.5)
WBC: 7.2 10*3/uL (ref 4.0–10.5)
nRBC: 0 % (ref 0.0–0.2)

## 2021-06-11 LAB — TSH: TSH: 2.496 u[IU]/mL (ref 0.350–4.500)

## 2021-06-11 NOTE — Assessment & Plan Note (Addendum)
#  Left breast cancer-2 separate primaries- A] T2N1-ER/PR positive HER-2 negative;  B] T1N1 ER/PR positive HER-2 negative. STABLE; No evidence of recurrence.  # Currently on Aromasin-tolerating with mild to moderate side effects- fatigue/hot flashes.  Continue Aromasin for now.  OCT 2022-  No MR evidence of breast malignancy; LEFT breast surgical changes.  # Chronic hypokalemia/diarrhea-potassium-3.4; on [place];  # Elevated LFTs-unclear etiology s/p extensive work-up/GI evaluation [2021] alkaline phosphatase-152-overall STABLE;  # BMD- T score= 0.9; continue Vit D [ca-constipation]; STABLE; discused adjuvant Zometa; wants to think/hold off.   # Fatigue/hypothyroidism-improved currently on Synthroid 88 mcg-TSH today 1.7- STABLE.   # Insomnia/likely secondary to anxiety- on valium improved.  Stable; however given the difficulty with getting ongoing prescription of Valium, I recommend evaluation with psychiatry.   # DISPOSITION:  #follow up in 4 months-MD; labs- cbc/cmp;-Dr.B.

## 2021-06-11 NOTE — Progress Notes (Signed)
Patient denies new problems/concerns today.   °

## 2021-06-11 NOTE — Progress Notes (Signed)
[facility] [place] NOTE  Patient Care Team: Tonia Ghent, MD as PCP - General (Family Medicine) Cammie Sickle, MD as Consulting Physician (Internal Medicine) Rolm Bookbinder, MD as Consulting Physician (General Surgery) Rico Junker, [name]  #  Oncology History Overview Note  # DEC 2020- LEFT BREAST-TWO PRIM ARIES:   #A]-IMC; ER/PR >90%; Her 2-IHC-2+; FISH-NEG-2 ; MRI- 3.7 CM; s/p s/p lumpectomy; s/p reexcision-final margins negative.  Stage Ib.    #B] Bx- IMC; G-1; ER/PR-pos; her2 NEG. s/p lumpectomy; margins negative [as per Dr.Wakefield; discussed at [facility] tumor conference]  # MARCH 31st 2021- START Taxotere [60 mg/m]; Cytoxan; growth factor x4 cycles; s/p radiation 04/29/2020  # MID-OCT 2021- Start anastrozole; MID MARCH 2022- stopped sec to intolerance; MID April 20222- START AROMASIN  #Extreme anxiety/chronic diarrhea/chronic severe hypokalemia/history of migraines/depression  Used estrogen and progesterone therapy: on premarin for post- menopausal x 2-3 years- discontinued in JAN 2021.   # SURVIVORSHIP:   DIAGNOSIS: Breast cancer  STAGE: IB & I     ;  GOALS: Cure  CURRENT/MOST RECENT THERAPY : Anastrozole    Carcinoma of upper-outer quadrant of left breast in female, estrogen receptor positive ([place])  08/14/2019 Initial Diagnosis   Carcinoma of upper-outer quadrant of left breast in female, estrogen receptor positive ([place])   09/10/2019 Genetic Testing   Negative genetic testing on the STAT and common hereditary cancer panel.  The Common Hereditary Gene Panel offered by [facility] includes sequencing and/or deletion duplication testing of the following 48 genes: APC, ATM, AXIN2, BARD1, BMPR1A, BRCA1, BRCA2, BRIP1, CDH1, CDK4, CDKN2A (p14ARF), CDKN2A (p16INK4a), CHEK2, CTNNA1, DICER1, EPCAM (Deletion/duplication testing only), GREM1 (promoter region  deletion/duplication testing only), KIT, MEN1, MLH1, MSH2, MSH3, MSH6, MUTYH, NBN, NF1, NHTL1, PALB2, PDGFRA, PMS2, POLD1, POLE, PTEN, RAD50, RAD51C, RAD51D, RNF43, SDHB, SDHC, SDHD, SMAD4, SMARCA4. STK11, TP53, TSC1, TSC2, and VHL.  The following genes were evaluated for sequence changes only: SDHA and HOXB13 c.251G>A variant only. The report date is September 10, 2019.   10/03/2019 Cancer Staging   Staging form: Breast, AJCC 8th Edition - Pathologic stage from 10/03/2019: Stage IB (pT2, pN1a, cM0, G2, ER+, PR+, HER2-) - Signed by Gardenia Phlegm, NP on 10/16/2019    10/09/2019 - 10/09/2019 Chemotherapy   The patient had dexamethasone (DECADRON) 4 MG tablet, 0 of 1 cycle, Start date: --, End date: -- DOXOrubicin (ADRIAMYCIN) chemo injection 98 mg, 60 mg/m2, Intravenous,  Once, 0 of 4 cycles PALONOSETRON HCL INJECTION 0.25 MG/5ML, 0.25 mg, Intravenous,  Once, 0 of 4 cycles pegfilgrastim-jmdb (FULPHILA) injection 6 mg, 6 mg, Subcutaneous,  Once, 0 of 4 cycles cyclophosphamide (CYTOXAN) 980 mg in sodium chloride 0.9 % 250 mL chemo infusion, 600 mg/m2, Intravenous,  Once, 0 of 4 cycles PACLitaxel (TAXOL) 132 mg in sodium chloride 0.9 % 250 mL chemo infusion (</= 47m/m2), 80 mg/m2, Intravenous,  Once, 0 of 12 cycles FOSAPREPITANT IV INFUSION 150 MG, 150 mg, Intravenous,  Once, 0 of 4 cycles   for chemotherapy treatment.     11/13/2019 -  Chemotherapy   The patient had dexamethasone (DECADRON) 4 MG tablet, 8 mg, Oral, 2 times daily, 1 of 1 cycle, Start date: --, End date: -- palonosetron (ALOXI) injection 0.25 mg, 0.25 mg, Intravenous,  Once, 4 of 4 cycles Administration: 0.25 mg (11/13/2019), 0.25 mg (12/04/2019), 0.25 mg (12/25/2019), 0.25 mg (01/15/2020) pegfilgrastim-jmdb (FULPHILA) injection 6 mg, 6 mg, Subcutaneous,  Once, 4 of 4 cycles  Administration: 6 mg (11/14/2019), 6 mg (12/05/2019), 6 mg (12/26/2019), 6 mg (01/16/2020) cyclophosphamide (CYTOXAN) 1,000 mg in sodium chloride 0.9 % 250 mL chemo  infusion, 980 mg, Intravenous,  Once, 4 of 4 cycles Administration: 1,000 mg (11/13/2019), 1,000 mg (12/04/2019), 1,000 mg (12/25/2019), 1,000 mg (01/15/2020) DOCEtaxel (TAXOTERE) 100 mg in sodium chloride 0.9 % 250 mL chemo infusion, 60 mg/m2 = 100 mg (100 % of original dose 60 mg/m2), Intravenous,  Once, 4 of 4 cycles Dose modification: 60 mg/m2 (original dose 60 mg/m2, Cycle 1, Reason: Provider Judgment) Administration: 100 mg (11/13/2019), 100 mg (12/04/2019), 100 mg (12/25/2019), 100 mg (01/15/2020)   for chemotherapy treatment.        HISTORY OF PRESENTING ILLNESS:  Jessica Barnes 60 y.o.  female with of breast cancer  [T2N1-ER/PR positive HER-2 negative; T1N1-ER/PR positive HER-2 negative 2 separate primaries] s/p adjuvant chemotherapy; also s/p adjuvant radiation currently on Aromasin is here for follow-up.  In the interim patient had a mammogram/breast MRI-given concerns of the palpable abnormality.  No evidence of any concerns or recurrence.  Denies any worsening hot flashes.  Denies any worsening fatigue.  Chronic joint pains not any worse.  No chest pain or shortness of breath or cough.  Admits to compliance with Aromasin.  Review of Systems  Constitutional:  Positive for malaise/fatigue. Negative for chills, diaphoresis, fever and weight loss.  HENT:  Negative for nosebleeds and sore throat.   Eyes:  Negative for double vision.  Respiratory:  Negative for cough, hemoptysis, sputum production and wheezing.   Cardiovascular:  Negative for chest pain, palpitations, orthopnea and leg swelling.  Gastrointestinal:  Negative for abdominal pain, blood in stool, constipation, heartburn, melena, nausea and vomiting.  Genitourinary:  Negative for dysuria, frequency and urgency.  Musculoskeletal:  Negative for back pain and joint pain.  Neurological:  Negative for dizziness, tingling, focal weakness and weakness.  Endo/Heme/Allergies:  Does not bruise/bleed easily.   Psychiatric/Behavioral:  The patient is nervous/anxious and has insomnia.     MEDICAL HISTORY:  Past Medical History:  Diagnosis Date   Anxiety 10/13/96   Chest pain    atypical, hosp for that and depression 8/10-8/11/08   COPD (chronic obstructive pulmonary disease) ([place]) 09/15/05   by x-ray   Depression 10/13/96   hospital 8/10-8/11/08.  notable history: son with TBI after MVA   Family history of breast cancer    Family history of colon cancer    Family history of pancreatic cancer    Hyperlipidemia 09/15/98   Hypothyroidism 01/14/96   Migraine    39 yoa   NSVD (normal spontaneous vaginal delivery)    x 1   Personal history of chemotherapy    finished 01/2020   Personal history of radiation therapy    finished 04/2020   PONV (postoperative nausea and vomiting)    PTSD (post-traumatic stress disorder)    likely PTSD after son's MVA/TBI in 2007, s/p counseling    SURGICAL HISTORY: Past Surgical History:  Procedure Laterality Date   APPENDECTOMY     60 years of age   BREAST BIOPSY Left 08/06/2019   [place] bx/ ribbon clip/ invasive mammary carcinoma   BREAST BIOPSY Left 09/06/2019   [place] bx, heart clip, invasive mammary carcinoma    BREAST BIOPSY Left 05/11/2021   u/s biopsy/ path pending   BREAST LUMPECTOMY Left 10/21/2019   Inv ductal Ca and DCIS    BREAST LUMPECTOMY WITH RADIOACTIVE SEED AND SENTINEL LYMPH NODE BIOPSY Left 10/01/2019   Procedure: LEFT BREAST  LUMPECTOMY WITH BRACKETED RADIOACTIVE SEEDS AND LEFT AXILLARY SENTINEL LYMPH NODE BIOPSY;  Surgeon: Rolm Bookbinder, MD;  Location: [facility];  Service: General;  Laterality: Left;   PORTACATH PLACEMENT Right 10/28/2019   Procedure: INSERTION PORT-A-CATH WITH ULTRASOUND GUIDANCE;  Surgeon: Rolm Bookbinder, MD;  Location: [facility];  Service: General;  Laterality: Right;   RE-EXCISION OF BREAST LUMPECTOMY Left 10/28/2019   Procedure: RE-EXCISION OF LEFT BREAST LUMPECTOMY;  Surgeon:  Rolm Bookbinder, MD;  Location: [facility];  Service: General;  Laterality: Left;    SOCIAL HISTORY: Social History   Socioeconomic History   Marital status: Married    Spouse name: Not on file   Number of children: Not on file   Years of education: Not on file   Highest education level: Not on file  Occupational History   Not on file  Tobacco Use   Smoking status: Former    Packs/day: 0.50    Years: 8.00    Pack years: 4.00    Types: Cigarettes   Smokeless tobacco: Never   Tobacco comments:    quit 04/15/2014  Vaping Use   Vaping Use: Every day  Substance and Sexual Activity   Alcohol use: No    Alcohol/week: 0.0 standard drinks   Drug use: No   Sexual activity: Not on file  Other Topics Concern   Not on file  Social History Narrative   Remarried, lives with husband 1 son, he had TBI and short term memory loss after prolonged illness      #Patient lives in [place] with her husband; prior history of smoking/currently vapes.  No significant alcohol use.  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    Social Determinants of Health   Financial Resource Strain: Not on file  Food Insecurity: Not on file  Transportation Needs: Not on file  Physical Activity: Not on file  Stress: Not on file  Social Connections: Not on file  Intimate Partner Violence: Not on file    FAMILY HISTORY: Family History  Problem Relation Age of Onset   Hypertension Mother    Colon cancer Father        d. 54   Thyroid disease Sister    Breast cancer Paternal Aunt    Breast cancer Paternal Grandmother    Colon cancer Paternal Grandmother    Breast cancer Cousin        paternal aunt's daughter   Colon cancer Paternal Uncle        3 pat uncles with colon cancer at unknown ages   Pancreatic cancer Maternal Uncle        d. 30   Breast cancer Paternal Aunt     ALLERGIES:  is allergic to sulfamethoxazole-trimethoprim and codeine.  MEDICATIONS:  Current Outpatient  Medications  Medication Sig Dispense Refill   ALPRAZolam (XANAX) 0.5 MG tablet Take 0.5-1 tablets (0.25-0.5 mg total) by mouth 2 (two) times daily as needed for anxiety. 20 tablet 0   anastrozole (ARIMIDEX) 1 MG tablet TAKE 1 TABLET BY MOUTH ONCE A DAY 60 tablet 2   butalbital-acetaminophen-caffeine (BAC) 50-325-40 MG tablet TAKE 1 TABLET BY MOUTH EVERY 4 TO 6 HOURS AS NEEDED FOR HEADACHE 60 tablet 1   Calcium Carbonate-Vit D-Min (CALCIUM 1200 PO) Take 1 capsule by mouth daily.     diphenoxylate-atropine (LOMOTIL) 2.5-0.025 MG tablet TAKE 1 TABLET BY MOUTH 4 TIMES A DAY AS NEEDED FOR DIARHEA OR LOOSE STOOLS. TAKE WITH IMMODIUM 30 tablet 0   FLUoxetine (  PROZAC) 20 MG capsule TAKE 3 CAPSULES BY MOUTH DAILY 270 capsule 1   ibuprofen (ADVIL) 100 MG tablet Take 100 mg by mouth every 6 (six) hours as needed for fever.      levothyroxine (SYNTHROID) 88 MCG tablet TAKE 1 TABLET BY MOUTH ONCE A DAY 90 tablet 1   loperamide (IMODIUM A-D) 2 MG tablet Take 4 mg by mouth 4 (four) times daily as needed for diarrhea or loose stools.      ondansetron (ZOFRAN) 8 MG tablet One pill every 8 hours as needed for nausea/vomitting. 40 tablet 1   potassium chloride SA (KLOR-CON) 20 MEQ tablet TAKE 1 TABLET BY MOUTH TWICE A DAY 60 tablet 0   No current facility-administered medications for this visit.      Marland Kitchen  PHYSICAL EXAMINATION: ECOG PERFORMANCE STATUS: 0 - Asymptomatic  Vitals:   06/11/21 1512  BP: 121/78  Pulse: 82  Resp: 16  Temp: 98.7 F (37.1 C)   Filed Weights   06/11/21 1512  Weight: 143 lb (64.9 kg)    Physical Exam Constitutional:      Comments: Accompanied by her sister.  Ambulating independently.  HENT:     Head: Normocephalic and atraumatic.     Mouth/Throat:     Pharynx: No oropharyngeal exudate.  Eyes:     Pupils: Pupils are equal, round, and reactive to light.  Cardiovascular:     Rate and Rhythm: Normal rate and regular rhythm.  Pulmonary:     Effort: Pulmonary effort is  normal. No respiratory distress.     Breath sounds: Normal breath sounds. No wheezing.  Abdominal:     General: Bowel sounds are normal. There is no distension.     Palpations: Abdomen is soft. There is no mass.     Tenderness: There is no abdominal tenderness. There is no guarding or rebound.  Musculoskeletal:        General: No tenderness. Normal range of motion.     Cervical back: Normal range of motion and neck supple.  Skin:    General: Skin is warm.     Comments: Mild left underarm swelling/chest wall swelling.  Neurological:     Mental Status: She is alert and oriented to person, place, and time.  Psychiatric:        Mood and Affect: Affect normal.     Comments: Anxious   LABORATORY DATA:  I have reviewed the data as listed Lab Results  Component Value Date   WBC 7.2 06/11/2021   HGB 13.1 06/11/2021   HCT 40.1 06/11/2021   MCV 94.6 06/11/2021   PLT 354 06/11/2021   Recent Labs    01/18/21 1359 03/12/21 1502 06/11/21 1454  NA 136 136 134*  K 3.7 3.4* 4.2  CL 101 104 101  CO2 _0 GLUCOSE 122* 142* 97  BUN _1 CREATININE 0.72 1.04* 0.91  CALCIUM 8.9 9.2 9.5  GFRNONAA >60 >60 >60  PROT 7.7 7.7 8.0  ALBUMIN 4.0 4.2 4.4  AST 27 24 33  ALT 24 18 35  ALKPHOS 123 145* 152*  BILITOT 0.4 0.4 0.3    RADIOGRAPHIC STUDIES: I have personally reviewed the radiological images as listed and agreed with the findings in the report. MR BREAST BILATERAL W WO CONTRAST INC CAD  Result Date: 05/19/2021 CLINICAL DATA:  60 year old female with palpable thickening/lump in the INNER LEFT breast. Patient has a history of LEFT breast cancer and lumpectomy in 2021. Benign biopsy of the  UPPER-OUTER LEFT breast on 05/11/2021. LABS:  None performed today EXAM: BILATERAL BREAST MRI WITH AND WITHOUT CONTRAST TECHNIQUE: Multiplanar, multisequence MR images of both breasts were obtained prior to and following the intravenous administration of 6 ml of Gadavist Three-dimensional MR  images were rendered by post-processing of the original MR data on an independent workstation. The three-dimensional MR images were interpreted, and findings are reported in the following complete MRI report for this study. Three dimensional images were evaluated at the independent interpreting workstation using the DynaCAD thin client. COMPARISON:  08/30/2019 MR. Prior mammograms and ultrasounds. FINDINGS: Breast composition: b. Scattered fibroglandular tissue. Background parenchymal enhancement: Minimal Right breast: No mass or abnormal enhancement. Left breast: No mass or abnormal enhancement. Lumpectomy changes within the LEFT breast are identified. Lymph nodes: No abnormal appearing lymph nodes. Ancillary findings:  None. IMPRESSION: 1. No MR evidence of breast malignancy. 2. LEFT breast surgical changes. RECOMMENDATION: Bilateral diagnostic mammogram in 1 year to resume annual mammogram schedule. Consider clinical follow-up as indicated. Any further workup should be based on clinical grounds. BI-RADS CATEGORY  2: Benign. Electronically Signed   By: Margarette Canada M.D.   On: 05/19/2021 12:50   ASSESSMENT & PLAN:   Carcinoma of upper-outer quadrant of left breast in female, estrogen receptor positive ([place]) # Left breast cancer-2 separate primaries- A] T2N1-ER/PR positive HER-2 negative;  B] T1N1 ER/PR positive HER-2 negative. S/p 4 cycles of adjuvant chemotherapy Taxotere [60 mg/m] Cytoxan. S/p  RT [last 9/15].  STABLE; No evidence of recurrence.  Discuss adj. Zometa.   # Currently on Aromasin-tolerating with mild to moderate side effects- fatigue/hot flashes.  Continue Aromasin for now.  # OCT 2022-  No MR evidence of breast malignancy; LEFT breast surgical changes.  # Chronic hypokalemia/diarrhea-potassium-3.4; on [place];  # Swelling under the left underarm/chest wall-likely lymphedema-reiterated the importance of lymphedema physical therapy.  Patient reluctantly agrees.  She states that she  will call.  # Chronic intermittent nausea- 1-2 a month- improved on Zofran.STABLE;  # Elevated LFTs-unclear etiology s/p extensive work-up/GI evaluation [2021] alkaline phosphatase-134; STABLE;  # BMD- T score= 0.9; continue Vit D [ca-constipation]; STABLE; discused adjuvant Zometa; wants to think/hold off .   # Fatigue/hypothyroidism-improved currently on Synthroid 88 mcg-TSH today 1.7 stable.   # Insomnia/likely secondary to anxiety- on valium improved.  Stable; recommend pshcye m   # DISPOSITION:  #follow up in 4 months-MD; labs- cbc/cmp;-Dr.B.  All questions were answered. The patient/family knows to call the clinic with any problems, questions or concerns.    Cammie Sickle, MD 06/17/2021 1:16 PM

## 2021-06-14 ENCOUNTER — Telehealth: Payer: Self-pay | Admitting: *Deleted

## 2021-06-14 NOTE — Telephone Encounter (Signed)
Left detailed vm for patient with md instructions to continue taking the same dose of synthroid.

## 2021-06-14 NOTE — Telephone Encounter (Signed)
-----   Message from Cammie Sickle, MD sent at 06/12/2021  9:28 AM EDT ----- Please inform pt that her thyroid labs are normal. Continue current dose of synthroid. GB

## 2021-06-17 ENCOUNTER — Encounter: Payer: Self-pay | Admitting: Internal Medicine

## 2021-06-25 ENCOUNTER — Other Ambulatory Visit: Payer: Self-pay | Admitting: Family Medicine

## 2021-06-28 NOTE — Telephone Encounter (Signed)
Refill request for Valium 10 mg tablet  LOV - 07/02/20 Next OV - not scheduled Last refill - 03/28/21 #30/0  Patient also needs prozac 20 mg caps refilled

## 2021-06-29 NOTE — Telephone Encounter (Signed)
Pt called in wants to know status of RX refills [phone]

## 2021-06-30 NOTE — Telephone Encounter (Signed)
Sent. Med list updated.  She has f/u schedule for tomorrow. Thanks.

## 2021-07-01 ENCOUNTER — Encounter: Payer: Self-pay | Admitting: Family Medicine

## 2021-07-01 ENCOUNTER — Ambulatory Visit (INDEPENDENT_AMBULATORY_CARE_PROVIDER_SITE_OTHER): Payer: BC Managed Care – PPO | Admitting: Family Medicine

## 2021-07-01 ENCOUNTER — Other Ambulatory Visit: Payer: Self-pay

## 2021-07-01 DIAGNOSIS — Z170 Estrogen receptor positive status [ER+]: Secondary | ICD-10-CM | POA: Diagnosis not present

## 2021-07-01 DIAGNOSIS — C50412 Malignant neoplasm of upper-outer quadrant of left female breast: Secondary | ICD-10-CM

## 2021-07-01 DIAGNOSIS — R519 Headache, unspecified: Secondary | ICD-10-CM | POA: Diagnosis not present

## 2021-07-01 DIAGNOSIS — F32A Depression, unspecified: Secondary | ICD-10-CM

## 2021-07-01 MED ORDER — BUTALBITAL-APAP-CAFFEINE 50-325-40 MG PO TABS: ORAL_TABLET | ORAL | 2 refills | Status: DC

## 2021-07-01 NOTE — Progress Notes (Signed)
Interactive audio and video telecommunications were attempted between this provider and patient, however failed, due to patient having technical difficulties OR patient did not have access to video capability.  We continued and completed visit with audio only.   Virtual Visit via Telephone Note  I connected with patient on 07/01/21  at 3:06 PM  by telephone and verified that I am speaking with the correct person using two identifiers.  Location of patient: home   Location of MD: [place] Name of referring provider (if blank then none associated): Names per persons and role in encounter:  MD: Earlyne Iba, Patient: name listed above.    I discussed the limitations, risks, security and privacy concerns of performing an evaluation and management service by telephone and the availability of in person appointments. I also discussed with the patient that there may be a patient responsible charge related to this service. The patient expressed understanding and agreed to proceed.  CC: follow up.   History of Present Illness:   She is trying to put up with hot flashes with arimidex.  She has noted elevated pulse rate episodically along with fatigue.  Not sleeping well with arimidex, unless taking valium at night.  She didn't feel skipped beats but noted elevated pulse.  No syncope.  She noted dry eyes recently.  She stopped arimidex in the meantime, about 10 days ago.  Her heart rate improved in the meantime but other sx didn't resolve.  No chest pain.  I told her I would update Dr. Rogue Bussing.  I advised her to stay off arimidex for now.    She has had more HA since stopping chemo. She is still taking PRN BAC.    Still on prozac at baseline with PRN BZD qhs.  She thought that SSRI helped.    Recent TSH wnl.    We talked about colon cancer screening but we need to address the heart rate and arimidex first.     Observations/Objective: Nad Speech wnl  Assessment and Plan:  Hold  arimidex for now.  Sx started after last OV with Dr. Jacinto Reap.     Follow Up Instructions: see above.     I discussed the assessment and treatment plan with the patient. The patient was provided an opportunity to ask questions and all were answered. The patient agreed with the plan and demonstrated an understanding of the instructions.   The patient was advised to call back or seek an in-person evaluation if the symptoms worsen or if the condition fails to improve as anticipated.  I provided 27 minutes of non-face-to-face time during this encounter.  [name], MD

## 2021-07-04 NOTE — Assessment & Plan Note (Signed)
She has had more frequent headaches since she stopped chemotherapy.  I change the quantity on her BAC.  She will use it as needed and update me as needed.

## 2021-07-04 NOTE — Assessment & Plan Note (Signed)
Recently noted persistent elevated pulse rate that improved with stopping Arimidex.  I told her I would update oncology about this.  I need oncology input.  I think it makes sense to hold Arimidex in the meantime.  She is not having chest pain and she is okay for outpatient follow-up.  Since this is a video visit we were not able to do an EKG at the time.  Since she is not having symptoms at the time of the visit an EKG would likely not be high yield.  She had no indication for emergency room evaluation at the time of the video visit.

## 2021-07-04 NOTE — Assessment & Plan Note (Signed)
She thought her mood was still better using Prozac at baseline with as needed benzodiazepine.  I would continue both as is.  She will update me as needed.

## 2021-07-06 ENCOUNTER — Telehealth: Payer: Self-pay | Admitting: Family Medicine

## 2021-07-06 ENCOUNTER — Encounter: Payer: Self-pay | Admitting: Internal Medicine

## 2021-07-06 NOTE — Progress Notes (Signed)
Spoke to patient/husband. Patience palpitations improved after coming OFF AI. Recommend holding endocrine therapy for now/until next visit.  Patient complains of left chest wall pain suspect MSK. Recommend Tylenol if not improved call us for evaluation sooner.  C- Please schedule appt in second week of January MD lab to [facility] thyroid profile. Cancel February appointment. Secure chat sent.

## 2021-07-06 NOTE — Telephone Encounter (Signed)
Spoke with patient about getting EKG done; will need to be in an OV. Patient is scheduled for 07/15/21 at 3:00 pm.

## 2021-07-06 NOTE — Telephone Encounter (Signed)
Noted. Thanks.

## 2021-07-06 NOTE — Telephone Encounter (Signed)
Please check with patient about getting an EKG done when possible.  I don't know if this can be done at nurse visit.  If not, then needs OV.  I talked with Dr. Rogue Bussing about this.  I can order if needed- dx tachycardia- let me know if I need to put in the order.    Please let me know when she is coming in so I can update Dr. Rogue Bussing.  Thanks.

## 2021-07-15 ENCOUNTER — Encounter: Payer: Self-pay | Admitting: Family Medicine

## 2021-07-15 ENCOUNTER — Other Ambulatory Visit: Payer: Self-pay

## 2021-07-15 ENCOUNTER — Ambulatory Visit (INDEPENDENT_AMBULATORY_CARE_PROVIDER_SITE_OTHER): Payer: BC Managed Care – PPO | Admitting: Family Medicine

## 2021-07-15 ENCOUNTER — Ambulatory Visit (INDEPENDENT_AMBULATORY_CARE_PROVIDER_SITE_OTHER)
Admission: RE | Admit: 2021-07-15 | Discharge: 2021-07-15 | Disposition: A | Discharge status: Home / Self Care | Payer: BC Managed Care – PPO | Source: Ambulatory Visit | Attending: Family Medicine | Admitting: Family Medicine

## 2021-07-15 VITALS — BP 130/68 | HR 98 | Temp 98.2°F | Ht 63.0 in | Wt 146.0 lb

## 2021-07-15 DIAGNOSIS — R000 Tachycardia, unspecified: Secondary | ICD-10-CM | POA: Diagnosis not present

## 2021-07-15 DIAGNOSIS — R0789 Other chest pain: Secondary | ICD-10-CM

## 2021-07-15 NOTE — Patient Instructions (Signed)
Go to the lab on the way out.   If you have mychart we'll likely use that to update you.    Take care.  Glad to see you. I'll update Dr. Jacinto Reap.

## 2021-07-15 NOTE — Progress Notes (Signed)
This visit occurred during the SARS-CoV-2 public health emergency.  Safety protocols were in place, including screening questions prior to the visit, additional usage of staff PPE, and extensive cleaning of exam room while observing appropriate contact time as indicated for disinfecting solutions.  D/w pt about tachycardia hx and EKG from today.  NSR now.  She feels better off arimidex, felt better after about 1 week off the medicine.  No return of tachycardia in the meantime.  She doesn't have hot flashes now.  Not SOB.    Pain along lower ribs on the L side, ttp, episodic, mid axillary line and anterior.  No bruising.  No pain with deep breath.  No rash.  Going on for a few weeks.  No specific trauma.  No fevers, no chills, no sputum.  Advil helps some, used prn.  Pain is clearly reproducible.  She has less pain with provoking movements when she reinforces the area.    Meds, vitals, and allergies reviewed.   ROS: Per HPI unless specifically indicated in ROS section   GEN: nad, alert and oriented HEENT: ncat NECK: supple w/o LA CV: rrr.  PULM: ctab, no inc wob ABD: soft, +bs EXT: no edema SKIN: well perfused.   L lower ribs ttp.

## 2021-07-18 DIAGNOSIS — R0789 Other chest pain: Secondary | ICD-10-CM | POA: Insufficient documentation

## 2021-07-18 DIAGNOSIS — R000 Tachycardia, unspecified: Secondary | ICD-10-CM | POA: Insufficient documentation

## 2021-07-18 NOTE — Assessment & Plan Note (Signed)
EKG without acute changes.  She feels better off Arimidex.  I will update oncology about her situation.  I think for now it makes sense to continue off Arimidex but I need oncology input.

## 2021-07-18 NOTE — Assessment & Plan Note (Signed)
Likely a benign and self-limited chest wall issue and I do not suspect ominous intrathoracic pathology but reasonable to check chest x-ray today.  See notes on imaging.

## 2021-07-21 ENCOUNTER — Other Ambulatory Visit: Payer: Self-pay | Admitting: Family Medicine

## 2021-07-21 MED ORDER — TIZANIDINE HCL 4 MG PO TABS: 4.0000 mg | ORAL_TABLET | Freq: Four times a day (QID) | ORAL | 0 refills | Status: DC | PRN

## 2021-07-23 ENCOUNTER — Ambulatory Visit: Payer: BC Managed Care – PPO | Admitting: Nurse Practitioner

## 2021-07-29 ENCOUNTER — Other Ambulatory Visit: Payer: Self-pay | Admitting: Internal Medicine

## 2021-07-29 NOTE — Telephone Encounter (Signed)
°  Component Ref Range & Units 1 mo ago  (06/11/21) 4 mo ago  (03/12/21) 6 mo ago  (01/18/21) 9 mo ago  (10/26/20) 1 yr ago  (07/27/20) 1 yr ago  (06/29/20) 1 yr ago  (04/28/20)  Potassium 3.5 - 5.1 mmol/L 4.2  3.4 Low   3.7

## 2021-08-03 ENCOUNTER — Ambulatory Visit
Admission: EM | Admit: 2021-08-03 | Discharge: 2021-08-03 | Disposition: A | Discharge status: Home / Self Care | Payer: BC Managed Care – PPO | Attending: Family Medicine | Admitting: Family Medicine

## 2021-08-03 ENCOUNTER — Encounter: Payer: Self-pay | Admitting: Emergency Medicine

## 2021-08-03 ENCOUNTER — Other Ambulatory Visit: Payer: Self-pay

## 2021-08-03 DIAGNOSIS — R300 Dysuria: Secondary | ICD-10-CM | POA: Insufficient documentation

## 2021-08-03 DIAGNOSIS — N3000 Acute cystitis without hematuria: Secondary | ICD-10-CM | POA: Insufficient documentation

## 2021-08-03 LAB — POCT URINALYSIS DIP (MANUAL ENTRY)
Glucose, UA: 250 mg/dL — AB
Nitrite, UA: POSITIVE — AB
Protein Ur, POC: >=300 mg/dL — AB
Spec Grav, UA: 1.025 (ref 1.010–1.025)
Urobilinogen, UA: 4 E.U./dL — AB
pH, UA: 5 (ref 5.0–8.0)

## 2021-08-03 MED ORDER — CEPHALEXIN 500 MG PO CAPS: 500.0000 mg | ORAL_CAPSULE | Freq: Two times a day (BID) | ORAL | 0 refills | Status: AC

## 2021-08-03 MED ORDER — PHENAZOPYRIDINE HCL 200 MG PO TABS: 200.0000 mg | ORAL_TABLET | Freq: Three times a day (TID) | ORAL | 0 refills | Status: DC | PRN

## 2021-08-03 NOTE — ED Triage Notes (Signed)
Pt c/o dysuria x 2 days
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
## 2021-08-03 NOTE — Discharge Instructions (Signed)
Hydrate well with fluids.  Complete entire course of antibiotics. If you develop any fever, vomiting, or any severe abdominal or back pain go immediately to the emergency department.

## 2021-08-04 LAB — URINE CULTURE
Culture: NO GROWTH
Special Requests: NORMAL

## 2021-08-11 ENCOUNTER — Other Ambulatory Visit: Payer: Self-pay | Admitting: Family Medicine

## 2021-08-11 NOTE — Telephone Encounter (Signed)
Sent. Thanks.   

## 2021-08-11 NOTE — Telephone Encounter (Signed)
Last office visit 07/15/2021 for Tachicardia & Chest Wall Pain.  Last refilled 06/30/2021 for #30 with no refills.  No future appointments with PCP.

## 2021-08-27 ENCOUNTER — Inpatient Hospital Stay: Payer: BC Managed Care – PPO

## 2021-08-27 ENCOUNTER — Inpatient Hospital Stay: Payer: BC Managed Care – PPO | Admitting: Internal Medicine

## 2021-08-27 ENCOUNTER — Telehealth: Payer: Self-pay | Admitting: Internal Medicine

## 2021-08-27 NOTE — Telephone Encounter (Signed)
Patient left message to cancel appointment scheduled on 1/13 @245   and requests call back to reschedule. Routing to scheduler for follow up.

## 2021-08-27 NOTE — Telephone Encounter (Signed)
Yes I have talked to pt like they must leave 5 thousand messages with all the numbers LOL

## 2021-08-27 NOTE — Telephone Encounter (Signed)
Pt called to reschedule her appt for today. Call back at [phone]

## 2021-09-07 ENCOUNTER — Telehealth: Payer: Self-pay | Admitting: Family Medicine

## 2021-09-07 NOTE — Telephone Encounter (Signed)
I do not see anything in the chart. Patient states she is not sure who called, call came through after hours like 6 pm and voicemail was left for patient stating to call dr Josefine Class office back. She is not sure of anything else in the message. I advised patient that I will send this to Dr Damita Dunnings and Janett Billow in case they had something about this that was not in the chart.

## 2021-09-07 NOTE — Telephone Encounter (Signed)
I don't recall any issue here that would have led to the call but I will ask for input from [place] on this.  Thanks.

## 2021-09-07 NOTE — Telephone Encounter (Signed)
Patient is returning call to our office, states she was left a message yesterday around 6pm to return a call to Dr. Josefine Class office  Patient unsure what the call was about, quickly searched chart and was unable to locate any outstanding results or messages  Please follow-up with the patient

## 2021-09-08 NOTE — Telephone Encounter (Signed)
I did not call patient nor do I know who did. There is nothing in the chart from our office.

## 2021-09-08 NOTE — Telephone Encounter (Signed)
Called and spoke with patients husband about this and advised him that I do not see anywhere in the chart where someone called her or why they would have called her.

## 2021-09-10 ENCOUNTER — Other Ambulatory Visit: Payer: BC Managed Care – PPO

## 2021-09-10 ENCOUNTER — Ambulatory Visit: Payer: BC Managed Care – PPO | Admitting: Internal Medicine

## 2021-09-14 ENCOUNTER — Inpatient Hospital Stay: Payer: BC Managed Care – PPO | Admitting: Internal Medicine

## 2021-09-14 ENCOUNTER — Inpatient Hospital Stay: Payer: BC Managed Care – PPO | Attending: Internal Medicine

## 2021-09-17 ENCOUNTER — Telehealth: Payer: Self-pay | Admitting: *Deleted

## 2021-09-17 NOTE — Telephone Encounter (Signed)
Patient stated that she think she missed a recent apt with Dr. Rogue Bussing and wants a call back to r/s her apts with Dr. Jacinto Reap. Please call patient on Monday.

## 2021-09-30 ENCOUNTER — Other Ambulatory Visit: Payer: Self-pay | Admitting: Family Medicine

## 2021-09-30 NOTE — Telephone Encounter (Signed)
Refill request for butalbital-acetaminophen-caffeine (FIORICET) 50-325-40 MG tablet  LOV - 1201/22 Next OV - not scheduled Last refill - 07/01/21 #90/2

## 2021-09-30 NOTE — Telephone Encounter (Signed)
Sent. Thanks.   

## 2021-10-04 ENCOUNTER — Inpatient Hospital Stay (HOSPITAL_BASED_OUTPATIENT_CLINIC_OR_DEPARTMENT_OTHER): Payer: BC Managed Care – PPO | Admitting: Internal Medicine

## 2021-10-04 ENCOUNTER — Other Ambulatory Visit: Payer: Self-pay

## 2021-10-04 ENCOUNTER — Inpatient Hospital Stay: Payer: BC Managed Care – PPO | Attending: Internal Medicine

## 2021-10-04 ENCOUNTER — Encounter: Payer: Self-pay | Admitting: Internal Medicine

## 2021-10-04 DIAGNOSIS — E876 Hypokalemia: Secondary | ICD-10-CM | POA: Diagnosis not present

## 2021-10-04 DIAGNOSIS — Z79899 Other long term (current) drug therapy: Secondary | ICD-10-CM | POA: Insufficient documentation

## 2021-10-04 DIAGNOSIS — Z803 Family history of malignant neoplasm of breast: Secondary | ICD-10-CM | POA: Insufficient documentation

## 2021-10-04 DIAGNOSIS — F419 Anxiety disorder, unspecified: Secondary | ICD-10-CM | POA: Insufficient documentation

## 2021-10-04 DIAGNOSIS — Z87891 Personal history of nicotine dependence: Secondary | ICD-10-CM | POA: Diagnosis not present

## 2021-10-04 DIAGNOSIS — Z170 Estrogen receptor positive status [ER+]: Secondary | ICD-10-CM | POA: Insufficient documentation

## 2021-10-04 DIAGNOSIS — G4700 Insomnia, unspecified: Secondary | ICD-10-CM | POA: Insufficient documentation

## 2021-10-04 DIAGNOSIS — C50412 Malignant neoplasm of upper-outer quadrant of left female breast: Secondary | ICD-10-CM | POA: Diagnosis not present

## 2021-10-04 DIAGNOSIS — E039 Hypothyroidism, unspecified: Secondary | ICD-10-CM | POA: Diagnosis not present

## 2021-10-04 DIAGNOSIS — Z79890 Hormone replacement therapy (postmenopausal): Secondary | ICD-10-CM | POA: Insufficient documentation

## 2021-10-04 DIAGNOSIS — Z800 Family history of malignant neoplasm of digestive organs: Secondary | ICD-10-CM | POA: Diagnosis not present

## 2021-10-04 DIAGNOSIS — K5900 Constipation, unspecified: Secondary | ICD-10-CM | POA: Diagnosis not present

## 2021-10-04 LAB — COMPREHENSIVE METABOLIC PANEL
ALT: 29 U/L (ref 0–44)
AST: 30 U/L (ref 15–41)
Albumin: 3.7 g/dL (ref 3.5–5.0)
Alkaline Phosphatase: 155 U/L — ABNORMAL HIGH (ref 38–126)
Anion gap: 8 (ref 5–15)
BUN: 12 mg/dL (ref 8–23)
CO2: 21 mmol/L — ABNORMAL LOW (ref 22–32)
Calcium: 8.6 mg/dL — ABNORMAL LOW (ref 8.9–10.3)
Chloride: 106 mmol/L (ref 98–111)
Creatinine, Ser: 0.96 mg/dL (ref 0.44–1.00)
GFR, Estimated: >60 mL/min (ref >60)
Glucose, Bld: 107 mg/dL — ABNORMAL HIGH (ref 70–99)
Potassium: 3.7 mmol/L (ref 3.5–5.1)
Sodium: 135 mmol/L (ref 135–145)
Total Bilirubin: <0.1 mg/dL — ABNORMAL LOW (ref 0.3–1.2)
Total Protein: 7 g/dL (ref 6.5–8.1)

## 2021-10-04 LAB — CBC WITH DIFFERENTIAL/PLATELET
Abs Immature Granulocytes: 0.04 10*3/uL (ref 0.00–0.07)
Basophils Absolute: 0.1 10*3/uL (ref 0.0–0.1)
Basophils Relative: 1 %
Eosinophils Absolute: 0.3 10*3/uL (ref 0.0–0.5)
Eosinophils Relative: 4 %
HCT: 38.6 % (ref 36.0–46.0)
Hemoglobin: 12.5 g/dL (ref 12.0–15.0)
Immature Granulocytes: 0 %
Lymphocytes Relative: 22 %
Lymphs Abs: 2.1 10*3/uL (ref 0.7–4.0)
MCH: 31.2 pg (ref 26.0–34.0)
MCHC: 32.4 g/dL (ref 30.0–36.0)
MCV: 96.3 fL (ref 80.0–100.0)
Monocytes Absolute: 0.6 10*3/uL (ref 0.1–1.0)
Monocytes Relative: 6 %
Neutro Abs: 6.4 10*3/uL (ref 1.7–7.7)
Neutrophils Relative %: 67 %
Platelets: 311 10*3/uL (ref 150–400)
RBC: 4.01 MIL/uL (ref 3.87–5.11)
RDW: 13.1 % (ref 11.5–15.5)
WBC: 9.5 10*3/uL (ref 4.0–10.5)
nRBC: 0 % (ref 0.0–0.2)

## 2021-10-04 LAB — TSH: TSH: 10.609 u[IU]/mL — ABNORMAL HIGH (ref 0.350–4.500)

## 2021-10-04 MED ORDER — LETROZOLE 2.5 MG PO TABS: ORAL_TABLET | ORAL | 1 refills | Status: DC

## 2021-10-04 NOTE — Progress Notes (Signed)
[facility] NOTE  Patient Care Team: Tonia Ghent, MD as PCP - General (Family Medicine) Cammie Sickle, MD as Consulting Physician (Internal Medicine) Rolm Bookbinder, MD as Consulting Physician (General Surgery) Rico Junker, [name]  #  Oncology History Overview Note  # DEC 2020- LEFT BREAST-TWO PRIM ARIES:   #A]-IMC; ER/PR >90%; Her 2-IHC-2+; FISH-NEG-2 ; MRI- 3.7 CM; s/p s/p lumpectomy; s/p reexcision-final margins negative.  Stage Ib.    #B] Bx- IMC; G-1; ER/PR-pos; her2 NEG. s/p lumpectomy; margins negative [as per Dr.Wakefield; discussed at [facility] tumor conference]  # MARCH 31st 2021- START Taxotere [60 mg/m]; Cytoxan; growth factor x4 cycles; s/p radiation 04/29/2020  # MID-OCT 2021- Start anastrozole; MID MARCH 2022- stopped sec to intolerance; MID April 20222- START AROMASIN; STOPPED in OCT, 2022 [extreme fatigue heart fluttering]. FEB 22nd, 2023- Letrozole half a pill  #Extreme anxiety/chronic diarrhea/chronic severe hypokalemia/history of migraines/depression  Used estrogen and progesterone therapy: on premarin for post- menopausal x 2-3 years- discontinued in JAN 2021.   # SURVIVORSHIP:   DIAGNOSIS: Breast cancer  STAGE: IB & I     ;  GOALS: Cure     Carcinoma of upper-outer quadrant of left breast in female, estrogen receptor positive ([place])  08/14/2019 Initial Diagnosis   Carcinoma of upper-outer quadrant of left breast in female, estrogen receptor positive ([place])   09/10/2019 Genetic Testing   Negative genetic testing on the STAT and common hereditary cancer panel.  The Common Hereditary Gene Panel offered by [facility] includes sequencing and/or deletion duplication testing of the following 48 genes: APC, ATM, AXIN2, BARD1, BMPR1A, BRCA1, BRCA2, BRIP1, CDH1, CDK4, CDKN2A (p14ARF), CDKN2A (p16INK4a), CHEK2, CTNNA1, DICER1, EPCAM (Deletion/duplication  testing only), GREM1 (promoter region deletion/duplication testing only), KIT, MEN1, MLH1, MSH2, MSH3, MSH6, MUTYH, NBN, NF1, NHTL1, PALB2, PDGFRA, PMS2, POLD1, POLE, PTEN, RAD50, RAD51C, RAD51D, RNF43, SDHB, SDHC, SDHD, SMAD4, SMARCA4. STK11, TP53, TSC1, TSC2, and VHL.  The following genes were evaluated for sequence changes only: SDHA and HOXB13 c.251G>A variant only. The report date is September 10, 2019.   10/03/2019 Cancer Staging   Staging form: Breast, AJCC 8th Edition - Pathologic stage from 10/03/2019: Stage IB (pT2, pN1a, cM0, G2, ER+, PR+, HER2-) - Signed by Gardenia Phlegm, NP on 10/16/2019    10/09/2019 - 10/09/2019 Chemotherapy   The patient had dexamethasone (DECADRON) 4 MG tablet, 0 of 1 cycle, Start date: --, End date: -- DOXOrubicin (ADRIAMYCIN) chemo injection 98 mg, 60 mg/m2, Intravenous,  Once, 0 of 4 cycles PALONOSETRON HCL INJECTION 0.25 MG/5ML, 0.25 mg, Intravenous,  Once, 0 of 4 cycles pegfilgrastim-jmdb (FULPHILA) injection 6 mg, 6 mg, Subcutaneous,  Once, 0 of 4 cycles cyclophosphamide (CYTOXAN) 980 mg in sodium chloride 0.9 % 250 mL chemo infusion, 600 mg/m2, Intravenous,  Once, 0 of 4 cycles PACLitaxel (TAXOL) 132 mg in sodium chloride 0.9 % 250 mL chemo infusion (</= 25m/m2), 80 mg/m2, Intravenous,  Once, 0 of 12 cycles FOSAPREPITANT IV INFUSION 150 MG, 150 mg, Intravenous,  Once, 0 of 4 cycles   for chemotherapy treatment.     11/13/2019 -  Chemotherapy   The patient had dexamethasone (DECADRON) 4 MG tablet, 8 mg, Oral, 2 times daily, 1 of 1 cycle, Start date: --, End date: -- palonosetron (ALOXI) injection 0.25 mg, 0.25 mg, Intravenous,  Once, 4 of 4 cycles Administration: 0.25 mg (11/13/2019), 0.25 mg (12/04/2019), 0.25 mg (12/25/2019), 0.25 mg (01/15/2020) pegfilgrastim-jmdb (FULPHILA) injection 6  mg, 6 mg, Subcutaneous,  Once, 4 of 4 cycles Administration: 6 mg (11/14/2019), 6 mg (12/05/2019), 6 mg (12/26/2019), 6 mg (01/16/2020) cyclophosphamide (CYTOXAN) 1,000 mg  in sodium chloride 0.9 % 250 mL chemo infusion, 980 mg, Intravenous,  Once, 4 of 4 cycles Administration: 1,000 mg (11/13/2019), 1,000 mg (12/04/2019), 1,000 mg (12/25/2019), 1,000 mg (01/15/2020) DOCEtaxel (TAXOTERE) 100 mg in sodium chloride 0.9 % 250 mL chemo infusion, 60 mg/m2 = 100 mg (100 % of original dose 60 mg/m2), Intravenous,  Once, 4 of 4 cycles Dose modification: 60 mg/m2 (original dose 60 mg/m2, Cycle 1, Reason: Provider Judgment) Administration: 100 mg (11/13/2019), 100 mg (12/04/2019), 100 mg (12/25/2019), 100 mg (01/15/2020)   for chemotherapy treatment.        HISTORY OF PRESENTING ILLNESS:  Jessica Barnes 61 y.o.  female with of breast cancer  [T2N1-ER/PR positive HER-2 negative; T1N1-ER/PR positive HER-2 negative 2 separate primaries] s/p adjuvant chemotherapy; also s/p adjuvant radiation most recently on Aromasin is here for follow-up.  Patient stopped Aromasin approximately 3 months ago because of poor tolerance.  Complains of extreme fatigue complaints of heart fluttering while on Aromasin.  Patient symptoms resolved approximately week after.  Denies any worsening hot flashes.  Denies any worsening fatigue.  Chronic joint pains not any worse.  No chest pain or shortness of breath or cough.  Review of Systems  Constitutional:  Positive for malaise/fatigue. Negative for chills, diaphoresis, fever and weight loss.  HENT:  Negative for nosebleeds and sore throat.   Eyes:  Negative for double vision.  Respiratory:  Negative for cough, hemoptysis, sputum production and wheezing.   Cardiovascular:  Negative for chest pain, palpitations, orthopnea and leg swelling.  Gastrointestinal:  Negative for abdominal pain, blood in stool, constipation, heartburn, melena, nausea and vomiting.  Genitourinary:  Negative for dysuria, frequency and urgency.  Musculoskeletal:  Negative for back pain and joint pain.  Neurological:  Negative for dizziness, tingling, focal weakness and  weakness.  Endo/Heme/Allergies:  Does not bruise/bleed easily.  Psychiatric/Behavioral:  The patient is nervous/anxious and has insomnia.     MEDICAL HISTORY:  Past Medical History:  Diagnosis Date   Anxiety 10/13/96   Chest pain    atypical, hosp for that and depression 8/10-8/11/08   COPD (chronic obstructive pulmonary disease) ([place]) 09/15/05   by x-ray   Depression 10/13/96   hospital 8/10-8/11/08.  notable history: son with TBI after MVA   Family history of breast cancer    Family history of colon cancer    Family history of pancreatic cancer    Hyperlipidemia 09/15/98   Hypothyroidism 01/14/96   Migraine    31 yoa   NSVD (normal spontaneous vaginal delivery)    x 1   Personal history of chemotherapy    finished 01/2020   Personal history of radiation therapy    finished 04/2020   PONV (postoperative nausea and vomiting)    PTSD (post-traumatic stress disorder)    likely PTSD after son's MVA/TBI in 2007, s/p counseling    SURGICAL HISTORY: Past Surgical History:  Procedure Laterality Date   APPENDECTOMY     61 years of age   BREAST BIOPSY Left 08/06/2019   [place] bx/ ribbon clip/ invasive mammary carcinoma   BREAST BIOPSY Left 09/06/2019   [place] bx, heart clip, invasive mammary carcinoma    BREAST BIOPSY Left 05/11/2021   u/s biopsy/ path pending   BREAST LUMPECTOMY Left 10/21/2019   Inv ductal Ca and DCIS    BREAST LUMPECTOMY WITH  RADIOACTIVE SEED AND SENTINEL LYMPH NODE BIOPSY Left 10/01/2019   Procedure: LEFT BREAST LUMPECTOMY WITH BRACKETED RADIOACTIVE SEEDS AND LEFT AXILLARY SENTINEL LYMPH NODE BIOPSY;  Surgeon: Rolm Bookbinder, MD;  Location: [facility];  Service: General;  Laterality: Left;   PORTACATH PLACEMENT Right 10/28/2019   Procedure: INSERTION PORT-A-CATH WITH ULTRASOUND GUIDANCE;  Surgeon: Rolm Bookbinder, MD;  Location: [facility];  Service: General;  Laterality: Right;   RE-EXCISION OF BREAST LUMPECTOMY Left 10/28/2019    Procedure: RE-EXCISION OF LEFT BREAST LUMPECTOMY;  Surgeon: Rolm Bookbinder, MD;  Location: [facility];  Service: General;  Laterality: Left;    SOCIAL HISTORY: Social History   Socioeconomic History   Marital status: Married    Spouse name: Not on file   Number of children: Not on file   Years of education: Not on file   Highest education level: Not on file  Occupational History   Not on file  Tobacco Use   Smoking status: Former    Packs/day: 0.50    Years: 8.00    Pack years: 4.00    Types: Cigarettes   Smokeless tobacco: Never   Tobacco comments:    quit 04/15/2014  Vaping Use   Vaping Use: Every day  Substance and Sexual Activity   Alcohol use: No    Alcohol/week: 0.0 standard drinks   Drug use: No   Sexual activity: Not on file  Other Topics Concern   Not on file  Social History Narrative   Remarried, lives with husband 1 son, he had TBI and short term memory loss after prolonged illness      #Patient lives in [place] with her husband; prior history of smoking/currently vapes.  No significant alcohol use.  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    Social Determinants of Health   Financial Resource Strain: Not on file  Food Insecurity: Not on file  Transportation Needs: Not on file  Physical Activity: Not on file  Stress: Not on file  Social Connections: Not on file  Intimate Partner Violence: Not on file    FAMILY HISTORY: Family History  Problem Relation Age of Onset   Hypertension Mother    Colon cancer Father        d. 12   Thyroid disease Sister    Breast cancer Paternal Aunt    Breast cancer Paternal Grandmother    Colon cancer Paternal Grandmother    Breast cancer Cousin        paternal aunt's daughter   Colon cancer Paternal Uncle        3 pat uncles with colon cancer at unknown ages   Pancreatic cancer Maternal Uncle        d. 21   Breast cancer Paternal Aunt     ALLERGIES:  is allergic to  sulfamethoxazole-trimethoprim and codeine.  MEDICATIONS:  Current Outpatient Medications  Medication Sig Dispense Refill   butalbital-acetaminophen-caffeine (FIORICET) 50-325-40 MG tablet TAKE 1 TABLET BY MOUTH EVERY 4 TO 6 HOURS AS NEEDED FOR HEADACHE 90 tablet 2   Calcium Carbonate-Vit D-Min (CALCIUM 1200 PO) Take 1 capsule by mouth daily.     diazepam (VALIUM) 10 MG tablet TAKE 1 TABLET BY MOUTH EVERY NIGHT AT BEDTIME AS NEEDED FOR ANXIETY. 30 tablet 1   diphenoxylate-atropine (LOMOTIL) 2.5-0.025 MG tablet TAKE 1 TABLET BY MOUTH 4 TIMES A DAY AS NEEDED FOR DIARHEA OR LOOSE STOOLS. TAKE WITH IMMODIUM 30 tablet 0   FLUoxetine (PROZAC) 20 MG capsule TAKE 3 CAPSULES  BY MOUTH DAILY 270 capsule 0   ibuprofen (ADVIL) 100 MG tablet Take 100 mg by mouth every 6 (six) hours as needed for fever.      letrozole (FEMARA) 2.5 MG tablet Take 1/2 pill a day. 30 tablet 1   levothyroxine (SYNTHROID) 88 MCG tablet TAKE 1 TABLET BY MOUTH ONCE A DAY 90 tablet 1   loperamide (IMODIUM A-D) 2 MG tablet Take 4 mg by mouth 4 (four) times daily as needed for diarrhea or loose stools.      phenazopyridine (PYRIDIUM) 200 MG tablet Take 1 tablet (200 mg total) by mouth 3 (three) times daily as needed for pain. 10 tablet 0   potassium chloride SA (KLOR-CON M) 20 MEQ tablet TAKE 1 TABLET BY MOUTH TWICE A DAY 60 tablet 0   tiZANidine (ZANAFLEX) 4 MG tablet Take 1 tablet (4 mg total) by mouth every 6 (six) hours as needed for muscle spasms. 30 tablet 0   No current facility-administered medications for this visit.      Marland Kitchen  PHYSICAL EXAMINATION: ECOG PERFORMANCE STATUS: 0 - Asymptomatic  Vitals:   10/04/21 1508  BP: 116/64  Pulse: (!) 107  Temp: 98.2 F (36.8 C)  SpO2: 100%   Filed Weights   10/04/21 1508  Weight: 150 lb 6.4 oz (68.2 kg)    Physical Exam Constitutional:      Comments: Accompanied by her sister.  Ambulating independently.  HENT:     Head: Normocephalic and atraumatic.     Mouth/Throat:      Pharynx: No oropharyngeal exudate.  Eyes:     Pupils: Pupils are equal, round, and reactive to light.  Cardiovascular:     Rate and Rhythm: Normal rate and regular rhythm.  Pulmonary:     Effort: Pulmonary effort is normal. No respiratory distress.     Breath sounds: Normal breath sounds. No wheezing.  Abdominal:     General: Bowel sounds are normal. There is no distension.     Palpations: Abdomen is soft. There is no mass.     Tenderness: There is no abdominal tenderness. There is no guarding or rebound.  Musculoskeletal:        General: No tenderness. Normal range of motion.     Cervical back: Normal range of motion and neck supple.  Skin:    General: Skin is warm.     Comments: Mild left underarm swelling/chest wall swelling.  Neurological:     Mental Status: She is alert and oriented to person, place, and time.  Psychiatric:        Mood and Affect: Affect normal.     Comments: Anxious   LABORATORY DATA:  I have reviewed the data as listed Lab Results  Component Value Date   WBC 9.5 10/04/2021   HGB 12.5 10/04/2021   HCT 38.6 10/04/2021   MCV 96.3 10/04/2021   PLT 311 10/04/2021   Recent Labs    03/12/21 1502 06/11/21 1454 10/04/21 1453  NA 136 134* 135  K 3.4* 4.2 3.7  CL 104 101 106  CO2 23 24 21*  GLUCOSE 142* 97 107*  BUN 10 14 12   CREATININE 1.04* 0.91 0.96  CALCIUM 9.2 9.5 8.6*  GFRNONAA >60 >60 >60  PROT 7.7 8.0 7.0  ALBUMIN 4.2 4.4 3.7  AST 24 33 30  ALT 18 35 29  ALKPHOS 145* 152* 155*  BILITOT 0.4 0.3 <0.1*    RADIOGRAPHIC STUDIES: I have personally reviewed the radiological images as listed and agreed with  the findings in the report. No results found.  ASSESSMENT & PLAN:   Carcinoma of upper-outer quadrant of left breast in female, estrogen receptor positive ([place]) # Left breast cancer-2 separate primaries- A] T2N1-ER/PR positive HER-2 negative;  B] T1N1 ER/PR positive HER-2 negative. STABLE; No evidence of recurrence.   # Currently OFF  Aromasin-secondary moderate side effects- fatigue/hot flashes/racing heart.  Symptoms better off Aromasin.  OCT 2022-  No MR evidence of breast malignancy; LEFT breast surgical changes.  START LETROZOLE 1/2 pill a day-given patient's concerns for potential side effects.  Discussed the importance of adjuvant endocrine therapy-discussed without adjuvant therapy high risk of recurrence goes up to 30%.  Also discussed option of tamoxifen.  # Chronic hypokalemia/diarrhea-potassium-3.7; on [place];  # Elevated LFTs-unclear etiology s/p extensive work-up/GI evaluation [2021] alkaline phosphatase-152-overall STABLE;  # BMD- T score= 0.9; continue Vit D [ca-constipation]; STABLE; discused adjuvant Zometa-declined.  # Fatigue/hypothyroidism-improved currently on Synthroid 88 mcg-TSH today 1.7- STABLE.   # Insomnia/likely secondary to anxiety- on valium improved.  Stable; however given the difficulty with getting ongoing prescription of Valium, I recommend evaluation with psychiatry.  However patient declined evaluation with psychiatry after long discussion.  # DISPOSITION:  #  #follow up in 1 months-MD; labs- cbc/cmp;-Dr.B.  All questions were answered. The patient/family knows to call the clinic with any problems, questions or concerns.    Cammie Sickle, MD 10/04/2021 4:19 PM

## 2021-10-04 NOTE — Assessment & Plan Note (Addendum)
#  Left breast cancer-2 separate primaries- A] T2N1-ER/PR positive HER-2 negative;  B] T1N1 ER/PR positive HER-2 negative. STABLE; No evidence of recurrence.  # Currently OFF Aromasin-secondary moderate side effects- fatigue/hot flashes/racing heart.  Symptoms better off Aromasin.  OCT 2022-  No MR evidence of breast malignancy; LEFT breast surgical changes.  START LETROZOLE 1/2 pill a day-given patient's concerns for potential side effects.  Discussed the importance of adjuvant endocrine therapy-discussed without adjuvant therapy high risk of recurrence goes up to 30%.  Also discussed option of tamoxifen.  # Chronic hypokalemia/diarrhea-potassium-3.7; on [place];  # Elevated LFTs-unclear etiology s/p extensive work-up/GI evaluation [2021] alkaline phosphatase-152-overall STABLE;  # BMD- T score= 0.9; continue Vit D [ca-constipation]; STABLE; discused adjuvant Zometa-declined.  # Fatigue/hypothyroidism-improved currently on Synthroid 88 mcg-TSH today 1.7- STABLE.   # Insomnia/likely secondary to anxiety- on valium improved.  Stable; however given the difficulty with getting ongoing prescription of Valium, I recommend evaluation with psychiatry.  However patient declined evaluation with psychiatry after long discussion.  # DISPOSITION:  #  #follow up in 1 months-MD; labs- cbc/cmp;-Dr.B.

## 2021-10-08 ENCOUNTER — Telehealth: Payer: Self-pay

## 2021-10-08 NOTE — Telephone Encounter (Signed)
Call returned to patient.  Advised her that the results were routed to Dr. Josefine Class office this morning and she should also give that office a call to discuss possible change in Levothyroxine dose.

## 2021-10-08 NOTE — Telephone Encounter (Signed)
Pt called back, she stated she would like someone to give her another call so she can discuss her results.

## 2021-10-08 NOTE — Telephone Encounter (Signed)
Left message on patient's voicemail informing her of TSH results and that they have been forwarded to her PCP.

## 2021-10-08 NOTE — Telephone Encounter (Signed)
-----   Message from Cammie Sickle, MD sent at 10/07/2021  7:15 PM EST ----- Regarding: FW: Hi- please inform pt that her TSH is higher than the normal range. Please have contact PCP re: synthroid adjustment. Thanks, GB ----- Message ----- From: Verlon Au, NP Sent: 10/05/2021  11:48 AM EST To: Cammie Sickle, MD   ----- Message ----- From: [facility] [place] Sent: 10/04/2021   5:19 PM EST To: Verlon Au, NP

## 2021-10-18 ENCOUNTER — Other Ambulatory Visit: Payer: Self-pay | Admitting: Family Medicine

## 2021-10-18 DIAGNOSIS — E039 Hypothyroidism, unspecified: Secondary | ICD-10-CM

## 2021-10-18 MED ORDER — LEVOTHYROXINE SODIUM 88 MCG PO TABS: 88.0000 ug | ORAL_TABLET | Freq: Every day | ORAL | 1 refills | Status: DC

## 2021-10-19 ENCOUNTER — Other Ambulatory Visit: Payer: Self-pay | Admitting: Family Medicine

## 2021-10-19 ENCOUNTER — Other Ambulatory Visit: Payer: Self-pay | Admitting: Nurse Practitioner

## 2021-10-20 NOTE — Telephone Encounter (Signed)
Refill request for DIAZEPAM 10 MG TAB ? ?LOV - 07/15/2021 ?Next OV - not scheduled ?Last refill - 08/11/21 #30/1 ? ?

## 2021-10-29 ENCOUNTER — Other Ambulatory Visit: Payer: Self-pay | Admitting: *Deleted

## 2021-10-29 MED ORDER — LETROZOLE 2.5 MG PO TABS: ORAL_TABLET | ORAL | 1 refills | Status: DC

## 2021-10-29 MED ORDER — POTASSIUM CHLORIDE CRYS ER 20 MEQ PO TBCR: 20.0000 meq | EXTENDED_RELEASE_TABLET | Freq: Two times a day (BID) | ORAL | 0 refills | Status: DC

## 2021-10-29 NOTE — Telephone Encounter (Signed)
Component Ref Range & Units 3 wk ago ?(10/04/21) 4 mo ago ?(06/11/21) 7 mo ago ?(03/12/21) 9 mo ago ?(01/18/21) 1 yr ago ?(10/26/20) 1 yr ago ?(07/27/20) 1 yr ago ?(06/29/20)  ?Potassium 3.5 - 5.1 mmol/L 3.7  4.2  3.4 Low   3.7  4.0  3.6  3.8   ? ?

## 2021-11-01 ENCOUNTER — Inpatient Hospital Stay: Payer: BC Managed Care – PPO

## 2021-11-01 ENCOUNTER — Inpatient Hospital Stay: Payer: BC Managed Care – PPO | Admitting: Internal Medicine

## 2021-12-01 ENCOUNTER — Inpatient Hospital Stay: Payer: BC Managed Care – PPO | Attending: Internal Medicine

## 2021-12-01 ENCOUNTER — Inpatient Hospital Stay (HOSPITAL_BASED_OUTPATIENT_CLINIC_OR_DEPARTMENT_OTHER): Payer: BC Managed Care – PPO | Admitting: Internal Medicine

## 2021-12-01 DIAGNOSIS — Z803 Family history of malignant neoplasm of breast: Secondary | ICD-10-CM | POA: Insufficient documentation

## 2021-12-01 DIAGNOSIS — E876 Hypokalemia: Secondary | ICD-10-CM | POA: Insufficient documentation

## 2021-12-01 DIAGNOSIS — R7989 Other specified abnormal findings of blood chemistry: Secondary | ICD-10-CM | POA: Insufficient documentation

## 2021-12-01 DIAGNOSIS — E785 Hyperlipidemia, unspecified: Secondary | ICD-10-CM | POA: Diagnosis not present

## 2021-12-01 DIAGNOSIS — Z170 Estrogen receptor positive status [ER+]: Secondary | ICD-10-CM | POA: Insufficient documentation

## 2021-12-01 DIAGNOSIS — R5383 Other fatigue: Secondary | ICD-10-CM | POA: Diagnosis not present

## 2021-12-01 DIAGNOSIS — Z79899 Other long term (current) drug therapy: Secondary | ICD-10-CM | POA: Diagnosis not present

## 2021-12-01 DIAGNOSIS — C50412 Malignant neoplasm of upper-outer quadrant of left female breast: Secondary | ICD-10-CM | POA: Diagnosis not present

## 2021-12-01 DIAGNOSIS — Z79811 Long term (current) use of aromatase inhibitors: Secondary | ICD-10-CM | POA: Insufficient documentation

## 2021-12-01 DIAGNOSIS — G4700 Insomnia, unspecified: Secondary | ICD-10-CM | POA: Insufficient documentation

## 2021-12-01 DIAGNOSIS — J449 Chronic obstructive pulmonary disease, unspecified: Secondary | ICD-10-CM | POA: Insufficient documentation

## 2021-12-01 DIAGNOSIS — Z87891 Personal history of nicotine dependence: Secondary | ICD-10-CM | POA: Diagnosis not present

## 2021-12-01 DIAGNOSIS — Z800 Family history of malignant neoplasm of digestive organs: Secondary | ICD-10-CM | POA: Diagnosis not present

## 2021-12-01 DIAGNOSIS — E039 Hypothyroidism, unspecified: Secondary | ICD-10-CM | POA: Insufficient documentation

## 2021-12-01 DIAGNOSIS — R197 Diarrhea, unspecified: Secondary | ICD-10-CM | POA: Diagnosis not present

## 2021-12-01 DIAGNOSIS — Z79890 Hormone replacement therapy (postmenopausal): Secondary | ICD-10-CM | POA: Diagnosis not present

## 2021-12-01 DIAGNOSIS — Z9221 Personal history of antineoplastic chemotherapy: Secondary | ICD-10-CM | POA: Diagnosis not present

## 2021-12-01 DIAGNOSIS — Z923 Personal history of irradiation: Secondary | ICD-10-CM | POA: Insufficient documentation

## 2021-12-01 LAB — CBC WITH DIFFERENTIAL/PLATELET
Abs Immature Granulocytes: 0.03 10*3/uL (ref 0.00–0.07)
Basophils Absolute: 0.1 10*3/uL (ref 0.0–0.1)
Basophils Relative: 1 %
Eosinophils Absolute: 0.3 10*3/uL (ref 0.0–0.5)
Eosinophils Relative: 4 %
HCT: 40.1 % (ref 36.0–46.0)
Hemoglobin: 12.9 g/dL (ref 12.0–15.0)
Immature Granulocytes: 0 %
Lymphocytes Relative: 25 %
Lymphs Abs: 1.8 10*3/uL (ref 0.7–4.0)
MCH: 30.8 pg (ref 26.0–34.0)
MCHC: 32.2 g/dL (ref 30.0–36.0)
MCV: 95.7 fL (ref 80.0–100.0)
Monocytes Absolute: 0.4 10*3/uL (ref 0.1–1.0)
Monocytes Relative: 6 %
Neutro Abs: 4.5 10*3/uL (ref 1.7–7.7)
Neutrophils Relative %: 64 %
Platelets: 320 10*3/uL (ref 150–400)
RBC: 4.19 MIL/uL (ref 3.87–5.11)
RDW: 13 % (ref 11.5–15.5)
WBC: 7.1 10*3/uL (ref 4.0–10.5)
nRBC: 0 % (ref 0.0–0.2)

## 2021-12-01 LAB — COMPREHENSIVE METABOLIC PANEL
ALT: 31 U/L (ref 0–44)
AST: 26 U/L (ref 15–41)
Albumin: 4.1 g/dL (ref 3.5–5.0)
Alkaline Phosphatase: 191 U/L — ABNORMAL HIGH (ref 38–126)
Anion gap: 9 (ref 5–15)
BUN: 10 mg/dL (ref 8–23)
CO2: 23 mmol/L (ref 22–32)
Calcium: 9.1 mg/dL (ref 8.9–10.3)
Chloride: 103 mmol/L (ref 98–111)
Creatinine, Ser: 1.03 mg/dL — ABNORMAL HIGH (ref 0.44–1.00)
GFR, Estimated: >60 mL/min (ref >60)
Glucose, Bld: 117 mg/dL — ABNORMAL HIGH (ref 70–99)
Potassium: 3.3 mmol/L — ABNORMAL LOW (ref 3.5–5.1)
Sodium: 135 mmol/L (ref 135–145)
Total Bilirubin: 0.4 mg/dL (ref 0.3–1.2)
Total Protein: 7.7 g/dL (ref 6.5–8.1)

## 2021-12-01 NOTE — Progress Notes (Signed)
Occasional left breast pain and lymphedema discomfort.   ?

## 2021-12-01 NOTE — Progress Notes (Signed)
[facility] [place] ?CONSULT NOTE ? ?Patient Care Team: ?Tonia Ghent, MD as PCP - General (Family Medicine) ?Cammie Sickle, MD as Consulting Physician (Internal Medicine) ?Rolm Bookbinder, MD as Consulting Physician (General Surgery) ?Rico Junker, [name] ? ?CHIEF COMPLAINTS/PURPOSE OF CONSULTATION: Breast cancer ? ?#  ?Oncology History Overview Note  ?# DEC 2020- LEFT BREAST-TWO PRIM ARIES:  ? ?#A]-IMC; ER/PR >90%; Her 2-IHC-2+; FISH-NEG-2 ; MRI- 3.7 CM; s/p s/p lumpectomy; s/p reexcision-final margins negative.  Stage Ib.  ? ? #B] Bx- IMC; G-1; ER/PR-pos; her2 NEG. s/p lumpectomy; margins negative [as per Dr.Wakefield; discussed at [facility] tumor conference] ? ?# MARCH 31st 2021- START Taxotere [60 mg/m?]; Cytoxan; growth factor x4 cycles; s/p radiation 04/29/2020 ? ?# MID-OCT 2021- Start anastrozole; MID MARCH 2022- stopped sec to intolerance; MID April 20222- START AROMASIN; STOPPED in OCT, 2022 [extreme fatigue heart fluttering]. FEB 22nd, 2023- Letrozole half a pill ? ?#Extreme anxiety/chronic diarrhea/chronic severe hypokalemia/history of migraines/depression ? ?Used estrogen and progesterone therapy: on premarin for post- menopausal x 2-3 years- discontinued in JAN 2021.  ? ?# SURVIVORSHIP:  ? ?DIAGNOSIS: Breast cancer ? ?STAGE: IB & I     ;  GOALS: Cure ? ? ?  ?Carcinoma of upper-outer quadrant of left breast in female, estrogen receptor positive ([place])  ?08/14/2019 Initial Diagnosis  ? Carcinoma of upper-outer quadrant of left breast in female, estrogen receptor positive ([place]) ?  ?09/10/2019 Genetic Testing  ? Negative genetic testing on the STAT and common hereditary cancer panel.  The Common Hereditary Gene Panel offered by [facility] includes sequencing and/or deletion duplication testing of the following 48 genes: APC, ATM, AXIN2, BARD1, BMPR1A, BRCA1, BRCA2, BRIP1, CDH1, CDK4, CDKN2A (p14ARF), CDKN2A (p16INK4a), CHEK2, CTNNA1, DICER1, EPCAM (Deletion/duplication  testing only), GREM1 (promoter region deletion/duplication testing only), KIT, MEN1, MLH1, MSH2, MSH3, MSH6, MUTYH, NBN, NF1, NHTL1, PALB2, PDGFRA, PMS2, POLD1, POLE, PTEN, RAD50, RAD51C, RAD51D, RNF43, SDHB, SDHC, SDHD, SMAD4, SMARCA4. STK11, TP53, TSC1, TSC2, and VHL.  The following genes were evaluated for sequence changes only: SDHA and HOXB13 c.251G>A variant only. The report date is September 10, 2019. ?  ?10/03/2019 Cancer Staging  ? Staging form: Breast, AJCC 8th Edition ?- Pathologic stage from 10/03/2019: Stage IB (pT2, pN1a, cM0, G2, ER+, PR+, HER2-) - Signed by Gardenia Phlegm, NP on 10/16/2019 ? ?  ?10/09/2019 - 10/09/2019 Chemotherapy  ? The patient had dexamethasone (DECADRON) 4 MG tablet, 0 of 1 cycle, Start date: --, End date: -- ?DOXOrubicin (ADRIAMYCIN) chemo injection 98 mg, 60 mg/m2, Intravenous,  Once, 0 of 4 cycles ?PALONOSETRON HCL INJECTION 0.25 MG/5ML, 0.25 mg, Intravenous,  Once, 0 of 4 cycles ?pegfilgrastim-jmdb (FULPHILA) injection 6 mg, 6 mg, Subcutaneous,  Once, 0 of 4 cycles ?cyclophosphamide (CYTOXAN) 980 mg in sodium chloride 0.9 % 250 mL chemo infusion, 600 mg/m2, Intravenous,  Once, 0 of 4 cycles ?PACLitaxel (TAXOL) 132 mg in sodium chloride 0.9 % 250 mL chemo infusion (</= 75m/m2), 80 mg/m2, Intravenous,  Once, 0 of 12 cycles ?FOSAPREPITANT IV INFUSION 150 MG, 150 mg, Intravenous,  Once, 0 of 4 cycles ? ? for chemotherapy treatment.  ? ?  ?11/13/2019 -  Chemotherapy  ? The patient had dexamethasone (DECADRON) 4 MG tablet, 8 mg, Oral, 2 times daily, 1 of 1 cycle, Start date: --, End date: -- ?palonosetron (ALOXI) injection 0.25 mg, 0.25 mg, Intravenous,  Once, 4 of 4 cycles ?Administration: 0.25 mg (11/13/2019), 0.25 mg (12/04/2019), 0.25 mg (12/25/2019), 0.25 mg (01/15/2020) ?pegfilgrastim-jmdb (FULPHILA) injection 6  mg, 6 mg, Subcutaneous,  Once, 4 of 4 cycles ?Administration: 6 mg (11/14/2019), 6 mg (12/05/2019), 6 mg (12/26/2019), 6 mg (01/16/2020) ?cyclophosphamide (CYTOXAN) 1,000 mg  in sodium chloride 0.9 % 250 mL chemo infusion, 980 mg, Intravenous,  Once, 4 of 4 cycles ?Administration: 1,000 mg (11/13/2019), 1,000 mg (12/04/2019), 1,000 mg (12/25/2019), 1,000 mg (01/15/2020) ?DOCEtaxel (TAXOTERE) 100 mg in sodium chloride 0.9 % 250 mL chemo infusion, 60 mg/m2 = 100 mg (100 % of original dose 60 mg/m2), Intravenous,  Once, 4 of 4 cycles ?Dose modification: 60 mg/m2 (original dose 60 mg/m2, Cycle 1, Reason: Provider Judgment) ?Administration: 100 mg (11/13/2019), 100 mg (12/04/2019), 100 mg (12/25/2019), 100 mg (01/15/2020) ? ? for chemotherapy treatment.  ? ?  ? ? ? ?HISTORY OF PRESENTING ILLNESS:  ?Jessica Barnes 61 y.o.  female with of breast cancer  [T2N1-ER/PR positive HER-2 negative; T1N1-ER/PR positive HER-2 negative 2 separate primaries] s/p adjuvant chemotherapy; also s/p adjuvant radiation letrozole is here for follow-up. ? ?Patient denies any worsening joint pains or fatigue.  Denies any palpitations.  No nausea no vomiting.  No headaches.  Chronic mild fatigue. ? ?Review of Systems  ?Constitutional:  Positive for malaise/fatigue. Negative for chills, diaphoresis, fever and weight loss.  ?HENT:  Negative for nosebleeds and sore throat.   ?Eyes:  Negative for double vision.  ?Respiratory:  Negative for cough, hemoptysis, sputum production and wheezing.   ?Cardiovascular:  Negative for chest pain, palpitations, orthopnea and leg swelling.  ?Gastrointestinal:  Negative for abdominal pain, blood in stool, constipation, heartburn, melena, nausea and vomiting.  ?Genitourinary:  Negative for dysuria, frequency and urgency.  ?Musculoskeletal:  Negative for back pain and joint pain.  ?Neurological:  Negative for dizziness, tingling, focal weakness and weakness.  ?Endo/Heme/Allergies:  Does not bruise/bleed easily.  ?Psychiatric/Behavioral:  The patient is nervous/anxious and has insomnia.    ? ?MEDICAL HISTORY:  ?Past Medical History:  ?Diagnosis Date  ? Anxiety 10/13/96  ? Chest pain   ?  atypical, hosp for that and depression 8/10-8/11/08  ? COPD (chronic obstructive pulmonary disease) ([place]) 09/15/05  ? by x-ray  ? Depression 10/13/96  ? hospital 8/10-8/11/08.  notable history: son with TBI after MVA  ? Family history of breast cancer   ? Family history of colon cancer   ? Family history of pancreatic cancer   ? Hyperlipidemia 09/15/98  ? Hypothyroidism 01/14/96  ? Migraine   ? 78 yoa  ? NSVD (normal spontaneous vaginal delivery)   ? x 1  ? Personal history of chemotherapy   ? finished 01/2020  ? Personal history of radiation therapy   ? finished 04/2020  ? PONV (postoperative nausea and vomiting)   ? PTSD (post-traumatic stress disorder)   ? likely PTSD after son's MVA/TBI in 2007, s/p counseling  ? ? ?SURGICAL HISTORY: ?Past Surgical History:  ?Procedure Laterality Date  ? APPENDECTOMY    ? 61 years of age  ? BREAST BIOPSY Left 08/06/2019  ? [place] bx/ ribbon clip/ invasive mammary carcinoma  ? BREAST BIOPSY Left 09/06/2019  ? [place] bx, heart clip, invasive mammary carcinoma   ? BREAST BIOPSY Left 05/11/2021  ? u/s biopsy/ path pending  ? BREAST LUMPECTOMY Left 10/21/2019  ? Inv ductal Ca and DCIS   ? BREAST LUMPECTOMY WITH RADIOACTIVE SEED AND SENTINEL LYMPH NODE BIOPSY Left 10/01/2019  ? Procedure: LEFT BREAST LUMPECTOMY WITH BRACKETED RADIOACTIVE SEEDS AND LEFT AXILLARY SENTINEL LYMPH NODE BIOPSY;  Surgeon: Rolm Bookbinder, MD;  Location: [facility];  Service: General;  Laterality: Left;  ? PORTACATH PLACEMENT Right 10/28/2019  ? Procedure: INSERTION PORT-A-CATH WITH ULTRASOUND GUIDANCE;  Surgeon: Rolm Bookbinder, MD;  Location: [facility];  Service: General;  Laterality: Right;  ? RE-EXCISION OF BREAST LUMPECTOMY Left 10/28/2019  ? Procedure: RE-EXCISION OF LEFT BREAST LUMPECTOMY;  Surgeon: Rolm Bookbinder, MD;  Location: [facility];  Service: General;  Laterality: Left;  ? ? ?SOCIAL HISTORY: ?Social History  ? ?Socioeconomic History  ? Marital status:  Married  ?  Spouse name: Not on file  ? Number of children: Not on file  ? Years of education: Not on file  ? Highest education level: Not on file  ?Occupational History  ? Not on file  ?Tobacco Use  ? [employer]

## 2021-12-01 NOTE — Assessment & Plan Note (Addendum)
#  Left breast cancer-2 separate primaries- A] T2N1-ER/PR positive HER-2 negative;  B] T1N1 ER/PR positive HER-2 negative. STABLE; No evidence of recurrence. ? ? # continue  LETROZOLE 1 pill a day. Tolerating well overall.  ? ?# Chronic hypokalemia/diarrhea-potassium-3.3; on [place]; ? ?# Elevated LFTs-unclear etiology s/p extensive work-up/GI evaluation [2021] alkaline phosphatase-192-overall STABLE; ? ?# BMD- T score= 0.9; continue Vit D [ca-constipation]; STABLE; discused adjuvant Zometa-declined. ? ?# Fatigue/hypothyroidism-improved currently on Synthroid 88 mcg-TSH today 1.7- STABLE.  ? ?# Insomnia/likely secondary to anxiety- on valium improved.  STABLE.  ? ?# DISPOSITION:  ?#follow up in 3 months-MD; labs- cbc/cmp;-Dr.B. ?

## 2021-12-07 ENCOUNTER — Encounter: Payer: Self-pay | Admitting: Internal Medicine

## 2021-12-15 ENCOUNTER — Other Ambulatory Visit: Payer: Self-pay | Admitting: Family Medicine

## 2021-12-15 NOTE — Telephone Encounter (Signed)
Refill request for DIAZEPAM 10 MG TAB ? ?LOV - 07/15/21 ?Next OV - not scheduled ?Last refill - 10/20/21 #30/1 ? ?

## 2021-12-15 NOTE — Telephone Encounter (Signed)
Sent. Thanks.   

## 2021-12-27 ENCOUNTER — Other Ambulatory Visit: Payer: Self-pay | Admitting: Family Medicine

## 2021-12-27 NOTE — Telephone Encounter (Signed)
Refill request for [facility] [phone] ? ?LOV - 07/15/21 ?Next OV - not scheduled ?Last refill - 09/30/21 #90/2 ? ?

## 2022-01-07 ENCOUNTER — Other Ambulatory Visit: Payer: Self-pay | Admitting: Internal Medicine

## 2022-01-07 ENCOUNTER — Other Ambulatory Visit: Payer: Self-pay | Admitting: Family Medicine

## 2022-01-07 ENCOUNTER — Other Ambulatory Visit: Payer: Self-pay | Admitting: *Deleted

## 2022-01-07 MED ORDER — LETROZOLE 2.5 MG PO TABS: 2.5000 mg | ORAL_TABLET | Freq: Every day | ORAL | 1 refills | Status: DC

## 2022-01-07 NOTE — Telephone Encounter (Signed)
Call from [facility] pharmacist at [facility] asking about Letrozole dose, patient states that she is taking a full tablet instead of half and so she needs new prescription for full tablet sent to pharmacy.  I called and spoke with patient regarding this and she states that she has in fact been taking a full tablet the entire time and is tolerating it well. She states that she did discuss this with Dr B at her last visit. Please advise if she is to take a whole tablet or 1/2 tablet

## 2022-01-07 NOTE — Telephone Encounter (Signed)
Component Ref Range & Units 1 mo ago (12/01/21) 3 mo ago (10/04/21) 7 mo ago (06/11/21) 10 mo ago (03/12/21) 11 mo ago (01/18/21) 1 yr ago (10/26/20) 1 yr ago (07/27/20)  Potassium 3.5 - 5.1 mmol/L 3.3 Low   3.7  4.2  3.4 Low   3.7  4.0  3.6

## 2022-02-16 ENCOUNTER — Other Ambulatory Visit: Payer: Self-pay | Admitting: Family Medicine

## 2022-02-16 NOTE — Telephone Encounter (Signed)
Refill request Diazepam Last refill 12/15/21 #30/1 Last office visit 07/18/21

## 2022-02-16 NOTE — Telephone Encounter (Signed)
Sent. Thanks.   

## 2022-03-02 ENCOUNTER — Inpatient Hospital Stay: Payer: BC Managed Care – PPO | Attending: Internal Medicine

## 2022-03-02 ENCOUNTER — Inpatient Hospital Stay (HOSPITAL_BASED_OUTPATIENT_CLINIC_OR_DEPARTMENT_OTHER): Payer: BC Managed Care – PPO | Admitting: Internal Medicine

## 2022-03-02 ENCOUNTER — Encounter: Payer: Self-pay | Admitting: Internal Medicine

## 2022-03-02 VITALS — BP 101/53 | HR 106 | Temp 99.0°F | Resp 16 | Wt 125.8 lb

## 2022-03-02 DIAGNOSIS — F4310 Post-traumatic stress disorder, unspecified: Secondary | ICD-10-CM | POA: Diagnosis not present

## 2022-03-02 DIAGNOSIS — Z803 Family history of malignant neoplasm of breast: Secondary | ICD-10-CM | POA: Insufficient documentation

## 2022-03-02 DIAGNOSIS — Z79811 Long term (current) use of aromatase inhibitors: Secondary | ICD-10-CM | POA: Diagnosis not present

## 2022-03-02 DIAGNOSIS — E039 Hypothyroidism, unspecified: Secondary | ICD-10-CM | POA: Diagnosis not present

## 2022-03-02 DIAGNOSIS — R5383 Other fatigue: Secondary | ICD-10-CM | POA: Insufficient documentation

## 2022-03-02 DIAGNOSIS — Z79899 Other long term (current) drug therapy: Secondary | ICD-10-CM | POA: Insufficient documentation

## 2022-03-02 DIAGNOSIS — J449 Chronic obstructive pulmonary disease, unspecified: Secondary | ICD-10-CM | POA: Diagnosis not present

## 2022-03-02 DIAGNOSIS — R948 Abnormal results of function studies of other organs and systems: Secondary | ICD-10-CM | POA: Insufficient documentation

## 2022-03-02 DIAGNOSIS — Z923 Personal history of irradiation: Secondary | ICD-10-CM | POA: Diagnosis not present

## 2022-03-02 DIAGNOSIS — R197 Diarrhea, unspecified: Secondary | ICD-10-CM | POA: Diagnosis not present

## 2022-03-02 DIAGNOSIS — F17210 Nicotine dependence, cigarettes, uncomplicated: Secondary | ICD-10-CM | POA: Insufficient documentation

## 2022-03-02 DIAGNOSIS — G4700 Insomnia, unspecified: Secondary | ICD-10-CM | POA: Insufficient documentation

## 2022-03-02 DIAGNOSIS — E876 Hypokalemia: Secondary | ICD-10-CM | POA: Insufficient documentation

## 2022-03-02 DIAGNOSIS — C50412 Malignant neoplasm of upper-outer quadrant of left female breast: Secondary | ICD-10-CM | POA: Insufficient documentation

## 2022-03-02 DIAGNOSIS — Z800 Family history of malignant neoplasm of digestive organs: Secondary | ICD-10-CM | POA: Diagnosis not present

## 2022-03-02 DIAGNOSIS — Z170 Estrogen receptor positive status [ER+]: Secondary | ICD-10-CM

## 2022-03-02 DIAGNOSIS — E785 Hyperlipidemia, unspecified: Secondary | ICD-10-CM | POA: Insufficient documentation

## 2022-03-02 DIAGNOSIS — Z9221 Personal history of antineoplastic chemotherapy: Secondary | ICD-10-CM | POA: Insufficient documentation

## 2022-03-02 LAB — COMPREHENSIVE METABOLIC PANEL WITH GFR
ALT: 19 U/L (ref 0–44)
AST: 25 U/L (ref 15–41)
Albumin: 4.2 g/dL (ref 3.5–5.0)
Alkaline Phosphatase: 156 U/L — ABNORMAL HIGH (ref 38–126)
Anion gap: 10 (ref 5–15)
BUN: 12 mg/dL (ref 8–23)
CO2: 21 mmol/L — ABNORMAL LOW (ref 22–32)
Calcium: 9.2 mg/dL (ref 8.9–10.3)
Chloride: 106 mmol/L (ref 98–111)
Creatinine, Ser: 1.1 mg/dL — ABNORMAL HIGH (ref 0.44–1.00)
GFR, Estimated: 57 mL/min — ABNORMAL LOW
Glucose, Bld: 105 mg/dL — ABNORMAL HIGH (ref 70–99)
Potassium: 3.7 mmol/L (ref 3.5–5.1)
Sodium: 137 mmol/L (ref 135–145)
Total Bilirubin: 0.8 mg/dL (ref 0.3–1.2)
Total Protein: 7.5 g/dL (ref 6.5–8.1)

## 2022-03-02 LAB — CBC WITH DIFFERENTIAL/PLATELET
Abs Immature Granulocytes: 0.03 10*3/uL (ref 0.00–0.07)
Basophils Absolute: 0.1 10*3/uL (ref 0.0–0.1)
Basophils Relative: 1 %
Eosinophils Absolute: 0.2 10*3/uL (ref 0.0–0.5)
Eosinophils Relative: 2 %
HCT: 40.5 % (ref 36.0–46.0)
Hemoglobin: 13.1 g/dL (ref 12.0–15.0)
Immature Granulocytes: 0 %
Lymphocytes Relative: 15 %
Lymphs Abs: 1.3 10*3/uL (ref 0.7–4.0)
MCH: 30.9 pg (ref 26.0–34.0)
MCHC: 32.3 g/dL (ref 30.0–36.0)
MCV: 95.5 fL (ref 80.0–100.0)
Monocytes Absolute: 0.6 10*3/uL (ref 0.1–1.0)
Monocytes Relative: 7 %
Neutro Abs: 6.4 10*3/uL (ref 1.7–7.7)
Neutrophils Relative %: 75 %
Platelets: 336 10*3/uL (ref 150–400)
RBC: 4.24 MIL/uL (ref 3.87–5.11)
RDW: 13.1 % (ref 11.5–15.5)
WBC: 8.5 10*3/uL (ref 4.0–10.5)
nRBC: 0 % (ref 0.0–0.2)

## 2022-03-02 NOTE — Assessment & Plan Note (Addendum)
#  Left breast cancer-2 separate primaries- A] T2N1-ER/PR positive HER-2 negative;  B] T1N1 ER/PR positive HER-2 negative. S/p Lumpectomy-RT. STABLE; No evidence of recurrence.  Bilateral MRI OCT 2022- WNL; repeat Mammogram-OCT 2023.    # continue  LETROZOLE 1 pill a day. Tolerating well overall.  Continue the same.  # Chronic hypokalemia/diarrhea-potassium-3.7; on [place]  # Elevated LFTs-unclear etiology s/p extensive work-up/GI evaluation [2021] alkaline phosphatase-152-overall STABLE;  # BMD- T score= 0.9; continue Vit D [ca-constipation]; STABLE  # Weight loss: intentional; healthy life style; congratulated patient.  # Fatigue/hypothyroidism-improved currently on Synthroid- followed by PCP.   # Insomnia/likely secondary to anxiety- on valium improved.  STABLE.   # DISPOSITION:  #follow up in 3 months-MD; labs- cbc/cmp; thyroid profile; prior- Bil diagnostic mammogram-Dr.B.

## 2022-03-02 NOTE — Progress Notes (Signed)
Has a good appetite with 14 lb wt loss since 11/2021 but also states she has changed her eating habits in hopes of not gaining more weight.

## 2022-03-02 NOTE — Progress Notes (Signed)
[facility] NOTE  Patient Care Team: Tonia Ghent, MD as PCP - General (Family Medicine) Cammie Sickle, MD as Consulting Physician (Internal Medicine) Rolm Bookbinder, MD as Consulting Physician (General Surgery) Rico Junker, [name]  #  Oncology History Overview Note  # DEC 2020- LEFT BREAST-TWO PRIM ARIES:   #A]-IMC; ER/PR >90%; Her 2-IHC-2+; FISH-NEG-2 ; MRI- 3.7 CM; s/p s/p lumpectomy; s/p reexcision-final margins negative.  Stage Ib.    #B] Bx- IMC; G-1; ER/PR-pos; her2 NEG. s/p lumpectomy; margins negative [as per Dr.Wakefield; discussed at [facility] tumor conference]  # MARCH 31st 2021- START Taxotere [60 mg/m]; Cytoxan; growth factor x4 cycles; s/p radiation 04/29/2020  # MID-OCT 2021- Start anastrozole; MID MARCH 2022- stopped sec to intolerance; MID April 20222- START AROMASIN; STOPPED in OCT, 2022 [extreme fatigue heart fluttering]. FEB 22nd, 2023- Letrozole half a pill  #Extreme anxiety/chronic diarrhea/chronic severe hypokalemia/history of migraines/depression  Used estrogen and progesterone therapy: on premarin for post- menopausal x 2-3 years- discontinued in JAN 2021.   # SURVIVORSHIP:   DIAGNOSIS: Breast cancer  STAGE: IB & I     ;  GOALS: Cure     Carcinoma of upper-outer quadrant of left breast in female, estrogen receptor positive ([place])  08/14/2019 Initial Diagnosis   Carcinoma of upper-outer quadrant of left breast in female, estrogen receptor positive ([place])   09/10/2019 Genetic Testing   Negative genetic testing on the STAT and common hereditary cancer panel.  The Common Hereditary Gene Panel offered by [facility] includes sequencing and/or deletion duplication testing of the following 48 genes: APC, ATM, AXIN2, BARD1, BMPR1A, BRCA1, BRCA2, BRIP1, CDH1, CDK4, CDKN2A (p14ARF), CDKN2A (p16INK4a), CHEK2, CTNNA1, DICER1, EPCAM (Deletion/duplication  testing only), GREM1 (promoter region deletion/duplication testing only), KIT, MEN1, MLH1, MSH2, MSH3, MSH6, MUTYH, NBN, NF1, NHTL1, PALB2, PDGFRA, PMS2, POLD1, POLE, PTEN, RAD50, RAD51C, RAD51D, RNF43, SDHB, SDHC, SDHD, SMAD4, SMARCA4. STK11, TP53, TSC1, TSC2, and VHL.  The following genes were evaluated for sequence changes only: SDHA and HOXB13 c.251G>A variant only. The report date is September 10, 2019.   10/03/2019 Cancer Staging   Staging form: Breast, AJCC 8th Edition - Pathologic stage from 10/03/2019: Stage IB (pT2, pN1a, cM0, G2, ER+, PR+, HER2-) - Signed by Gardenia Phlegm, NP on 10/16/2019   10/09/2019 - 10/09/2019 Chemotherapy   The patient had dexamethasone (DECADRON) 4 MG tablet, 0 of 1 cycle, Start date: --, End date: -- DOXOrubicin (ADRIAMYCIN) chemo injection 98 mg, 60 mg/m2, Intravenous,  Once, 0 of 4 cycles PALONOSETRON HCL INJECTION 0.25 MG/5ML, 0.25 mg, Intravenous,  Once, 0 of 4 cycles pegfilgrastim-jmdb (FULPHILA) injection 6 mg, 6 mg, Subcutaneous,  Once, 0 of 4 cycles cyclophosphamide (CYTOXAN) 980 mg in sodium chloride 0.9 % 250 mL chemo infusion, 600 mg/m2, Intravenous,  Once, 0 of 4 cycles PACLitaxel (TAXOL) 132 mg in sodium chloride 0.9 % 250 mL chemo infusion (</= 10m/m2), 80 mg/m2, Intravenous,  Once, 0 of 12 cycles FOSAPREPITANT IV INFUSION 150 MG, 150 mg, Intravenous,  Once, 0 of 4 cycles  for chemotherapy treatment.    11/13/2019 -  Chemotherapy   The patient had dexamethasone (DECADRON) 4 MG tablet, 8 mg, Oral, 2 times daily, 1 of 1 cycle, Start date: --, End date: -- palonosetron (ALOXI) injection 0.25 mg, 0.25 mg, Intravenous,  Once, 4 of 4 cycles Administration: 0.25 mg (11/13/2019), 0.25 mg (12/04/2019), 0.25 mg (12/25/2019), 0.25 mg (01/15/2020) pegfilgrastim-jmdb (FULPHILA) injection 6 mg, 6 mg,  Subcutaneous,  Once, 4 of 4 cycles Administration: 6 mg (11/14/2019), 6 mg (12/05/2019), 6 mg (12/26/2019), 6 mg (01/16/2020) cyclophosphamide (CYTOXAN) 1,000 mg in  sodium chloride 0.9 % 250 mL chemo infusion, 980 mg, Intravenous,  Once, 4 of 4 cycles Administration: 1,000 mg (11/13/2019), 1,000 mg (12/04/2019), 1,000 mg (12/25/2019), 1,000 mg (01/15/2020) DOCEtaxel (TAXOTERE) 100 mg in sodium chloride 0.9 % 250 mL chemo infusion, 60 mg/m2 = 100 mg (100 % of original dose 60 mg/m2), Intravenous,  Once, 4 of 4 cycles Dose modification: 60 mg/m2 (original dose 60 mg/m2, Cycle 1, Reason: Provider Judgment) Administration: 100 mg (11/13/2019), 100 mg (12/04/2019), 100 mg (12/25/2019), 100 mg (01/15/2020)  for chemotherapy treatment.       HISTORY OF PRESENTING ILLNESS: Alone.  Ambulating independently.  Jessica Barnes 61 y.o.  female with of breast cancer  [T2N1-ER/PR positive HER-2 negative; T1N1-ER/PR positive HER-2 negative 2 separate primaries] s/p adjuvant chemotherapy; also s/p adjuvant radiation letrozole is here for follow-up.  Patient states that she has been try to lose weight eating healthy.  She has lost about 25 pounds in the last 6 months.  Patient denies any worsening joint pains or fatigue.  Denies any palpitations.  No nausea no vomiting.  No headaches.  Chronic mild fatigue.  Review of Systems  Constitutional:  Positive for malaise/fatigue. Negative for chills, diaphoresis, fever and weight loss.  HENT:  Negative for nosebleeds and sore throat.   Eyes:  Negative for double vision.  Respiratory:  Negative for cough, hemoptysis, sputum production and wheezing.   Cardiovascular:  Negative for chest pain, palpitations, orthopnea and leg swelling.  Gastrointestinal:  Negative for abdominal pain, blood in stool, constipation, heartburn, melena, nausea and vomiting.  Genitourinary:  Negative for dysuria, frequency and urgency.  Musculoskeletal:  Negative for back pain and joint pain.  Neurological:  Negative for dizziness, tingling, focal weakness and weakness.  Endo/Heme/Allergies:  Does not bruise/bleed easily.  Psychiatric/Behavioral:   The patient is nervous/anxious and has insomnia.      MEDICAL HISTORY:  Past Medical History:  Diagnosis Date  . Anxiety 10/13/96  . Chest pain    atypical, hosp for that and depression 8/10-8/11/08  . COPD (chronic obstructive pulmonary disease) ([place]) 09/15/05   by x-ray  . Depression 10/13/96   hospital 8/10-8/11/08.  notable history: son with TBI after MVA  . Family history of breast cancer   . Family history of colon cancer   . Family history of pancreatic cancer   . Hyperlipidemia 09/15/98  . Hypothyroidism 01/14/96  . Migraine    27 yoa  . NSVD (normal spontaneous vaginal delivery)    x 1  . Personal history of chemotherapy    finished 01/2020  . Personal history of radiation therapy    finished 04/2020  . PONV (postoperative nausea and vomiting)   . PTSD (post-traumatic stress disorder)    likely PTSD after son's MVA/TBI in 2007, s/p counseling    SURGICAL HISTORY: Past Surgical History:  Procedure Laterality Date  . APPENDECTOMY     61 years of age  . BREAST BIOPSY Left 08/06/2019   [place] bx/ ribbon clip/ invasive mammary carcinoma  . BREAST BIOPSY Left 09/06/2019   [place] bx, heart clip, invasive mammary carcinoma   . BREAST BIOPSY Left 05/11/2021   u/s biopsy/ path pending  . BREAST LUMPECTOMY Left 10/21/2019   Inv ductal Ca and DCIS   . BREAST LUMPECTOMY WITH RADIOACTIVE SEED AND SENTINEL LYMPH NODE BIOPSY Left 10/01/2019   Procedure:  LEFT BREAST LUMPECTOMY WITH BRACKETED RADIOACTIVE SEEDS AND LEFT AXILLARY SENTINEL LYMPH NODE BIOPSY;  Surgeon: Rolm Bookbinder, MD;  Location: [facility];  Service: General;  Laterality: Left;  . PORTACATH PLACEMENT Right 10/28/2019   Procedure: INSERTION PORT-A-CATH WITH ULTRASOUND GUIDANCE;  Surgeon: Rolm Bookbinder, MD;  Location: [facility];  Service: General;  Laterality: Right;  . RE-EXCISION OF BREAST LUMPECTOMY Left 10/28/2019   Procedure: RE-EXCISION OF LEFT BREAST LUMPECTOMY;  Surgeon: Rolm Bookbinder, MD;  Location: [facility];  Service: General;  Laterality: Left;    SOCIAL HISTORY: Social History   Socioeconomic History  . Marital status: Married    Spouse name: Not on file  . Number of children: Not on file  . Years of education: Not on file  . Highest education level: Not on file  Occupational History  . Not on file  Tobacco Use  . Smoking status: Former    Packs/day: 0.50    Years: 8.00    Total pack years: 4.00    Types: Cigarettes  . Smokeless tobacco: Never  . Tobacco comments:    quit 04/15/2014  Vaping Use  . Vaping Use: Every day  Substance and Sexual Activity  . Alcohol use: No    Alcohol/week: 0.0 standard drinks of alcohol  . Drug use: No  . Sexual activity: Not on file  Other Topics Concern  . Not on file  Social History Narrative   Remarried, lives with husband 1 son, he had TBI and short term memory loss after prolonged illness      #Patient lives in [place] with her husband; prior history of smoking/currently vapes.  No significant alcohol use.  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    Social Determinants of Health   Financial Resource Strain: Not on file  Food Insecurity: Not on file  Transportation Needs: Not on file  Physical Activity: Not on file  Stress: Not on file  Social Connections: Not on file  Intimate Partner Violence: Not on file    FAMILY HISTORY: Family History  Problem Relation Age of Onset  . Hypertension Mother   . Colon cancer Father        d. 39  . Thyroid disease Sister   . Breast cancer Paternal Aunt   . Breast cancer Paternal Grandmother   . Colon cancer Paternal Grandmother   . Breast cancer Cousin        paternal aunt's daughter  . Colon cancer Paternal Uncle        3 pat uncles with colon cancer at unknown ages  . Pancreatic cancer Maternal Uncle        d. 53  . Breast cancer Paternal Aunt     ALLERGIES:  is allergic to sulfamethoxazole-trimethoprim and codeine.  MEDICATIONS:   Current Outpatient Medications  Medication Sig Dispense Refill  . butalbital-acetaminophen-caffeine (FIORICET) 50-325-40 MG tablet TAKE 1 TABLET BY MOUTH EVERY 4 TO 6 HOURS AS NEEDED FOR HEADACHE 90 tablet 2  . Calcium Carbonate-Vit D-Min (CALCIUM 1200 PO) Take 1 capsule by mouth daily.    . diazepam (VALIUM) 10 MG tablet TAKE 1 TABLET BY MOUTH EVERY NIGHT AT BEDTIME AS NEEDED FOR ANXIETY. 30 tablet 1  . FLUoxetine (PROZAC) 20 MG capsule TAKE 3 CAPSULES BY MOUTH DAILY 270 capsule 0  . ibuprofen (ADVIL) 100 MG tablet Take 100 mg by mouth every 6 (six) hours as needed for fever.     Marland Kitchen letrozole (FEMARA) 2.5 MG tablet Take 1  tablet (2.5 mg total) by mouth daily. 90 tablet 1  . levothyroxine (SYNTHROID) 88 MCG tablet Take 1 tablet (88 mcg total) by mouth daily. Except for 0.5 tablet on Sundays.  6.5 tablets/week. 90 tablet 1  . loperamide (IMODIUM A-D) 2 MG tablet Take 4 mg by mouth 4 (four) times daily as needed for diarrhea or loose stools.     . phenazopyridine (PYRIDIUM) 200 MG tablet Take 1 tablet (200 mg total) by mouth 3 (three) times daily as needed for pain. 10 tablet 0  . potassium chloride SA (KLOR-CON M) 20 MEQ tablet Take 1 tablet (20 mEq total) by mouth daily. 90 tablet 1  . diphenoxylate-atropine (LOMOTIL) 2.5-0.025 MG tablet TAKE 1 TABLET BY MOUTH 4 TIMES A DAY AS NEEDED FOR DIARHEA OR LOOSE STOOLS. TAKE WITH IMMODIUM (Patient not taking: Reported on 12/01/2021) 30 tablet 0  . tiZANidine (ZANAFLEX) 4 MG tablet Take 1 tablet (4 mg total) by mouth every 6 (six) hours as needed for muscle spasms. (Patient not taking: Reported on 12/01/2021) 30 tablet 0   No current facility-administered medications for this visit.      Marland Kitchen  PHYSICAL EXAMINATION: ECOG PERFORMANCE STATUS: 0 - Asymptomatic  Vitals:   03/02/22 1400  BP: (!) 101/53  Pulse: (!) 106  Resp: 16  Temp: 99 F (37.2 C)   Filed Weights   03/02/22 1400  Weight: 125 lb 12.8 oz (57.1 kg)    Physical Exam Constitutional:       Comments: Accompanied by her sister.  Ambulating independently.  HENT:     Head: Normocephalic and atraumatic.     Mouth/Throat:     Pharynx: No oropharyngeal exudate.  Eyes:     Pupils: Pupils are equal, round, and reactive to light.  Cardiovascular:     Rate and Rhythm: Normal rate and regular rhythm.  Pulmonary:     Effort: Pulmonary effort is normal. No respiratory distress.     Breath sounds: Normal breath sounds. No wheezing.  Abdominal:     General: Bowel sounds are normal. There is no distension.     Palpations: Abdomen is soft. There is no mass.     Tenderness: There is no abdominal tenderness. There is no guarding or rebound.  Musculoskeletal:        General: No tenderness. Normal range of motion.     Cervical back: Normal range of motion and neck supple.  Skin:    General: Skin is warm.     Comments: Mild left underarm swelling/chest wall swelling.  Neurological:     Mental Status: She is alert and oriented to person, place, and time.  Psychiatric:        Mood and Affect: Affect normal.     Comments: Anxious   LABORATORY DATA:  I have reviewed the data as listed Lab Results  Component Value Date   WBC 8.5 03/02/2022   HGB 13.1 03/02/2022   HCT 40.5 03/02/2022   MCV 95.5 03/02/2022   PLT 336 03/02/2022   Recent Labs    10/04/21 1453 12/01/21 1356 03/02/22 1428  NA 135 135 137  K 3.7 3.3* 3.7  CL 106 103 106  CO2 21* 23 21*  GLUCOSE 107* 117* 105*  BUN 12 10 12   CREATININE 0.96 1.03* 1.10*  CALCIUM 8.6* 9.1 9.2  GFRNONAA >60 >60 57*  PROT 7.0 7.7 7.5  ALBUMIN 3.7 4.1 4.2  AST 30 26 25   ALT 29 31 19   ALKPHOS 155* 191* 156*  BILITOT <0.1*  0.4 0.8    RADIOGRAPHIC STUDIES: I have personally reviewed the radiological images as listed and agreed with the findings in the report. No results found.  ASSESSMENT & PLAN:   Carcinoma of upper-outer quadrant of left breast in female, estrogen receptor positive ([place]) # Left breast cancer-2 separate  primaries- A] T2N1-ER/PR positive HER-2 negative;  B] T1N1 ER/PR positive HER-2 negative. S/p Lumpectomy-RT. STABLE; No evidence of recurrence.  Bilateral MRI OCT 2022- WNL; repeat Mammogram-OCT 2023.    # continue  LETROZOLE 1 pill a day. Tolerating well overall.  Continue the same.  # Chronic hypokalemia/diarrhea-potassium-3.7; on [place]  # Elevated LFTs-unclear etiology s/p extensive work-up/GI evaluation [2021] alkaline phosphatase-152-overall STABLE;  # BMD- T score= 0.9; continue Vit D [ca-constipation]; STABLE  # Weight loss: intentional; healthy life style; congratulated patient.  # Fatigue/hypothyroidism-improved currently on Synthroid- followed by PCP.   # Insomnia/likely secondary to anxiety- on valium improved.  STABLE.   # DISPOSITION:  #follow up in 3 months-MD; labs- cbc/cmp; thyroid profile; prior- Bil diagnostic mammogram-Dr.B.  All questions were answered. The patient/family knows to call the clinic with any problems, questions or concerns.    Cammie Sickle, MD 03/02/2022 3:31 PM

## 2022-03-07 ENCOUNTER — Other Ambulatory Visit: Payer: Self-pay

## 2022-03-15 ENCOUNTER — Other Ambulatory Visit: Payer: Self-pay

## 2022-03-16 DIAGNOSIS — R7401 Elevation of levels of liver transaminase levels: Secondary | ICD-10-CM | POA: Diagnosis not present

## 2022-03-16 DIAGNOSIS — F32A Depression, unspecified: Secondary | ICD-10-CM | POA: Diagnosis not present

## 2022-03-16 DIAGNOSIS — R1013 Epigastric pain: Secondary | ICD-10-CM | POA: Diagnosis not present

## 2022-03-16 DIAGNOSIS — R072 Precordial pain: Secondary | ICD-10-CM | POA: Diagnosis not present

## 2022-03-16 DIAGNOSIS — Z792 Long term (current) use of antibiotics: Secondary | ICD-10-CM | POA: Diagnosis not present

## 2022-03-16 DIAGNOSIS — Z923 Personal history of irradiation: Secondary | ICD-10-CM | POA: Diagnosis not present

## 2022-03-16 DIAGNOSIS — R079 Chest pain, unspecified: Secondary | ICD-10-CM | POA: Diagnosis not present

## 2022-03-16 DIAGNOSIS — Z9221 Personal history of antineoplastic chemotherapy: Secondary | ICD-10-CM | POA: Diagnosis not present

## 2022-03-16 DIAGNOSIS — K831 Obstruction of bile duct: Secondary | ICD-10-CM | POA: Diagnosis not present

## 2022-03-16 DIAGNOSIS — K838 Other specified diseases of biliary tract: Secondary | ICD-10-CM | POA: Diagnosis not present

## 2022-03-16 DIAGNOSIS — K047 Periapical abscess without sinus: Secondary | ICD-10-CM | POA: Diagnosis not present

## 2022-03-16 DIAGNOSIS — Z79811 Long term (current) use of aromatase inhibitors: Secondary | ICD-10-CM | POA: Diagnosis not present

## 2022-03-16 DIAGNOSIS — E039 Hypothyroidism, unspecified: Secondary | ICD-10-CM | POA: Diagnosis not present

## 2022-03-16 DIAGNOSIS — R1011 Right upper quadrant pain: Secondary | ICD-10-CM | POA: Diagnosis not present

## 2022-03-16 DIAGNOSIS — E785 Hyperlipidemia, unspecified: Secondary | ICD-10-CM | POA: Diagnosis not present

## 2022-03-16 DIAGNOSIS — G43909 Migraine, unspecified, not intractable, without status migrainosus: Secondary | ICD-10-CM | POA: Diagnosis not present

## 2022-03-16 DIAGNOSIS — K828 Other specified diseases of gallbladder: Secondary | ICD-10-CM | POA: Diagnosis not present

## 2022-03-16 DIAGNOSIS — R7989 Other specified abnormal findings of blood chemistry: Secondary | ICD-10-CM | POA: Diagnosis not present

## 2022-03-16 DIAGNOSIS — C50912 Malignant neoplasm of unspecified site of left female breast: Secondary | ICD-10-CM | POA: Diagnosis not present

## 2022-03-16 DIAGNOSIS — Z87891 Personal history of nicotine dependence: Secondary | ICD-10-CM | POA: Diagnosis not present

## 2022-03-16 DIAGNOSIS — Z853 Personal history of malignant neoplasm of breast: Secondary | ICD-10-CM | POA: Diagnosis not present

## 2022-03-17 DIAGNOSIS — E039 Hypothyroidism, unspecified: Secondary | ICD-10-CM | POA: Diagnosis not present

## 2022-03-17 DIAGNOSIS — R7401 Elevation of levels of liver transaminase levels: Secondary | ICD-10-CM | POA: Diagnosis not present

## 2022-03-17 DIAGNOSIS — G43909 Migraine, unspecified, not intractable, without status migrainosus: Secondary | ICD-10-CM | POA: Diagnosis not present

## 2022-03-17 DIAGNOSIS — R079 Chest pain, unspecified: Secondary | ICD-10-CM | POA: Diagnosis not present

## 2022-03-17 DIAGNOSIS — R1011 Right upper quadrant pain: Secondary | ICD-10-CM | POA: Diagnosis not present

## 2022-03-18 DIAGNOSIS — R945 Abnormal results of liver function studies: Secondary | ICD-10-CM | POA: Diagnosis not present

## 2022-03-18 DIAGNOSIS — R1011 Right upper quadrant pain: Secondary | ICD-10-CM | POA: Diagnosis not present

## 2022-03-18 DIAGNOSIS — K047 Periapical abscess without sinus: Secondary | ICD-10-CM | POA: Diagnosis not present

## 2022-03-28 ENCOUNTER — Inpatient Hospital Stay: Admission: RE | Admit: 2022-03-28 | Payer: BC Managed Care – PPO | Source: Ambulatory Visit

## 2022-03-29 ENCOUNTER — Other Ambulatory Visit: Payer: Self-pay | Admitting: Family Medicine

## 2022-03-30 NOTE — Telephone Encounter (Signed)
Sent. Thanks.   

## 2022-03-30 NOTE — Telephone Encounter (Signed)
Refill request for [facility] [phone]  LOV - 07/15/21 Next OV - not scheduled Last refill - 12/28/21 #90/2

## 2022-04-20 ENCOUNTER — Other Ambulatory Visit: Payer: Self-pay | Admitting: Internal Medicine

## 2022-04-20 ENCOUNTER — Other Ambulatory Visit: Payer: Self-pay | Admitting: Family Medicine

## 2022-04-20 DIAGNOSIS — Z853 Personal history of malignant neoplasm of breast: Secondary | ICD-10-CM

## 2022-04-20 DIAGNOSIS — Z1231 Encounter for screening mammogram for malignant neoplasm of breast: Secondary | ICD-10-CM

## 2022-04-20 NOTE — Telephone Encounter (Signed)
Refill request Fluoxetine LR 01/07/22 #270 Diazepam LR 02/16/22 #30/1 Last office visit 07/15/21 No upcoming appointment scheduled

## 2022-04-20 NOTE — Telephone Encounter (Signed)
Okay to continue.  Rx sent x2.

## 2022-05-13 ENCOUNTER — Ambulatory Visit
Admission: RE | Admit: 2022-05-13 | Discharge: 2022-05-13 | Disposition: A | Discharge status: Home / Self Care | Payer: BC Managed Care – PPO | Source: Ambulatory Visit | Attending: Internal Medicine | Admitting: Internal Medicine

## 2022-05-13 DIAGNOSIS — Z170 Estrogen receptor positive status [ER+]: Secondary | ICD-10-CM | POA: Diagnosis not present

## 2022-05-13 DIAGNOSIS — Z1231 Encounter for screening mammogram for malignant neoplasm of breast: Secondary | ICD-10-CM | POA: Insufficient documentation

## 2022-05-13 DIAGNOSIS — C50412 Malignant neoplasm of upper-outer quadrant of left female breast: Secondary | ICD-10-CM | POA: Insufficient documentation

## 2022-05-13 DIAGNOSIS — Z853 Personal history of malignant neoplasm of breast: Secondary | ICD-10-CM | POA: Insufficient documentation

## 2022-05-13 DIAGNOSIS — R928 Other abnormal and inconclusive findings on diagnostic imaging of breast: Secondary | ICD-10-CM | POA: Diagnosis not present

## 2022-05-24 ENCOUNTER — Other Ambulatory Visit: Payer: Self-pay | Admitting: Family Medicine

## 2022-05-24 DIAGNOSIS — E039 Hypothyroidism, unspecified: Secondary | ICD-10-CM

## 2022-05-24 NOTE — Telephone Encounter (Signed)
Last TSH labs were done 10/04/21; okay to refill?

## 2022-05-25 NOTE — Telephone Encounter (Signed)
Please call patient to schedule lab appt to check TSH

## 2022-05-25 NOTE — Telephone Encounter (Signed)
Sent but she needs a f/u TSH.  Ordered and sent.  Thanks.

## 2022-06-01 ENCOUNTER — Inpatient Hospital Stay: Payer: BC Managed Care – PPO

## 2022-06-01 ENCOUNTER — Inpatient Hospital Stay: Payer: BC Managed Care – PPO | Admitting: Internal Medicine

## 2022-06-01 NOTE — Assessment & Plan Note (Deleted)
#  Left breast cancer-2 separate primaries- A] T2N1-ER/PR positive HER-2 negative;  B] T1N1 ER/PR positive HER-2 negative. S/p Lumpectomy-RT. STABLE; No evidence of recurrence.  Bilateral MRI OCT 2022- WNL; repeat Mammogram-OCT 2023.    # continue  LETROZOLE 1 pill a day. Tolerating well overall.  Continue the same.  # Chronic hypokalemia/diarrhea-potassium-3.7; on [place]  # Elevated LFTs-unclear etiology s/p extensive work-up/GI evaluation [2021] alkaline phosphatase-152-overall STABLE;  # BMD- T score= 0.9; continue Vit D [ca-constipation]; STABLE  # Weight loss: intentional; healthy life style; congratulated patient.  # Fatigue/hypothyroidism-improved currently on Synthroid- followed by PCP.   # Insomnia/likely secondary to anxiety- on valium improved.  STABLE.   # DISPOSITION:  #follow up in 3 months-MD; labs- cbc/cmp; thyroid profile; prior- Bil diagnostic mammogram-Dr.B.

## 2022-06-21 ENCOUNTER — Other Ambulatory Visit: Payer: Self-pay | Admitting: Family Medicine

## 2022-06-21 NOTE — Telephone Encounter (Signed)
Refill request for DIAZEPAM 10 MG TAB   LOV - 07/15/21 Next OV - not scheduled Last refill - 04/20/22 #30/2

## 2022-06-24 ENCOUNTER — Ambulatory Visit: Payer: BC Managed Care – PPO | Admitting: Internal Medicine

## 2022-06-24 ENCOUNTER — Other Ambulatory Visit: Payer: BC Managed Care – PPO

## 2022-06-27 ENCOUNTER — Other Ambulatory Visit (INDEPENDENT_AMBULATORY_CARE_PROVIDER_SITE_OTHER): Payer: BC Managed Care – PPO

## 2022-06-27 DIAGNOSIS — E039 Hypothyroidism, unspecified: Secondary | ICD-10-CM | POA: Diagnosis not present

## 2022-06-27 LAB — TSH: TSH: 1.12 u[IU]/mL (ref 0.35–5.50)

## 2022-06-29 ENCOUNTER — Other Ambulatory Visit: Payer: Self-pay | Admitting: Family Medicine

## 2022-06-29 NOTE — Telephone Encounter (Signed)
Refill request for [facility] [phone]   LOV - 07/15/21 Next OV - not scheduled Last refill - 03/30/22 #90/2

## 2022-07-20 ENCOUNTER — Other Ambulatory Visit: Payer: Self-pay | Admitting: Family Medicine

## 2022-07-20 ENCOUNTER — Other Ambulatory Visit: Payer: Self-pay | Admitting: Internal Medicine

## 2022-07-20 NOTE — Telephone Encounter (Signed)
Patient is overdue for CPE; please call to schedule appt.

## 2022-07-20 NOTE — Telephone Encounter (Signed)
Refill request for  FLUOXETINE HCL 20 MG CAP   LOV - 07/15/21 Next OV - not scheduled Last refill - 04/20/22 #270/0

## 2022-07-20 NOTE — Telephone Encounter (Signed)
Sent. Thanks.   

## 2022-07-20 NOTE — Telephone Encounter (Signed)
Patient scheduled for CPE  at end of January. She did not schedule lab work due to her just having labs done at the beginning of November.

## 2022-08-10 ENCOUNTER — Other Ambulatory Visit: Payer: Self-pay | Admitting: Internal Medicine

## 2022-08-11 ENCOUNTER — Encounter: Payer: Self-pay | Admitting: Internal Medicine

## 2022-08-11 ENCOUNTER — Inpatient Hospital Stay: Payer: BC Managed Care – PPO | Admitting: Internal Medicine

## 2022-08-11 ENCOUNTER — Inpatient Hospital Stay: Payer: BC Managed Care – PPO | Attending: Internal Medicine

## 2022-08-17 ENCOUNTER — Other Ambulatory Visit: Payer: Self-pay | Admitting: Internal Medicine

## 2022-08-22 ENCOUNTER — Inpatient Hospital Stay: Payer: BC Managed Care – PPO

## 2022-08-22 ENCOUNTER — Inpatient Hospital Stay: Payer: BC Managed Care – PPO | Attending: Internal Medicine | Admitting: Internal Medicine

## 2022-08-22 ENCOUNTER — Encounter: Payer: Self-pay | Admitting: Internal Medicine

## 2022-08-22 VITALS — BP 95/67 | HR 102 | Temp 97.5°F | Resp 18 | Wt 128.4 lb

## 2022-08-22 DIAGNOSIS — E876 Hypokalemia: Secondary | ICD-10-CM | POA: Insufficient documentation

## 2022-08-22 DIAGNOSIS — E785 Hyperlipidemia, unspecified: Secondary | ICD-10-CM | POA: Insufficient documentation

## 2022-08-22 DIAGNOSIS — E039 Hypothyroidism, unspecified: Secondary | ICD-10-CM | POA: Diagnosis not present

## 2022-08-22 DIAGNOSIS — Z79811 Long term (current) use of aromatase inhibitors: Secondary | ICD-10-CM | POA: Insufficient documentation

## 2022-08-22 DIAGNOSIS — Z923 Personal history of irradiation: Secondary | ICD-10-CM | POA: Diagnosis not present

## 2022-08-22 DIAGNOSIS — F17290 Nicotine dependence, other tobacco product, uncomplicated: Secondary | ICD-10-CM | POA: Insufficient documentation

## 2022-08-22 DIAGNOSIS — C50412 Malignant neoplasm of upper-outer quadrant of left female breast: Secondary | ICD-10-CM | POA: Diagnosis not present

## 2022-08-22 DIAGNOSIS — Z170 Estrogen receptor positive status [ER+]: Secondary | ICD-10-CM | POA: Diagnosis not present

## 2022-08-22 DIAGNOSIS — Z79899 Other long term (current) drug therapy: Secondary | ICD-10-CM | POA: Insufficient documentation

## 2022-08-22 DIAGNOSIS — F17210 Nicotine dependence, cigarettes, uncomplicated: Secondary | ICD-10-CM | POA: Diagnosis not present

## 2022-08-22 LAB — CBC WITH DIFFERENTIAL/PLATELET
Abs Immature Granulocytes: 0.03 10*3/uL (ref 0.00–0.07)
Basophils Absolute: 0.1 10*3/uL (ref 0.0–0.1)
Basophils Relative: 1 %
Eosinophils Absolute: 0.2 10*3/uL (ref 0.0–0.5)
Eosinophils Relative: 3 %
HCT: 40.3 % (ref 36.0–46.0)
Hemoglobin: 13.3 g/dL (ref 12.0–15.0)
Immature Granulocytes: 1 %
Lymphocytes Relative: 23 %
Lymphs Abs: 1.4 10*3/uL (ref 0.7–4.0)
MCH: 31.4 pg (ref 26.0–34.0)
MCHC: 33 g/dL (ref 30.0–36.0)
MCV: 95.3 fL (ref 80.0–100.0)
Monocytes Absolute: 0.5 10*3/uL (ref 0.1–1.0)
Monocytes Relative: 8 %
Neutro Abs: 4 10*3/uL (ref 1.7–7.7)
Neutrophils Relative %: 64 %
Platelets: 320 10*3/uL (ref 150–400)
RBC: 4.23 MIL/uL (ref 3.87–5.11)
RDW: 12.4 % (ref 11.5–15.5)
WBC: 6.2 10*3/uL (ref 4.0–10.5)
nRBC: 0 % (ref 0.0–0.2)

## 2022-08-22 LAB — COMPREHENSIVE METABOLIC PANEL
ALT: 62 U/L — ABNORMAL HIGH (ref 0–44)
AST: 58 U/L — ABNORMAL HIGH (ref 15–41)
Albumin: 3.8 g/dL (ref 3.5–5.0)
Alkaline Phosphatase: 183 U/L — ABNORMAL HIGH (ref 38–126)
Anion gap: 8 (ref 5–15)
BUN: 12 mg/dL (ref 8–23)
CO2: 22 mmol/L (ref 22–32)
Calcium: 8.9 mg/dL (ref 8.9–10.3)
Chloride: 104 mmol/L (ref 98–111)
Creatinine, Ser: 0.88 mg/dL (ref 0.44–1.00)
GFR, Estimated: >60 mL/min (ref >60)
Glucose, Bld: 138 mg/dL — ABNORMAL HIGH (ref 70–99)
Potassium: 3.8 mmol/L (ref 3.5–5.1)
Sodium: 134 mmol/L — ABNORMAL LOW (ref 135–145)
Total Bilirubin: 0.4 mg/dL (ref 0.3–1.2)
Total Protein: 7 g/dL (ref 6.5–8.1)

## 2022-08-22 MED ORDER — LETROZOLE 2.5 MG PO TABS: 2.5000 mg | ORAL_TABLET | Freq: Every day | ORAL | 1 refills | Status: DC

## 2022-08-22 NOTE — Progress Notes (Signed)
Patient here today for follow up regarding breast cancer. Patient denies concerns today. Patient is tolerating letrozole well, reports hot flashes at times. Patient does need refill for letrozole.

## 2022-08-22 NOTE — Assessment & Plan Note (Addendum)
#  DEC 2020-Left breast cancer-2 separate primaries- A] T2N1-ER/PR positive HER-2 negative;  B] T1N1 ER/PR positive HER-2 negative. S/p Lumpectomy-RT. STABLE; BILATERAL Mammogram- SEP 2023.    # continue  LETROZOLE 1 pill a day. Tolerating well overall.  Continue the same.  # Chronic hypokalemia/diarrhea-potassium-3.7; on [place]  # Elevated LFTs-unclear etiology s/p extensive work-up/GI evaluation [2021] alkaline phosphatase-152-overall STABLE;  # BMD- T score= 0.9; continue Vit D [ca-constipation]; STABLE  # Weight loss: intentional; healthy life style; congratulated patient- STABLE.   # Fatigue/hypothyroidism-improved currently on Synthroid- followed by PCP-  STABLE.   # Insomnia/likely secondary to anxiety- on valium improved.  STABLE.   # DISPOSITION:  #follow up in 6  months-MD; labs- cbc/cmp; thyroid profile; prior- Dr.B.

## 2022-08-22 NOTE — Progress Notes (Signed)
[facility] NOTE  Patient Care Team: Tonia Ghent, MD as PCP - General (Family Medicine) Cammie Sickle, MD as Consulting Physician (Internal Medicine) Rolm Bookbinder, MD as Consulting Physician (General Surgery) Rico Junker, [name]  #  Oncology History Overview Note  # DEC 2020- LEFT BREAST-TWO PRIM ARIES:   #A]-IMC; ER/PR >90%; Her 2-IHC-2+; FISH-NEG-2 ; MRI- 3.7 CM; s/p s/p lumpectomy; s/p reexcision-final margins negative.  Stage Ib.    #B] Bx- IMC; G-1; ER/PR-pos; her2 NEG. s/p lumpectomy; margins negative [as per Dr.Wakefield; discussed at [facility] tumor conference]  # MARCH 31st 2021- START Taxotere [60 mg/m]; Cytoxan; growth factor x4 cycles; s/p radiation 04/29/2020  # MID-OCT 2021- Start anastrozole; MID MARCH 2022- stopped sec to intolerance; MID April 20222- START AROMASIN; STOPPED in OCT, 2022 [extreme fatigue heart fluttering]. FEB 22nd, 2023- Letrozole half a pill  #Extreme anxiety/chronic diarrhea/chronic severe hypokalemia/history of migraines/depression  Used estrogen and progesterone therapy: on premarin for post- menopausal x 2-3 years- discontinued in JAN 2021.   # SURVIVORSHIP:   DIAGNOSIS: Breast cancer  STAGE: IB & I     ;  GOALS: Cure     Carcinoma of upper-outer quadrant of left breast in female, estrogen receptor positive ([place])  08/14/2019 Initial Diagnosis   Carcinoma of upper-outer quadrant of left breast in female, estrogen receptor positive ([place])   09/10/2019 Genetic Testing   Negative genetic testing on the STAT and common hereditary cancer panel.  The Common Hereditary Gene Panel offered by [facility] includes sequencing and/or deletion duplication testing of the following 48 genes: APC, ATM, AXIN2, BARD1, BMPR1A, BRCA1, BRCA2, BRIP1, CDH1, CDK4, CDKN2A (p14ARF), CDKN2A (p16INK4a), CHEK2, CTNNA1, DICER1, EPCAM (Deletion/duplication  testing only), GREM1 (promoter region deletion/duplication testing only), KIT, MEN1, MLH1, MSH2, MSH3, MSH6, MUTYH, NBN, NF1, NHTL1, PALB2, PDGFRA, PMS2, POLD1, POLE, PTEN, RAD50, RAD51C, RAD51D, RNF43, SDHB, SDHC, SDHD, SMAD4, SMARCA4. STK11, TP53, TSC1, TSC2, and VHL.  The following genes were evaluated for sequence changes only: SDHA and HOXB13 c.251G>A variant only. The report date is September 10, 2019.   10/03/2019 Cancer Staging   Staging form: Breast, AJCC 8th Edition - Pathologic stage from 10/03/2019: Stage IB (pT2, pN1a, cM0, G2, ER+, PR+, HER2-) - Signed by Gardenia Phlegm, NP on 10/16/2019   10/09/2019 - 10/09/2019 Chemotherapy   The patient had dexamethasone (DECADRON) 4 MG tablet, 0 of 1 cycle, Start date: --, End date: -- DOXOrubicin (ADRIAMYCIN) chemo injection 98 mg, 60 mg/m2, Intravenous,  Once, 0 of 4 cycles PALONOSETRON HCL INJECTION 0.25 MG/5ML, 0.25 mg, Intravenous,  Once, 0 of 4 cycles pegfilgrastim-jmdb (FULPHILA) injection 6 mg, 6 mg, Subcutaneous,  Once, 0 of 4 cycles cyclophosphamide (CYTOXAN) 980 mg in sodium chloride 0.9 % 250 mL chemo infusion, 600 mg/m2, Intravenous,  Once, 0 of 4 cycles PACLitaxel (TAXOL) 132 mg in sodium chloride 0.9 % 250 mL chemo infusion (</= '80mg'$ /m2), 80 mg/m2, Intravenous,  Once, 0 of 12 cycles FOSAPREPITANT IV INFUSION 150 MG, 150 mg, Intravenous,  Once, 0 of 4 cycles  for chemotherapy treatment.    11/13/2019 - 01/16/2020 Chemotherapy   Patient is on Treatment Plan : BREAST TC q21d        HISTORY OF PRESENTING ILLNESS: Accompanied by her husband.  Ambulating independently.  Arcadia Gorgas Omary 62 y.o.  female with of breast cancer LEFT BREAST [T2N1-ER/PR positive HER-2 negative; T1N1-ER/PR positive HER-2 negative 2 separate primaries] s/p adjuvant chemotherapy; also s/p adjuvant  radiation-currently on letrozole is here for follow-up.  Patient denies any worsening joint pains or fatigue.  Denies any palpitations.  No nausea no  vomiting.  No headaches.  Chronic mild fatigue.  Review of Systems  Constitutional:  Positive for malaise/fatigue. Negative for chills, diaphoresis, fever and weight loss.  HENT:  Negative for nosebleeds and sore throat.   Eyes:  Negative for double vision.  Respiratory:  Negative for cough, hemoptysis, sputum production and wheezing.   Cardiovascular:  Negative for chest pain, palpitations, orthopnea and leg swelling.  Gastrointestinal:  Negative for abdominal pain, blood in stool, constipation, heartburn, melena, nausea and vomiting.  Genitourinary:  Negative for dysuria, frequency and urgency.  Musculoskeletal:  Negative for back pain and joint pain.  Neurological:  Negative for dizziness, tingling, focal weakness and weakness.  Endo/Heme/Allergies:  Does not bruise/bleed easily.  Psychiatric/Behavioral:  The patient is nervous/anxious and has insomnia.      MEDICAL HISTORY:  Past Medical History:  Diagnosis Date   Anxiety 10/13/96   Chest pain    atypical, hosp for that and depression 8/10-8/11/08   COPD (chronic obstructive pulmonary disease) ([place]) 09/15/05   by x-ray   Depression 10/13/96   hospital 8/10-8/11/08.  notable history: son with TBI after MVA   Family history of breast cancer    Family history of colon cancer    Family history of pancreatic cancer    Hyperlipidemia 09/15/98   Hypothyroidism 01/14/96   Migraine    39 yoa   NSVD (normal spontaneous vaginal delivery)    x 1   Personal history of chemotherapy    finished 01/2020   Personal history of radiation therapy    finished 04/2020   PONV (postoperative nausea and vomiting)    PTSD (post-traumatic stress disorder)    likely PTSD after son's MVA/TBI in 2007, s/p counseling    SURGICAL HISTORY: Past Surgical History:  Procedure Laterality Date   APPENDECTOMY     62 years of age   BREAST BIOPSY Left 08/06/2019   [place] bx/ ribbon clip/ invasive mammary carcinoma   BREAST BIOPSY Left 09/06/2019   [place] bx, heart  clip, invasive mammary carcinoma    BREAST BIOPSY Left 05/11/2021   u/s biopsy/ path pending   BREAST LUMPECTOMY Left 10/21/2019   Inv ductal Ca and DCIS    BREAST LUMPECTOMY WITH RADIOACTIVE SEED AND SENTINEL LYMPH NODE BIOPSY Left 10/01/2019   Procedure: LEFT BREAST LUMPECTOMY WITH BRACKETED RADIOACTIVE SEEDS AND LEFT AXILLARY SENTINEL LYMPH NODE BIOPSY;  Surgeon: Rolm Bookbinder, MD;  Location: [facility];  Service: General;  Laterality: Left;   PORTACATH PLACEMENT Right 10/28/2019   Procedure: INSERTION PORT-A-CATH WITH ULTRASOUND GUIDANCE;  Surgeon: Rolm Bookbinder, MD;  Location: [facility];  Service: General;  Laterality: Right;   RE-EXCISION OF BREAST LUMPECTOMY Left 10/28/2019   Procedure: RE-EXCISION OF LEFT BREAST LUMPECTOMY;  Surgeon: Rolm Bookbinder, MD;  Location: [facility];  Service: General;  Laterality: Left;    SOCIAL HISTORY: Social History   Socioeconomic History   Marital status: Married    Spouse name: Not on file   Number of children: Not on file   Years of education: Not on file   Highest education level: Not on file  Occupational History   Not on file  Tobacco Use   Smoking status: Former    Packs/day: 0.50    Years: 8.00    Total pack years: 4.00    Types: Cigarettes   Smokeless tobacco:  Never   Tobacco comments:    quit 04/15/2014  Vaping Use   Vaping Use: Every day  Substance and Sexual Activity   Alcohol use: No    Alcohol/week: 0.0 standard drinks of alcohol   Drug use: No   Sexual activity: Not on file  Other Topics Concern   Not on file  Social History Narrative   Remarried, lives with husband 1 son, he had TBI and short term memory loss after prolonged illness      #Patient lives in [place] with her husband; prior history of smoking/currently vapes.  No significant alcohol use.  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    Social Determinants of Health   Financial Resource Strain:  Not on file  Food Insecurity: Not on file  Transportation Needs: Not on file  Physical Activity: Not on file  Stress: Not on file  Social Connections: Not on file  Intimate Partner Violence: Not on file    FAMILY HISTORY: Family History  Problem Relation Age of Onset   Hypertension Mother    Colon cancer Father        d. 26   Thyroid disease Sister    Breast cancer Paternal Aunt    Breast cancer Paternal Grandmother    Colon cancer Paternal Grandmother    Breast cancer Cousin        paternal aunt's daughter   Colon cancer Paternal Uncle        3 pat uncles with colon cancer at unknown ages   Pancreatic cancer Maternal Uncle        d. 24   Breast cancer Paternal Aunt     ALLERGIES:  is allergic to sulfamethoxazole-trimethoprim and codeine.  MEDICATIONS:  Current Outpatient Medications  Medication Sig Dispense Refill   butalbital-acetaminophen-caffeine (FIORICET) 50-325-40 MG tablet TAKE 1 TABLET BY MOUTH EVERY 4 TO 6 HOURS AS NEEDED FOR HEADACHE 90 tablet 1   Calcium Carbonate-Vit D-Min (CALCIUM 1200 PO) Take 1 capsule by mouth daily.     diazepam (VALIUM) 10 MG tablet TAKE 1 TABLET BY MOUTH EVERY NIGHT AT BEDTIME AS NEEDED FOR ANXIETY. 30 tablet 2   FLUoxetine (PROZAC) 20 MG capsule TAKE 3 CAPSULES BY MOUTH DAILY 270 capsule 1   ibuprofen (ADVIL) 100 MG tablet Take 100 mg by mouth every 6 (six) hours as needed for fever.      levothyroxine (SYNTHROID) 88 MCG tablet TAKE 1 TABLET BY MOUTH DAILY EXCEPT FOR HALF TABLET ON SUNDAYS 90 tablet 0   loperamide (IMODIUM A-D) 2 MG tablet Take 4 mg by mouth 4 (four) times daily as needed for diarrhea or loose stools.      potassium chloride SA (KLOR-CON M) 20 MEQ tablet TAKE 1 TABLET BY MOUTH TWICE (2) DAILY 90 tablet 1   diphenoxylate-atropine (LOMOTIL) 2.5-0.025 MG tablet TAKE 1 TABLET BY MOUTH 4 TIMES A DAY AS NEEDED FOR DIARHEA OR LOOSE STOOLS. TAKE WITH IMMODIUM (Patient not taking: Reported on 12/01/2021) 30 tablet 0   letrozole  (FEMARA) 2.5 MG tablet Take 1 tablet (2.5 mg total) by mouth daily. 90 tablet 1   phenazopyridine (PYRIDIUM) 200 MG tablet Take 1 tablet (200 mg total) by mouth 3 (three) times daily as needed for pain. (Patient not taking: Reported on 08/22/2022) 10 tablet 0   No current facility-administered medications for this visit.      Marland Kitchen  PHYSICAL EXAMINATION: ECOG PERFORMANCE STATUS: 0 - Asymptomatic  Vitals:   08/22/22 1529 08/22/22 1533  BP:  95/67  Pulse:  Marland Kitchen)  102  Resp: 18   Temp: (!) 97.5 F (36.4 C)    Filed Weights   08/22/22 1529  Weight: 128 lb 6.4 oz (58.2 kg)    Physical Exam Constitutional:      Comments: Accompanied by her sister.  Ambulating independently.  HENT:     Head: Normocephalic and atraumatic.     Mouth/Throat:     Pharynx: No oropharyngeal exudate.  Eyes:     Pupils: Pupils are equal, round, and reactive to light.  Cardiovascular:     Rate and Rhythm: Normal rate and regular rhythm.  Pulmonary:     Effort: Pulmonary effort is normal. No respiratory distress.     Breath sounds: Normal breath sounds. No wheezing.  Abdominal:     General: Bowel sounds are normal. There is no distension.     Palpations: Abdomen is soft. There is no mass.     Tenderness: There is no abdominal tenderness. There is no guarding or rebound.  Musculoskeletal:        General: No tenderness. Normal range of motion.     Cervical back: Normal range of motion and neck supple.  Skin:    General: Skin is warm.     Comments: Mild left underarm swelling/chest wall swelling.  Neurological:     Mental Status: She is alert and oriented to person, place, and time.  Psychiatric:        Mood and Affect: Affect normal.     Comments: Anxious    LABORATORY DATA:  I have reviewed the data as listed Lab Results  Component Value Date   WBC 6.2 08/22/2022   HGB 13.3 08/22/2022   HCT 40.3 08/22/2022   MCV 95.3 08/22/2022   PLT 320 08/22/2022   Recent Labs    12/01/21 1356  03/02/22 1428 08/22/22 1454  NA 135 137 134*  K 3.3* 3.7 3.8  CL 103 106 104  CO2 23 21* 22  GLUCOSE 117* 105* 138*  BUN '10 12 12  '$ CREATININE 1.03* 1.10* 0.88  CALCIUM 9.1 9.2 8.9  GFRNONAA >60 57* >60  PROT 7.7 7.5 7.0  ALBUMIN 4.1 4.2 3.8  AST 26 25 58*  ALT 31 19 62*  ALKPHOS 191* 156* 183*  BILITOT 0.4 0.8 0.4    RADIOGRAPHIC STUDIES: I have personally reviewed the radiological images as listed and agreed with the findings in the report. No results found.  ASSESSMENT & PLAN:   Carcinoma of upper-outer quadrant of left breast in female, estrogen receptor positive ([place]) # DEC 2020-Left breast cancer-2 separate primaries- A] T2N1-ER/PR positive HER-2 negative;  B] T1N1 ER/PR positive HER-2 negative. S/p Lumpectomy-RT. STABLE; BILATERAL Mammogram- SEP 2023.    # continue  LETROZOLE 1 pill a day. Tolerating well overall.  Continue the same.  # Chronic hypokalemia/diarrhea-potassium-3.7; on [place]  # Elevated LFTs-unclear etiology s/p extensive work-up/GI evaluation [2021] alkaline phosphatase-152-overall STABLE;  # BMD- T score= 0.9; continue Vit D [ca-constipation]; STABLE  # Weight loss: intentional; healthy life style; congratulated patient- STABLE.   # Fatigue/hypothyroidism-improved currently on Synthroid- followed by PCP-  STABLE.   # Insomnia/likely secondary to anxiety- on valium improved.  STABLE.   # DISPOSITION:  #follow up in 6  months-MD; labs- cbc/cmp; thyroid profile; prior- Dr.B.  All questions were answered. The patient/family knows to call the clinic with any problems, questions or concerns.    Cammie Sickle, MD 08/29/2022 9:15 AM

## 2022-08-23 LAB — THYROID PANEL WITH TSH
Free Thyroxine Index: 1.9 (ref 1.2–4.9)
T3 Uptake Ratio: 26 % (ref 24–39)
T4, Total: 7.4 ug/dL (ref 4.5–12.0)
TSH: 1.31 u[IU]/mL (ref 0.450–4.500)

## 2022-08-29 ENCOUNTER — Other Ambulatory Visit: Payer: Self-pay | Admitting: Family Medicine

## 2022-08-29 DIAGNOSIS — E039 Hypothyroidism, unspecified: Secondary | ICD-10-CM

## 2022-08-30 NOTE — Telephone Encounter (Signed)
Patient called in to follow up on this refill request. She stated that she is out of her headache meds but have about 3 of the other left. Thank you!

## 2022-08-31 NOTE — Telephone Encounter (Signed)
Sent. Thanks.   

## 2022-08-31 NOTE — Telephone Encounter (Signed)
Refill request for [facility] 50-325-40  and  levothyroxine (SYNTHROID) 88 MCG tablet   LOV - 07/15/21 Next OV - 09/13/22 Last refill - 06/29/22 #90/1 fioricet and 05/25/22 #90/0 synthroid

## 2022-09-13 ENCOUNTER — Encounter: Payer: BC Managed Care – PPO | Admitting: Family Medicine

## 2022-09-22 ENCOUNTER — Encounter: Payer: BC Managed Care – PPO | Admitting: Family Medicine

## 2022-10-18 ENCOUNTER — Other Ambulatory Visit: Payer: Self-pay | Admitting: Family Medicine

## 2022-10-18 NOTE — Telephone Encounter (Signed)
Refill request for DIAZEPAM '10MG'$  TABLET   LOV - 07/15/21 Next OV - 11/10/22 Last refill - 06/22/22 #30/2

## 2022-10-19 NOTE — Telephone Encounter (Signed)
Rx has been called in  

## 2022-10-19 NOTE — Telephone Encounter (Signed)
Please call in.  Thanks.   

## 2022-10-31 ENCOUNTER — Other Ambulatory Visit: Payer: Self-pay | Admitting: Family Medicine

## 2022-10-31 NOTE — Telephone Encounter (Signed)
Refill request for BUTALBITAL/ACETAMINOPHEN/CAFFEINE  TABLET   LOV - 07/15/21 Next OV - 11/10/22 Last refill - 08/31/22 #90/1

## 2022-11-02 ENCOUNTER — Other Ambulatory Visit: Payer: Self-pay | Admitting: Family Medicine

## 2022-11-02 ENCOUNTER — Other Ambulatory Visit: Payer: Self-pay | Admitting: Internal Medicine

## 2022-11-02 NOTE — Telephone Encounter (Signed)
Refill request for FLUoxetine (PROZAC) 20 MG capsule   LOV - 07/15/21 Next OV - 11/10/22 Last refill - 07/20/22 #270/1

## 2022-11-02 NOTE — Telephone Encounter (Signed)
Sent. Thanks.   

## 2022-11-02 NOTE — Telephone Encounter (Signed)
Component Ref Range & Units 2 mo ago (08/22/22) 8 mo ago (03/02/22) 11 mo ago (12/01/21) 1 yr ago (10/04/21) 1 yr ago (06/11/21) 1 yr ago (03/12/21) 1 yr ago (01/18/21)  Potassium 3.5 - 5.1 mmol/L 3.8 3.7 3.3 Low  3.7 4.2 3.4 Low  3.7

## 2022-11-10 ENCOUNTER — Encounter: Payer: BC Managed Care – PPO | Admitting: Family Medicine

## 2022-11-24 ENCOUNTER — Ambulatory Visit (INDEPENDENT_AMBULATORY_CARE_PROVIDER_SITE_OTHER): Payer: BC Managed Care – PPO | Admitting: Family Medicine

## 2022-11-24 ENCOUNTER — Encounter: Payer: Self-pay | Admitting: Family Medicine

## 2022-11-24 VITALS — BP 110/80 | HR 76 | Temp 97.3°F | Ht 63.0 in | Wt 127.0 lb

## 2022-11-24 DIAGNOSIS — Z7189 Other specified counseling: Secondary | ICD-10-CM

## 2022-11-24 DIAGNOSIS — E039 Hypothyroidism, unspecified: Secondary | ICD-10-CM

## 2022-11-24 DIAGNOSIS — F411 Generalized anxiety disorder: Secondary | ICD-10-CM

## 2022-11-24 DIAGNOSIS — N950 Postmenopausal bleeding: Secondary | ICD-10-CM

## 2022-11-24 DIAGNOSIS — N939 Abnormal uterine and vaginal bleeding, unspecified: Secondary | ICD-10-CM

## 2022-11-24 DIAGNOSIS — Z0000 Encounter for general adult medical examination without abnormal findings: Secondary | ICD-10-CM | POA: Diagnosis not present

## 2022-11-24 NOTE — Patient Instructions (Signed)
Go to the lab on the way out.   If you have mychart we'll likely use that to update you.    Let me know if you don't get a call about the ultrasound.  Take care.  Glad to see you.

## 2022-11-24 NOTE — Progress Notes (Signed)
CPE- See plan.  Routine anticipatory guidance given to patient.  See health maintenance.  The possibility exists that previously documented standard health maintenance information may have been brought forward from a previous encounter into this note.  If needed, that same information has been updated to reflect the current situation based on today's encounter.    Tetanus 2017 Flu 2023 PNA and shingles not due.  D/w pt.   Covid prev done.   DXA not due Mammogram 2023 Pap 2024 I encouraged her to go through with colon cancer screening.  Discussed 2024.  Living will d/w pt.  Husband designated if patient were incapacitated.   Diet and exercise d/w pt. Encouraged both.    Hasn't had a period in years.  Then Monday with vaginal bleeding.  Not rectal bleeding.  Tried monistat w/o relief.  No white discharge.  Still on letrozole.    TSh wnl recently.  Complaint.    Anxiety.  Still on prozac and BZD at baseline.  No ADE on med.  Current rx helped.  Still with some insomnia at baseline.    PMH and SH reviewed  Meds, vitals, and allergies reviewed.   ROS: Per HPI.  Unless specifically indicated otherwise in HPI, the patient denies:  General: fever. Eyes: acute vision changes ENT: sore throat Cardiovascular: chest pain Respiratory: SOB GI: vomiting GU: dysuria Musculoskeletal: acute back pain Derm: acute rash Neuro: acute motor dysfunction Psych: worsening mood Endocrine: polydipsia Heme: bleeding Allergy: hayfever  GEN: nad, alert and oriented HEENT: ncat NECK: supple w/o LA CV: rrr. PULM: ctab, no inc wob ABD: soft, +bs EXT: no edema SKIN: no acute rash  Normal introitus for age, no external lesions, no vaginal discharge, mucosa pink and moist, no vaginal or cervical lesions, no vaginal atrophy, no friaility or hemorrhage, normal uterus size and position, no adnexal masses or tenderness.  Chaperoned exam. Scant blood per os after pap collected.

## 2022-11-25 LAB — CBC WITH DIFFERENTIAL/PLATELET
Basophils Absolute: 0.1 10*3/uL (ref 0.0–0.1)
Basophils Relative: 1.1 % (ref 0.0–3.0)
Eosinophils Absolute: 0.2 10*3/uL (ref 0.0–0.7)
Eosinophils Relative: 1.8 % (ref 0.0–5.0)
HCT: 40.5 % (ref 36.0–46.0)
Hemoglobin: 13.4 g/dL (ref 12.0–15.0)
Lymphocytes Relative: 31.3 % (ref 12.0–46.0)
Lymphs Abs: 2.8 10*3/uL (ref 0.7–4.0)
MCHC: 33.2 g/dL (ref 30.0–36.0)
MCV: 93.7 fl (ref 78.0–100.0)
Monocytes Absolute: 0.5 10*3/uL (ref 0.1–1.0)
Monocytes Relative: 5.9 % (ref 3.0–12.0)
Neutro Abs: 5.3 10*3/uL (ref 1.4–7.7)
Neutrophils Relative %: 59.9 % (ref 43.0–77.0)
Platelets: 346 10*3/uL (ref 150.0–400.0)
RBC: 4.32 Mil/uL (ref 3.87–5.11)
RDW: 13.2 % (ref 11.5–15.5)
WBC: 8.9 10*3/uL (ref 4.0–10.5)

## 2022-11-25 LAB — COMPREHENSIVE METABOLIC PANEL
ALT: 10 U/L (ref 0–35)
AST: 18 U/L (ref 0–37)
Albumin: 4.4 g/dL (ref 3.5–5.2)
Alkaline Phosphatase: 181 U/L — ABNORMAL HIGH (ref 39–117)
BUN: 9 mg/dL (ref 6–23)
CO2: 26 mEq/L (ref 19–32)
Calcium: 9.4 mg/dL (ref 8.4–10.5)
Chloride: 100 mEq/L (ref 96–112)
Creatinine, Ser: 1.01 mg/dL (ref 0.40–1.20)
GFR: 59.66 mL/min — ABNORMAL LOW (ref >60.00)
Glucose, Bld: 91 mg/dL (ref 70–99)
Potassium: 4.1 mEq/L (ref 3.5–5.1)
Sodium: 135 mEq/L (ref 135–145)
Total Bilirubin: 0.4 mg/dL (ref 0.2–1.2)
Total Protein: 7.7 g/dL (ref 6.0–8.3)

## 2022-11-27 DIAGNOSIS — N950 Postmenopausal bleeding: Secondary | ICD-10-CM | POA: Insufficient documentation

## 2022-11-27 NOTE — Assessment & Plan Note (Signed)
Pap collected today.  No obvious vaginal or cervical lesions noted.  Scant blood p.o. after Pap was collected.  Unremarkable pelvic exam.  See notes on labs.  Schedule pelvic ultrasound.  Rationale discussed with patient.  She agrees.

## 2022-11-27 NOTE — Assessment & Plan Note (Signed)
Living will d/w pt.  Husband designated if patient were incapacitated.  

## 2022-11-27 NOTE — Assessment & Plan Note (Signed)
Still on prozac and BZD at baseline.  No ADE on med.  Current rx helped.  Still with some insomnia at baseline.   Would continue both Prozac and benzodiazepine as is.  Not sedated.  Okay for outpatient follow-up.

## 2022-11-27 NOTE — Assessment & Plan Note (Signed)
Continue levothyroxine as is. 

## 2022-11-27 NOTE — Assessment & Plan Note (Signed)
Tetanus 2017 Flu 2023 PNA and shingles not due.  D/w pt.   Covid prev done.   DXA not due Mammogram 2023 Pap 2024 I encouraged her to go through with colon cancer screening.  Discussed 2024.  Living will d/w pt.  Husband designated if patient were incapacitated.   Diet and exercise d/w pt. Encouraged both.

## 2022-11-28 ENCOUNTER — Other Ambulatory Visit: Payer: Self-pay | Admitting: Family Medicine

## 2022-11-28 NOTE — Telephone Encounter (Signed)
Refill request for BUTALBITAL/ACETAMINOPHEN/CAFFEINE  TABLET   LOV - 11/24/22 Next OV - not scheduled Last refill - 11/01/22 #90/1

## 2022-11-30 ENCOUNTER — Ambulatory Visit
Admission: RE | Admit: 2022-11-30 | Discharge: 2022-11-30 | Disposition: A | Discharge status: Home / Self Care | Payer: BC Managed Care – PPO | Source: Ambulatory Visit | Attending: Family Medicine | Admitting: Family Medicine

## 2022-11-30 DIAGNOSIS — N939 Abnormal uterine and vaginal bleeding, unspecified: Secondary | ICD-10-CM

## 2022-11-30 DIAGNOSIS — N950 Postmenopausal bleeding: Secondary | ICD-10-CM | POA: Diagnosis not present

## 2022-12-01 LAB — CYTOLOGY - PAP
Adequacy: ABNORMAL
Comment: NEGATIVE
High risk HPV: NEGATIVE

## 2022-12-08 ENCOUNTER — Ambulatory Visit (INDEPENDENT_AMBULATORY_CARE_PROVIDER_SITE_OTHER): Payer: BC Managed Care – PPO | Admitting: Family Medicine

## 2022-12-08 ENCOUNTER — Other Ambulatory Visit (HOSPITAL_COMMUNITY)
Admission: RE | Admit: 2022-12-08 | Discharge: 2022-12-08 | Disposition: A | Discharge status: Home / Self Care | Payer: BC Managed Care – PPO | Source: Ambulatory Visit | Attending: Family Medicine | Admitting: Family Medicine

## 2022-12-08 ENCOUNTER — Encounter: Payer: Self-pay | Admitting: Family Medicine

## 2022-12-08 VITALS — BP 104/68 | HR 101 | Temp 97.2°F | Ht 63.0 in | Wt 125.0 lb

## 2022-12-08 DIAGNOSIS — N939 Abnormal uterine and vaginal bleeding, unspecified: Secondary | ICD-10-CM

## 2022-12-08 NOTE — Patient Instructions (Signed)
Take care.  Glad to see you. We'll update you with your pap results.

## 2022-12-08 NOTE — Progress Notes (Signed)
Discussed results from pap and u/s.  D/w pt about getting repeat pap today.  No bleeding in the meantime.  No discharge.  Prev HPV neg and ultrasound without any masses seen.  She did not have a thickened endometrial stripe.  Meds, vitals, and allergies reviewed.   ROS: Per HPI unless specifically indicated in ROS section   Nad Ncat Repeat Pap done. Normal introitus for age, no external lesions, no vaginal discharge, mucosa pink and moist, no vaginal or cervical lesions, no vaginal atrophy, no friaility or hemorrhage, normal uterus size and position, no adnexal masses or tenderness.  Chaperoned exam.  No blood per os.  Pap collected.

## 2022-12-11 DIAGNOSIS — N939 Abnormal uterine and vaginal bleeding, unspecified: Secondary | ICD-10-CM | POA: Insufficient documentation

## 2022-12-11 NOTE — Assessment & Plan Note (Signed)
Rationale for repeat Pap discussed with patient.  See notes on lab when resulted.  Ultrasound reassuring, HPV previously negative.  No bleeding in the meantime.

## 2022-12-16 LAB — CYTOLOGY - PAP
Comment: NEGATIVE
Diagnosis: NEGATIVE
High risk HPV: NEGATIVE

## 2022-12-17 ENCOUNTER — Other Ambulatory Visit: Payer: Self-pay | Admitting: Family Medicine

## 2022-12-19 NOTE — Telephone Encounter (Signed)
Refill request for DIAZEPAM 10MG  TABLET   LOV - 12/08/22 Next OV - not scheduled Last refill - 10/19/22 #30/2

## 2022-12-20 ENCOUNTER — Telehealth: Payer: Self-pay | Admitting: Family Medicine

## 2022-12-20 NOTE — Telephone Encounter (Signed)
Patient called in returning a call regarding her results. Thank you!

## 2022-12-21 NOTE — Telephone Encounter (Signed)
Patient aware of results.

## 2023-02-20 ENCOUNTER — Inpatient Hospital Stay: Payer: BC Managed Care – PPO | Admitting: Nurse Practitioner

## 2023-02-20 ENCOUNTER — Encounter: Payer: Self-pay | Admitting: Internal Medicine

## 2023-02-20 ENCOUNTER — Inpatient Hospital Stay: Payer: BC Managed Care – PPO | Attending: Nurse Practitioner

## 2023-02-27 ENCOUNTER — Other Ambulatory Visit: Payer: Self-pay | Admitting: Family Medicine

## 2023-02-28 NOTE — Telephone Encounter (Signed)
Refill request for BUTALBITAL/ACETAMINOPHEN/CAFFEINE  TABLET   LOV - 12/08/22 Next OV - not scheduled Last refill - 11/28/22 #90/1

## 2023-04-11 ENCOUNTER — Other Ambulatory Visit: Payer: Self-pay | Admitting: Family Medicine

## 2023-04-11 DIAGNOSIS — E039 Hypothyroidism, unspecified: Secondary | ICD-10-CM

## 2023-04-19 ENCOUNTER — Other Ambulatory Visit: Payer: Self-pay | Admitting: Family Medicine

## 2023-04-19 NOTE — Telephone Encounter (Signed)
Refill request for DIAZEPAM 10MG  TABLET   LOV - 12/08/22 Next OV - not scheduled Last refill - 12/20/22 #30/2

## 2023-04-19 NOTE — Telephone Encounter (Signed)
Sent. Thanks.   

## 2023-04-27 ENCOUNTER — Other Ambulatory Visit: Payer: Self-pay | Admitting: Family Medicine

## 2023-04-27 ENCOUNTER — Other Ambulatory Visit: Payer: Self-pay | Admitting: Internal Medicine

## 2023-04-27 DIAGNOSIS — Z1231 Encounter for screening mammogram for malignant neoplasm of breast: Secondary | ICD-10-CM

## 2023-04-28 NOTE — Telephone Encounter (Signed)
Refill request for BUTALBITAL/ACETAMINOPHEN/CAFFEINE  TABLET   LOV - 12/08/22 Next OV - not scheduled Last refill - 03/01/23 #90/1

## 2023-05-05 ENCOUNTER — Inpatient Hospital Stay: Payer: BC Managed Care – PPO

## 2023-05-05 ENCOUNTER — Inpatient Hospital Stay: Payer: BC Managed Care – PPO | Admitting: Nurse Practitioner

## 2023-05-26 ENCOUNTER — Encounter: Payer: Self-pay | Admitting: Nurse Practitioner

## 2023-05-26 ENCOUNTER — Inpatient Hospital Stay: Payer: BC Managed Care – PPO | Admitting: Nurse Practitioner

## 2023-05-26 ENCOUNTER — Inpatient Hospital Stay: Payer: BC Managed Care – PPO | Attending: Nurse Practitioner

## 2023-05-26 VITALS — BP 107/77 | HR 100 | Temp 97.6°F | Resp 16 | Wt 139.0 lb

## 2023-05-26 DIAGNOSIS — R197 Diarrhea, unspecified: Secondary | ICD-10-CM | POA: Diagnosis not present

## 2023-05-26 DIAGNOSIS — E039 Hypothyroidism, unspecified: Secondary | ICD-10-CM | POA: Insufficient documentation

## 2023-05-26 DIAGNOSIS — Z79811 Long term (current) use of aromatase inhibitors: Secondary | ICD-10-CM | POA: Diagnosis not present

## 2023-05-26 DIAGNOSIS — Z5181 Encounter for therapeutic drug level monitoring: Secondary | ICD-10-CM

## 2023-05-26 DIAGNOSIS — Z79899 Other long term (current) drug therapy: Secondary | ICD-10-CM | POA: Insufficient documentation

## 2023-05-26 DIAGNOSIS — Z170 Estrogen receptor positive status [ER+]: Secondary | ICD-10-CM

## 2023-05-26 DIAGNOSIS — C50412 Malignant neoplasm of upper-outer quadrant of left female breast: Secondary | ICD-10-CM

## 2023-05-26 DIAGNOSIS — Z08 Encounter for follow-up examination after completed treatment for malignant neoplasm: Secondary | ICD-10-CM

## 2023-05-26 DIAGNOSIS — G4700 Insomnia, unspecified: Secondary | ICD-10-CM | POA: Diagnosis not present

## 2023-05-26 DIAGNOSIS — E876 Hypokalemia: Secondary | ICD-10-CM | POA: Insufficient documentation

## 2023-05-26 DIAGNOSIS — Z853 Personal history of malignant neoplasm of breast: Secondary | ICD-10-CM

## 2023-05-26 DIAGNOSIS — R7989 Other specified abnormal findings of blood chemistry: Secondary | ICD-10-CM | POA: Insufficient documentation

## 2023-05-26 LAB — CBC WITH DIFFERENTIAL/PLATELET
Abs Immature Granulocytes: 0.03 10*3/uL (ref 0.00–0.07)
Basophils Absolute: 0.1 10*3/uL (ref 0.0–0.1)
Basophils Relative: 1 %
Eosinophils Absolute: 0.5 10*3/uL (ref 0.0–0.5)
Eosinophils Relative: 7 %
HCT: 42.9 % (ref 36.0–46.0)
Hemoglobin: 13.8 g/dL (ref 12.0–15.0)
Immature Granulocytes: 1 %
Lymphocytes Relative: 24 %
Lymphs Abs: 1.5 10*3/uL (ref 0.7–4.0)
MCH: 31 pg (ref 26.0–34.0)
MCHC: 32.2 g/dL (ref 30.0–36.0)
MCV: 96.4 fL (ref 80.0–100.0)
Monocytes Absolute: 0.4 10*3/uL (ref 0.1–1.0)
Monocytes Relative: 7 %
Neutro Abs: 3.9 10*3/uL (ref 1.7–7.7)
Neutrophils Relative %: 60 %
Platelets: 358 10*3/uL (ref 150–400)
RBC: 4.45 MIL/uL (ref 3.87–5.11)
RDW: 12.6 % (ref 11.5–15.5)
WBC: 6.4 10*3/uL (ref 4.0–10.5)
nRBC: 0 % (ref 0.0–0.2)

## 2023-05-26 LAB — COMPREHENSIVE METABOLIC PANEL
ALT: 16 U/L (ref 0–44)
AST: 20 U/L (ref 15–41)
Albumin: 4.2 g/dL (ref 3.5–5.0)
Alkaline Phosphatase: 159 U/L — ABNORMAL HIGH (ref 38–126)
Anion gap: 7 (ref 5–15)
BUN: 17 mg/dL (ref 8–23)
CO2: 23 mmol/L (ref 22–32)
Calcium: 9.2 mg/dL (ref 8.9–10.3)
Chloride: 104 mmol/L (ref 98–111)
Creatinine, Ser: 0.86 mg/dL (ref 0.44–1.00)
GFR, Estimated: >60 mL/min (ref >60)
Glucose, Bld: 105 mg/dL — ABNORMAL HIGH (ref 70–99)
Potassium: 3.7 mmol/L (ref 3.5–5.1)
Sodium: 134 mmol/L — ABNORMAL LOW (ref 135–145)
Total Bilirubin: 0.4 mg/dL (ref 0.3–1.2)
Total Protein: 8 g/dL (ref 6.5–8.1)

## 2023-05-26 NOTE — Progress Notes (Signed)
Pt reports she is only taking 1/2 tab daily of the letrozole because she didn't feel well on a whole tab and also makes hot flashes worse.

## 2023-05-26 NOTE — Progress Notes (Unsigned)
[facility] CONSULT NOTE  Patient Care Team: Jessica Nam, MD as PCP - General (Family Medicine) Jessica Coder, MD as Consulting Physician (Internal Medicine) Jessica Loron, MD as Consulting Physician (General Surgery) Jim Like, [name]  Virtual Visit Progress Note  I connected with Jessica Barnes on 05/26/23 at  2:00 PM EDT by video enabled telemedicine visit and verified that I am speaking with the correct person using two identifiers.   I discussed the limitations, risks, security and privacy concerns of performing an evaluation and management service by telemedicine and the availability of in-person appointments. I also discussed with the patient that there may be a patient responsible charge related to this service. The patient expressed understanding and agreed to proceed.   Other persons participating in the visit and their role in the encounter: none   Patient's location: clinic  Provider's location: home   CHIEF COMPLAINTS/PURPOSE OF CONSULTATION: Breast cancer  Oncology History Overview Note  # DEC 2020- LEFT BREAST-TWO PRIM ARIES:   #A]-IMC; ER/PR >90%; Her 2-IHC-2+; FISH-NEG-2 ; MRI- 3.7 CM; s/p s/p lumpectomy; s/p reexcision-final margins negative.  Stage Ib.    #B] Bx- IMC; G-1; ER/PR-pos; her2 NEG. s/p lumpectomy; margins negative [as per Dr.Wakefield; discussed at [facility] tumor conference]  # MARCH 31st 2021- START Taxotere [60 mg/m]; Cytoxan; growth factor x4 cycles; s/p radiation 04/29/2020  # MID-OCT 2021- Start anastrozole; MID MARCH 2022- stopped sec to intolerance; MID April 16109- START AROMASIN; STOPPED in OCT, 2022 [extreme fatigue heart fluttering]. FEB 22nd, 2023- Letrozole half a pill  #Extreme anxiety/chronic diarrhea/chronic severe hypokalemia/history of migraines/depression  Used estrogen and progesterone therapy: on premarin for post- menopausal x 2-3 years- discontinued in JAN  2021.   # SURVIVORSHIP:   DIAGNOSIS: Breast cancer  STAGE: IB & I     ;  GOALS: Cure     Carcinoma of upper-outer quadrant of left breast in female, estrogen receptor positive (HCC)  08/14/2019 Initial Diagnosis   Carcinoma of upper-outer quadrant of left breast in female, estrogen receptor positive (HCC)   09/10/2019 Genetic Testing   Negative genetic testing on the STAT and common hereditary cancer panel.  The Common Hereditary Gene Panel offered by [facility] includes sequencing and/or deletion duplication testing of the following 48 genes: APC, ATM, AXIN2, BARD1, BMPR1A, BRCA1, BRCA2, BRIP1, CDH1, CDK4, CDKN2A (p14ARF), CDKN2A (p16INK4a), CHEK2, CTNNA1, DICER1, EPCAM (Deletion/duplication testing only), GREM1 (promoter region deletion/duplication testing only), KIT, MEN1, MLH1, MSH2, MSH3, MSH6, MUTYH, NBN, NF1, NHTL1, PALB2, PDGFRA, PMS2, POLD1, POLE, PTEN, RAD50, RAD51C, RAD51D, RNF43, SDHB, SDHC, SDHD, SMAD4, SMARCA4. STK11, TP53, TSC1, TSC2, and VHL.  The following genes were evaluated for sequence changes only: SDHA and HOXB13 c.251G>A variant only. The report date is September 10, 2019.   10/03/2019 Cancer Staging   Staging form: Breast, AJCC 8th Edition - Pathologic stage from 10/03/2019: Stage IB (pT2, pN1a, cM0, G2, ER+, PR+, HER2-) - Signed by Loa Socks, NP on 10/16/2019   10/09/2019 - 10/09/2019 Chemotherapy   The patient had dexamethasone (DECADRON) 4 MG tablet, 0 of 1 cycle, Start date: --, End date: -- DOXOrubicin (ADRIAMYCIN) chemo injection 98 mg, 60 mg/m2, Intravenous,  Once, 0 of 4 cycles PALONOSETRON HCL INJECTION 0.25 MG/5ML, 0.25 mg, Intravenous,  Once, 0 of 4 cycles pegfilgrastim-jmdb (FULPHILA) injection 6 mg, 6 mg, Subcutaneous,  Once, 0 of 4 cycles cyclophosphamide (CYTOXAN) 980 mg in sodium chloride 0.9 % 250 mL chemo infusion, 600 mg/m2, Intravenous,  Once, 0  of 4 cycles PACLitaxel (TAXOL) 132 mg in sodium chloride 0.9 % 250 mL chemo infusion (</=  80mg /m2), 80 mg/m2, Intravenous,  Once, 0 of 12 cycles FOSAPREPITANT IV INFUSION 150 MG, 150 mg, Intravenous,  Once, 0 of 4 cycles  for chemotherapy treatment.    11/13/2019 - 01/16/2020 Chemotherapy   Patient is on Treatment Plan : BREAST TC q21d       HISTORY OF PRESENTING ILLNESS: Accompanied by her husband. Ambulating independently.  Jessica Barnes 62 y.o.  female with of breast cancer LEFT BREAST [T2N1-ER/PR positive HER-2 negative; T1N1-ER/PR positive HER-2 negative 2 separate primaries] s/p adjuvant chemotherapy and adjuvant radiation-currently on letrozole, 1/2 tablet since 2023, agrees to follow up via telemedicine. She complained of ongoing hot flashes and night sweats. Primarily occurring at night. Not long but bothersome. She doesn't like how she feels on letrozole and elected to reduce to half a tablet which she tolerates better. Denies skin changes, breast pain or masses. No unintentional weight loss or new bone pain. She had episode of vaginal bleeding earlier this year and underwent ultrasound. None since.   Review of Systems  Constitutional:  Positive for malaise/fatigue. Negative for chills, diaphoresis, fever and weight loss.  HENT:  Negative for nosebleeds and sore throat.   Eyes:  Negative for double vision.  Respiratory:  Negative for cough, hemoptysis, sputum production and wheezing.   Cardiovascular:  Negative for chest pain, palpitations, orthopnea and leg swelling.  Gastrointestinal:  Negative for abdominal pain, blood in stool, constipation, heartburn, melena, nausea and vomiting.  Genitourinary:  Negative for dysuria, frequency and urgency.  Musculoskeletal:  Negative for back pain, falls, joint pain and myalgias.  Neurological:  Negative for dizziness, tingling, focal weakness and weakness.  Endo/Heme/Allergies:  Does not bruise/bleed easily.  Psychiatric/Behavioral:  Negative for depression. The patient is nervous/anxious and has insomnia.      MEDICAL  HISTORY:  Past Medical History:  Diagnosis Date   Anxiety 10/13/96   Chest pain    atypical, hosp for that and depression 8/10-8/11/08   COPD (chronic obstructive pulmonary disease) (HCC) 09/15/05   by x-ray   Depression 10/13/96   hospital 8/10-8/11/08.  notable history: son with TBI after MVA   Family history of breast cancer    Family history of colon cancer    Family history of pancreatic cancer    Hyperlipidemia 09/15/98   Hypothyroidism 01/14/96   Migraine    27 yoa   NSVD (normal spontaneous vaginal delivery)    x 1   Personal history of chemotherapy    finished 01/2020   Personal history of radiation therapy    finished 04/2020   PONV (postoperative nausea and vomiting)    PTSD (post-traumatic stress disorder)    likely PTSD after son's MVA/TBI in 2007, s/p counseling    SURGICAL HISTORY: Past Surgical History:  Procedure Laterality Date   APPENDECTOMY     62 years of age   BREAST BIOPSY Left 08/06/2019   [place] bx/ ribbon clip/ invasive mammary carcinoma   BREAST BIOPSY Left 09/06/2019   [place] bx, heart clip, invasive mammary carcinoma    BREAST BIOPSY Left 05/11/2021   u/s biopsy/ path pending   BREAST LUMPECTOMY Left 10/21/2019   Inv ductal Ca and DCIS    BREAST LUMPECTOMY WITH RADIOACTIVE SEED AND SENTINEL LYMPH NODE BIOPSY Left 10/01/2019   Procedure: LEFT BREAST LUMPECTOMY WITH BRACKETED RADIOACTIVE SEEDS AND LEFT AXILLARY SENTINEL LYMPH NODE BIOPSY;  Surgeon: Jessica Loron, MD;  Location: [facility];  Service: General;  Laterality: Left;   PORTACATH PLACEMENT Right 10/28/2019   Procedure: INSERTION PORT-A-CATH WITH ULTRASOUND GUIDANCE;  Surgeon: Jessica Loron, MD;  Location: [facility];  Service: General;  Laterality: Right;   RE-EXCISION OF BREAST LUMPECTOMY Left 10/28/2019   Procedure: RE-EXCISION OF LEFT BREAST LUMPECTOMY;  Surgeon: Jessica Loron, MD;  Location: [facility];  Service: General;  Laterality: Left;     SOCIAL HISTORY: Social History   Socioeconomic History   Marital status: Married    Spouse name: Not on file   Number of children: Not on file   Years of education: Not on file   Highest education level: Not on file  Occupational History   Not on file  Tobacco Use   Smoking status: Former    Current packs/day: 0.50    Average packs/day: 0.5 packs/day for 8.0 years (4.0 ttl pk-yrs)    Types: Cigarettes   Smokeless tobacco: Never   Tobacco comments:    quit 04/15/2014  Vaping Use   Vaping status: Every Day  Substance and Sexual Activity   Alcohol use: No    Alcohol/week: 0.0 standard drinks of alcohol   Drug use: No   Sexual activity: Not on file  Other Topics Concern   Not on file  Social History Narrative   Remarried, lives with husband 1 son, he had TBI and short term memory loss after prolonged illness      #Patient lives in [place] with her husband; prior history of smoking/currently vapes.  No significant alcohol use.  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    Social Determinants of Health   Financial Resource Strain: Not on file  Food Insecurity: Not on file  Transportation Needs: Not on file  Physical Activity: Not on file  Stress: Not on file  Social Connections: Not on file  Intimate Partner Violence: Not on file    FAMILY HISTORY: Family History  Problem Relation Age of Onset   Hypertension Mother    Colon cancer Father        d. 38   Thyroid disease Sister    Breast cancer Paternal Aunt    Breast cancer Paternal Grandmother    Colon cancer Paternal Grandmother    Breast cancer Cousin        paternal aunt's daughter   Colon cancer Paternal Uncle        3 pat uncles with colon cancer at unknown ages   Pancreatic cancer Maternal Uncle        d. 47   Breast cancer Paternal Aunt     ALLERGIES:  is allergic to sulfamethoxazole-trimethoprim and codeine.  MEDICATIONS:  Current Outpatient Medications  Medication Sig Dispense Refill    butalbital-acetaminophen-caffeine (FIORICET) 50-325-40 MG tablet TAKE ONE TABLET BY MOUTH EVERY FOUR TO SIX HOURS AS NEEDED FOR HEADACHE 90 tablet 1   Calcium Carbonate-Vit D-Min (CALCIUM 1200 PO) Take 1 capsule by mouth daily.     diazepam (VALIUM) 10 MG tablet TAKE ONE TABLET BY MOUTH EVERY NIGHT AT BEDTIME AS NEEDED FOR ANXIETY. 30 tablet 2   diphenoxylate-atropine (LOMOTIL) 2.5-0.025 MG tablet TAKE 1 TABLET BY MOUTH 4 TIMES A DAY AS NEEDED FOR DIARHEA OR LOOSE STOOLS. TAKE WITH IMMODIUM 30 tablet 0   FLUoxetine (PROZAC) 20 MG capsule TAKE THREE CAPSULES BY MOUTH DAILY 270 capsule 1   ibuprofen (ADVIL) 100 MG tablet Take 100 mg by mouth every 6 (six) hours as needed for fever.  letrozole (FEMARA) 2.5 MG tablet Take 1 tablet (2.5 mg total) by mouth daily. 90 tablet 1   levothyroxine (SYNTHROID) 88 MCG tablet TAKE ONE TABLET BY MOUTH DAILY EXCEPT FOR HALF TABLET ON SUNDAYS 90 tablet 1   potassium chloride SA (KLOR-CON M) 20 MEQ tablet TAKE ONE TABLET BY MOUTH TWICE (2) DAILY (Patient taking differently: 20 mEq daily.) 90 tablet 1   loperamide (IMODIUM A-D) 2 MG tablet Take 4 mg by mouth 4 (four) times daily as needed for diarrhea or loose stools.  (Patient not taking: Reported on 05/26/2023)     No current facility-administered medications for this visit.    PHYSICAL EXAMINATION: ECOG PERFORMANCE STATUS: 0 - Asymptomatic  Vitals:   05/26/23 1403  BP: 107/77  Pulse: 100  Resp: 16  Temp: 97.6 F (36.4 C)   Filed Weights   05/26/23 1403  Weight: 139 lb (63 kg)   Physical Exam Constitutional:      Appearance: She is not ill-appearing.  Pulmonary:     Effort: No respiratory distress.  Neurological:     Mental Status: She is alert and oriented to person, place, and time.  Psychiatric:        Mood and Affect: Mood normal.        Behavior: Behavior normal.    LABORATORY DATA:  I have reviewed the data as listed Lab Results  Component Value Date   WBC 6.4 05/26/2023   HGB  13.8 05/26/2023   HCT 42.9 05/26/2023   MCV 96.4 05/26/2023   PLT 358 05/26/2023   Recent Labs    08/22/22 1454 11/24/22 1523 05/26/23 1343  NA 134* 135 134*  K 3.8 4.1 3.7  CL 104 100 104  CO2 22 26 23   GLUCOSE 138* 91 105*  BUN 12 9 17   CREATININE 0.88 1.01 0.86  CALCIUM 8.9 9.4 9.2  GFRNONAA >60  --  >60  PROT 7.0 7.7 8.0  ALBUMIN 3.8 4.4 4.2  AST 58* 18 20  ALT 62* 10 16  ALKPHOS 183* 181* 159*  BILITOT 0.4 0.4 0.4   Lab Results  Component Value Date   TSH 6.180 (H) 05/26/2023     RADIOGRAPHIC STUDIES: I have personally reviewed the radiological images as listed and agreed with the findings in the report. No results found.  ASSESSMENT & PLAN:   # Carcinoma of upper-outer quadrant of left breast in female, estrogen receptor positive - Stage Ib- DEC 2020-Left breast cancer-2 separate primaries- A- T2N1-ER/PR positive HER-2 negative and B- T1N1 ER/PR positive HER-2 negative. S/p Lumpectomy, reexcision with neg margins. 4 cycles of taxotere cytoxan chemotherapy and RT 04/29/20. Started anastrozole October 2021; d/c d/t intol March 2022. Mid April 2022 started aromasin. D/c d/t fatigue and palpitations. 10/06/21- started letrozole 1/2 pill d/t hot flashes, fatigue, myalgias. Sep 2023 mammogram bi-rads category 2: benign. Plan to repeat imaging annually; next Sep 2024. May consider extended therapy d/t 2 primaries and reduced dose if tolerating.   # BMD- 05/2020- T score= 0.9; continue Vit D [ca-constipation]. Reviewed that AI therapy may worsen bone density. Plan to add on bone density scan to date of her mammogram. Can consider checking her vitamin D level to ensure therapeutic.   # Chronic hypokalemia/diarrhea-potassium- 3.7; on Kdur. Stable.    # Elevated LFTs-unclear etiology s/p extensive work-up/GI evaluation [2021] alkaline phosphatase-159. Stable. Monitor.   # Weight loss: intentional; healthy life style. Weight up ~ 10 lbs since April. Encouraged continued efforts  and reviewed that management of BMI  helps minimize exogenous estrogen.     # Fatigue/hypothyroidism- TSH pending at time of visit. On synthroid. TSH 1.14 Sep 2022. Today, 6.18. Likely contributing to fatigue. Encouraged compliance with synthroid. Recommend follow up with Dr. Para March for interval lab checks. Refill sent at patient request.   # Insomnia/likely secondary to anxiety- on valium and fluoxetine per pcp. Stable.    # DISPOSITION:  Bone density- add to day that she is getting mammogram 6 mo- lab, Dr Donneta Romberg (patient request)- [place]    No problem-specific Assessment & Plan notes found for this encounter.  I discussed the assessment and treatment plan with the patient. The patient was provided an opportunity to ask questions and all were answered. The patient agreed with the plan and demonstrated an understanding of the instructions.   The patient was advised to call back or seek an in-person evaluation if the symptoms worsen or if the condition fails to improve as anticipated.   I spent 20 minutes face-to-face video visit time dedicated to the care of this patient on the date of this encounter to include pre-visit review of prior med-onc notes, face-to-face time with the patient, and post visit ordering of testing/documentation.   All questions were answered. The patient/family knows to call the clinic with any problems, questions or concerns.   Alinda Dooms, NP 05/26/2023

## 2023-05-27 LAB — THYROID PANEL WITH TSH
Free Thyroxine Index: 1.3 (ref 1.2–4.9)
T3 Uptake Ratio: 19 % — ABNORMAL LOW (ref 24–39)
T4, Total: 6.8 ug/dL (ref 4.5–12.0)
TSH: 6.18 u[IU]/mL — ABNORMAL HIGH (ref 0.450–4.500)

## 2023-06-22 ENCOUNTER — Other Ambulatory Visit: Payer: BC Managed Care – PPO

## 2023-06-29 ENCOUNTER — Other Ambulatory Visit: Payer: Self-pay | Admitting: Family Medicine

## 2023-06-29 NOTE — Telephone Encounter (Signed)
Last office visit:12/08/22 Next office visit: Nothing scheduled  Last refill: 04/30/23 butalbital-acetaminophen-caffeine (FIORICET) 50-325-40 MG tablet 90 tablets 1 refill

## 2023-07-05 ENCOUNTER — Ambulatory Visit
Admission: RE | Admit: 2023-07-05 | Discharge: 2023-07-05 | Disposition: A | Discharge status: Home / Self Care | Payer: BC Managed Care – PPO | Source: Ambulatory Visit | Attending: Internal Medicine | Admitting: Internal Medicine

## 2023-07-05 DIAGNOSIS — Z1231 Encounter for screening mammogram for malignant neoplasm of breast: Secondary | ICD-10-CM | POA: Insufficient documentation

## 2023-07-05 MED ORDER — POTASSIUM CHLORIDE CRYS ER 20 MEQ PO TBCR: 20.0000 meq | EXTENDED_RELEASE_TABLET | Freq: Every day | ORAL | 1 refills | Status: DC

## 2023-07-05 MED ORDER — LEVOTHYROXINE SODIUM 88 MCG PO TABS: 88.0000 ug | ORAL_TABLET | Freq: Every day | ORAL | 1 refills | Status: DC

## 2023-07-19 ENCOUNTER — Other Ambulatory Visit: Payer: Self-pay | Admitting: Family Medicine

## 2023-07-19 NOTE — Telephone Encounter (Signed)
Sent. Thanks.   

## 2023-07-19 NOTE — Telephone Encounter (Signed)
Last office visit: 12/08/22  Next office visit: nothing scheduled Last refill: 04/19/23 diazepam 30 tablets with 2 refills

## 2023-08-03 DIAGNOSIS — R300 Dysuria: Secondary | ICD-10-CM | POA: Diagnosis not present

## 2023-08-03 DIAGNOSIS — R350 Frequency of micturition: Secondary | ICD-10-CM | POA: Diagnosis not present

## 2023-08-28 ENCOUNTER — Other Ambulatory Visit: Payer: Self-pay | Admitting: Family Medicine

## 2023-08-29 NOTE — Telephone Encounter (Signed)
 Refill request for  butalbital-acetaminophen-caffeine (FIORICET) 50-325-40 MG tablet   LOV - 12/08/22 Next OV - not scheduled Last refill - 06/30/23 #90/1

## 2023-10-09 ENCOUNTER — Other Ambulatory Visit: Payer: Self-pay | Admitting: Internal Medicine

## 2023-10-16 ENCOUNTER — Encounter: Payer: Self-pay | Admitting: Nurse Practitioner

## 2023-10-16 ENCOUNTER — Inpatient Hospital Stay: Payer: BC Managed Care – PPO | Attending: Nurse Practitioner | Admitting: Nurse Practitioner

## 2023-10-16 VITALS — BP 127/47 | HR 90 | Temp 97.8°F | Resp 16 | Wt 144.0 lb

## 2023-10-16 DIAGNOSIS — J449 Chronic obstructive pulmonary disease, unspecified: Secondary | ICD-10-CM | POA: Insufficient documentation

## 2023-10-16 DIAGNOSIS — R234 Changes in skin texture: Secondary | ICD-10-CM | POA: Diagnosis not present

## 2023-10-16 DIAGNOSIS — Z79899 Other long term (current) drug therapy: Secondary | ICD-10-CM | POA: Insufficient documentation

## 2023-10-16 DIAGNOSIS — F4310 Post-traumatic stress disorder, unspecified: Secondary | ICD-10-CM | POA: Insufficient documentation

## 2023-10-16 DIAGNOSIS — Z923 Personal history of irradiation: Secondary | ICD-10-CM | POA: Insufficient documentation

## 2023-10-16 DIAGNOSIS — Z79811 Long term (current) use of aromatase inhibitors: Secondary | ICD-10-CM | POA: Diagnosis not present

## 2023-10-16 DIAGNOSIS — Z800 Family history of malignant neoplasm of digestive organs: Secondary | ICD-10-CM | POA: Diagnosis not present

## 2023-10-16 DIAGNOSIS — Z1501 Genetic susceptibility to malignant neoplasm of breast: Secondary | ICD-10-CM | POA: Diagnosis not present

## 2023-10-16 DIAGNOSIS — Z170 Estrogen receptor positive status [ER+]: Secondary | ICD-10-CM | POA: Diagnosis not present

## 2023-10-16 DIAGNOSIS — C50412 Malignant neoplasm of upper-outer quadrant of left female breast: Secondary | ICD-10-CM | POA: Insufficient documentation

## 2023-10-16 DIAGNOSIS — E785 Hyperlipidemia, unspecified: Secondary | ICD-10-CM | POA: Diagnosis not present

## 2023-10-16 DIAGNOSIS — Z5181 Encounter for therapeutic drug level monitoring: Secondary | ICD-10-CM

## 2023-10-16 DIAGNOSIS — Z9221 Personal history of antineoplastic chemotherapy: Secondary | ICD-10-CM | POA: Insufficient documentation

## 2023-10-16 DIAGNOSIS — Z803 Family history of malignant neoplasm of breast: Secondary | ICD-10-CM | POA: Diagnosis not present

## 2023-10-16 DIAGNOSIS — F17210 Nicotine dependence, cigarettes, uncomplicated: Secondary | ICD-10-CM | POA: Diagnosis not present

## 2023-10-16 DIAGNOSIS — E039 Hypothyroidism, unspecified: Secondary | ICD-10-CM | POA: Insufficient documentation

## 2023-10-16 NOTE — Progress Notes (Signed)
 Symptom Management Clinic  [facility] at [facility] A Department of the [place]. [facility] [address] [place], [place] [address] [phone] (phone) [phone] (fax)  Patient Care Team: Joaquim Nam, MD as PCP - General (Family Medicine) Earna Coder, MD as Consulting Physician (Internal Medicine) Emelia Loron, MD as Consulting Physician (General Surgery) Jim Like, [name]   Name of the patient: Jessica Barnes  [id number]  Jan 19, 1961   Date of visit: 10/16/23  Diagnosis- History of Breast Cancer  Chief complaint/ Reason for visit- Breast Mass  Heme/Onc history:  Oncology History Overview Note  # DEC 2020- LEFT BREAST-TWO PRIM ARIES:   #A]-IMC; ER/PR >90%; Her 2-IHC-2+; FISH-NEG-2 ; MRI- 3.7 CM; s/p s/p lumpectomy; s/p reexcision-final margins negative.  Stage Ib.    #B] Bx- IMC; G-1; ER/PR-pos; her2 NEG. s/p lumpectomy; margins negative [as per Dr.Wakefield; discussed at [facility] tumor conference]  # MARCH 31st 2021- START Taxotere [60 mg/m]; Cytoxan; growth factor x4 cycles; s/p radiation 04/29/2020  # MID-OCT 2021- Start anastrozole; MID MARCH 2022- stopped sec to intolerance; MID April 69629- START AROMASIN; STOPPED in OCT, 2022 [extreme fatigue heart fluttering]. FEB 22nd, 2023- Letrozole half a pill  #Extreme anxiety/chronic diarrhea/chronic severe hypokalemia/history of migraines/depression  Used estrogen and progesterone therapy: on premarin for post- menopausal x 2-3 years- discontinued in JAN 2021.   # SURVIVORSHIP:   DIAGNOSIS: Breast cancer  STAGE: IB & I     ;  GOALS: Cure     Carcinoma of upper-outer quadrant of left breast in female, estrogen receptor positive (HCC)  08/14/2019 Initial Diagnosis   Carcinoma of upper-outer quadrant of left breast in female, estrogen receptor positive (HCC)   09/10/2019 Genetic Testing   Negative genetic testing on  the STAT and common hereditary cancer panel.  The Common Hereditary Gene Panel offered by [facility] includes sequencing and/or deletion duplication testing of the following 48 genes: APC, ATM, AXIN2, BARD1, BMPR1A, BRCA1, BRCA2, BRIP1, CDH1, CDK4, CDKN2A (p14ARF), CDKN2A (p16INK4a), CHEK2, CTNNA1, DICER1, EPCAM (Deletion/duplication testing only), GREM1 (promoter region deletion/duplication testing only), KIT, MEN1, MLH1, MSH2, MSH3, MSH6, MUTYH, NBN, NF1, NHTL1, PALB2, PDGFRA, PMS2, POLD1, POLE, PTEN, RAD50, RAD51C, RAD51D, RNF43, SDHB, SDHC, SDHD, SMAD4, SMARCA4. STK11, TP53, TSC1, TSC2, and VHL.  The following genes were evaluated for sequence changes only: SDHA and HOXB13 c.251G>A variant only. The report date is September 10, 2019.   10/03/2019 Cancer Staging   Staging form: Breast, AJCC 8th Edition - Pathologic stage from 10/03/2019: Stage IB (pT2, pN1a, cM0, G2, ER+, PR+, HER2-) - Signed by Loa Socks, NP on 10/16/2019   10/09/2019 - 10/09/2019 Chemotherapy   The patient had dexamethasone (DECADRON) 4 MG tablet, 0 of 1 cycle, Start date: --, End date: -- DOXOrubicin (ADRIAMYCIN) chemo injection 98 mg, 60 mg/m2, Intravenous,  Once, 0 of 4 cycles PALONOSETRON HCL INJECTION 0.25 MG/5ML, 0.25 mg, Intravenous,  Once, 0 of 4 cycles pegfilgrastim-jmdb (FULPHILA) injection 6 mg, 6 mg, Subcutaneous,  Once, 0 of 4 cycles cyclophosphamide (CYTOXAN) 980 mg in sodium chloride 0.9 % 250 mL chemo infusion, 600 mg/m2, Intravenous,  Once, 0 of 4 cycles PACLitaxel (TAXOL) 132 mg in sodium chloride 0.9 % 250 mL chemo infusion (</= 80mg /m2), 80 mg/m2, Intravenous,  Once, 0 of 12 cycles FOSAPREPITANT IV INFUSION 150 MG, 150 mg, Intravenous,  Once, 0 of 4 cycles  for chemotherapy treatment.    11/13/2019 - 01/16/2020 Chemotherapy   Patient is on Treatment  Plan : BREAST TC q21d       Interval history- Patient is 63 year old female with history of breast cancer who presents to clinic for complaints of left  breast changes. She has chronic post surgical changes but reports that breast has chronically had some inversion post operatively but it appears to have intensified. She denies pain. No palpable masses. She's worried about recurrent cancer. She has gained weight, ~ 5 lbs in past 6 months/~ 10 lbs in past year.   Review of systems- Review of Systems  Constitutional:  Negative for chills, fever, malaise/fatigue and weight loss.  Respiratory:  Negative for cough.   Cardiovascular:  Negative for chest pain.  Skin:  Negative for itching and rash.  Psychiatric/Behavioral:  The patient is nervous/anxious.      Allergies  Allergen Reactions   Sulfamethoxazole-Trimethoprim     Lip and tongue edema   Codeine     GI upset    Past Medical History:  Diagnosis Date   Anxiety 10/13/96   Chest pain    atypical, hosp for that and depression 8/10-8/11/08   COPD (chronic obstructive pulmonary disease) (HCC) 09/15/05   by x-ray   Depression 10/13/96   hospital 8/10-8/11/08.  notable history: son with TBI after MVA   Family history of breast cancer    Family history of colon cancer    Family history of pancreatic cancer    Hyperlipidemia 09/15/98   Hypothyroidism 01/14/96   Migraine    27 yoa   NSVD (normal spontaneous vaginal delivery)    x 1   Personal history of chemotherapy    finished 01/2020   Personal history of radiation therapy    finished 04/2020   PONV (postoperative nausea and vomiting)    PTSD (post-traumatic stress disorder)    likely PTSD after son's MVA/TBI in 2007, s/p counseling    Past Surgical History:  Procedure Laterality Date   APPENDECTOMY     63 years of age   BREAST BIOPSY Left 08/06/2019   [place] bx/ ribbon clip/ invasive mammary carcinoma   BREAST BIOPSY Left 09/06/2019   [place] bx, heart clip, invasive mammary carcinoma    BREAST BIOPSY Left 05/11/2021   u/s biopsy/ path pending   BREAST LUMPECTOMY Left 10/21/2019   Inv ductal Ca and DCIS    BREAST LUMPECTOMY WITH  RADIOACTIVE SEED AND SENTINEL LYMPH NODE BIOPSY Left 10/01/2019   Procedure: LEFT BREAST LUMPECTOMY WITH BRACKETED RADIOACTIVE SEEDS AND LEFT AXILLARY SENTINEL LYMPH NODE BIOPSY;  Surgeon: Emelia Loron, MD;  Location: [facility];  Service: General;  Laterality: Left;   PORTACATH PLACEMENT Right 10/28/2019   Procedure: INSERTION PORT-A-CATH WITH ULTRASOUND GUIDANCE;  Surgeon: Emelia Loron, MD;  Location: [facility];  Service: General;  Laterality: Right;   RE-EXCISION OF BREAST LUMPECTOMY Left 10/28/2019   Procedure: RE-EXCISION OF LEFT BREAST LUMPECTOMY;  Surgeon: Emelia Loron, MD;  Location: [facility];  Service: General;  Laterality: Left;    Social History   Socioeconomic History   Marital status: Married    Spouse name: Not on file   Number of children: Not on file   Years of education: Not on file   Highest education level: Not on file  Occupational History   Not on file  Tobacco Use   Smoking status: Former    Current packs/day: 0.50    Average packs/day: 0.5 packs/day for 8.0 years (4.0 ttl pk-yrs)    Types: Cigarettes   Smokeless tobacco:  Never   Tobacco comments:    quit 04/15/2014  Vaping Use   Vaping status: Every Day  Substance and Sexual Activity   Alcohol use: No    Alcohol/week: 0.0 standard drinks of alcohol   Drug use: No   Sexual activity: Not on file  Other Topics Concern   Not on file  Social History Narrative   Remarried, lives with husband 1 son, he had TBI and short term memory loss after prolonged illness      #Patient lives in [place] with her husband; prior history of smoking/currently vapes.  No significant alcohol use.  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    Social Drivers of Corporate investment banker Strain: Not on file  Food Insecurity: Not on file  Transportation Needs: Not on file  Physical Activity: Not on file  Stress: Not on file  Social Connections: Not on file   Intimate Partner Violence: Not on file    Family History  Problem Relation Age of Onset   Hypertension Mother    Colon cancer Father        d. 65   Thyroid disease Sister    Breast cancer Paternal Aunt    Breast cancer Paternal Grandmother    Colon cancer Paternal Grandmother    Breast cancer Cousin        paternal aunt's daughter   Colon cancer Paternal Uncle        3 pat uncles with colon cancer at unknown ages   Pancreatic cancer Maternal Uncle        d. 61   Breast cancer Paternal Aunt      Current Outpatient Medications:    butalbital-acetaminophen-caffeine (FIORICET) 50-325-40 MG tablet, TAKE ONE TABLET BY MOUTH EVERY FOUR TO SIX HOURS AS NEEDED FOR HEADACHE, Disp: 90 tablet, Rfl: 1   Calcium Carbonate-Vit D-Min (CALCIUM 1200 PO), Take 1 capsule by mouth daily., Disp: , Rfl:    diazepam (VALIUM) 10 MG tablet, TAKE ONE TABLET BY MOUTH EVERY NIGHT AT BEDTIME AS NEEDED FOR ANXIETY., Disp: 30 tablet, Rfl: 2   diphenoxylate-atropine (LOMOTIL) 2.5-0.025 MG tablet, TAKE 1 TABLET BY MOUTH 4 TIMES A DAY AS NEEDED FOR DIARHEA OR LOOSE STOOLS. TAKE WITH IMMODIUM, Disp: 30 tablet, Rfl: 0   FLUoxetine (PROZAC) 20 MG capsule, TAKE THREE CAPSULES BY MOUTH DAILY, Disp: 270 capsule, Rfl: 1   ibuprofen (ADVIL) 100 MG tablet, Take 100 mg by mouth every 6 (six) hours as needed for fever. , Disp: , Rfl:    letrozole (FEMARA) 2.5 MG tablet, Take 1 tablet (2.5 mg total) by mouth daily., Disp: 90 tablet, Rfl: 1   levothyroxine (SYNTHROID) 88 MCG tablet, Take 1 tablet (88 mcg total) by mouth daily before breakfast., Disp: 90 tablet, Rfl: 1   potassium chloride SA (KLOR-CON M) 20 MEQ tablet, TAKE ONE TABLET BY MOUTH TWICE (2) DAILY, Disp: 90 tablet, Rfl: 1  Physical exam:  Vitals:   10/16/23 1112  BP: (!) 127/47  Pulse: 90  Resp: 16  Temp: 97.8 F (36.6 C)  TempSrc: Tympanic  SpO2: 100%  Weight: 144 lb (65.3 kg)   Physical Exam Chest:  Breasts:    Left: No swelling or mass.      Comments: Per patient, nipple area appears more inverted than previous and top of breast appears fuller. No apparent skin changes, nodules, or lymphadenopathy.  Lymphadenopathy:     Upper Body:     Left upper body: No supraclavicular, axillary or pectoral adenopathy.  No results found.  Assessment and plan- Patient is a 63 y.o. female   Left breast changes- reportedly increased inversion and fullness. No appreciable mass. Sep 2024 mammogram was negative. High risk due to history of 2 previous primaries and reduced dose AI. Plan for MRI breast to evaluate. Patient in agreement.  Carcinoma of upper-outer quadrant of left breast in female, estrogen receptor positive - Stage Ib- DEC 2020-Left breast cancer-2 separate primaries- A- T2N1-ER/PR positive HER-2 negative and B- T1N1 ER/PR positive HER-2 negative. S/p Lumpectomy, reexcision with neg margins. 4 cycles of taxotere cytoxan chemotherapy and RT 04/29/20. Started anastrozole October 2021; d/c d/t intol March 2022. Mid April 2022 started aromasin. D/c d/t fatigue and palpitations. 10/06/21- started letrozole 1/2 pill d/t hot flashes, fatigue, myalgias. Sep 2023 mammogram bi-rads category 2: benign. Plan to repeat imaging annually. Last 05/05/23 bi-rads 1: negative. May consider extended therapy d/t 2 primaries and reduced dose if tolerating.   Disposition:  MRI Breast Follow up as planned or rtc sooner if symptoms worsen in interim- la   Visit Diagnosis 1. Carcinoma of upper-outer quadrant of left breast in female, estrogen receptor positive (HCC)   2. Encounter for monitoring aromatase inhibitor therapy     Patient expressed understanding and was in agreement with this plan. She also understands that She can call clinic at any time with any questions, concerns, or complaints.   Thank you for allowing me to participate in the care of this very pleasant patient.   Consuello Masse, DNP, AGNP-C, AOCNP [facility] [phone]  CC:

## 2023-10-19 ENCOUNTER — Other Ambulatory Visit: Payer: Self-pay | Admitting: *Deleted

## 2023-10-19 ENCOUNTER — Other Ambulatory Visit: Payer: Self-pay | Admitting: Family Medicine

## 2023-10-19 DIAGNOSIS — Z170 Estrogen receptor positive status [ER+]: Secondary | ICD-10-CM

## 2023-10-20 ENCOUNTER — Other Ambulatory Visit: Payer: Self-pay | Admitting: Nurse Practitioner

## 2023-10-20 DIAGNOSIS — Z170 Estrogen receptor positive status [ER+]: Secondary | ICD-10-CM

## 2023-10-20 NOTE — Progress Notes (Signed)
 Order changed per request of imaging center

## 2023-10-20 NOTE — Telephone Encounter (Signed)
 Last office visit: 12/08/22 Next office visit: nothing schedules Last refill: diazepam (VALIUM) 10 MG tablet 30 tablets 2 refills

## 2023-10-23 ENCOUNTER — Ambulatory Visit
Admission: RE | Admit: 2023-10-23 | Discharge: 2023-10-23 | Disposition: A | Discharge status: Home / Self Care | Source: Ambulatory Visit | Attending: Nurse Practitioner | Admitting: Nurse Practitioner

## 2023-10-30 ENCOUNTER — Other Ambulatory Visit: Payer: Self-pay | Admitting: Family Medicine

## 2023-11-02 ENCOUNTER — Other Ambulatory Visit: Payer: Self-pay | Admitting: Family Medicine

## 2023-11-02 NOTE — Telephone Encounter (Signed)
 LOV:12/08/22 NOV: NOTHING SCHEDULED LAST REFILL: butalbital-acetaminophen-caffeine (FIORICET) 50-325-40 MG tablet 01/15/202590 TABLETS 1 REFILL

## 2023-11-02 NOTE — Telephone Encounter (Signed)
 Copied from CRM [id number]. Topic: Clinical - Medication Refill >> Nov 02, 2023  4:07 PM Isabell A wrote: Most Recent Primary Care Visit:  Provider: Joaquim Nam  Department: [facility]  Visit Type: OFFICE VISIT  Date: 12/08/2022  Medication: butalbital-acetaminophen-caffeine (FIORICET) 50-325-40 MG tablet  Has the patient contacted their pharmacy? Yes (Agent: If no, request that the patient contact the pharmacy for the refill. If patient does not wish to contact the pharmacy document the reason why and proceed with request.) (Agent: If yes, when and what did the pharmacy advise?)  Is this the correct pharmacy for this prescription? Yes If no, delete pharmacy and type the correct one.  This is the patient's preferred pharmacy:  [facility] - [place], [place] - [address] [place] [address] Phone: [phone] Fax: [phone]   Has the prescription been filled recently? No  Is the patient out of the medication? Yes  Has the patient been seen for an appointment in the last year OR does the patient have an upcoming appointment? Yes  Can we respond through MyChart? No  Agent: Please be advised that Rx refills may take up to 3 business days. We ask that you follow-up with your pharmacy.

## 2023-11-03 NOTE — Telephone Encounter (Signed)
 Duplicate request.  Filled another encounter.

## 2023-11-03 NOTE — Telephone Encounter (Signed)
 Copied from CRM [id number]. Topic: Clinical - Medication Refill >> Nov 02, 2023  4:07 PM Isabell A wrote: Most Recent Primary Care Visit:  Provider: Joaquim Nam  Department: [facility]  Visit Type: OFFICE VISIT  Date: 12/08/2022  Medication: butalbital-acetaminophen-caffeine (FIORICET) 50-325-40 MG tablet  Has the patient contacted their pharmacy? Yes (Agent: If no, request that the patient contact the pharmacy for the refill. If patient does not wish to contact the pharmacy document the reason why and proceed with request.) (Agent: If yes, when and what did the pharmacy advise?)  Is this the correct pharmacy for this prescription? Yes If no, delete pharmacy and type the correct one.  This is the patient's preferred pharmacy:  [facility] - [place], [place] - [address] [place] [address] Phone: [phone] Fax: [phone]   Has the prescription been filled recently? No  Is the patient out of the medication? Yes  Has the patient been seen for an appointment in the last year OR does the patient have an upcoming appointment? Yes  Can we respond through MyChart? No  Agent: Please be advised that Rx refills may take up to 3 business days. We ask that you follow-up with your pharmacy. >> Nov 03, 2023  8:58 AM Nurse Morrie Sheldon T wrote: Prescription sent by Dr. Para March 11/03/23 >> Nov 02, 2023  4:18 PM Almira Coaster wrote: Pharmacy is calling to follow up on this request, informed them of the turnaround time for medication refill.

## 2023-11-06 ENCOUNTER — Other Ambulatory Visit

## 2023-11-12 ENCOUNTER — Other Ambulatory Visit

## 2023-11-24 ENCOUNTER — Inpatient Hospital Stay: Payer: BC Managed Care – PPO | Admitting: Internal Medicine

## 2023-11-24 ENCOUNTER — Inpatient Hospital Stay: Payer: BC Managed Care – PPO

## 2023-11-27 ENCOUNTER — Ambulatory Visit
Admission: RE | Admit: 2023-11-27 | Discharge: 2023-11-27 | Disposition: A | Discharge status: Home / Self Care | Source: Ambulatory Visit | Attending: Nurse Practitioner

## 2023-11-27 DIAGNOSIS — Z170 Estrogen receptor positive status [ER+]: Secondary | ICD-10-CM

## 2023-11-27 DIAGNOSIS — Z853 Personal history of malignant neoplasm of breast: Secondary | ICD-10-CM | POA: Diagnosis not present

## 2023-11-27 DIAGNOSIS — Z08 Encounter for follow-up examination after completed treatment for malignant neoplasm: Secondary | ICD-10-CM | POA: Diagnosis not present

## 2023-11-27 MED ORDER — GADOPICLENOL 0.5 MMOL/ML IV SOLN
7.0000 mL | Freq: Once | INTRAVENOUS | Status: AC | PRN
  Administered 2023-11-27: 7 mL via INTRAVENOUS

## 2023-12-11 ENCOUNTER — Inpatient Hospital Stay

## 2023-12-11 ENCOUNTER — Inpatient Hospital Stay: Admitting: Internal Medicine

## 2023-12-12 ENCOUNTER — Other Ambulatory Visit

## 2024-01-01 ENCOUNTER — Other Ambulatory Visit: Payer: Self-pay | Admitting: Family Medicine

## 2024-01-10 ENCOUNTER — Inpatient Hospital Stay

## 2024-01-10 ENCOUNTER — Inpatient Hospital Stay: Admitting: Internal Medicine

## 2024-01-19 ENCOUNTER — Other Ambulatory Visit: Payer: Self-pay | Admitting: Family Medicine

## 2024-02-07 ENCOUNTER — Inpatient Hospital Stay: Admitting: Internal Medicine

## 2024-02-07 ENCOUNTER — Inpatient Hospital Stay

## 2024-02-14 ENCOUNTER — Inpatient Hospital Stay

## 2024-02-14 ENCOUNTER — Inpatient Hospital Stay: Admitting: Internal Medicine

## 2024-02-16 DIAGNOSIS — R109 Unspecified abdominal pain: Secondary | ICD-10-CM | POA: Diagnosis not present

## 2024-02-16 DIAGNOSIS — F32A Depression, unspecified: Secondary | ICD-10-CM | POA: Diagnosis not present

## 2024-02-16 DIAGNOSIS — E785 Hyperlipidemia, unspecified: Secondary | ICD-10-CM | POA: Diagnosis not present

## 2024-02-16 DIAGNOSIS — B029 Zoster without complications: Secondary | ICD-10-CM | POA: Diagnosis not present

## 2024-02-16 DIAGNOSIS — Z853 Personal history of malignant neoplasm of breast: Secondary | ICD-10-CM | POA: Diagnosis not present

## 2024-02-16 DIAGNOSIS — Z79890 Hormone replacement therapy (postmenopausal): Secondary | ICD-10-CM | POA: Diagnosis not present

## 2024-02-16 DIAGNOSIS — Z87891 Personal history of nicotine dependence: Secondary | ICD-10-CM | POA: Diagnosis not present

## 2024-02-16 DIAGNOSIS — R21 Rash and other nonspecific skin eruption: Secondary | ICD-10-CM | POA: Diagnosis not present

## 2024-02-16 DIAGNOSIS — Z79899 Other long term (current) drug therapy: Secondary | ICD-10-CM | POA: Diagnosis not present

## 2024-02-16 DIAGNOSIS — M545 Low back pain, unspecified: Secondary | ICD-10-CM | POA: Diagnosis not present

## 2024-02-16 DIAGNOSIS — E039 Hypothyroidism, unspecified: Secondary | ICD-10-CM | POA: Diagnosis not present

## 2024-02-19 ENCOUNTER — Other Ambulatory Visit: Payer: Self-pay | Admitting: Family Medicine

## 2024-02-19 ENCOUNTER — Other Ambulatory Visit: Payer: Self-pay | Admitting: Nurse Practitioner

## 2024-02-19 DIAGNOSIS — E039 Hypothyroidism, unspecified: Secondary | ICD-10-CM

## 2024-02-20 NOTE — Telephone Encounter (Signed)
 Last filled on 08/29/23 #270 caps/ 1 refill  Last OV was a gyn exam on 12/08/22 (over a year)  no future appts., please advise

## 2024-02-21 NOTE — Telephone Encounter (Signed)
 Sent with note to schedule OV.  Thanks.

## 2024-02-27 ENCOUNTER — Other Ambulatory Visit: Payer: Self-pay | Admitting: Family Medicine

## 2024-02-28 ENCOUNTER — Telehealth: Payer: Self-pay

## 2024-02-28 NOTE — Telephone Encounter (Signed)
 LOV: 12/08/22 NOV: NOTHING SCHEDULED Last Refill:BUTALBITAL /ACETAMINOPHEN /CAFFEINE   TABLET 01/01/24 90 TABLETS 1 REFILL

## 2024-02-28 NOTE — Telephone Encounter (Signed)
 FYI Only or Action Required?: Action required by provider: medication refill request.  Patient was last seen in primary care on 12/08/2022 by Cleatus Arlyss RAMAN, MD.  Called Nurse Triage reporting Medication Refill.  Symptoms began several days ago.  Interventions attempted: OTC medications: Tylenol .  Symptoms are: unchanged.  Triage Disposition: No disposition on file.  Patient/caregiver understands and will follow disposition?:    Pt is out of Fioricet and asking for refill. States she called in refill yesterday but hadn't heard back today. She did check with pharmacy for refills on file but was advised she doesn't have any and was advised to call PCP. Pt is asking if medication can be sent in today. Advised pt that I would call [facility] and see if anything can be done today since sending message back will not get response today. Called [facility] spoke with Renee. She states that no clinical person was able to take call for refill and advise pt 24 hour turn around time and calling in refill 1 week in advance. Attempted to call pt back, had to LVM.   Copied from CRM [id number]. Topic: Clinical - Red Word Triage >> Feb 28, 2024  4:32 PM Tiffany S wrote: Red Word that prompted transfer to Nurse Triage: headache migraine patient stated she is in pain

## 2024-02-29 NOTE — Telephone Encounter (Signed)
 Closing encounter. Medication has been sent

## 2024-04-09 ENCOUNTER — Encounter: Admitting: Family Medicine

## 2024-04-18 ENCOUNTER — Other Ambulatory Visit: Payer: Self-pay | Admitting: Family Medicine

## 2024-04-18 NOTE — Telephone Encounter (Signed)
 LOV: 12/08/22 NOV: 05/09/24 Last Refill:  diazepam  (VALIUM ) 10 MG tablet 01/21/24 30 tablets 2 refills

## 2024-04-29 ENCOUNTER — Other Ambulatory Visit: Payer: Self-pay | Admitting: Family Medicine

## 2024-05-09 ENCOUNTER — Encounter: Admitting: Family Medicine

## 2024-05-21 ENCOUNTER — Other Ambulatory Visit: Payer: Self-pay | Admitting: Internal Medicine

## 2024-06-07 ENCOUNTER — Encounter: Payer: Self-pay | Admitting: Family Medicine

## 2024-06-07 ENCOUNTER — Ambulatory Visit: Admitting: Family Medicine

## 2024-06-07 VITALS — BP 120/78 | HR 93 | Temp 98.4°F | Ht 63.0 in | Wt 140.5 lb

## 2024-06-07 DIAGNOSIS — Z23 Encounter for immunization: Secondary | ICD-10-CM

## 2024-06-07 DIAGNOSIS — Z0000 Encounter for general adult medical examination without abnormal findings: Secondary | ICD-10-CM | POA: Diagnosis not present

## 2024-06-07 DIAGNOSIS — E039 Hypothyroidism, unspecified: Secondary | ICD-10-CM

## 2024-06-07 DIAGNOSIS — Z170 Estrogen receptor positive status [ER+]: Secondary | ICD-10-CM

## 2024-06-07 DIAGNOSIS — E785 Hyperlipidemia, unspecified: Secondary | ICD-10-CM

## 2024-06-07 DIAGNOSIS — Z7189 Other specified counseling: Secondary | ICD-10-CM

## 2024-06-07 DIAGNOSIS — G43909 Migraine, unspecified, not intractable, without status migrainosus: Secondary | ICD-10-CM

## 2024-06-07 DIAGNOSIS — F411 Generalized anxiety disorder: Secondary | ICD-10-CM

## 2024-06-07 NOTE — Patient Instructions (Addendum)
 I'll update Dr. Rennie.  Please call about follow up.  Take care.  Glad to see you. Fasting lab visit when possible.

## 2024-06-07 NOTE — Progress Notes (Signed)
 CPE- See plan.  Routine anticipatory guidance given to patient.  See health maintenance.  The possibility exists that previously documented standard health maintenance information may have been brought forward from a previous encounter into this note.  If needed, that same information has been updated to reflect the current situation based on today's encounter.    Tetanus 2017 Flu 2025 PNA and shingles d/w pt.  Covid prev done.   DXA not due Breast MRI 2025.   Pap 2024 I encouraged her to go through with colon cancer screening.  Discussed 2025 Living will d/w pt.  Husband designated if patient were incapacitated.   Diet and exercise d/w pt. Encouraged both.    Anxiety. Still on prozac  and BZD at baseline. No ADE on med. Current SSRI rx helped. Still with some insomnia at baseline.  Insomnia worse since menopause.  Still with some occ hot flashes but better off anastrazole.   Still dealing with headaches at baseline. Fioricet helped with HA.  No ADE on med.   Prev shingles d/w pt.  Still with dermatomal paresthesia on R lower trunk.    Hypothyroidism.  Complaint.  D/w pt about replacement.  Labs pending.   She couldn't tolerate letrozole  and had to stop med a few months ago.  She felt generally unwell/fatigued on med.  Discussed.  I asked her to follow-up with oncology.  PMH and SH reviewed  Meds, vitals, and allergies reviewed.   ROS: Per HPI.  Unless specifically indicated otherwise in HPI, the patient denies:  General: fever. Eyes: acute vision changes ENT: sore throat Cardiovascular: chest pain Respiratory: SOB GI: vomiting GU: dysuria Musculoskeletal: acute back pain Derm: acute rash Neuro: acute motor dysfunction Psych: worsening mood Endocrine: polydipsia Heme: bleeding Allergy: hayfever  GEN: nad, alert and oriented HEENT: mucous membranes moist NECK: supple w/o LA CV: rrr. PULM: ctab, no inc wob ABD: soft, +bs EXT: no edema SKIN: well perfused.

## 2024-06-09 ENCOUNTER — Telehealth: Payer: Self-pay | Admitting: Family Medicine

## 2024-06-09 MED ORDER — GABAPENTIN 100 MG PO CAPS: 100.0000 mg | ORAL_CAPSULE | Freq: Every day | ORAL | 1 refills | Status: AC

## 2024-06-09 NOTE — Assessment & Plan Note (Signed)
 History of.  She could not tolerate letrozole  and had to stop that a few months ago.  I am going to route this note to Dr. Rennie as FYI and for input.  I asked her to follow-up with oncology.

## 2024-06-09 NOTE — Assessment & Plan Note (Signed)
 Tetanus 2017 Flu 2025 PNA and shingles d/w pt.  Covid prev done.   DXA not due Breast MRI 2025.   Pap 2024 I encouraged her to go through with colon cancer screening.  Discussed 2025 Living will d/w pt.  Husband designated if patient were incapacitated.   Diet and exercise d/w pt. Encouraged both.

## 2024-06-09 NOTE — Assessment & Plan Note (Signed)
 She historically used Fioricet.  No adverse effect of medication.  She still having headaches at baseline.  I will consider options.  See following phone note.

## 2024-06-09 NOTE — Assessment & Plan Note (Signed)
Living will d/w pt.  Husband designated if patient were incapacitated.  

## 2024-06-09 NOTE — Telephone Encounter (Signed)
 Please check with patient.  She could try taking gabapentin  at night and see if that helped with headache prevention and also with sleep.  I would start with taking 100 mg at night and gradually increase up to 300 mg at night if she can tolerate that and if she does not have relief with the initial dose.  Please let me know how that goes.  I sent the prescription.  Thanks.

## 2024-06-09 NOTE — Assessment & Plan Note (Signed)
 Continue with Prozac  and benzodiazepine at baseline.  Okay for outpatient follow-up.  She still dealing with insomnia and I want to consider options.  See following phone note.

## 2024-06-09 NOTE — Assessment & Plan Note (Signed)
 Complaint.  D/w pt about replacement.  Labs pending.  See notes on labs.  Continue levothyroxine  as is.

## 2024-06-10 NOTE — Telephone Encounter (Signed)
 Spoke with pt relaying Dr Elfredia message and notifying her rx was sent to [facility]. Pt verbalizes understanding.

## 2024-06-26 ENCOUNTER — Other Ambulatory Visit: Payer: Self-pay | Admitting: Family Medicine

## 2024-06-26 NOTE — Telephone Encounter (Signed)
 Medication: Fioricet Directions: AKE ONE TABLET BY MOUTH EVERY FOUR TO SIX HOURS AS NEEDED FOR HEADACHE (PLEASE CONTACT OFFICE FOR APPT CALL [phone] OR USE MYCHART.), Normal  Last given: 04/30/2024 Number refills: 1 Last o/v: 08/07/2024 Follow up:  Labs:

## 2024-07-17 ENCOUNTER — Other Ambulatory Visit: Payer: Self-pay | Admitting: Family Medicine

## 2024-07-17 NOTE — Telephone Encounter (Signed)
 Name of Medication: diazepam  (VALIUM ) 10 MG tablet  Name of Pharmacy: [facility] Last Fill or Written Date and Quantity: 04/19/24 Last Office Visit and Type: 06/07/24 Next Office Visit and Type: Nothing scheduled Last Controlled Substance Agreement Date:  Last UDS:

## 2024-07-17 NOTE — Telephone Encounter (Signed)
Name of Medication:  Name of Pharmacy:  Last Fill or Written Date and Quantity:  Last Office Visit and Type:  Next Office Visit and Type:  Last Controlled Substance Agreement Date:  Last UDS:   

## 2024-08-27 ENCOUNTER — Other Ambulatory Visit: Payer: Self-pay | Admitting: Family Medicine

## 2024-08-27 NOTE — Telephone Encounter (Signed)
 Last written 06-26-24 #90/1 Last OV 06-07-24 No Future OV [facility]  Tried to call pt and no answer and no VM. Called Mobile # which was actually her husband's number. But. According to the Active FYIs, he is not on DPR so I could not leave a message.   We need to know if she actually needs the refill or is it on automatic refill request at the pharmacy. Is she really taking 3 a day everyday?  Please provide message from provider/office when call is returned from patient.

## 2024-08-28 NOTE — Telephone Encounter (Signed)
 Copied from CRM #[id number]. Topic: Clinical - Medication Question >> Aug 27, 2024  4:40 PM Drema MATSU wrote: Reason for CRM: Patient is returning a call from [place]. Pt requested refill for butalbital -acetaminophen -caffeine  (FIORICET) 50-325-40 MG tablet and she stated that she takes 3 a day everyday only when needed.
